# Patient Record
Sex: Female | Born: 1970 | Race: Black or African American | Hispanic: No | Marital: Single | Smoking: Former smoker
Health system: Southern US, Community
[De-identification: ages and names within clinical notes are randomized; demographics above are authoritative.]

## PROBLEM LIST (undated history)

## (undated) DIAGNOSIS — E559 Vitamin D deficiency, unspecified: Secondary | ICD-10-CM

## (undated) DIAGNOSIS — D250 Submucous leiomyoma of uterus: Secondary | ICD-10-CM

## (undated) DIAGNOSIS — E039 Hypothyroidism, unspecified: Secondary | ICD-10-CM

## (undated) DIAGNOSIS — D509 Iron deficiency anemia, unspecified: Secondary | ICD-10-CM

## (undated) DIAGNOSIS — M549 Dorsalgia, unspecified: Secondary | ICD-10-CM

## (undated) DIAGNOSIS — R030 Elevated blood-pressure reading, without diagnosis of hypertension: Secondary | ICD-10-CM

## (undated) DIAGNOSIS — F419 Anxiety disorder, unspecified: Secondary | ICD-10-CM

## (undated) DIAGNOSIS — Z9289 Personal history of other medical treatment: Secondary | ICD-10-CM

## (undated) DIAGNOSIS — E538 Deficiency of other specified B group vitamins: Secondary | ICD-10-CM

## (undated) DIAGNOSIS — M1990 Unspecified osteoarthritis, unspecified site: Secondary | ICD-10-CM

## (undated) DIAGNOSIS — D649 Anemia, unspecified: Secondary | ICD-10-CM

## (undated) HISTORY — DX: Vitamin D deficiency, unspecified: E55.9

## (undated) HISTORY — PX: WISDOM TOOTH EXTRACTION: SHX21

## (undated) HISTORY — DX: Iron deficiency anemia, unspecified: D50.9

## (undated) HISTORY — DX: Anemia, unspecified: D64.9

## (undated) HISTORY — DX: Deficiency of other specified B group vitamins: E53.8

## (undated) HISTORY — DX: Dorsalgia, unspecified: M54.9

## (undated) HISTORY — DX: Elevated blood-pressure reading, without diagnosis of hypertension: R03.0

## (undated) HISTORY — DX: Hypothyroidism, unspecified: E03.9

## (undated) HISTORY — DX: Unspecified osteoarthritis, unspecified site: M19.90

## (undated) HISTORY — PX: APPENDECTOMY: SHX54

## (undated) HISTORY — DX: Submucous leiomyoma of uterus: D25.0

---

## 1998-03-10 ENCOUNTER — Emergency Department (HOSPITAL_COMMUNITY): Admission: EM | Admit: 1998-03-10 | Discharge: 1998-03-10 | Payer: Self-pay | Admitting: Emergency Medicine

## 1999-05-28 ENCOUNTER — Emergency Department (HOSPITAL_COMMUNITY): Admission: EM | Admit: 1999-05-28 | Discharge: 1999-05-28 | Payer: Self-pay | Admitting: Emergency Medicine

## 1999-09-02 ENCOUNTER — Emergency Department (HOSPITAL_COMMUNITY): Admission: EM | Admit: 1999-09-02 | Discharge: 1999-09-02 | Payer: Self-pay | Admitting: Emergency Medicine

## 1999-09-02 ENCOUNTER — Encounter: Payer: Self-pay | Admitting: Emergency Medicine

## 1999-11-21 ENCOUNTER — Encounter (INDEPENDENT_AMBULATORY_CARE_PROVIDER_SITE_OTHER): Payer: Self-pay | Admitting: Specialist

## 1999-11-21 ENCOUNTER — Ambulatory Visit (HOSPITAL_COMMUNITY): Admission: AD | Admit: 1999-11-21 | Discharge: 1999-11-21 | Payer: Self-pay | Admitting: *Deleted

## 1999-12-14 ENCOUNTER — Other Ambulatory Visit: Admission: RE | Admit: 1999-12-14 | Discharge: 1999-12-14 | Payer: Self-pay | Admitting: *Deleted

## 2000-01-19 HISTORY — PX: TUBAL LIGATION: SHX77

## 2000-03-03 ENCOUNTER — Emergency Department (HOSPITAL_COMMUNITY): Admission: EM | Admit: 2000-03-03 | Discharge: 2000-03-03 | Payer: Self-pay

## 2000-04-18 ENCOUNTER — Other Ambulatory Visit: Admission: RE | Admit: 2000-04-18 | Discharge: 2000-04-18 | Payer: Self-pay | Admitting: Obstetrics and Gynecology

## 2000-05-24 ENCOUNTER — Inpatient Hospital Stay (HOSPITAL_COMMUNITY): Admission: AD | Admit: 2000-05-24 | Discharge: 2000-05-24 | Payer: Self-pay | Admitting: Obstetrics and Gynecology

## 2000-06-29 ENCOUNTER — Inpatient Hospital Stay (HOSPITAL_COMMUNITY): Admission: AD | Admit: 2000-06-29 | Discharge: 2000-06-29 | Payer: Self-pay | Admitting: Obstetrics and Gynecology

## 2000-07-18 ENCOUNTER — Ambulatory Visit (HOSPITAL_COMMUNITY): Admission: RE | Admit: 2000-07-18 | Discharge: 2000-07-18 | Payer: Self-pay | Admitting: Obstetrics and Gynecology

## 2000-07-18 ENCOUNTER — Encounter: Payer: Self-pay | Admitting: Obstetrics and Gynecology

## 2000-09-23 ENCOUNTER — Inpatient Hospital Stay (HOSPITAL_COMMUNITY): Admission: AD | Admit: 2000-09-23 | Discharge: 2000-09-23 | Payer: Self-pay | Admitting: Obstetrics and Gynecology

## 2000-11-16 ENCOUNTER — Inpatient Hospital Stay (HOSPITAL_COMMUNITY): Admission: AD | Admit: 2000-11-16 | Discharge: 2000-11-16 | Payer: Self-pay | Admitting: Obstetrics and Gynecology

## 2000-11-23 ENCOUNTER — Inpatient Hospital Stay (HOSPITAL_COMMUNITY): Admission: AD | Admit: 2000-11-23 | Discharge: 2000-11-26 | Payer: Self-pay | Admitting: Obstetrics and Gynecology

## 2000-11-23 ENCOUNTER — Encounter (INDEPENDENT_AMBULATORY_CARE_PROVIDER_SITE_OTHER): Payer: Self-pay | Admitting: Specialist

## 2000-11-28 ENCOUNTER — Inpatient Hospital Stay (HOSPITAL_COMMUNITY): Admission: AD | Admit: 2000-11-28 | Discharge: 2000-12-01 | Payer: Self-pay | Admitting: Obstetrics and Gynecology

## 2000-11-28 ENCOUNTER — Encounter: Payer: Self-pay | Admitting: Obstetrics and Gynecology

## 2000-12-01 ENCOUNTER — Encounter: Payer: Self-pay | Admitting: Obstetrics and Gynecology

## 2000-12-19 ENCOUNTER — Encounter: Payer: Self-pay | Admitting: Obstetrics and Gynecology

## 2000-12-19 ENCOUNTER — Ambulatory Visit (HOSPITAL_COMMUNITY): Admission: RE | Admit: 2000-12-19 | Discharge: 2000-12-19 | Payer: Self-pay | Admitting: Obstetrics and Gynecology

## 2001-02-21 ENCOUNTER — Encounter: Payer: Self-pay | Admitting: Obstetrics and Gynecology

## 2001-02-21 ENCOUNTER — Ambulatory Visit (HOSPITAL_COMMUNITY): Admission: RE | Admit: 2001-02-21 | Discharge: 2001-02-21 | Payer: Self-pay | Admitting: Obstetrics and Gynecology

## 2001-08-29 ENCOUNTER — Other Ambulatory Visit: Admission: RE | Admit: 2001-08-29 | Discharge: 2001-08-29 | Payer: Self-pay | Admitting: Obstetrics and Gynecology

## 2002-01-21 ENCOUNTER — Emergency Department (HOSPITAL_COMMUNITY): Admission: EM | Admit: 2002-01-21 | Discharge: 2002-01-21 | Payer: Self-pay | Admitting: Emergency Medicine

## 2002-01-21 ENCOUNTER — Encounter: Payer: Self-pay | Admitting: Emergency Medicine

## 2002-06-05 ENCOUNTER — Emergency Department (HOSPITAL_COMMUNITY): Admission: EM | Admit: 2002-06-05 | Discharge: 2002-06-05 | Payer: Self-pay | Admitting: Emergency Medicine

## 2002-09-05 ENCOUNTER — Other Ambulatory Visit: Admission: RE | Admit: 2002-09-05 | Discharge: 2002-09-05 | Payer: Self-pay | Admitting: Obstetrics and Gynecology

## 2002-11-25 ENCOUNTER — Emergency Department (HOSPITAL_COMMUNITY): Admission: EM | Admit: 2002-11-25 | Discharge: 2002-11-25 | Payer: Self-pay | Admitting: Emergency Medicine

## 2003-01-24 ENCOUNTER — Emergency Department (HOSPITAL_COMMUNITY): Admission: AD | Admit: 2003-01-24 | Discharge: 2003-01-24 | Payer: Self-pay | Admitting: Family Medicine

## 2003-10-23 ENCOUNTER — Emergency Department (HOSPITAL_COMMUNITY): Admission: EM | Admit: 2003-10-23 | Discharge: 2003-10-23 | Payer: Self-pay

## 2004-02-03 ENCOUNTER — Other Ambulatory Visit: Admission: RE | Admit: 2004-02-03 | Discharge: 2004-02-03 | Payer: Self-pay | Admitting: Obstetrics and Gynecology

## 2004-02-17 ENCOUNTER — Emergency Department (HOSPITAL_COMMUNITY): Admission: EM | Admit: 2004-02-17 | Discharge: 2004-02-17 | Payer: Self-pay | Admitting: Family Medicine

## 2005-03-01 ENCOUNTER — Emergency Department (HOSPITAL_COMMUNITY): Admission: EM | Admit: 2005-03-01 | Discharge: 2005-03-01 | Payer: Self-pay | Admitting: Family Medicine

## 2005-06-30 ENCOUNTER — Encounter: Payer: Self-pay | Admitting: Obstetrics and Gynecology

## 2005-06-30 ENCOUNTER — Ambulatory Visit: Payer: Self-pay | Admitting: Obstetrics and Gynecology

## 2006-09-03 ENCOUNTER — Emergency Department (HOSPITAL_COMMUNITY): Admission: EM | Admit: 2006-09-03 | Discharge: 2006-09-03 | Payer: Self-pay | Admitting: Family Medicine

## 2007-05-08 ENCOUNTER — Emergency Department (HOSPITAL_COMMUNITY): Admission: EM | Admit: 2007-05-08 | Discharge: 2007-05-08 | Payer: Self-pay | Admitting: Emergency Medicine

## 2007-09-13 ENCOUNTER — Emergency Department (HOSPITAL_COMMUNITY): Admission: EM | Admit: 2007-09-13 | Discharge: 2007-09-13 | Payer: Self-pay | Admitting: Emergency Medicine

## 2008-04-17 ENCOUNTER — Emergency Department (HOSPITAL_COMMUNITY): Admission: EM | Admit: 2008-04-17 | Discharge: 2008-04-17 | Payer: Self-pay | Admitting: Family Medicine

## 2008-04-18 ENCOUNTER — Emergency Department (HOSPITAL_COMMUNITY): Admission: EM | Admit: 2008-04-18 | Discharge: 2008-04-19 | Payer: Self-pay | Admitting: Emergency Medicine

## 2008-04-22 ENCOUNTER — Ambulatory Visit (HOSPITAL_COMMUNITY): Admission: AD | Admit: 2008-04-22 | Discharge: 2008-04-22 | Payer: Self-pay | Admitting: Otolaryngology

## 2009-01-27 ENCOUNTER — Emergency Department (HOSPITAL_COMMUNITY): Admission: EM | Admit: 2009-01-27 | Discharge: 2009-01-28 | Payer: Self-pay | Admitting: Emergency Medicine

## 2009-02-26 ENCOUNTER — Ambulatory Visit: Payer: Self-pay | Admitting: Obstetrics and Gynecology

## 2009-03-03 ENCOUNTER — Encounter: Admission: RE | Admit: 2009-03-03 | Discharge: 2009-03-03 | Payer: Self-pay | Admitting: Obstetrics & Gynecology

## 2009-10-09 ENCOUNTER — Emergency Department (HOSPITAL_COMMUNITY): Admission: EM | Admit: 2009-10-09 | Discharge: 2009-10-09 | Payer: Self-pay | Admitting: Emergency Medicine

## 2009-10-11 ENCOUNTER — Emergency Department (HOSPITAL_COMMUNITY): Admission: EM | Admit: 2009-10-11 | Discharge: 2009-10-11 | Payer: Self-pay | Admitting: Emergency Medicine

## 2009-11-20 ENCOUNTER — Emergency Department (HOSPITAL_COMMUNITY): Admission: EM | Admit: 2009-11-20 | Discharge: 2009-11-20 | Payer: Self-pay | Admitting: Family Medicine

## 2009-12-13 ENCOUNTER — Emergency Department (HOSPITAL_COMMUNITY): Admission: EM | Admit: 2009-12-13 | Discharge: 2009-12-13 | Payer: Self-pay | Admitting: Emergency Medicine

## 2009-12-25 ENCOUNTER — Inpatient Hospital Stay (HOSPITAL_COMMUNITY): Admission: AD | Admit: 2009-12-25 | Discharge: 2009-09-20 | Payer: Self-pay | Admitting: Obstetrics and Gynecology

## 2010-03-31 LAB — CULTURE, ROUTINE-ABSCESS

## 2010-04-02 LAB — CBC
Hemoglobin: 9.7 g/dL — ABNORMAL LOW (ref 12.0–15.0)
MCV: 73.2 fL — ABNORMAL LOW (ref 78.0–100.0)
Platelets: 216 10*3/uL (ref 150–400)
WBC: 5.2 10*3/uL (ref 4.0–10.5)

## 2010-04-02 LAB — URINALYSIS, ROUTINE W REFLEX MICROSCOPIC
Glucose, UA: NEGATIVE mg/dL
Hgb urine dipstick: NEGATIVE
Nitrite: NEGATIVE
Protein, ur: NEGATIVE mg/dL
Specific Gravity, Urine: >1.03 — ABNORMAL HIGH (ref 1.005–1.030)
pH: 6 (ref 5.0–8.0)

## 2010-04-02 LAB — CULTURE, ROUTINE-ABSCESS

## 2010-04-02 LAB — WET PREP, GENITAL: Yeast Wet Prep HPF POC: NONE SEEN

## 2010-04-02 LAB — URINE MICROSCOPIC-ADD ON

## 2010-04-02 LAB — GC/CHLAMYDIA PROBE AMP, GENITAL
Chlamydia, DNA Probe: NEGATIVE
GC Probe Amp, Genital: NEGATIVE

## 2010-04-05 LAB — CBC
Platelets: 239 10*3/uL (ref 150–400)
RBC: 4.72 MIL/uL (ref 3.87–5.11)
RDW: 13.3 % (ref 11.5–15.5)
WBC: 6.9 10*3/uL (ref 4.0–10.5)

## 2010-04-05 LAB — COMPREHENSIVE METABOLIC PANEL
Albumin: 3.7 g/dL (ref 3.5–5.2)
Alkaline Phosphatase: 90 U/L (ref 39–117)
CO2: 23 mEq/L (ref 19–32)
Calcium: 8.8 mg/dL (ref 8.4–10.5)
Creatinine, Ser: 0.5 mg/dL (ref 0.4–1.2)
GFR calc Af Amer: >60 mL/min (ref >60)
GFR calc non Af Amer: >60 mL/min (ref >60)
Potassium: 3.7 mEq/L (ref 3.5–5.1)
Sodium: 136 mEq/L (ref 135–145)

## 2010-04-05 LAB — URINALYSIS, ROUTINE W REFLEX MICROSCOPIC
Glucose, UA: NEGATIVE mg/dL
Protein, ur: NEGATIVE mg/dL
Urobilinogen, UA: 0.2 mg/dL (ref 0.0–1.0)

## 2010-04-05 LAB — PREGNANCY, URINE: Preg Test, Ur: NEGATIVE

## 2010-04-05 LAB — DIFFERENTIAL
Basophils Absolute: 0 10*3/uL (ref 0.0–0.1)
Basophils Relative: 0 % (ref 0–1)
Eosinophils Absolute: 0 10*3/uL (ref 0.0–0.7)
Lymphocytes Relative: 6 % — ABNORMAL LOW (ref 12–46)
Monocytes Relative: 5 % (ref 3–12)
Neutrophils Relative %: 89 % — ABNORMAL HIGH (ref 43–77)

## 2010-04-29 LAB — CBC
HCT: 38.8 % (ref 36.0–46.0)
Hemoglobin: 12.3 g/dL (ref 12.0–15.0)
MCHC: 34.7 g/dL (ref 30.0–36.0)
MCV: 76.6 fL — ABNORMAL LOW (ref 78.0–100.0)
Platelets: 234 10*3/uL (ref 150–400)
RDW: 12.6 % (ref 11.5–15.5)
RDW: 13.1 % (ref 11.5–15.5)
WBC: 14.6 10*3/uL — ABNORMAL HIGH (ref 4.0–10.5)

## 2010-04-29 LAB — POCT I-STAT, CHEM 8
Calcium, Ion: 1.1 mmol/L — ABNORMAL LOW (ref 1.12–1.32)
Glucose, Bld: 111 mg/dL — ABNORMAL HIGH (ref 70–99)
HCT: 40 % (ref 36.0–46.0)
Hemoglobin: 13.6 g/dL (ref 12.0–15.0)
Potassium: 3.1 mEq/L — ABNORMAL LOW (ref 3.5–5.1)
Sodium: 137 mEq/L (ref 135–145)

## 2010-04-29 LAB — DIFFERENTIAL
Basophils Relative: 0 % (ref 0–1)
Eosinophils Absolute: 0 10*3/uL (ref 0.0–0.7)
Lymphocytes Relative: 7 % — ABNORMAL LOW (ref 12–46)
Monocytes Relative: 17 % — ABNORMAL HIGH (ref 3–12)
Neutro Abs: 11.2 10*3/uL — ABNORMAL HIGH (ref 1.7–7.7)

## 2010-04-30 LAB — POCT RAPID STREP A (OFFICE): Streptococcus, Group A Screen (Direct): POSITIVE — AB

## 2010-06-02 NOTE — Op Note (Signed)
[name], [name]               ACCOUNT NO.:  [id number]   MEDICAL RECORD NO.:  [id number]          PATIENT TYPE:  AMB   LOCATION:  SDS                          FACILITY:  [facility]   PHYSICIAN:  Suzanna Obey, M.D.       DATE OF BIRTH:  1970/12/15   DATE OF PROCEDURE:  04/22/2008  DATE OF DISCHARGE:                               OPERATIVE REPORT   PREOPERATIVE DIAGNOSIS:  Right peritonsillar abscess.   POSTOPERATIVE DIAGNOSIS:  Right peritonsillar abscess.   SURGICAL PROCEDURE:  Incision and drainage of right peritonsillar  abscess.   ANESTHESIA:  General.   ESTIMATED BLOOD LOSS:  Approximately 10 mL.   INDICATIONS FOR PROCEDURE:  This is a 40 year old who has had about a  week history of sore throat, has been refractory to medical therapy.  She has been on Augmentin and still having significant issues with  discomfort.  She was attempted to have this drained in the office, but  her gag reflux was far too sensitive and had to be aborted and brought  to the operating room.  She was informed of the risks and benefits of  the procedure and options were discussed.  All questions were answered  and consent was obtained.   OPERATION:  The patient was taken to the operating room and placed in  supine position.  After general endotracheal tube anesthesia, was placed  in the Rose position, draped in the usual sterile manner.  A Crowe-Davis  mouth gag was inserted, retracted, and suspended from the Mayo stand.  The right tonsil had a fullness and was cut right at the upper portion  of the tonsil and the peritonsillar space with electrocautery and then  opened with a tonsillar hemostat.  A large amount of pus was expressed.  The wound was opened with the tonsillar hemostat and irrigated with  saline that was seemed to be completely decompressed.  The hypopharynx,  esophagus, and stomach were suctioned with an NG tube.  The Crowe-Davis  was released and the patient was awake and brought to  recovery room in  stable condition.  Counts were correct.           ______________________________  Suzanna Obey, M.D.     JB/MEDQ  D:  04/22/2008  T:  04/23/2008  Job:  [id number]

## 2010-06-05 NOTE — Group Therapy Note (Signed)
NAMEHAYLO, [name]               ACCOUNT NO.:  [id number]   MEDICAL RECORD NO.:  [id number]          PATIENT TYPE:  WOC   LOCATION:  [facility]                   FACILITY:  [facility]   PHYSICIAN:  Argentina Donovan, MD        DATE OF BIRTH:  05/13/1970   DATE OF SERVICE:                                    CLINIC NOTE   Patient is a 40 year old black female, gravida 3, para 2, 0-1-2, who has no  significant physical problems, is in good health, and is in purely for an  annual Pap smear.  All of her Pap smears in the past have been fine.   In her history, she has had appendicitis, two C-sections, and tubal  ligation.   PHYSICAL EXAMINATION:  ABDOMEN:  On examination, the abdomen is soft, flat,  and nontender.  No masses or organomegaly.  PELVIC:  External genitalia are normal.  BUS within normal limits.  The  vagina is clear and well rugated, pink.  The cervix is clean and  nulliparous.  The uterus is anterior and normal in size, shape, and  consistency.  Adnexa is normal.   A Pap smear was taken.   Patient has no family history of breast cancer.           ______________________________  Argentina Donovan, MD     PR/MEDQ  D:  06/30/2005  T:  06/30/2005  Job:  [id number]

## 2010-06-05 NOTE — Op Note (Signed)
[facility]  PatientNEIDA, ELLEGOOD Visit Number: [id number] MRN: [id number]          Service Type: OBS Location: 910A 9148 01 Attending Physician:  Leonard Schwartz Dictated by:   Janine Limbo, M.D. Proc. Date: 11/23/00 Admit Date:  11/23/2000                             Operative Report  PREOPERATIVE DIAGNOSES:       1. Term intrauterine pregnancy.                               2. Prior cesarean section.                               3. Desires repeat cesarean section.                               4. Desires permanent sterilization.  POSTOPERATIVE DIAGNOSES:      1. Term intrauterine pregnancy.                               2. Prior cesarean section.                               3. Desires repeat cesarean section.                               4. Desires permanent sterilization.                               5. Homero Fellers breech presentation.  PROCEDURE:                    Repeat low transverse cesarean section, bilateral tubal ligation.  SURGEON:                      Janine Limbo, M.D.  FIRST ASSISTANT:              Mack Guise, C.N.M.  ANESTHESIA:                   Spinal.  DISPOSITION:                  Ms. Cerino is a 40 year old female gravida 3, para 1-0-1-1 who presents at [redacted] weeks gestation [facility] December 08, 2000) for repeat cesarean section.  The patient understands the indications for her surgical procedure and she accepts the risks of, but not limited to, anesthetic complications, bleeding, infections, possible damage to the surrounding organs, and possible tubal failure (10-17 per 1000).  FINDINGS:                     A 7 pound 10 ounce female infant Ninfa Linden) was delivered from a complete breech presentation.  The Apgars were 7 at one minute and 9 at five minutes.  The uterus, fallopian tubes, and ovaries were normal for the gravid state.  PROCEDURE:  The patient was taken to the  operating room where a spinal anesthetic was given.  The patients abdomen and perineum were prepped with multiple layers of Betadine.  A Foley catheter was placed in the bladder.  The patient was sterilely draped.  A low transverse incision was made in the abdomen and carried sharply through the subcutaneous tissue, the fascia, and the anterior peritoneum.  An incision was made in the lower uterine segment and extended transversely.  The infant was delivered from a complete breech extraction without difficulty.  The mouth and nose were suctioned.  The cord was clamped and cut and the infant was handed to the awaiting pediatric team.  Routine cord blood studies were obtained.  The placenta was removed.  The uterine cavity was cleaned of amniotic fluid, clotted blood, and membranes.  The uterine incision was closed using a running locking suture of 2-0 Vicryl followed by figure-of-eight sutures of 2-0 Vicryl for hemostasis.  Hemostasis was adequate.  The pelvic cavity was vigorously irrigated.  The left fallopian tube was identified and followed to its fimbriated end.  A knuckle of tube was made on the left using a free tie and then a tied suture ligature of 0 plain catgut.  The knuckle of tube thus made was excised.  Hemostasis was adequate.  An identical procedure was carried out on the opposite side.  Again, hemostasis was adequate.  The anterior peritoneum and the abdominal musculature were then reapproximated in the midline using interrupted sutures of 2-0 Vicryl.  The fascia was closed using a running suture of 0 Vicryl followed by three interrupted sutures of 0 Vicryl.  The subcutaneous layer was irrigated.  Hemostasis was adequate.  The subcutaneous layer was closed using interrupted sutures of 0 Vicryl and 2-0 Vicryl.  The skin was reapproximated using skin staples.  Sponge, needle, and instrument counts were correct on two occasions.  The estimated blood loss for the procedure was  700 cc.  The patient tolerated her procedure well.  The patient was taken to the recovery room in stable condition.  The infant was taken to the full-term nursery in stable condition. Dictated by:   Janine Limbo, M.D. Attending Physician:  Leonard Schwartz DD:  11/23/00 TD:  11/24/00 Job: [phone] [id number]

## 2010-06-05 NOTE — Op Note (Signed)
[facility]  Patient:    [name], [name]                        MRN: [id number] Proc. Date: 11/21/99 Adm. Date:  [id number] Attending:  Pleas Koch                           Operative Report  PREOPERATIVE DIAGNOSES:       First trimester bleeding, missed abortion 6 weeks.  POSTOPERATIVE DIAGNOSES:      First trimester bleeding, missed abortion 6 weeks.  OPERATION PERFORMED:          Suction ______ and evacuation.  SURGEON:                      Georgina Peer, M.D.  ANESTHESIA:                   Monitored anesthesia care using 20 cc of 1% Xylocaine paracervical block.  ESTIMATED BLOOD LOSS:         Less than 25 cc.  COMPLICATIONS:                None.  FINDINGS:                     A 6 week anterior gravid uterus.  INDICATIONS:                  This is a 40 year old gravida 2, para 1 with a known plateauing hCGs, a uterus on ultrasound which had a 6 week sac with no pole.  Patient was scheduled for a D&E on November 5, however, began having bleeding and significant cramping on November 3 and presented to maternity admissions.  Beta hCG continued to plateau at 5300 and decision was made to proceed with D&E today.  DESCRIPTION OF PROCEDURE:     Patient was taken to the operating room.  She was given IV sedation, placed in the dorsal lithotomy position.  Prepped and draped in the normal sterile fashion.  Uterus was 6 weeks size, soft, and anterior but no adnexal masses.  Cervix was visualized with the speculum and injected with 20 cc of 1% Xylocaine plain in the paracervical fashion. Tenaculum was applied to the cervix and the cervix was progressively dilated to a 31 [place] with [employer] dilators.  A #10 curved suction curette was used to thoroughly evacuate the uterine cavity.  A small sharp curette confirmed that there was no residual tissue.  Bleeding was minimal.  Uterus was felt to normal size and well contracted.  Sponge, needle, and  instrument counts were correct at all times.  Patient was returned to the recovery area in stable condition. DD:  11/21/99 TD:  11/22/99 Job: [id number]

## 2010-06-05 NOTE — Discharge Summary (Signed)
[name]  PatientTRIVIA, [name] Visit Number: [id number] MRN: [id number]          Service Type: OBS Location: 910A 9148 01 Attending Physician:  Leonard Schwartz Dictated by:   Wynelle Bourgeois, CNM Admit Date:  11/23/2000 Discharge Date: 11/26/2000                             Discharge Summary  ADMISSION DIAGNOSES:          1. Term pregnancy.                               2. Previous cesarean section, desires repeat.                               3. Desires sterilization.                               4. Breech.  DISCHARGE DIAGNOSES:          1. Term pregnancy.                               2. Previous cesarean section, desires repeat.                               3. Desires sterilization.                               4. Breech.                               5. Status post repeat low transverse cesarean                                  section and bilateral tubal ligation for a                                  viable female infant named Ahmad,                                  7 pounds 10 ounces, Apgars 7 and 9.  HOSPITAL PROCEDURES:          1. Spinal anesthesia.                               2. Repeat low transverse cesarean section and                                  bilateral tubal ligation.  HOSPITAL COURSE:              The patient was admitted on November 23, 2000 for an elective repeat low transverse cesarean section and elective bilateral tubal ligation.  This procedure was performed  by Dr. Stefano Gaul and proceeded without complications with an EBL of 700 cc and infant was stable upon delivery and remained so throughout the hospital stay.  On postop day #1, the patient was doing well, ambulating without assistance, tolerating liquids, and passing flatus.  She was breast feeding well.  Vital signs were stable and she was afebrile.  Hemoglobin was 9.9 (previously 11.3).  Chest was clear.  Her heart rate was regular rate and  rhythm.  Breasts were soft and nontender. Abdomen was soft and appropriately tender.  Incision was clean, dry, and intact.  Extremities within normal limits and routine care was given.  Postop day #2, the patient remained stable and routine postop care continued.  There was a small amount of serosanguineous drainage from the left side of her incision but the incision was healing.  There was no evidence of induration or cellulitis noted.  Staples were intact.  On postop day #3, she was doing well. Her vital signs were stable.  Maximum temperature was 98.8.  Blood pressure 120/60.  Physical exam was within normal limits.  Her incision was clean and dry with no serosanguineous drainage noted and no erythema or induration of her incision was noted.  Lochia was small.  Extremities had trace to 1+ edema. She was breast feeding well and was deemed to have received the full benefit of her hospital stay and was discharged home.  DISCHARGE MEDICATIONS:        1. Motrin 600 mg p.o. q.6h. p.r.n.                               2. Tylox 1-2 p.o. q.3-4h. p.r.n.  DISCHARGE LABORATORY DATA:    White blood cell count 10.1, hemoglobin 9.9, hematocrit 27.4, platelets 175,000.  RPR nonreactive.  DISCHARGE INSTRUCTIONS:       Per [facility] handout.  DISCHARGE FOLLOWUP:           On Thursday or Friday of the coming week for staple removal and then at six weeks postpartum or p.r.n. as indicated. Dictated by:   Wynelle Bourgeois, CNM Attending Physician:  Leonard Schwartz DD:  11/26/00 TD:  11/28/00 Job: [id number]

## 2010-06-05 NOTE — Discharge Summary (Signed)
[facility]  PatientMARLIA, [name] Visit Number: [id number] MRN: [id number]          Service Type: MED Location: 9300 9325 [id number] Attending Physician:  Leonard Schwartz Dictated by:   Nigel Bridgeman, C.N.M. Admit Date:  11/28/2000 Discharge Date: 12/01/2000                             Discharge Summary  ADDENDUM  Prior to the patients discharge she had a re-evaluation of her temperature which was 100.5.  The decision was made to cancel her discharge and continue her admission for another 23 hours, and she will be re-evaluated at that time. Dictated by:   Nigel Bridgeman, C.N.M. Attending Physician:  Leonard Schwartz DD:  12/01/00 TD:  12/01/00 Job: [id number]
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
## 2010-06-05 NOTE — Discharge Summary (Signed)
[facility] of [facility]  PatientOMAYA, [name] Visit Number: [id number] MRN: [id number]          Service Type: MED Location: 9300 9325 01 Attending Physician:  Leonard Schwartz Dictated by:   Nigel Bridgeman, C.N.M. Admit Date:  11/28/2000 Disc. Date: 12/01/00                             Discharge Summary  ADMISSION DIAGNOSES: 1. Five days status post repeat low transverse cesarean section. 2. Fever of unknown origin. 3. Hematuria.  DISCHARGE DIAGNOSES: 1. Probable pneumonia. 2. Eight days postoperative cesarean section.  PROCEDURES:  Intravenous antibiotic therapy.  HOSPITAL COURSE:  [name] is a 40 year old, gravida 3, para 2-0-1-2, who was admitted on 11/28/00, at 5 days postoperative cesarean section with complaints of worsening shortness of breath, vomiting, dysuria, and chest pain.  Her pregnancy had been remarkable for repeat cesarean section on November 23, 2000, with tubal ligation, persistent nausea and vomiting with several MAU visits during the pregnancy, obesity.  On admission, CBC showed white blood cell count at 6.7, hemoglobin 9.6.  Urine showed 11 to 20 red blood cells, but no evidence of pyelonephritis.  Temperature on admission was 102.7, pulse 102, blood pressure was normal, respiratory rate 24, O2 saturation was 97% on room air.  There were decreased sounds in the lower lobes of her lungs.  Her incision was clean, dry, and intact.  She had mild to moderate left CVA tenderness.  She had greater than 80 ketones on a UA.  SGOT was 48, SGPT was 35.  The decision was made to admit her to third floor for 23 hour observation with orders for IV hydration and Unasyn 3 g IV.  Chest x-ray was also done which showed findings suggestive of mild interstitial edema. She was placed on Lasix and strict I&O with daily weights.  Over the next three days she lost approximately 10 pounds.  Her clinical status improved. Her shortness of breath  resolved.  She did have another spike of temperature early in the morning of hospital day #3.  Chest x-ray was repeated on 12/01/00, showing findings more suggestive of right lower lobe infiltrate with suspicion for pneumonia.  However, in light of her improving clinical status, her lack of fever for greater than 24 hours, and her weight loss, the decision was made to discharge her home.  This was in consult with Dr. Normand Sloop.  The decision was also made to remove her staples today.  She was given Rocephin 1 g IV, and placed on a Zithromax DosePak of 500 mg q.d. x 7 days.  She was then deemed to have received full benefit of her hospital stay and was discharged home.  DISCHARGE INSTRUCTIONS: 1. The patient is to increase her rest. 2. She is to push p.o. fluids. 3. She is to call for temperature greater than or equal to 100, shortness of    breath, or chest pain.  She is also asked to call for any heavy vaginal    bleeding or any kind of discharge or bleeding from her wound.  DISCHARGE MEDICATIONS:  Zithromax one tablet 500 mg p.o. q.d. x 7 days.  FOLLOWUP:  In one week on 12/08/00 at 11:15 a.m. with ______ followup. Dictated by:   Nigel Bridgeman, C.N.M. Attending Physician:  Leonard Schwartz DD:  12/01/00 TD:  12/01/00 Job: [id number]
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
## 2010-10-30 LAB — POCT RAPID STREP A: Streptococcus, Group A Screen (Direct): NEGATIVE

## 2011-02-19 DIAGNOSIS — Z9289 Personal history of other medical treatment: Secondary | ICD-10-CM

## 2011-02-19 HISTORY — DX: Personal history of other medical treatment: Z92.89

## 2011-02-21 ENCOUNTER — Emergency Department (HOSPITAL_COMMUNITY): Payer: Medicaid Other

## 2011-02-21 ENCOUNTER — Observation Stay (HOSPITAL_COMMUNITY): Payer: Medicaid Other

## 2011-02-21 ENCOUNTER — Other Ambulatory Visit: Payer: Self-pay

## 2011-02-21 ENCOUNTER — Encounter (HOSPITAL_COMMUNITY): Payer: Self-pay

## 2011-02-21 ENCOUNTER — Inpatient Hospital Stay (HOSPITAL_COMMUNITY)
Admission: EM | Admit: 2011-02-21 | Discharge: 2011-02-22 | DRG: 812 | Disposition: A | Discharge status: Home / Self Care | Payer: Medicaid Other | Attending: Internal Medicine | Admitting: Internal Medicine

## 2011-02-21 DIAGNOSIS — R0602 Shortness of breath: Secondary | ICD-10-CM

## 2011-02-21 DIAGNOSIS — R531 Weakness: Secondary | ICD-10-CM

## 2011-02-21 DIAGNOSIS — D509 Iron deficiency anemia, unspecified: Secondary | ICD-10-CM

## 2011-02-21 DIAGNOSIS — D62 Acute posthemorrhagic anemia: Principal | ICD-10-CM | POA: Diagnosis present

## 2011-02-21 DIAGNOSIS — D250 Submucous leiomyoma of uterus: Secondary | ICD-10-CM | POA: Diagnosis present

## 2011-02-21 DIAGNOSIS — N920 Excessive and frequent menstruation with regular cycle: Secondary | ICD-10-CM | POA: Diagnosis present

## 2011-02-21 LAB — URINALYSIS, ROUTINE W REFLEX MICROSCOPIC
Ketones, ur: NEGATIVE mg/dL
Leukocytes, UA: NEGATIVE
Nitrite: NEGATIVE
Specific Gravity, Urine: 1.028 (ref 1.005–1.030)
Urobilinogen, UA: 2 mg/dL — ABNORMAL HIGH (ref 0.0–1.0)
pH: 6.5 (ref 5.0–8.0)

## 2011-02-21 LAB — CBC
Hemoglobin: 6.1 g/dL — CL (ref 12.0–15.0)
RBC: 3.8 MIL/uL — ABNORMAL LOW (ref 3.87–5.11)

## 2011-02-21 LAB — DIFFERENTIAL
Basophils Relative: 2 % — ABNORMAL HIGH (ref 0–1)
Eosinophils Absolute: 0.2 10*3/uL (ref 0.0–0.7)
Eosinophils Relative: 4 % (ref 0–5)
Lymphocytes Relative: 43 % (ref 12–46)
Neutrophils Relative %: 39 % — ABNORMAL LOW (ref 43–77)

## 2011-02-21 LAB — GLUCOSE, CAPILLARY: Glucose-Capillary: 77 mg/dL (ref 70–99)

## 2011-02-21 LAB — BASIC METABOLIC PANEL
GFR calc Af Amer: >90 mL/min (ref >90)
GFR calc non Af Amer: >90 mL/min (ref >90)
Potassium: 3.6 mEq/L (ref 3.5–5.1)
Sodium: 139 mEq/L (ref 135–145)

## 2011-02-21 LAB — HEMOGLOBIN AND HEMATOCRIT, BLOOD
Hemoglobin: 6.1 g/dL — CL (ref 12.0–15.0)
Hemoglobin: 10 g/dL — ABNORMAL LOW (ref 12.0–15.0)

## 2011-02-21 LAB — URINE MICROSCOPIC-ADD ON

## 2011-02-21 MED ORDER — ZOLPIDEM TARTRATE 5 MG PO TABS: 5.0000 mg | ORAL_TABLET | Freq: Every evening | ORAL | Status: DC | PRN

## 2011-02-21 MED ORDER — ACETAMINOPHEN 325 MG PO TABS: 650.0000 mg | ORAL_TABLET | Freq: Four times a day (QID) | ORAL | Status: DC | PRN

## 2011-02-21 MED ORDER — HYDROMORPHONE HCL PF 1 MG/ML IJ SOLN: 0.5000 mg | INTRAMUSCULAR | Status: DC | PRN

## 2011-02-21 MED ORDER — FUROSEMIDE 10 MG/ML IJ SOLN
20.0000 mg | Freq: Once | INTRAMUSCULAR | Status: AC
  Administered 2011-02-21: 20 mg via INTRAVENOUS
  Filled 2011-02-21 (×2): qty 2

## 2011-02-21 MED ORDER — OXYCODONE HCL 5 MG PO TABS: 5.0000 mg | ORAL_TABLET | ORAL | Status: DC | PRN

## 2011-02-21 MED ORDER — ONDANSETRON HCL 4 MG/2ML IJ SOLN: 4.0000 mg | Freq: Four times a day (QID) | INTRAMUSCULAR | Status: DC | PRN

## 2011-02-21 MED ORDER — IOHEXOL 300 MG/ML  SOLN
100.0000 mL | Freq: Once | INTRAMUSCULAR | Status: AC | PRN
  Administered 2011-02-21: 100 mL via INTRAVENOUS

## 2011-02-21 MED ORDER — IBUPROFEN 400 MG PO TABS
400.0000 mg | ORAL_TABLET | Freq: Once | ORAL | Status: AC
  Administered 2011-02-21: 400 mg via ORAL
  Filled 2011-02-21: qty 1

## 2011-02-21 MED ORDER — SODIUM CHLORIDE 0.9 % IV SOLN: INTRAVENOUS | Status: AC

## 2011-02-21 MED ORDER — IBUPROFEN 400 MG PO TABS
400.0000 mg | ORAL_TABLET | Freq: Four times a day (QID) | ORAL | Status: DC | PRN
  Filled 2011-02-21: qty 1

## 2011-02-21 MED ORDER — ACETAMINOPHEN 650 MG RE SUPP: 650.0000 mg | Freq: Four times a day (QID) | RECTAL | Status: DC | PRN

## 2011-02-21 MED ORDER — PANTOPRAZOLE SODIUM 40 MG IV SOLR
40.0000 mg | Freq: Two times a day (BID) | INTRAVENOUS | Status: DC
  Administered 2011-02-21: 40 mg via INTRAVENOUS
  Filled 2011-02-21 (×2): qty 40

## 2011-02-21 MED ORDER — ONDANSETRON HCL 4 MG PO TABS: 4.0000 mg | ORAL_TABLET | Freq: Four times a day (QID) | ORAL | Status: DC | PRN

## 2011-02-21 MED ORDER — SODIUM CHLORIDE 0.9 % IV SOLN: INTRAVENOUS | Status: DC

## 2011-02-21 MED ORDER — FUROSEMIDE 10 MG/ML IJ SOLN
20.0000 mg | Freq: Once | INTRAMUSCULAR | Status: AC
  Administered 2011-02-21: 20 mg via INTRAVENOUS
  Filled 2011-02-21: qty 2

## 2011-02-21 MED ORDER — FERROUS GLUCONATE 324 (38 FE) MG PO TABS
324.0000 mg | ORAL_TABLET | Freq: Every day | ORAL | Status: DC
  Administered 2011-02-22: 324 mg via ORAL
  Filled 2011-02-21 (×2): qty 1

## 2011-02-21 MED ORDER — PANTOPRAZOLE SODIUM 40 MG PO TBEC
40.0000 mg | DELAYED_RELEASE_TABLET | Freq: Every day | ORAL | Status: DC
  Administered 2011-02-22: 40 mg via ORAL
  Filled 2011-02-21: qty 1

## 2011-02-21 MED ORDER — ALUM & MAG HYDROXIDE-SIMETH 200-200-20 MG/5ML PO SUSP
30.0000 mL | Freq: Four times a day (QID) | ORAL | Status: DC | PRN
  Filled 2011-02-21: qty 30

## 2011-02-21 MED ORDER — FUROSEMIDE 10 MG/ML IJ SOLN
20.0000 mg | Freq: Once | INTRAMUSCULAR | Status: DC
  Filled 2011-02-21: qty 2

## 2011-02-21 MED ORDER — MEDROXYPROGESTERONE ACETATE 10 MG PO TABS
10.0000 mg | ORAL_TABLET | Freq: Every day | ORAL | Status: DC
  Administered 2011-02-21 – 2011-02-22 (×2): 10 mg via ORAL
  Filled 2011-02-21 (×2): qty 1

## 2011-02-21 MED ORDER — SODIUM CHLORIDE 0.9 % IV SOLN
125.0000 mg | Freq: Once | INTRAVENOUS | Status: AC
  Administered 2011-02-21: 125 mg via INTRAVENOUS
  Filled 2011-02-21: qty 10

## 2011-02-21 NOTE — Progress Notes (Signed)
[name]  UUV:[id number]  DOB: 06/05/1970  DOA: 02/21/2011  PCP: No primary provider on file.  Subjective: Complaining of generalized weakness, menorrhagia  Objective: Weight change:   Intake/Output Summary (Last 24 hours) at 02/21/11 1411 Last data filed at 02/21/11 1400  Gross per 24 hour  Intake   1480 ml  Output    700 ml  Net    780 ml   Blood pressure 130/80, pulse 72, temperature 98.5 F (36.9 C), temperature source Oral, resp. rate 18, height 5\' 5"  (1.651 m), weight 97.6 kg (215 lb 2.7 oz), last menstrual period 02/21/2011, SpO2 100.00%.  Physical Exam: General: Alert and awake, oriented x3, not in any acute distress. HEENT: anicteric sclera, pupils reactive to light and accommodation, EOMI CVS: S1-S2 clear, no murmur rubs or gallops Chest: clear to auscultation bilaterally, no wheezing, rales or rhonchi Abdomen: soft nontender, nondistended, normal bowel sounds, no organomegaly Extremities: no cyanosis, clubbing or edema noted bilaterally Neuro: Cranial nerves II-XII intact, no focal neurological deficits  Lab Results: Basic Metabolic Panel:  Lab 02/21/11 4742  NA 139  K 3.6  CL 107  CO2 21  GLUCOSE 95  BUN 17  CREATININE 0.49*  CALCIUM 8.9  MG --  PHOS --   CBC:  Lab 02/21/11 0536 02/21/11 0230  WBC -- 4.2  NEUTROABS -- 1.6*  HGB 6.1* 6.1*  HCT 21.2* 21.9*  MCV -- 57.6*  PLT -- 183  CBG:  Lab 02/21/11 1152 02/21/11 0749  GLUCAP 77 89     Micro Results: No results found for this or any previous visit (from the past 240 hour(s)).  Studies/Results: Dg Chest 2 View  02/21/2011  *RADIOLOGY REPORT*  Clinical Data: Chest pain, shortness of breath  CHEST - 2 VIEW  Comparison: 10/23/2003  Findings: Lungs are clear. No pleural effusion or pneumothorax. The cardiomediastinal contours are within normal limits. The visualized bones and soft tissues are without significant appreciable abnormality.  IMPRESSION: No acute cardiopulmonary process.   Original Report Authenticated By: Waneta Martins, M.D.   Ct Angio Chest W/cm &/or Wo Cm  02/21/2011  *RADIOLOGY REPORT*  Clinical Data: Weakness, fatigue  CT ANGIOGRAPHY CHEST  Technique:  Multidetector CT imaging of the chest using the standard protocol during bolus administration of intravenous contrast. Multiplanar reconstructed images including MIPs were obtained and reviewed to evaluate the vascular anatomy.  Contrast: OMNIPAQUE IOHEXOL 300 MG/ML IV SOLN  Comparison: 02/21/2011 radiograph  Findings: No pulmonary arterial branch filling defect.  Normal caliber aorta.  Normal heart size.  No pleural or pericardial effusion.  No intrathoracic lymphadenopathy.  Tiny amount of anterior mediastinal soft tissue presumably represents residual thymic tissue.  Limited images through the upper abdomen demonstrate no acute abnormality.  Lungs are clear.  Central airways are patent.  No pneumothorax.  No acute osseous abnormality.  IMPRESSION: No pulmonary embolism or acute intrathoracic process identified.  Original Report Authenticated By: Waneta Martins, M.D.    Medications: Scheduled Meds:   . ferric gluconate (FERRLECIT/NULECIT) IV  125 mg Intravenous Once  . furosemide  20 mg Intravenous Once  . furosemide  20 mg Intravenous Once  . ibuprofen  400 mg Oral Once  . medroxyPROGESTERone  10 mg Oral Daily  . pantoprazole (PROTONIX) IV  40 mg Intravenous Q12H  . DISCONTD: furosemide  20 mg Intravenous Once   Continuous Infusions:   . sodium chloride       Assessment/Plan: Active Problems: Acute on chronic anemia: Likely secondary to menorrhagia  from her uterine fibroids(known history) - Patient receiving blood transfusion, total 3, will place on IV ferric gluconate x1, then by mouth iron - Obtain pelvic and transvaginal ultrasound, urine pregnancy test negative - Discussed with Dr. Su Hilt (OB/GYN) on-call, recommended to start Provera (medroxyprogesterone) 10 mg daily - Patient  will followup with Dr. Stefano Gaul outpatient in office after discharge  Abdominal cramps: Likely secondary to menorrhagia - Continue when necessary Motrin, Protonix  DVT Prophylaxis: SCDs  Code Status: FC  Disposition: Hopefully DC home in a.m.   LOS: 0 days   Toby Ayad M.D. [facility] 02/21/2011, 2:11 PM

## 2011-02-21 NOTE — ED Notes (Signed)
PT c/o SOB x 2 weeks, and intermittent CP x 3 weeks. Pt states that she also feels palpitations

## 2011-02-21 NOTE — H&P (Addendum)
DATE OF ADMISSION:  02/21/2011  PCP:  None GYN:  Dr. Kirkland Hun     Chief Complaint: Weakness   HPI: [name] is an 41 y.o. female who presents with complaints of SOB, and intermittent chest pain, and worsening fatique and weakness over the past 2 weeks.  She denies having nausea, vomiting, or diarrhea. She also denies having any fevers or chills.    In the ED she was found to have a Hemoglobin level of 6.1.  She reports having menorrhagia X 10 years since her tubal ligation.  She reports and  her menses is regular and lasts 3 days and the second day is the heaviest.  She does report having uterine fibroids and states she was told by her GYN Dr Stefano Gaul on her evaluation  that they were small.  She denies having any hematemesis, hematochezia, or melena passage.  Transfusions were ordered.    History reviewed. No pertinent past medical history.  Past Surgical History  Procedure Date  . Cesarean section   . Appendectomy     Medications:  HOME MEDS: Prior to Admission medications   Medication Sig Start Date End Date Taking? Authorizing Provider  ibuprofen (ADVIL,MOTRIN) 200 MG tablet Take 400 mg by mouth once. For pain   Yes Historical Provider, MD    Allergies:  No Known Allergies  Social History:   reports that she quit smoking about 4 weeks ago. She does not have any smokeless tobacco history on file. She reports that she drinks alcohol. She reports that she does not use illicit drugs.  Family History: No family history on file.  Review of Systems:  She reports having fatigue, generalized weakness, SOB, Chest Pain, and dyspnea on exertion, and nausea, and menorrhagia.   The patient denies anorexia, fever, weight loss, vision loss, decreased hearing, hoarseness,  syncope, peripheral edema, balance deficits, hemoptysis, abdominal pain, melena, hematochezia, severe indigestion/heartburn, hematuria, incontinence, genital sores, suspicious skin lesions, transient  blindness, difficulty walking, depression, unusual weight change, enlarged lymph nodes, angioedema, and breast masses.   Physical Exam:  GEN: Pleasant Obese 41 year old [place] American female examined  and in no acute distress; cooperative with exam Filed Vitals:   02/21/11 0151 02/21/11 0353  BP: 123/75 110/55  Pulse: 76 74  Temp: 98.2 F (36.8 C) 98.3 F (36.8 C)  TempSrc: Oral Oral  Resp: 20 16  SpO2: 100% 100%   Blood pressure 110/55, pulse 74, temperature 98.3 F (36.8 C), temperature source Oral, resp. rate 16, last menstrual period 02/21/2011, SpO2 100.00%. PSYCH: She is alert and oriented x4; does not appear anxious does not appear depressed; affect is normal HEENT: Normocephalic and Atraumatic, Mucous membranes pink; PERRLA; EOM intact; Fundi:  Benign;  No scleral icterus, Nares: Patent, Oropharynx: Clear, Fair Dentition, Neck:  FROM, no cervical lymphadenopathy nor thyromegaly or carotid bruit; no JVD; Breasts:: Not examined CHEST WALL: No tenderness CHEST: Normal respiration, clear to auscultation bilaterally HEART: Regular rate and rhythm; no murmurs rubs or gallops BACK: No kyphosis or scoliosis; no CVA tenderness ABDOMEN: Positive Bowel Sounds, Obese, soft non-tender; no masses, no organomegaly, no pannus; no intertriginous candida. Rectal Exam: Not done EXTREMITIES: No cyanosis, clubbing or edema; no ulcerations. Genitalia: not examined PULSES: 2+ and symmetric SKIN: Normal hydration no rash or ulceration CNS: Cranial nerves 2-12 grossly intact no focal neurologic deficit   Labs & Imaging Results for orders placed during the hospital encounter of 02/21/11 (from the past 48 hour(s))  BASIC METABOLIC PANEL  Status: Abnormal   Collection Time   02/21/11  2:30 AM      Component Value Range Comment   Sodium 139  135 - 145 (mEq/L)    Potassium 3.6  3.5 - 5.1 (mEq/L)    Chloride 107  96 - 112 (mEq/L)    CO2 21  19 - 32 (mEq/L)    Glucose, Bld 95  70 - 99 (mg/dL)      BUN 17  6 - 23 (mg/dL)    Creatinine, Ser 1.61 (*) 0.50 - 1.10 (mg/dL)    Calcium 8.9  8.4 - 10.5 (mg/dL)    GFR calc non Af Amer >90  >90 (mL/min)    GFR calc Af Amer >90  >90 (mL/min)   CBC     Status: Abnormal   Collection Time   02/21/11  2:30 AM      Component Value Range Comment   WBC 4.2  4.0 - 10.5 (K/uL)    RBC 3.80 (*) 3.87 - 5.11 (MIL/uL)    Hemoglobin 6.1 (*) 12.0 - 15.0 (g/dL)    HCT 09.6 (*) 04.5 - 46.0 (%)    MCV 57.6 (*) 78.0 - 100.0 (fL)    MCH 16.1 (*) 26.0 - 34.0 (pg)    MCHC 27.9 (*) 30.0 - 36.0 (g/dL)    RDW 40.9 (*) 81.1 - 15.5 (%)    Platelets 183  150 - 400 (K/uL) PLATELET COUNT CONFIRMED BY SMEAR  DIFFERENTIAL     Status: Abnormal   Collection Time   02/21/11  2:30 AM      Component Value Range Comment   Neutrophils Relative 39 (*) 43 - 77 (%)    Lymphocytes Relative 43  12 - 46 (%)    Monocytes Relative 12  3 - 12 (%)    Eosinophils Relative 4  0 - 5 (%)    Basophils Relative 2 (*) 0 - 1 (%)    Neutro Abs 1.6 (*) 1.7 - 7.7 (K/uL)    Lymphs Abs 1.8  0.7 - 4.0 (K/uL)    Monocytes Absolute 0.5  0.1 - 1.0 (K/uL)    Eosinophils Absolute 0.2  0.0 - 0.7 (K/uL)    Basophils Absolute 0.1  0.0 - 0.1 (K/uL)    RBC Morphology ELLIPTOCYTES      Smear Review LARGE PLATELETS PRESENT     URINALYSIS, ROUTINE W REFLEX MICROSCOPIC     Status: Abnormal   Collection Time   02/21/11  2:30 AM      Component Value Range Comment   Color, Urine YELLOW  YELLOW     APPearance CLEAR  CLEAR     Specific Gravity, Urine 1.028  1.005 - 1.030     pH 6.5  5.0 - 8.0     Glucose, UA NEGATIVE  NEGATIVE (mg/dL)    Hgb urine dipstick TRACE (*) NEGATIVE     Bilirubin Urine NEGATIVE  NEGATIVE     Ketones, ur NEGATIVE  NEGATIVE (mg/dL)    Protein, ur NEGATIVE  NEGATIVE (mg/dL)    Urobilinogen, UA 2.0 (*) 0.0 - 1.0 (mg/dL)    Nitrite NEGATIVE  NEGATIVE     Leukocytes, UA NEGATIVE  NEGATIVE    PREGNANCY, URINE     Status: Normal   Collection Time   02/21/11  2:30 AM      Component Value  Range Comment   Preg Test, Ur NEGATIVE  NEGATIVE    D-DIMER, QUANTITATIVE     Status: Abnormal   Collection Time  02/21/11  2:30 AM      Component Value Range Comment   D-Dimer, Quant 0.70 (*) 0.00 - 0.48 (ug/mL-FEU)   URINE MICROSCOPIC-ADD ON     Status: Normal   Collection Time   02/21/11  2:30 AM      Component Value Range Comment   Squamous Epithelial / LPF RARE  RARE     WBC, UA 0-2  <3 (WBC/hpf)    RBC / HPF 3-6  <3 (RBC/hpf)    Bacteria, UA RARE  RARE     Urine-Other MUCOUS PRESENT      Dg Chest 2 View  02/21/2011  *RADIOLOGY REPORT*  Clinical Data: Chest pain, shortness of breath  CHEST - 2 VIEW  Comparison: 10/23/2003  Findings: Lungs are clear. No pleural effusion or pneumothorax. The cardiomediastinal contours are within normal limits. The visualized bones and soft tissues are without significant appreciable abnormality.  IMPRESSION: No acute cardiopulmonary process.  Original Report Authenticated By: Waneta Martins, M.D.   Ct Angio Chest W/cm &/or Wo Cm  02/21/2011  *RADIOLOGY REPORT*  Clinical Data: Weakness, fatigue  CT ANGIOGRAPHY CHEST  Technique:  Multidetector CT imaging of the chest using the standard protocol during bolus administration of intravenous contrast. Multiplanar reconstructed images including MIPs were obtained and reviewed to evaluate the vascular anatomy.  Contrast: OMNIPAQUE IOHEXOL 300 MG/ML IV SOLN  Comparison: 02/21/2011 radiograph  Findings: No pulmonary arterial branch filling defect.  Normal caliber aorta.  Normal heart size.  No pleural or pericardial effusion.  No intrathoracic lymphadenopathy.  Tiny amount of anterior mediastinal soft tissue presumably represents residual thymic tissue.  Limited images through the upper abdomen demonstrate no acute abnormality.  Lungs are clear.  Central airways are patent.  No pneumothorax.  No acute osseous abnormality.  IMPRESSION: No pulmonary embolism or acute intrathoracic process identified.  Original Report  Authenticated By: Waneta Martins, M.D.      Assessment:  1.  Symptomatic Anemia 2.  SOB 3.  Generalized Weakness 4.  Menorrhagia 5.  Uterine Fibroids    Plan:      Transfuse aTotal of 3 units Heme Test Stools H/H checks q 8 hrs X 48 hours IV Protonix q 12 hours IVFs GYN Consult and possible GI Consult may be needed SCDs for DVT prophylaxis Other plans as per orders.    CODE STATUS:      FULL CODE       Addilynne Olheiser C 02/21/2011, 5:39 AM   `

## 2011-02-21 NOTE — ED Provider Notes (Signed)
History     CSN: [id number]  Arrival date & time 02/21/11  0142   First MD Initiated Contact with Patient 02/21/11 0147      Chief Complaint  Patient presents with  . Shortness of Breath    with exertion. Pt also c/o CP    (Consider location/radiation/quality/duration/timing/severity/associated sxs/prior treatment) HPI [name] is a 41 y.o. female presents with c/o intermittent chest pain, and shortness of breath for one week leading to desire to be assessed in the ED. The sx(s) have been present for several weeks. Additional concerns are insomnia and nausea. Causative factors are not known. Palliative factors are that he has been tried. The distress associated is mild. The disorder has been present for 1 week. The symptoms have not prevented her from working. She denies weakness, dizziness, headache, or trouble walking.    History reviewed. No pertinent past medical history.  Past Surgical History  Procedure Date  . Cesarean section   . Appendectomy   . Tubal ligation     No family history on file.  History  Substance Use Topics  . Smoking status: Former Smoker    Quit date: 01/21/2011  . Smokeless tobacco: Not on file  . Alcohol Use: No    OB History    Grav Para Term Preterm Abortions TAB SAB Ect Mult Living                  Review of Systems  All other systems reviewed and are negative.    Allergies  Review of patient's allergies indicates no known allergies.  Home Medications  No current outpatient prescriptions on file.  BP 127/79  Pulse 77  Temp(Src) 97.8 F (36.6 C) (Oral)  Resp 20  Ht 5\' 5"  (1.651 m)  Wt 215 lb 2.7 oz (97.6 kg)  BMI 35.81 kg/m2  SpO2 100%  LMP 02/21/2011  Physical Exam  Nursing note and vitals reviewed. Constitutional: She is oriented to person, place, and time. She appears well-developed and well-nourished.  HENT:  Head: Normocephalic and atraumatic.  Eyes: Conjunctivae and EOM are normal. Pupils are equal,  round, and reactive to light.  Neck: Normal range of motion and phonation normal. Neck supple.  Cardiovascular: Normal rate, regular rhythm and intact distal pulses.   Pulmonary/Chest: Effort normal and breath sounds normal. She exhibits no tenderness.  Abdominal: Soft. She exhibits no distension. There is no tenderness. There is no guarding.  Musculoskeletal: Normal range of motion.  Neurological: She is alert and oriented to person, place, and time. She has normal strength. She exhibits normal muscle tone.  Skin: Skin is warm and dry.  Psychiatric: Her behavior is normal. Judgment and thought content normal.       She is anxious    ED Course  Procedures (including critical care time)   Date: 02/21/2011  Rate: 79  Rhythm: normal sinus rhythm  QRS Axis: normal  Intervals: normal  ST/T Wave abnormalities: normal  Conduction Disutrbances:none  Narrative Interpretation:   Old EKG Reviewed: unchanged  6:21 PM Reevaluation with update and discussion. After initial assessment and treatment, an updated evaluation reveals the patient remains comfortable. Repeat vital signs, and oxygen saturation are normal. Patient denies known blood loss. She states that she typically has one day of menstrual bleeding every month. She has not been vomiting blood or passing blood in the stool. [name]   Labs Reviewed  BASIC METABOLIC PANEL - Abnormal; Notable for the following:    Creatinine, Ser 0.49 (*)  All other components within normal limits  CBC - Abnormal; Notable for the following:    RBC 3.80 (*)    Hemoglobin 6.1 (*)    HCT 21.9 (*)    MCV 57.6 (*)    MCH 16.1 (*)    MCHC 27.9 (*)    RDW 18.7 (*)    All other components within normal limits  DIFFERENTIAL - Abnormal; Notable for the following:    Neutrophils Relative 39 (*)    Basophils Relative 2 (*)    Neutro Abs 1.6 (*)    All other components within normal limits  URINALYSIS, ROUTINE W REFLEX MICROSCOPIC - Abnormal;  Notable for the following:    Hgb urine dipstick TRACE (*)    Urobilinogen, UA 2.0 (*)    All other components within normal limits  D-DIMER, QUANTITATIVE - Abnormal; Notable for the following:    D-Dimer, Quant 0.70 (*)    All other components within normal limits  HEMOGLOBIN AND HEMATOCRIT, BLOOD - Abnormal; Notable for the following:    Hemoglobin 6.1 (*) CRITICAL VALUE NOTED.  VALUE IS CONSISTENT WITH PREVIOUSLY REPORTED AND CALLED VALUE.   HCT 21.2 (*)    All other components within normal limits  PREGNANCY, URINE  URINE MICROSCOPIC-ADD ON  PREPARE RBC (CROSSMATCH)  TYPE AND SCREEN  ABO/RH  GLUCOSE, CAPILLARY  GLUCOSE, CAPILLARY  GLUCOSE, CAPILLARY  HEMOGLOBIN AND HEMATOCRIT, BLOOD  HEMOGLOBIN AND HEMATOCRIT, BLOOD  BASIC METABOLIC PANEL  CBC   Dg Chest 2 View  02/21/2011  *RADIOLOGY REPORT*  Clinical Data: Chest pain, shortness of breath  CHEST - 2 VIEW  Comparison: 10/23/2003  Findings: Lungs are clear. No pleural effusion or pneumothorax. The cardiomediastinal contours are within normal limits. The visualized bones and soft tissues are without significant appreciable abnormality.  IMPRESSION: No acute cardiopulmonary process.  Original Report Authenticated By: Waneta Martins, M.D.   Ct Angio Chest W/cm &/or Wo Cm  02/21/2011  *RADIOLOGY REPORT*  Clinical Data: Weakness, fatigue  CT ANGIOGRAPHY CHEST  Technique:  Multidetector CT imaging of the chest using the standard protocol during bolus administration of intravenous contrast. Multiplanar reconstructed images including MIPs were obtained and reviewed to evaluate the vascular anatomy.  Contrast: OMNIPAQUE IOHEXOL 300 MG/ML IV SOLN  Comparison: 02/21/2011 radiograph  Findings: No pulmonary arterial branch filling defect.  Normal caliber aorta.  Normal heart size.  No pleural or pericardial effusion.  No intrathoracic lymphadenopathy.  Tiny amount of anterior mediastinal soft tissue presumably represents residual thymic  tissue.  Limited images through the upper abdomen demonstrate no acute abnormality.  Lungs are clear.  Central airways are patent.  No pneumothorax.  No acute osseous abnormality.  IMPRESSION: No pulmonary embolism or acute intrathoracic process identified.  Original Report Authenticated By: Waneta Martins, M.D.   US Transvaginal Non-ob  02/21/2011  *RADIOLOGY REPORT*  Clinical Data: Menorrhagia.  Fibroids.  Severe anemia.  TRANSABDOMINAL AND TRANSVAGINAL ULTRASOUND OF PELVIS  Technique:  Both transabdominal and transvaginal ultrasound examinations of the pelvis were performed.  Transabdominal technique was performed for global imaging of the pelvis including uterus, ovaries, adnexal regions, and pelvic cul-de-sac.  It was necessary to proceed with endovaginal exam following the transabdominal exam to visualize the small fibroids, endometrial stripe, and ovaries.  Comparison:  09/20/2009  Findings: Uterus:  8.1 x 5.2 x 6.0 cm.  Several small fibroids are again seen within the uterine corpus and fundus, which range in size from 1.5 cm to 1.8 cm in maximum diameter.  The  1.8 cm fibroid in the posterior corpus submucosal location, displacing the endometrial cavity anteriorly.  These fibroids show no significant change in size compared with prior exam.  Endometrium: Double layer thickness measures 6 mm transvaginally. Displaced anteriorly by posterior submucosal fibroid described above.  Right ovary: Normal appearance/no adnexal mass  Left ovary: Normal appearance/no adnexal mass  Other Findings:  No free fluid  IMPRESSION:  1.  Stable small uterine fibroids.  Largest 1.8 cm fibroid is submucosal in location. Endometrial thickness measures 6 mm. 2.  Normal ovaries.  No evidence of adnexal mass  Original Report Authenticated By: Danae Orleans, M.D.   US Pelvis Complete  02/21/2011  *RADIOLOGY REPORT*  Clinical Data: Menorrhagia.  Fibroids.  Severe anemia.  TRANSABDOMINAL AND TRANSVAGINAL ULTRASOUND OF PELVIS   Technique:  Both transabdominal and transvaginal ultrasound examinations of the pelvis were performed.  Transabdominal technique was performed for global imaging of the pelvis including uterus, ovaries, adnexal regions, and pelvic cul-de-sac.  It was necessary to proceed with endovaginal exam following the transabdominal exam to visualize the small fibroids, endometrial stripe, and ovaries.  Comparison:  09/20/2009  Findings: Uterus:  8.1 x 5.2 x 6.0 cm.  Several small fibroids are again seen within the uterine corpus and fundus, which range in size from 1.5 cm to 1.8 cm in maximum diameter.  The 1.8 cm fibroid in the posterior corpus submucosal location, displacing the endometrial cavity anteriorly.  These fibroids show no significant change in size compared with prior exam.  Endometrium: Double layer thickness measures 6 mm transvaginally. Displaced anteriorly by posterior submucosal fibroid described above.  Right ovary: Normal appearance/no adnexal mass  Left ovary: Normal appearance/no adnexal mass  Other Findings:  No free fluid  IMPRESSION:  1.  Stable small uterine fibroids.  Largest 1.8 cm fibroid is submucosal in location. Endometrial thickness measures 6 mm. 2.  Normal ovaries.  No evidence of adnexal mass  Original Report Authenticated By: Danae Orleans, M.D.     1. Iron deficiency anemia   2. Weakness   3. Shortness of breath       MDM  Weakness and fatigue with shortness of breath. No apparent asthma, PE, metabolic instability. She has significant anemia with a very low MCV, indicating chronic blood loss. Patient has no known source of blood loss. She'll need to be admitted for transfusion and further evaluation.        Flint Melter, MD 02/21/11 [phone]

## 2011-02-21 NOTE — ED Notes (Signed)
Admitting MD Lovell Sheehan told pt has bed upstairs but no orders. MD states that she wants to see pt in ED

## 2011-02-21 NOTE — ED Notes (Signed)
Pt states that she has been having generalized weakness and fatigue for the past couple of days. Pt denies cough or fever or chills. Pt states that her SOB gets worse when she walks, pt denies increased pain when she takes a deep breath. Pt states that she has been nauseated but she is also about to start her period so she does not know if maybe her period is causing her to feel weak.

## 2011-02-22 LAB — TYPE AND SCREEN
Antibody Screen: NEGATIVE
Unit division: 0

## 2011-02-22 LAB — CBC
HCT: 30.5 % — ABNORMAL LOW (ref 36.0–46.0)
Hemoglobin: 9.6 g/dL — ABNORMAL LOW (ref 12.0–15.0)
MCV: 64.5 fL — ABNORMAL LOW (ref 78.0–100.0)
Platelets: 168 10*3/uL (ref 150–400)
RBC: 4.73 MIL/uL (ref 3.87–5.11)
WBC: 6 10*3/uL (ref 4.0–10.5)

## 2011-02-22 LAB — BASIC METABOLIC PANEL
CO2: 26 mEq/L (ref 19–32)
Chloride: 106 mEq/L (ref 96–112)
Potassium: 3.6 mEq/L (ref 3.5–5.1)
Sodium: 141 mEq/L (ref 135–145)

## 2011-02-22 LAB — GLUCOSE, CAPILLARY

## 2011-02-22 MED ORDER — MEDROXYPROGESTERONE ACETATE 10 MG PO TABS: 10.0000 mg | ORAL_TABLET | Freq: Every day | ORAL | Status: DC

## 2011-02-22 MED ORDER — FERROUS GLUCONATE 324 (38 FE) MG PO TABS: 324.0000 mg | ORAL_TABLET | Freq: Every day | ORAL | Status: DC

## 2011-02-22 NOTE — Discharge Summary (Signed)
Physician Discharge Summary  Patient ID: [name] MRN: [id number] DOB/AGE: 06/15/1970 41 y.o.  Admit date: 02/21/2011 Discharge date: 02/22/2011  Primary Care Physician:  No primary provider on file.  Discharge Diagnoses:   Acute blood loss anemia secondary to menorrhagia Uterine fibroids Dyspnea and atypical chest pain likely due to acute blood loss anemia: resolved  Consults: Dr. Su Hilt (OB/GYN) on phone  Discharge Medications: Medication List  As of 02/22/2011  4:58 PM   STOP taking these medications         ibuprofen 200 MG tablet         TAKE these medications         ferrous gluconate 324 MG tablet   Commonly known as: FERGON   Take 1 tablet (324 mg total) by mouth daily with breakfast.      medroxyPROGESTERone 10 MG tablet   Commonly known as: PROVERA   Take 1 tablet (10 mg total) by mouth daily.             Brief H and P: For complete details please refer to admission H and P, but in brief patient is a 41 year old female who presented with shortness of breath and intermittent chest pain, worsening fatigue and weakness over the past 2 weeks prior to admission. She denied having nausea vomiting or diarrhea, any fevers or chills. In the emergency room patient was found to have a hemoglobin level of 6.1. She reported having menorrhagia for 10 years and since her tubal ligation. She also reported having uterine fibroids. She denied any hematemesis, hematochezia or melena.  Hospital Course:   1) symptomatic acute blood loss anemia: Patient was admitted to the telemetry monitored floor, she was typed and screened and received 3 units of packed RBC transfusion. Patient was also given one unit of IV iron. Hemoglobin was 6.1 at the time of admission and improved to 9.6 at the time of discharge. Pelvic and transvaginal ultrasound was obtained that showed stable small uterine fibroids with the largest 1.8 cm fibroid is submucosal, endometrial thickness 6mm. Patient was also  started on medroxyprogesterone after phone consultation with Dr. Su Hilt (OB/GYN). The menorrhagia had somewhat improved at the time of discharge and patient will followup with Dr. Stefano Gaul outpatient within next 2 weeks. She was also given a prescription for iron supplementation. 2. atypical chest pain and dyspnea: Likely secondary to acute blood loss anemia, resolved, CT angiogram of the chest obtained showed no pulmonary embolism or acute intrathoracic process.  Day of Discharge BP 119/65  Pulse 62  Temp(Src) 98.4 F (36.9 C) (Oral)  Resp 20  Ht 5\' 5"  (1.651 m)  Wt 97.8 kg (215 lb 9.8 oz)  BMI 35.88 kg/m2  SpO2 100%  LMP 02/21/2011  Physical Exam: General: Alert and awake oriented x3 not in any acute distress. HEENT: anicteric sclera, pupils reactive to light and accommodation CVS: S1-S2 clear no murmur rubs or gallops Chest: clear to auscultation bilaterally, no wheezing rales or rhonchi Abdomen: soft nontender, nondistended, normal bowel sounds, no organomegaly Extremities: no cyanosis, clubbing or edema noted bilaterally Neuro: Cranial nerves II-XII intact, no focal neurological deficits   The results of significant diagnostics from this hospitalization (including imaging, microbiology, ancillary and laboratory) are listed below for reference.    LAB RESULTS: Basic Metabolic Panel:  Lab 02/22/11 4098 02/21/11 0230  NA 141 139  K 3.6 3.6  CL 106 107  CO2 26 21  GLUCOSE 90 95  BUN 14 17  CREATININE 0.55 0.49*  CALCIUM  9.3 8.9  MG -- --  PHOS -- --     Lab 02/22/11 0720 02/21/11 2207 02/21/11 0230  WBC 6.0 -- 4.2  NEUTROABS -- -- 1.6*  HGB 9.6* 10.0* --  HCT 30.5* 31.4* --  MCV 64.5* -- --  PLT 168 -- 183     Lab 02/22/11 0746 02/21/11 2129  GLUCAP 90 88    Significant Diagnostic Studies:  Dg Chest 2 View  02/21/2011  *RADIOLOGY REPORT*  Clinical Data: Chest pain, shortness of breath  CHEST - 2 VIEW  Comparison: 10/23/2003  Findings: Lungs are clear. No  pleural effusion or pneumothorax. The cardiomediastinal contours are within normal limits. The visualized bones and soft tissues are without significant appreciable abnormality.  IMPRESSION: No acute cardiopulmonary process.  Original Report Authenticated By: Waneta Martins, M.D.   Ct Angio Chest W/cm &/or Wo Cm  02/21/2011  *RADIOLOGY REPORT*  Clinical Data: Weakness, fatigue  CT ANGIOGRAPHY CHEST  Technique:  Multidetector CT imaging of the chest using the standard protocol during bolus administration of intravenous contrast. Multiplanar reconstructed images including MIPs were obtained and reviewed to evaluate the vascular anatomy.  Contrast: OMNIPAQUE IOHEXOL 300 MG/ML IV SOLN  Comparison: 02/21/2011 radiograph  Findings: No pulmonary arterial branch filling defect.  Normal caliber aorta.  Normal heart size.  No pleural or pericardial effusion.  No intrathoracic lymphadenopathy.  Tiny amount of anterior mediastinal soft tissue presumably represents residual thymic tissue.  Limited images through the upper abdomen demonstrate no acute abnormality.  Lungs are clear.  Central airways are patent.  No pneumothorax.  No acute osseous abnormality.  IMPRESSION: No pulmonary embolism or acute intrathoracic process identified.  Original Report Authenticated By: Waneta Martins, M.D.   US Transvaginal Non-ob  02/21/2011  *RADIOLOGY REPORT*  Clinical Data: Menorrhagia.  Fibroids.  Severe anemia.  TRANSABDOMINAL AND TRANSVAGINAL ULTRASOUND OF PELVIS  Technique:  Both transabdominal and transvaginal ultrasound examinations of the pelvis were performed.  Transabdominal technique was performed for global imaging of the pelvis including uterus, ovaries, adnexal regions, and pelvic cul-de-sac.  It was necessary to proceed with endovaginal exam following the transabdominal exam to visualize the small fibroids, endometrial stripe, and ovaries.  Comparison:  09/20/2009  Findings: Uterus:  8.1 x 5.2 x 6.0 cm.   Several small fibroids are again seen within the uterine corpus and fundus, which range in size from 1.5 cm to 1.8 cm in maximum diameter.  The 1.8 cm fibroid in the posterior corpus submucosal location, displacing the endometrial cavity anteriorly.  These fibroids show no significant change in size compared with prior exam.  Endometrium: Double layer thickness measures 6 mm transvaginally. Displaced anteriorly by posterior submucosal fibroid described above.  Right ovary: Normal appearance/no adnexal mass  Left ovary: Normal appearance/no adnexal mass  Other Findings:  No free fluid  IMPRESSION:  1.  Stable small uterine fibroids.  Largest 1.8 cm fibroid is submucosal in location. Endometrial thickness measures 6 mm. 2.  Normal ovaries.  No evidence of adnexal mass  Original Report Authenticated By: Danae Orleans, M.D.   US Pelvis Complete  02/21/2011  *RADIOLOGY REPORT*  Clinical Data: Menorrhagia.  Fibroids.  Severe anemia.  TRANSABDOMINAL AND TRANSVAGINAL ULTRASOUND OF PELVIS  Technique:  Both transabdominal and transvaginal ultrasound examinations of the pelvis were performed.  Transabdominal technique was performed for global imaging of the pelvis including uterus, ovaries, adnexal regions, and pelvic cul-de-sac.  It was necessary to proceed with endovaginal exam following the transabdominal exam to visualize  the small fibroids, endometrial stripe, and ovaries.  Comparison:  09/20/2009  Findings: Uterus:  8.1 x 5.2 x 6.0 cm.  Several small fibroids are again seen within the uterine corpus and fundus, which range in size from 1.5 cm to 1.8 cm in maximum diameter.  The 1.8 cm fibroid in the posterior corpus submucosal location, displacing the endometrial cavity anteriorly.  These fibroids show no significant change in size compared with prior exam.  Endometrium: Double layer thickness measures 6 mm transvaginally. Displaced anteriorly by posterior submucosal fibroid described above.  Right ovary: Normal  appearance/no adnexal mass  Left ovary: Normal appearance/no adnexal mass  Other Findings:  No free fluid  IMPRESSION:  1.  Stable small uterine fibroids.  Largest 1.8 cm fibroid is submucosal in location. Endometrial thickness measures 6 mm. 2.  Normal ovaries.  No evidence of adnexal mass  Original Report Authenticated By: Danae Orleans, M.D.     Disposition and Follow-up: Discharge Orders    Future Orders Please Complete By Expires   Diet general      Increase activity slowly          DISPOSITION: Home DIET: Regular  ACTIVITY: As tolerated  DISCHARGE FOLLOW-UP Follow-up Information    Follow up with Georganna Skeans, MD on 04/23/2011. (at 10:45 AM)    Contact information:   [address] [place] [address] [phone]       Follow up with Janine Limbo, MD. Schedule an appointment as soon as possible for a visit in 2 weeks.   Contact information:   [address] [place] [address] [phone]          Time spent on Discharge: 45 minutes  Signed:  RAI,RIPUDEEP M.D. [facility] 02/22/2011, 4:58 PM

## 2011-02-22 NOTE — Progress Notes (Signed)
PT HAS [facility] APPT FOR ELIG ON FEB 25TH AT 1145 AND AN APPT WITH DR Andrey Campanile ON April 5TH AT 1045.  INFORMATION WAS COVERED ON ELIG REQUIREMENTS.  PT INQUIRED ABOUT SCRIPTS AS ATTENDING STRESSED THAT ONE WAS OVER THE COUNTER AND THE OTHER WAS PRESCRIBED BY HER OBGYN.  PT WILL BE DC'D TO HOME WITH SELF CARE ON TODAY Willa Rough 02/22/2011 [phone] OR [phone]

## 2011-02-22 NOTE — Progress Notes (Signed)
Utilization review complete 

## 2011-07-14 ENCOUNTER — Encounter: Payer: Self-pay | Admitting: Obstetrics & Gynecology

## 2011-07-14 ENCOUNTER — Other Ambulatory Visit (HOSPITAL_COMMUNITY)
Admission: RE | Admit: 2011-07-14 | Discharge: 2011-07-14 | Disposition: A | Discharge status: Home / Self Care | Payer: Medicaid Other | Source: Ambulatory Visit | Attending: Obstetrics & Gynecology | Admitting: Obstetrics & Gynecology

## 2011-07-14 ENCOUNTER — Ambulatory Visit (INDEPENDENT_AMBULATORY_CARE_PROVIDER_SITE_OTHER): Payer: Medicaid Other | Admitting: Obstetrics & Gynecology

## 2011-07-14 VITALS — BP 132/85 | HR 80 | Temp 98.2°F | Ht 66.0 in | Wt 225.0 lb

## 2011-07-14 DIAGNOSIS — Z01419 Encounter for gynecological examination (general) (routine) without abnormal findings: Secondary | ICD-10-CM

## 2011-07-14 DIAGNOSIS — N938 Other specified abnormal uterine and vaginal bleeding: Secondary | ICD-10-CM

## 2011-07-14 DIAGNOSIS — Z1159 Encounter for screening for other viral diseases: Secondary | ICD-10-CM | POA: Insufficient documentation

## 2011-07-14 DIAGNOSIS — N949 Unspecified condition associated with female genital organs and menstrual cycle: Secondary | ICD-10-CM

## 2011-07-14 DIAGNOSIS — Z113 Encounter for screening for infections with a predominantly sexual mode of transmission: Secondary | ICD-10-CM | POA: Insufficient documentation

## 2011-07-14 DIAGNOSIS — Z0000 Encounter for general adult medical examination without abnormal findings: Secondary | ICD-10-CM

## 2011-07-14 LAB — CBC
Hemoglobin: 11.2 g/dL — ABNORMAL LOW (ref 12.0–15.0)
MCH: 23.5 pg — ABNORMAL LOW (ref 26.0–34.0)
MCHC: 33 g/dL (ref 30.0–36.0)
MCV: 71.2 fL — ABNORMAL LOW (ref 78.0–100.0)

## 2011-07-14 LAB — TSH: TSH: 2.115 u[IU]/mL (ref 0.350–4.500)

## 2011-07-14 MED ORDER — MISOPROSTOL 200 MCG PO TABS: ORAL_TABLET | ORAL | Status: DC

## 2011-07-14 NOTE — Progress Notes (Signed)
Subjective:    [name] is a 41 y.o. female who presents for an annual exam. Her complaint is that of menorrhagia and a 6 month h/o pelvic pain that starts 1 week prior to period and is worse during her 3 day period. She took daily provera for 3 months and her periods lengthened. No provera since 5/13. The patient is sexually active. GYN screening history: last pap: was normal. The patient wears seatbelts: no. The patient participates in regular exercise: not asked. Has the patient ever been transfused or tattooed?: yes. (transfused 3 unit 2/13 to get her Hbg from 6.1 to 10).The patient reports that there is not domestic violence in her life.   Menstrual History: OB History    Grav Para Term Preterm Abortions TAB SAB Ect Mult Living                  Menarche age: 19 Patient's last menstrual period was 06/16/2011.    The following portions of the patient's history were reviewed and updated as appropriate: allergies, current medications, past family history, past medical history, past social history, past surgical history and problem list.  Review of Systems A comprehensive review of systems was negative. She is a hair stylist.   Objective:    BP 132/85  Pulse 80  Temp 98.2 F (36.8 C) (Oral)  Ht 5\' 6"  (1.676 m)  Wt 225 lb (102.059 kg)  BMI 36.32 kg/m2  LMP 06/16/2011  General Appearance:    Alert, cooperative, no distress, appears stated age  Head:    Normocephalic, without obvious abnormality, atraumatic  Eyes:    PERRL, conjunctiva/corneas clear, EOM's intact, fundi    benign, both eyes  Ears:    Normal TM's and external ear canals, both ears  Nose:   Nares normal, septum midline, mucosa normal, no drainage    or sinus tenderness  Throat:   Lips, mucosa, and tongue normal; teeth and gums normal  Neck:   Supple, symmetrical, trachea midline, no adenopathy;    thyroid:  no enlargement/tenderness/nodules; no carotid   bruit or JVD  Back:     Symmetric, no curvature, ROM  normal, no CVA tenderness  Lungs:     Clear to auscultation bilaterally, respirations unlabored  Chest Wall:    No tenderness or deformity   Heart:    Regular rate and rhythm, S1 and S2 normal, no murmur, rub   or gallop  Breast Exam:    No tenderness, masses, or nipple abnormality  Abdomen:     Soft, non-tender, bowel sounds active all four quadrants,    no masses, no organomegaly, obese, scars from C/Ss  Genitalia:    Normal female without lesion, discharge or tenderness, no lesions, nulliparous cervix, 6 week size mobile, NT uterus, non-enlarged adnexa     Extremities:   Extremities normal, atraumatic, no cyanosis or edema  Pulses:   2+ and symmetric all extremities  Skin:   Skin color, texture, turgor normal, no rashes or lesions  Lymph nodes:   Cervical, supraclavicular, and axillary nodes normal  Neurologic:   CNII-XII intact, normal strength, sensation and reflexes    throughout  .    Assessment:    Healthy female exam.    Plan:     pap smear today RTC for EMBX Check CBC and TSH.

## 2011-07-26 ENCOUNTER — Telehealth: Payer: Self-pay | Admitting: *Deleted

## 2011-07-26 DIAGNOSIS — N882 Stricture and stenosis of cervix uteri: Secondary | ICD-10-CM

## 2011-07-26 MED ORDER — MISOPROSTOL 200 MCG PO TABS: ORAL_TABLET | ORAL | Status: DC

## 2011-07-26 NOTE — Telephone Encounter (Signed)
Patient states that she was supposed to get a prescription to take the night before her endometrial biopsy but didn't get it. She uses the [facility] on [address].

## 2011-07-26 NOTE — Telephone Encounter (Signed)
Called [facility] and notified prescription for cytotec was sent to her pharmacy on file- [facility] on 6/26. She states she called them and they didn't have it- informed her order was sent on 6/26 so by now it was probably reshelved.  Patient requests prescription be sent to [facility] because it should be less expensive. Informed her  I would. She plans to get prescription tomorrow and hold until she takes in before her biopsy in August. Called [facility] and canceled prescription and new prescription sent to [facility] per her request at [facility]

## 2011-07-28 ENCOUNTER — Telehealth: Payer: Self-pay | Admitting: *Deleted

## 2011-07-28 DIAGNOSIS — A599 Trichomoniasis, unspecified: Secondary | ICD-10-CM

## 2011-07-28 MED ORDER — METRONIDAZOLE 500 MG PO TABS: 500.0000 mg | ORAL_TABLET | Freq: Three times a day (TID) | ORAL | Status: AC

## 2011-07-28 NOTE — Telephone Encounter (Signed)
Patients pap +trich, patient informed, she will pick up her medication at the pharmacy.

## 2011-07-28 NOTE — Telephone Encounter (Signed)
Message copied by Mannie Stabile on Wed Jul 28, 2011  8:34 AM ------      Message from: Nicholaus Bloom C      Created: Wed Jul 21, 2011  1:28 PM       Please tell her she needs to take flagyl 500 mg TID for a week (please prescribe it) and her partner (s) need to be [facility]. We will need to check her in a month for test of cure (TOC).

## 2011-08-02 ENCOUNTER — Telehealth: Payer: Self-pay

## 2011-08-02 MED ORDER — FLUCONAZOLE 150 MG PO TABS: 150.0000 mg | ORAL_TABLET | Freq: Once | ORAL | Status: AC

## 2011-08-02 NOTE — Telephone Encounter (Signed)
Pt called and stated that she received call from the clinics could you please call me back. Called pt and pt informed me that someone from a # [phone] called.  I informed pt that the call doesn't seem to have come [place] and that looking @ her chart it looks like we had contacted her.  Pt stated ok and wanted to know if she could have something for yeast infection prescribed since every time she takes antibiotics she gets one.  I informed pt that I would prescribe her one time dose of diflucan per standing order.  Pt stated "thank you" and had no further questions.

## 2011-08-23 ENCOUNTER — Other Ambulatory Visit (HOSPITAL_COMMUNITY)
Admission: RE | Admit: 2011-08-23 | Discharge: 2011-08-23 | Disposition: A | Discharge status: Home / Self Care | Payer: Medicaid Other | Source: Ambulatory Visit | Attending: Obstetrics & Gynecology | Admitting: Obstetrics & Gynecology

## 2011-08-23 ENCOUNTER — Ambulatory Visit (INDEPENDENT_AMBULATORY_CARE_PROVIDER_SITE_OTHER): Payer: Medicaid Other | Admitting: Obstetrics & Gynecology

## 2011-08-23 ENCOUNTER — Encounter: Payer: Self-pay | Admitting: Obstetrics & Gynecology

## 2011-08-23 VITALS — BP 115/78 | HR 68 | Temp 98.0°F | Ht 66.0 in | Wt 224.7 lb

## 2011-08-23 DIAGNOSIS — N920 Excessive and frequent menstruation with regular cycle: Secondary | ICD-10-CM | POA: Insufficient documentation

## 2011-08-23 NOTE — Progress Notes (Signed)
  Subjective:    Patient ID: [name], female    DOB: 10/16/1970, 41 y.o.   MRN: [id number]  HPI  41 yo lady with menorrhagia and a small submucosal fibroid seen on u/s. She is here today for a EMBX. She tells me that her last 2 periods were actually much lighter. She took cytotec last night. Review of Systems     Objective:   Physical Exam UPT negative, consent signed, and time out done. Cervix prepped with betadine and grasped with single tooth tenaculum. Uterus sounded to 9 cm.  Pipelle used for 3 passes with a moderate amount of tissue obtained. She tolerated the procedure well.       Assessment & Plan:  Menorrhagia, mild anemia, submucosal fibroid. I will await the embx results. At this time she declines any treatment. RTC 1 year/prn sooner.

## 2011-08-23 NOTE — Addendum Note (Signed)
Addended by: Kathee Delton on: 08/23/2011 02:24 PM   Modules accepted: Orders

## 2012-02-16 ENCOUNTER — Encounter: Payer: Self-pay | Admitting: Obstetrics & Gynecology

## 2012-02-16 ENCOUNTER — Ambulatory Visit (INDEPENDENT_AMBULATORY_CARE_PROVIDER_SITE_OTHER): Payer: Medicaid Other | Admitting: Obstetrics & Gynecology

## 2012-02-16 VITALS — BP 111/69 | HR 71 | Temp 98.1°F | Resp 20 | Ht 66.0 in | Wt 227.0 lb

## 2012-02-16 DIAGNOSIS — R102 Pelvic and perineal pain: Secondary | ICD-10-CM

## 2012-02-16 DIAGNOSIS — D649 Anemia, unspecified: Secondary | ICD-10-CM

## 2012-02-16 DIAGNOSIS — N920 Excessive and frequent menstruation with regular cycle: Secondary | ICD-10-CM

## 2012-02-16 DIAGNOSIS — N949 Unspecified condition associated with female genital organs and menstrual cycle: Secondary | ICD-10-CM

## 2012-02-16 NOTE — Patient Instructions (Signed)
Endometrial Ablation Endometrial ablation removes the lining of the uterus (endometrium). It is usually a same day, outpatient treatment. Ablation helps avoid major surgery (such as a hysterectomy). A hysterectomy is removal of the cervix and uterus. Endometrial ablation has less risk and complications, has a shorter recovery period and is less expensive. After endometrial ablation, most women will have little or no menstrual bleeding. You may not keep your fertility. Pregnancy is no longer likely after this procedure but if you are pre-menopausal, you still need to use a reliable method of birth control following the procedure because pregnancy can occur. REASONS TO HAVE THE PROCEDURE MAY INCLUDE:  Heavy periods.  Bleeding that is causing anemia.  Anovulatory bleeding, very irregular, bleeding.  Bleeding submucous fibroids (on the lining inside the uterus) if they are smaller than 3 centimeters. REASONS NOT TO HAVE THE PROCEDURE MAY INCLUDE:  You wish to have more children.  You have a pre-cancerous or cancerous problem. The cause of any abnormal bleeding must be diagnosed before having the procedure.  You have pain coming from the uterus.  You have a submucus fibroid larger than 3 centimeters.  You recently had a baby.  You recently had an infection in the uterus.  You have a severe retro-flexed, tipped uterus and cannot insert the instrument to do the ablation.  You had a Cesarean section or deep major surgery on the uterus.  The inner cavity of the uterus is too large for the endometrial ablation instrument. RISKS AND COMPLICATIONS   Perforation of the uterus.  Bleeding.  Infection of the uterus, bladder or vagina.  Injury to surrounding organs.  Cutting the cervix.  An air bubble to the lung (air embolus).  Pregnancy following the procedure.  Failure of the procedure to help the problem requiring hysterectomy.  Decreased ability to diagnose cancer in the lining of  the uterus. BEFORE THE PROCEDURE  The lining of the uterus must be tested to make sure there is no pre-cancerous or cancer cells present.  Medications may be given to make the lining of the uterus thinner.  Ultrasound may be used to evaluate the size and look for abnormalities of the uterus.  Future pregnancy is not desired. PROCEDURE  There are different ways to destroy the lining of the uterus.   Resectoscope - radio frequency-alternating electric current is the most common one used.  Cryotherapy - freezing the lining of the uterus.  Heated Free Liquid - heated salt (saline) solution inserted into the uterus.  Microwave - uses high energy microwaves in the uterus.  Thermal Balloon - a catheter with a balloon tip is inserted into the uterus and filled with heated fluid. Your caregiver will talk with you about the method used in this clinic. They will also instruct you on the pros and cons of the procedure. Endometrial ablation is performed along with a procedure called operative hysteroscopy. A narrow viewing tube is inserted through the birth canal (vagina) and through the cervix into the uterus. A tiny camera attached to the viewing tube (hysteroscope) allows the uterine cavity to be shown on a TV monitor during surgery. Your uterus is filled with a harmless liquid to make the procedure easier. The lining of the uterus is then removed. The lining can also be removed with a resectoscope which allows your surgeon to cut away the lining of the uterus under direct vision. Usually, you will be able to go home within an hour after the procedure. HOME CARE INSTRUCTIONS   Do   not drive for 24 hours.  No tampons, douching or intercourse for 2 weeks or until your caregiver approves.  Rest at home for 24 to 48 hours. You may then resume normal activities unless told differently by your caregiver.  Take your temperature two times a day for 4 days, and record it.  Take any medications your  caregiver has ordered, as directed.  Use some form of contraception if you are pre-menopausal and do not want to get pregnant. Bleeding after the procedure is normal. It varies from light spotting and mildly watery to bloody discharge for 4 to 6 weeks. You may also have mild cramping. Only take over-the-counter or prescription medicines for pain, discomfort, or fever as directed by your caregiver. Do not use aspirin, as this may aggravate bleeding. Frequent urination during the first 24 hours is normal. You will not know how effective your surgery is until at least 3 months after the surgery. SEEK IMMEDIATE MEDICAL CARE IF:   Bleeding is heavier than a normal menstrual cycle.  An oral temperature above 102 F (38.9 C) develops.  You have increasing cramps or pains not relieved with medication or develop belly (abdominal) pain which does not seem to be related to the same area of earlier cramping and pain.  You are light headed, weak or have fainting episodes.  You develop pain in the shoulder strap areas.  You have chest or leg pain.  You have abnormal vaginal discharge.  You have painful urination. Document Released: 11/14/2003 Document Revised: 03/29/2011 Document Reviewed: 02/11/2007 [facility] Patient Information 2013 [place], [place]. Diagnostic Laparoscopy Laparoscopy is a surgical procedure. It is used to diagnose and treat diseases inside the belly(abdomen). It is usually a brief, common, and relatively simple procedure. The laparoscopeis a thin, lighted, pencil-sized instrument. It is like a telescope. It is inserted into your abdomen through a small cut (incision). Your caregiver can look at the organs inside your body through this instrument. He or she can see if there is anything abnormal. Laparoscopy can be done either in a hospital or outpatient clinic. You may be given a mild sedative to help you relax before the procedure. Once in the operating room, you will be given a drug to  make you sleep (general anesthesia). Laparoscopy usually lasts less than 1 hour. After the procedure, you will be monitored in a recovery area until you are stable and doing well. Once you are home, it will take 2 to 3 days to fully recover. RISKS AND COMPLICATIONS  Laparoscopy has relatively few risks. Your caregiver will discuss the risks with you before the procedure. Some problems that can occur include:  Infection.  Bleeding.  Damage to other organs.  Anesthetic side effects. PROCEDURE Once you receive anesthesia, your surgeon inflates the abdomen with a harmless gas (carbon dioxide). This makes the organs easier to see. The laparoscope is inserted into the abdomen through a small incision. This allows your surgeon to see into the abdomen. Other small instruments are also inserted into the abdomen through other small openings. Many surgeons attach a video camera to the laparoscope to enlarge the view. During a diagnostic laparoscopy, the surgeon may be looking for inflammation, infection, or cancer. Your surgeon may take tissue samples(biopsies). The samples are sent to a specialist in looking at cells and tissue samples (pathologist). The pathologist examines them under a microscope. Biopsies can help to diagnose or confirm a disease. AFTER THE PROCEDURE   The gas is released from inside the abdomen.  The  incisions are closed with stitches (sutures). Because these incisions are small (usually less than 1/2 inch), there is usually minimal discomfort after the procedure. There may be some mild discomfort in the throat. This is from the tube placed in the throat while you were sleeping. You may have some mild abdominal discomfort. There may also be discomfort from the instrument placement incisions in the abdomen.  The recovery time is shortened as long as there are no complications.  You will rest in a recovery room until stable and doing well. As long as there are no complications, you  may be allowed to go home. FINDING OUT THE RESULTS OF YOUR TEST Not all test results are available during your visit. If your test results are not back during the visit, make an appointment with your caregiver to find out the results. Do not assume everything is normal if you have not heard from your caregiver or the medical facility. It is important for you to follow up on all of your test results. HOME CARE INSTRUCTIONS   Take all medicines as directed.  Only take over-the-counter or prescription medicines for pain, discomfort, or fever as directed by your caregiver.  Resume daily activities as directed.  Showers are preferred over baths.  You may resume sexual activities in 1 week or as directed.  Do not drive while taking narcotics. SEEK MEDICAL CARE IF:   There is increasing abdominal pain.  There is new pain in the shoulders (shoulder strap areas).  You feel lightheaded or faint.  You have the chills.  You or your child has an oral temperature above 102 F (38.9 C).  There is pus-like (purulent) drainage from any of the wounds.  You are unable to pass gas or have a bowel movement.  You feel sick to your stomach (nauseous) or throw up (vomit). MAKE SURE YOU:   Understand these instructions.  Will watch your condition.  Will get help right away if you are not doing well or get worse. Document Released: 04/12/2000 Document Revised: 03/29/2011 Document Reviewed: 01/04/2007 [facility] Patient Information 2013 [place], [place].

## 2012-02-16 NOTE — Progress Notes (Signed)
  Subjective:    Patient ID: [name], female    DOB: [birth year], 42 y.o.   MRN: [id number]  HPI  42 yo lady with several small fibroids ( 1.8 cm), with one having a submucosal component. She complains of pelvic pain, more left than right. It is worse with her periods which come q 21 days, lasting 3-5 days.  Review of Systems Pap smear UTD, mammogram due at 42 She is not in a relationship at this time and has been abstinent for about 4 months, but had occasional pain with sex prior. She is a hair stylist, self employeed    Objective:   Physical Exam  Somewhat tender uterus, mobile, ULN size, Normal adnexa       Assessment & Plan:  Menorrhagia- with anemia- I have offered Mirena versus endometrial ablation CPP- I have offered diagnostic laparoscopy She would to have these scheduled.

## 2012-03-20 ENCOUNTER — Encounter (HOSPITAL_COMMUNITY): Payer: Self-pay | Admitting: Pharmacist

## 2012-03-22 ENCOUNTER — Ambulatory Visit (INDEPENDENT_AMBULATORY_CARE_PROVIDER_SITE_OTHER): Payer: Medicaid Other | Admitting: Obstetrics & Gynecology

## 2012-03-22 ENCOUNTER — Encounter: Payer: Self-pay | Admitting: Obstetrics & Gynecology

## 2012-03-22 VITALS — BP 118/74 | HR 77 | Temp 97.0°F | Ht 66.5 in | Wt 231.0 lb

## 2012-03-22 DIAGNOSIS — R102 Pelvic and perineal pain: Secondary | ICD-10-CM

## 2012-03-22 DIAGNOSIS — N920 Excessive and frequent menstruation with regular cycle: Secondary | ICD-10-CM

## 2012-03-22 NOTE — Progress Notes (Signed)
  Subjective:    Patient ID: [name], female    DOB: 29-Sep-1970, 42 y.o.   MRN: [id number]  HPI  [name] has chronic pelvic pain and menorrhagia. She is here for a pre op visit. She is scheduled to have a diagnostic laparoscopy and Novasure endometrial ablation next week. All questions were answered. I quoted her a 90% satisfaction rate with the ablation, 40% amenorrhea rate.  Review of Systems     Objective:   Physical Exam        Assessment & Plan:  Chronic pelvic pain, menorrhagia- for diagnostic laparoscopy and Novasure ablation.

## 2012-03-27 ENCOUNTER — Inpatient Hospital Stay (HOSPITAL_COMMUNITY): Admission: RE | Admit: 2012-03-27 | Payer: Medicaid Other | Source: Ambulatory Visit

## 2012-03-28 ENCOUNTER — Encounter (HOSPITAL_COMMUNITY): Payer: Self-pay

## 2012-03-28 ENCOUNTER — Encounter (HOSPITAL_COMMUNITY)
Admission: RE | Admit: 2012-03-28 | Discharge: 2012-03-28 | Disposition: A | Discharge status: Home / Self Care | Payer: Medicaid Other | Source: Ambulatory Visit | Attending: Obstetrics & Gynecology | Admitting: Obstetrics & Gynecology

## 2012-03-28 LAB — CBC
HCT: 33.5 % — ABNORMAL LOW (ref 36.0–46.0)
Hemoglobin: 10.8 g/dL — ABNORMAL LOW (ref 12.0–15.0)
MCV: 74.6 fL — ABNORMAL LOW (ref 78.0–100.0)
RBC: 4.49 MIL/uL (ref 3.87–5.11)
RDW: 14.6 % (ref 11.5–15.5)
WBC: 6.8 10*3/uL (ref 4.0–10.5)

## 2012-03-28 NOTE — Patient Instructions (Signed)
Your procedure is scheduled on:03/31/12  Enter through the Main Entrance at :1pm Pick up desk phone and dial [phone] and inform us of your arrival.  Please call [phone] if you have any problems the morning of surgery.  Remember: Do not eat after midnight:Thursday Clear liquids ok until 1030 am Friday  Take these meds the morning of surgery with a sip of water:none  DO NOT wear jewelry, eye make-up, lipstick,body lotion, or dark fingernail polish. Do not shave for 48 hours prior to surgery.  If you are to be admitted after surgery, leave suitcase in car until your room has been assigned. Patients discharged on the day of surgery will not be allowed to drive home.

## 2012-03-31 ENCOUNTER — Encounter (HOSPITAL_COMMUNITY): Payer: Self-pay | Admitting: Anesthesiology

## 2012-03-31 ENCOUNTER — Ambulatory Visit (HOSPITAL_COMMUNITY)
Admission: RE | Admit: 2012-03-31 | Discharge: 2012-03-31 | Disposition: A | Discharge status: Home / Self Care | Payer: Medicaid Other | Source: Ambulatory Visit | Attending: Obstetrics & Gynecology | Admitting: Obstetrics & Gynecology

## 2012-03-31 ENCOUNTER — Ambulatory Visit (HOSPITAL_COMMUNITY): Payer: Medicaid Other

## 2012-03-31 ENCOUNTER — Ambulatory Visit: Admit: 2012-03-31 | Payer: Self-pay | Admitting: Obstetrics & Gynecology

## 2012-03-31 ENCOUNTER — Encounter (HOSPITAL_COMMUNITY): Payer: Self-pay

## 2012-03-31 ENCOUNTER — Encounter (HOSPITAL_COMMUNITY)
Admission: RE | Disposition: A | Discharge status: Home / Self Care | Payer: Self-pay | Source: Ambulatory Visit | Attending: Obstetrics & Gynecology

## 2012-03-31 DIAGNOSIS — N949 Unspecified condition associated with female genital organs and menstrual cycle: Secondary | ICD-10-CM | POA: Insufficient documentation

## 2012-03-31 DIAGNOSIS — D649 Anemia, unspecified: Secondary | ICD-10-CM | POA: Insufficient documentation

## 2012-03-31 DIAGNOSIS — Z01818 Encounter for other preprocedural examination: Secondary | ICD-10-CM | POA: Insufficient documentation

## 2012-03-31 DIAGNOSIS — N920 Excessive and frequent menstruation with regular cycle: Secondary | ICD-10-CM | POA: Insufficient documentation

## 2012-03-31 DIAGNOSIS — Z01812 Encounter for preprocedural laboratory examination: Secondary | ICD-10-CM | POA: Insufficient documentation

## 2012-03-31 HISTORY — PX: DILATION AND CURETTAGE OF UTERUS: SHX78

## 2012-03-31 HISTORY — PX: LAPAROSCOPIC LYSIS OF ADHESIONS: SHX5905

## 2012-03-31 HISTORY — PX: NOVASURE ABLATION: SHX5394

## 2012-03-31 HISTORY — PX: LAPAROSCOPY: SHX197

## 2012-03-31 SURGERY — NOVASURE ABLATION
Anesthesia: General | Site: Vagina | Wound class: Clean Contaminated

## 2012-03-31 SURGERY — NOVASURE ABLATION
Anesthesia: Choice | Site: Vagina

## 2012-03-31 MED ORDER — FENTANYL CITRATE 0.05 MG/ML IJ SOLN
INTRAMUSCULAR | Status: DC | PRN
  Administered 2012-03-31: 100 ug via INTRAVENOUS
  Administered 2012-03-31 (×2): 50 ug via INTRAVENOUS
  Administered 2012-03-31: 100 ug via INTRAVENOUS

## 2012-03-31 MED ORDER — BUPIVACAINE HCL (PF) 0.5 % IJ SOLN
INTRAMUSCULAR | Status: DC | PRN
  Administered 2012-03-31: 13 mL

## 2012-03-31 MED ORDER — FENTANYL CITRATE 0.05 MG/ML IJ SOLN
INTRAMUSCULAR | Status: AC
  Filled 2012-03-31: qty 4

## 2012-03-31 MED ORDER — DEXAMETHASONE SODIUM PHOSPHATE 4 MG/ML IJ SOLN
INTRAMUSCULAR | Status: DC | PRN
  Administered 2012-03-31: 10 mg via INTRAVENOUS

## 2012-03-31 MED ORDER — FENTANYL CITRATE 0.05 MG/ML IJ SOLN
INTRAMUSCULAR | Status: AC
  Administered 2012-03-31: 50 ug via INTRAVENOUS
  Filled 2012-03-31: qty 2

## 2012-03-31 MED ORDER — MIDAZOLAM HCL 5 MG/5ML IJ SOLN
INTRAMUSCULAR | Status: DC | PRN
  Administered 2012-03-31: 2 mg via INTRAVENOUS

## 2012-03-31 MED ORDER — KETOROLAC TROMETHAMINE 30 MG/ML IJ SOLN
INTRAMUSCULAR | Status: AC
  Filled 2012-03-31: qty 1

## 2012-03-31 MED ORDER — FENTANYL CITRATE 0.05 MG/ML IJ SOLN
INTRAMUSCULAR | Status: AC
  Filled 2012-03-31: qty 2

## 2012-03-31 MED ORDER — LIDOCAINE HCL (CARDIAC) 20 MG/ML IV SOLN
INTRAVENOUS | Status: DC | PRN
  Administered 2012-03-31: 60 mg via INTRAVENOUS

## 2012-03-31 MED ORDER — MIDAZOLAM HCL 2 MG/2ML IJ SOLN
INTRAMUSCULAR | Status: AC
  Filled 2012-03-31: qty 2

## 2012-03-31 MED ORDER — LACTATED RINGERS IV SOLN
INTRAVENOUS | Status: DC | PRN
  Administered 2012-03-31 (×3): via INTRAVENOUS

## 2012-03-31 MED ORDER — BUPIVACAINE HCL (PF) 0.5 % IJ SOLN
INTRAMUSCULAR | Status: AC
  Filled 2012-03-31: qty 30

## 2012-03-31 MED ORDER — PROPOFOL 10 MG/ML IV EMUL
INTRAVENOUS | Status: AC
  Filled 2012-03-31: qty 20

## 2012-03-31 MED ORDER — ONDANSETRON HCL 4 MG/2ML IJ SOLN
INTRAMUSCULAR | Status: AC
  Filled 2012-03-31: qty 2

## 2012-03-31 MED ORDER — DEXAMETHASONE SODIUM PHOSPHATE 10 MG/ML IJ SOLN
INTRAMUSCULAR | Status: AC
  Filled 2012-03-31: qty 1

## 2012-03-31 MED ORDER — ROCURONIUM BROMIDE 100 MG/10ML IV SOLN
INTRAVENOUS | Status: DC | PRN
  Administered 2012-03-31: 10 mg via INTRAVENOUS
  Administered 2012-03-31: 35 mg via INTRAVENOUS
  Administered 2012-03-31: 5 mg via INTRAVENOUS

## 2012-03-31 MED ORDER — HYDROCODONE-ACETAMINOPHEN 5-325 MG PO TABS: 1.0000 | ORAL_TABLET | Freq: Once | ORAL | Status: DC

## 2012-03-31 MED ORDER — PROPOFOL 10 MG/ML IV EMUL
INTRAVENOUS | Status: DC | PRN
  Administered 2012-03-31: 200 mg via INTRAVENOUS

## 2012-03-31 MED ORDER — KETOROLAC TROMETHAMINE 30 MG/ML IJ SOLN
INTRAMUSCULAR | Status: DC | PRN
  Administered 2012-03-31: 30 mg via INTRAVENOUS

## 2012-03-31 MED ORDER — ONDANSETRON HCL 4 MG/2ML IJ SOLN
INTRAMUSCULAR | Status: DC | PRN
  Administered 2012-03-31: 4 mg via INTRAVENOUS

## 2012-03-31 MED ORDER — GLYCOPYRROLATE 0.2 MG/ML IJ SOLN
INTRAMUSCULAR | Status: DC | PRN
  Administered 2012-03-31: .6 mg via INTRAVENOUS

## 2012-03-31 MED ORDER — CEFAZOLIN SODIUM-DEXTROSE 2-3 GM-% IV SOLR
2.0000 g | Freq: Once | INTRAVENOUS | Status: AC
  Administered 2012-03-31: 2 g via INTRAVENOUS

## 2012-03-31 MED ORDER — HYDROCODONE-ACETAMINOPHEN 5-500 MG PO TABS: 1.0000 | ORAL_TABLET | Freq: Four times a day (QID) | ORAL | Status: DC | PRN

## 2012-03-31 MED ORDER — FENTANYL CITRATE 0.05 MG/ML IJ SOLN
25.0000 ug | INTRAMUSCULAR | Status: DC | PRN
  Administered 2012-03-31: 25 ug via INTRAVENOUS

## 2012-03-31 MED ORDER — NEOSTIGMINE METHYLSULFATE 1 MG/ML IJ SOLN
INTRAMUSCULAR | Status: DC | PRN
  Administered 2012-03-31: 3 mg via INTRAVENOUS

## 2012-03-31 MED ORDER — ACETAMINOPHEN 500 MG PO TABS
1000.0000 mg | ORAL_TABLET | Freq: Four times a day (QID) | ORAL | Status: DC | PRN
  Administered 2012-03-31: 1000 mg via ORAL

## 2012-03-31 MED ORDER — LIDOCAINE HCL (CARDIAC) 20 MG/ML IV SOLN
INTRAVENOUS | Status: AC
  Filled 2012-03-31: qty 5

## 2012-03-31 MED ORDER — ACETAMINOPHEN 500 MG PO TABS
ORAL_TABLET | ORAL | Status: AC
  Filled 2012-03-31: qty 2

## 2012-03-31 MED ORDER — HYDROCODONE-ACETAMINOPHEN 5-325 MG PO TABS
ORAL_TABLET | ORAL | Status: AC
  Filled 2012-03-31: qty 1

## 2012-03-31 MED ORDER — IBUPROFEN 800 MG PO TABS: 800.0000 mg | ORAL_TABLET | Freq: Three times a day (TID) | ORAL | Status: DC | PRN

## 2012-03-31 MED ORDER — NEOSTIGMINE METHYLSULFATE 1 MG/ML IJ SOLN
INTRAMUSCULAR | Status: AC
  Filled 2012-03-31: qty 1

## 2012-03-31 MED ORDER — GLYCOPYRROLATE 0.2 MG/ML IJ SOLN
INTRAMUSCULAR | Status: AC
  Filled 2012-03-31: qty 3

## 2012-03-31 SURGICAL SUPPLY — 38 items
ABLATOR ENDOMETRIAL BIPOLAR (ABLATOR) ×5 IMPLANT
APPLICATOR COTTON TIP 6IN STRL (MISCELLANEOUS) ×7 IMPLANT
BAG SPEC RTRVL LRG 6X4 10 (ENDOMECHANICALS)
CABLE HIGH FREQUENCY MONO STRZ (ELECTRODE) ×1 IMPLANT
CATH ROBINSON RED A/P 16FR (CATHETERS) ×5 IMPLANT
CHLORAPREP W/TINT 26ML (MISCELLANEOUS) ×4 IMPLANT
CLOTH BEACON ORANGE TIMEOUT ST (SAFETY) ×5 IMPLANT
ELECT REM PT RETURN 9FT ADLT (ELECTROSURGICAL) ×5
ELECTRODE REM PT RTRN 9FT ADLT (ELECTROSURGICAL) IMPLANT
FILTER SMOKE EVAC LAPAROSHD (FILTER) ×1 IMPLANT
FORCEPS CUTTING 33CM 5MM (CUTTING FORCEPS) IMPLANT
FORCEPS CUTTING 45CM 5MM (CUTTING FORCEPS) IMPLANT
GLOVE BIO SURGEON STRL SZ 6.5 (GLOVE) ×7 IMPLANT
GOWN PREVENTION PLUS LG XLONG (DISPOSABLE) ×11 IMPLANT
NDL SAFETY ECLIPSE 18X1.5 (NEEDLE) ×4 IMPLANT
NDL SPNL 22GX3.5 QUINCKE BK (NEEDLE) ×4 IMPLANT
NEEDLE HYPO 18GX1.5 SHARP (NEEDLE) ×5
NEEDLE INSUFFLATION 120MM (ENDOMECHANICALS) ×4 IMPLANT
NEEDLE SPNL 22GX3.5 QUINCKE BK (NEEDLE) ×5 IMPLANT
NS IRRIG 1000ML POUR BTL (IV SOLUTION) ×4 IMPLANT
PACK LAPAROSCOPY BASIN (CUSTOM PROCEDURE TRAY) ×5 IMPLANT
PACK VAGINAL MINOR WOMEN LF (CUSTOM PROCEDURE TRAY) ×4 IMPLANT
POUCH SPECIMEN RETRIEVAL 10MM (ENDOMECHANICALS) IMPLANT
PROTECTOR NERVE ULNAR (MISCELLANEOUS) ×6 IMPLANT
SCALPEL HARMONIC ACE (MISCELLANEOUS) IMPLANT
SCISSORS LAP 5X35 DISP (ENDOMECHANICALS) ×1 IMPLANT
SET IRRIG TUBING LAPAROSCOPIC (IRRIGATION / IRRIGATOR) IMPLANT
STRIP CLOSURE SKIN 1/2X4 (GAUZE/BANDAGES/DRESSINGS) ×4 IMPLANT
SUT VICRYL 0 ENDOLOOP (SUTURE) IMPLANT
SUT VICRYL 0 UR6 27IN ABS (SUTURE) ×5 IMPLANT
SUT VICRYL 4-0 PS2 18IN ABS (SUTURE) ×10 IMPLANT
SYR CONTROL 10ML LL (SYRINGE) ×5 IMPLANT
TOWEL OR 17X24 6PK STRL BLUE (TOWEL DISPOSABLE) ×10 IMPLANT
TROCAR OPTI TIP 5M 100M (ENDOMECHANICALS) ×5 IMPLANT
TROCAR XCEL DIL TIP R 11M (ENDOMECHANICALS) ×5 IMPLANT
TROCAR XCEL NON-BLD 5MMX100MML (ENDOMECHANICALS) ×1 IMPLANT
WARMER LAPAROSCOPE (MISCELLANEOUS) ×5 IMPLANT
WATER STERILE IRR 1000ML POUR (IV SOLUTION) ×4 IMPLANT

## 2012-03-31 NOTE — Anesthesia Preprocedure Evaluation (Signed)
Anesthesia Evaluation  Patient identified by MRN, date of birth, ID band Patient awake    Reviewed: Allergy & Precautions, H&P , Patient's Chart, lab work & pertinent test results, reviewed documented beta blocker date and time   Airway Mallampati: II TM Distance: >3 FB Neck ROM: full    Dental no notable dental hx.    Pulmonary  breath sounds clear to auscultation  Pulmonary exam normal       Cardiovascular Rhythm:regular Rate:Normal     Neuro/Psych    GI/Hepatic   Endo/Other    Renal/GU      Musculoskeletal   Abdominal   Peds  Hematology   Anesthesia Other Findings   Reproductive/Obstetrics                           Anesthesia Physical Anesthesia Plan  ASA: III  Anesthesia Plan: General   Post-op Pain Management:    Induction: Intravenous  Airway Management Planned: Oral ETT  Additional Equipment:   Intra-op Plan:   Post-operative Plan:   Informed Consent: I have reviewed the patients History and Physical, chart, labs and discussed the procedure including the risks, benefits and alternatives for the proposed anesthesia with the patient or authorized representative who has indicated his/her understanding and acceptance.   Dental Advisory Given and Dental advisory given  Plan Discussed with: CRNA and Surgeon  Anesthesia Plan Comments: (  Discussed  general anesthesia, including possible nausea, instrumentation of airway, sore throat,pulmonary aspiration, etc. I asked if the were any outstanding questions, or  concerns before we proceeded. )        Anesthesia Quick Evaluation

## 2012-03-31 NOTE — H&P (Signed)
[name] is an 42 y.o.s AA P2 (19 and 11)    Her complaint is that of menorrhagia and a 6 month h/o pelvic pain that starts 1 week prior to period and is worse during her 3 day period. She took daily provera for 3 months and her periods lengthened. No provera since 5/13. The patient is sexually active. GYN screening history: last pap: was normal. The patient wears seatbelts: no. The patient participates in regular exercise: not asked. Has the patient ever been transfused or tattooed?: yes. (transfused 3 unit 2/13 to get her Hbg from 6.1 to 10).The patient reports that there is not domestic violence in her life.    Pertinent Gynecological History: Menses: flow is excessive with use of 6 overnight  pads or tampons on heaviest days Bleeding: menorrhagia Contraception: none BTL DES exposure: denies Blood transfusions: transfused 3 units in 2013. Sexually transmitted diseases: no past history Previous GYN Procedures: DNC after a miscarriage Last mammogram: normal Date: 2012 Last pap: normal Date: 2013 OB History: G3P2A1   Menstrual History: Menarche age: 34 No LMP recorded.    Past Medical History  Diagnosis Date  . Fibroids, submucosal   . Anemia     Past Surgical History  Procedure Laterality Date  . Appendectomy    . Cesarean section  1994, 2002  . Tubal ligation  2002  . Wisdom tooth extraction      Family History  Problem Relation Age of Onset  . Fibroids Sister     Social History:  reports that she quit smoking about 14 months ago. She does not have any smokeless tobacco history on file. She reports that  drinks alcohol. She reports that she does not use illicit drugs.  Allergies: No Known Allergies  Prescriptions prior to admission  Medication Sig Dispense Refill  . ferrous gluconate (FERGON) 324 MG tablet Take 324 mg by mouth daily with breakfast.        Review of Systems  Musculoskeletal: Negative for falls.    Blood pressure 124/65, pulse 57,  temperature 98.4 F (36.9 C), resp. rate 18, SpO2 100.00%. Physical Exam  Results for orders placed during the hospital encounter of 03/31/12 (from the past 24 hour(s))  PREGNANCY, URINE     Status: None   Collection Time    03/31/12  1:06 PM      Result Value Range   Preg Test, Ur NEGATIVE  NEGATIVE    No results found.  Assessment/Plan: CPP- I will do a diagnostic laparoscopy to try to find a cause of her pelvic pain. Menorrhagia- I will do a Novasure endometrial ablation. I have quoted her a 90% satisfaction rate and a 40% amenorrhea rate.  She understands the risks of surgery, including, but not to infection, bleeding, DVTs, damage to bowel, bladder, ureters. She wishes to proceed.     DOVE,MYRA C. 03/31/2012, 1:50 PM

## 2012-03-31 NOTE — Transfer of Care (Signed)
Immediate Anesthesia Transfer of Care Note  Patient: [name]  Procedure(s) Performed: Procedure(s): NOVASURE ABLATION (N/A) LAPAROSCOPY OPERATIVE (N/A) LAPAROSCOPIC LYSIS OF ADHESIONS (N/A) DILATATION AND CURETTAGE (N/A)  Patient Location: PACU  Anesthesia Type:General  Level of Consciousness: awake, alert , oriented and patient cooperative  Airway & Oxygen Therapy: Patient Spontanous Breathing and Patient connected to nasal cannula oxygen  Post-op Assessment: Report given to PACU RN and Post -op Vital signs reviewed and stable  Post vital signs: Reviewed and stable  Complications: No apparent anesthesia complications

## 2012-03-31 NOTE — Op Note (Signed)
03/31/2012  3:06 PM  PATIENT:  [name]  42 y.o. female  PRE-OPERATIVE DIAGNOSIS:  cpt# [phone] -chronic pelvic pain, menorrhagia, anemia  POST-OPERATIVE DIAGNOSIS:  Same + adhesions  PROCEDURE:  Procedure(s): NOVASURE ABLATION (N/A) LAPAROSCOPY OPERATIVE (N/A) LAPAROSCOPIC LYSIS OF ADHESIONS (N/A) DILATION AND CURETTAGE  SURGEON:  Surgeon(s) and Role:    * Allie Bossier, MD - Primary  PHYSICIAN ASSISTANT:   ASSISTANTS: none   ANESTHESIA:   none  EBL:  Total I/O In: -  Out: 50 [Urine:50]  BLOOD ADMINISTERED:none  DRAINS: none   LOCAL MEDICATIONS USED:  MARCAINE     SPECIMEN:  Source of Specimen:  uterine curettings  DISPOSITION OF SPECIMEN:  PATHOLOGY  COUNTS:  YES  TOURNIQUET:  * No tourniquets in log *  DICTATION: .Dragon Dictation  PLAN OF CARE: Discharge to home after PACU  PATIENT DISPOSITION:  PACU - hemodynamically stable.       The risks, benefits, and alternatives of surgery were explained, understood, and accepted. I quoted her a 90% satisfaction rate for the NovaSure endometrial ablation. All questions were answered. She was taken to the operating room and placed in the dorsal lithotomy position. General anesthesia was applied without complication. Her vagina and abdomen prepped and draped in the usual sterile fashion. Her bladder was emptied with a Robinson catheter for approximately 75 mL. A bimanual exam revealed a normal size and shaped mobile uterus which is anteverted. Her adnexa were non-enlarged. A  speculum was placed  and the anterior lip of her cervix was grasped with a single-tooth tenaculum. A paracervical block was performed using 10 mL of 0.5% Marcaine. Her uterus sounded to 9 cm. Her cervical length measured 4.5 cm. This gives her uterine cavity length of 4.5 cm. The cervix was gently dilated with Hegar dilators to accommodate the NovaSure device. Sharp curettage was done in all quadrants and the fundus of the uterus. The arms of the  device was deployed and the uterine cavity width measured 3.8 cm. The device passed its test. An it ran for 90 seconds. I removed the tenaculum and no bleeding was noted. The NovaSure device was removed and no bleeding was noted from the endocervix. She was extubated and taken to the recovery room in stable condition. She tolerated the procedure well. I then changed my gloves and turned my attention to the abdomen. I injected about 10 cc of 0.5% marcaine into the umbilicus. I made a vertical incision here and placed a Veress needle. Low flow CO2 was used to insuflate the abdomen to about 3 liters. Her abdominal pressure was always less than 15.  I placed a 10 mm trocar. Laparoscopy confirmed correct placement. There were dense omental adhesions from the right of the umbilicus up to the liver. The pelvis showed some dense and some filmy adhesions as well as a left fundal fibroid. I placed a 5 mm port in the left lower quadrant under direct laparoscopic visualization. She was placed in the Trendelenburg position. I used electrocautery and scissors to carefully take down all of the adhesions attached to the uterus. Once it was mobile, I was able to inspect the cul de sac. There was no evidence of endometriosis. Both ovaries appeared normal. I could not adequately visualize the omental adhesions to safely take them down; therefore, I left them intact. I removed the 5 mm port and hemostasis was noted. I allowed the CO2 to escape from the abdomen. I then removed the port and closed the fascia with  a 0 vicryl figure of eight suture. No defects were palpable. I closed both skin incisions with 4-0 vicryl. She was extubated and tolerated the procedure well.

## 2012-04-02 NOTE — Anesthesia Postprocedure Evaluation (Signed)
  Anesthesia Post-op Note  Patient: [name]  Procedure(s) Performed: Procedure(s): NOVASURE ABLATION (N/A) LAPAROSCOPY OPERATIVE (N/A) LAPAROSCOPIC LYSIS OF ADHESIONS (N/A) DILATATION AND CURETTAGE (N/A)  Patient Location: PACU  Anesthesia Type:General  Level of Consciousness: awake, alert  and oriented  Airway and Oxygen Therapy: Patient Spontanous Breathing  Post-op Pain: mild  Post-op Assessment: Post-op Vital signs reviewed, Patient's Cardiovascular Status Stable, Respiratory Function Stable, Patent Airway, No signs of Nausea or vomiting and Pain level controlled  Post-op Vital Signs: Reviewed and stable  Complications: No apparent anesthesia complications

## 2012-04-03 ENCOUNTER — Encounter (HOSPITAL_COMMUNITY): Payer: Self-pay | Admitting: Obstetrics & Gynecology

## 2012-04-05 ENCOUNTER — Telehealth: Payer: Self-pay | Admitting: *Deleted

## 2012-04-05 NOTE — Telephone Encounter (Signed)
Pt called and stated that she had surgery last week and has a question.

## 2012-04-06 NOTE — Telephone Encounter (Signed)
Called patient and told her I received her message. Patient stated she had an ablation last week and it was initially spotting but now its like a period and she wants to know if this is normal or not. Told patient that can be normal following the procedure since her body is "clearing everything out" and that it will take 3 months or so before she can get an idea of what her periods will be like or if she has any at all. Patient verbalized understanding and had no further questions

## 2012-04-20 ENCOUNTER — Telehealth: Payer: Self-pay | Admitting: *Deleted

## 2012-04-20 NOTE — Telephone Encounter (Signed)
[name] called and left a message stating she had surgery about 2.5 weeks  Ago and is lightly spotting which she read is normal on discharge directions, but states she was feeling ok, but then about Monday started to be sore again- around navel and there is a sore in there.  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 We discussed she is sore in navel and abdomen- that maybe she is doing a little too much and to go back to lifting no more than 5 lbs for now. Also since navel area is more sore now than it was and has a sore that it  should be evaluated- she denies redness or drainage , but states is more tender now and a little swollen. Gave her an appointment for tomorrow- patient states not sure if she can come in until she calls tomorrow to her work- but will call and change appt if needed to Monday or come to [facility] over the weekend if soreness worsens or she sees redness, or drainage

## 2012-04-21 ENCOUNTER — Ambulatory Visit (INDEPENDENT_AMBULATORY_CARE_PROVIDER_SITE_OTHER): Payer: Medicaid Other | Admitting: Obstetrics & Gynecology

## 2012-04-21 ENCOUNTER — Encounter: Payer: Self-pay | Admitting: Obstetrics & Gynecology

## 2012-04-21 VITALS — BP 113/74 | HR 61 | Temp 98.9°F | Ht 66.0 in | Wt 229.7 lb

## 2012-04-21 DIAGNOSIS — Z09 Encounter for follow-up examination after completed treatment for conditions other than malignant neoplasm: Secondary | ICD-10-CM

## 2012-04-21 MED ORDER — SULFAMETHOXAZOLE-TRIMETHOPRIM 800-160 MG PO TABS: 1.0000 | ORAL_TABLET | Freq: Two times a day (BID) | ORAL | Status: DC

## 2012-04-21 MED ORDER — OXYCODONE-ACETAMINOPHEN 5-325 MG PO TABS: 1.0000 | ORAL_TABLET | ORAL | Status: DC | PRN

## 2012-04-21 NOTE — Progress Notes (Signed)
GYNECOLOGY CLINIC PROGRESS NOTE  History:  42 y.o. [id number] here today for umbilical incision tenderness s/p laparoscopic LOA on 03/31/12.  Reported that her incision was fine after surgery, but over the last few days has been exquisitely tender.  She is concerned about an infection. No concerns about the other incisions. Denies any fevers or other systemic symptoms.  The following portions of the patient's history were reviewed and updated as appropriate: allergies, current medications, past family history, past medical history, past social history, past surgical history and problem list.  Review of Systems:  As mentioned in HPI  Objective:  Physical Exam BP 113/74  Pulse 61  Temp(Src) 98.9 F (37.2 C) (Oral)  Ht 5\' 6"  (1.676 m)  Wt 229 lb 11.2 oz (104.191 kg)  BMI 37.09 kg/m2  LMP 03/12/2012 Gen: NAD Abd: Soft, nondistended.  Umbilical incision is very tender to touch, blanching erythema noted around incision. No fluctuance, induration or drainage.  Other incisions have healed well. Pelvic: Deferred   Assessment & Plan:  Incisional cellulitis.  Bactrim DS prescribed, also encouraged to take NSAIDs as needed. She was advised to return for her postoperative exam with Dr. Marice Potter or earlier if needed.

## 2012-04-21 NOTE — Patient Instructions (Signed)
Return to clinic for any scheduled appointments or for any gynecologic concerns as needed.   

## 2012-04-22 ENCOUNTER — Other Ambulatory Visit: Payer: Self-pay | Admitting: Advanced Practice Midwife

## 2012-04-22 ENCOUNTER — Emergency Department (HOSPITAL_COMMUNITY)
Admission: EM | Admit: 2012-04-22 | Discharge: 2012-04-22 | Disposition: A | Discharge status: Home / Self Care | Payer: Medicaid Other | Attending: Emergency Medicine | Admitting: Emergency Medicine

## 2012-04-22 ENCOUNTER — Telehealth: Payer: Self-pay | Admitting: Advanced Practice Midwife

## 2012-04-22 ENCOUNTER — Encounter (HOSPITAL_COMMUNITY): Payer: Self-pay | Admitting: Emergency Medicine

## 2012-04-22 DIAGNOSIS — T7840XA Allergy, unspecified, initial encounter: Secondary | ICD-10-CM

## 2012-04-22 DIAGNOSIS — D649 Anemia, unspecified: Secondary | ICD-10-CM | POA: Insufficient documentation

## 2012-04-22 DIAGNOSIS — L509 Urticaria, unspecified: Secondary | ICD-10-CM | POA: Insufficient documentation

## 2012-04-22 DIAGNOSIS — Z87891 Personal history of nicotine dependence: Secondary | ICD-10-CM | POA: Insufficient documentation

## 2012-04-22 DIAGNOSIS — Z8742 Personal history of other diseases of the female genital tract: Secondary | ICD-10-CM | POA: Insufficient documentation

## 2012-04-22 DIAGNOSIS — Z9889 Other specified postprocedural states: Secondary | ICD-10-CM | POA: Insufficient documentation

## 2012-04-22 DIAGNOSIS — R6889 Other general symptoms and signs: Secondary | ICD-10-CM | POA: Insufficient documentation

## 2012-04-22 DIAGNOSIS — T368X5A Adverse effect of other systemic antibiotics, initial encounter: Secondary | ICD-10-CM | POA: Insufficient documentation

## 2012-04-22 MED ORDER — SODIUM CHLORIDE 0.9 % IV BOLUS (SEPSIS)
1000.0000 mL | Freq: Once | INTRAVENOUS | Status: AC
  Administered 2012-04-22: 1000 mL via INTRAVENOUS

## 2012-04-22 MED ORDER — CEPHALEXIN 500 MG PO CAPS: 500.0000 mg | ORAL_CAPSULE | Freq: Four times a day (QID) | ORAL | Status: DC

## 2012-04-22 MED ORDER — FAMOTIDINE 20 MG PO TABS: 20.0000 mg | ORAL_TABLET | Freq: Two times a day (BID) | ORAL | Status: DC

## 2012-04-22 MED ORDER — DIPHENHYDRAMINE HCL 50 MG/ML IJ SOLN
25.0000 mg | Freq: Once | INTRAMUSCULAR | Status: AC
  Administered 2012-04-22: 25 mg via INTRAVENOUS
  Filled 2012-04-22: qty 1

## 2012-04-22 MED ORDER — DEXAMETHASONE SODIUM PHOSPHATE 10 MG/ML IJ SOLN
10.0000 mg | Freq: Once | INTRAMUSCULAR | Status: AC
  Administered 2012-04-22: 10 mg via INTRAVENOUS
  Filled 2012-04-22: qty 1

## 2012-04-22 MED ORDER — FAMOTIDINE IN NACL 20-0.9 MG/50ML-% IV SOLN
20.0000 mg | Freq: Once | INTRAVENOUS | Status: AC
  Administered 2012-04-22: 20 mg via INTRAVENOUS
  Filled 2012-04-22: qty 50

## 2012-04-22 MED ORDER — DIPHENHYDRAMINE HCL 25 MG PO TABS: 50.0000 mg | ORAL_TABLET | ORAL | Status: DC | PRN

## 2012-04-22 NOTE — ED Provider Notes (Signed)
History     CSN: [id number]  Arrival date & time 04/22/12  0117   None     Chief Complaint  Patient presents with  . Allergic Reaction    (Consider location/radiation/quality/duration/timing/severity/associated sxs/prior treatment) HPI  [name] is a 42 y.o. female complaining of pruritus and hives with a "itchy throat.". Patient took first dose of Bactrim for surgical site infection at 10:37 PM. Patient states she noticed the pruritus and hives at approximately 12:01 AM. Patient took 2 Benadryl with mild improvement in symptoms. Patient is taking the Bactrim for a presumed surgical site infection in the umbilical region status post laparoscopic lysis of adhesions. Patient denies shortness of breath, tongue swelling, lip swelling, dyspepsia or abdominal pain. Patient states the hives have now resolved after taking Benadryl.  Past Medical History  Diagnosis Date  . Fibroids, submucosal   . Anemia     Past Surgical History  Procedure Laterality Date  . Appendectomy    . Cesarean section  1994, 2002  . Tubal ligation  2002  . Wisdom tooth extraction    . Novasure ablation N/A 03/31/2012    Procedure: NOVASURE ABLATION;  Surgeon: Allie Bossier, MD;  Location: [facility];  Service: Gynecology;  Laterality: N/A;  . Laparoscopy N/A 03/31/2012    Procedure: LAPAROSCOPY OPERATIVE;  Surgeon: Allie Bossier, MD;  Location: [facility];  Service: Gynecology;  Laterality: N/A;  . Laparoscopic lysis of adhesions N/A 03/31/2012    Procedure: LAPAROSCOPIC LYSIS OF ADHESIONS;  Surgeon: Allie Bossier, MD;  Location: [facility];  Service: Gynecology;  Laterality: N/A;  . Dilation and curettage of uterus N/A 03/31/2012    Procedure: DILATATION AND CURETTAGE;  Surgeon: Allie Bossier, MD;  Location: [facility];  Service: Gynecology;  Laterality: N/A;    Family History  Problem Relation Age of Onset  . Fibroids Sister     History  Substance Use Topics  . Smoking status: Former Smoker    Quit date: 01/21/2011  .  Smokeless tobacco: Not on file  . Alcohol Use: Yes     Comment: social occasions    OB History   Grav Para Term Preterm Abortions TAB SAB Ect Mult Living   3 2 2  1  1   2       Review of Systems  Constitutional: Negative for fever.  Respiratory: Negative for shortness of breath.   Cardiovascular: Negative for chest pain.  Gastrointestinal: Negative for nausea, vomiting, abdominal pain and diarrhea.  Allergic/Immunologic:       Pruritus and hives  All other systems reviewed and are negative.    Allergies  Bactrim  Home Medications   Current Outpatient Rx  Name  Route  Sig  Dispense  Refill  . diphenhydrAMINE (BENADRYL) 25 MG tablet   Oral   Take 50 mg by mouth once as needed (for allergic reaction).         . ferrous gluconate (FERGON) 324 MG tablet   Oral   Take 324 mg by mouth daily with breakfast.         . HYDROcodone-acetaminophen (VICODIN) 5-500 MG per tablet   Oral   Take 1-2 tablets by mouth every 6 (six) hours as needed for pain.   30 tablet   0   . ibuprofen (ADVIL,MOTRIN) 800 MG tablet   Oral   Take 800 mg by mouth every 8 (eight) hours as needed for pain.         Marland Kitchen sulfamethoxazole-trimethoprim (BACTRIM DS,SEPTRA  DS) 800-160 MG per tablet   Oral   Take 1 tablet by mouth 2 (two) times daily.   20 tablet   0   . oxyCODONE-acetaminophen (PERCOCET/ROXICET) 5-325 MG per tablet   Oral   Take 1 tablet by mouth every 4 (four) hours as needed for pain.   20 tablet   0     BP 167/67  Pulse 89  Temp(Src) 98.1 F (36.7 C) (Oral)  Resp 24  Wt 228 lb (103.42 kg)  BMI 36.82 kg/m2  SpO2 100%  Physical Exam  Nursing note and vitals reviewed. Constitutional: She is oriented to person, place, and time. She appears well-developed and well-nourished. No distress.  HENT:  Head: Normocephalic and atraumatic.  Right Ear: External ear normal.  Mouth/Throat: Oropharynx is clear and moist.  Posterior pharynx is clearly visible, uvula nonedematous, no  angioedema, no tenderness to palpation under,.  Eyes: Conjunctivae and EOM are normal. Pupils are equal, round, and reactive to light.  Neck: Normal range of motion.  Cardiovascular: Normal rate.   Pulmonary/Chest: Effort normal and breath sounds normal. No stridor. No respiratory distress. She has no wheezes. She has no rales. She exhibits no tenderness.  Or tachypnea, no stridor, patient is declining comfortably, speaking in full sentences. No wheezing appreciated.  Abdominal: Soft. Bowel sounds are normal. She exhibits no distension and no mass. There is no tenderness. There is no rebound and no guarding.  Musculoskeletal: Normal range of motion.  Neurological: She is alert and oriented to person, place, and time.  Skin:  Hives, mild erythematous excoriated areas to bilateral arms and torso.  Small vertical well-healing surgical incision to below the umbilicus, there is no erythema or induration however it is quite tender to palpation.  Psychiatric: She has a normal mood and affect.    ED Course  Procedures (including critical care time)  Labs Reviewed - No data to display No results found.  Medications  dexamethasone (DECADRON) injection 10 mg (10 mg Intravenous Given 04/22/12 0342)  diphenhydrAMINE (BENADRYL) injection 25 mg (25 mg Intravenous Given 04/22/12 0342)  famotidine (PEPCID) IVPB 20 mg (0 mg Intravenous Stopped 04/22/12 0440)  sodium chloride 0.9 % bolus 1,000 mL (0 mLs Intravenous Stopped 04/22/12 0440)    1. Allergic reaction caused by a drug       MDM    [name] is a 42 y.o. female with allergic reaction to Bactrim, patient has not any anaphylaxis criteria and epinephrine is not indicated at this time. She will receive IV fluids, Benadryl, Pepcid and Solu-Medrol.  Patient was observed with significant improvement in her symptoms and no development of multisystem involvement or shortness of breath.  I will switch her antibiotic from Bactrim to Keflex and  advised her to inform all of her providers that she has a sulfa allergy.   Filed Vitals:   04/22/12 0119 04/22/12 0448  BP: 167/67 138/86  Pulse: 89 55  Temp: 98.1 F (36.7 C) 98.7 F (37.1 C)  TempSrc: Oral Oral  Resp: 24 20  Weight: 228 lb (103.42 kg)   SpO2: 100% 100%     Pt verbalized understanding and agrees with care plan. Outpatient follow-up and return precautions given.    Discharge Medication List as of 04/22/2012  4:17 AM    START taking these medications   Details  cephALEXin (KEFLEX) 500 MG capsule Take 1 capsule (500 mg total) by mouth 4 (four) times daily., Starting 04/22/2012, Until Discontinued, Print    !! diphenhydrAMINE (BENADRYL)  25 MG tablet Take 2 tablets (50 mg total) by mouth every 4 (four) hours as needed for itching., Starting 04/22/2012, Until Discontinued, Print    famotidine (PEPCID) 20 MG tablet Take 1 tablet (20 mg total) by mouth 2 (two) times daily., Starting 04/22/2012, Until Discontinued, Print     !! - Potential duplicate medications found. Please discuss with provider.            Wynetta Emery, PA-C 04/24/12 [phone]

## 2012-04-22 NOTE — Telephone Encounter (Signed)
See telephone call note. 

## 2012-04-22 NOTE — ED Notes (Addendum)
Pt took bactrim for infx of incision. Pt took at 2237. Pt woke up extremely itchy at 0000. Pt took 2 benadryl. Pt has hives all over body. Pt states her throat is itchy and feels like her skin is getting tight

## 2012-04-22 NOTE — ED Notes (Signed)
Pt states she took a dose of Bactrim about 2230 and fell asleep. Woke up at midnight with severe itching and welts all over her body. States she is itching severely including in her throat and around her eyes. Pt's O2 WNL. Took 2 Benadryl before coming to ED.

## 2012-04-24 ENCOUNTER — Telehealth: Payer: Self-pay | Admitting: *Deleted

## 2012-04-24 NOTE — Telephone Encounter (Signed)
[name] called and left a message on voice mail stating she was seen last Friday and got an antibiotic for an infection at surgical site- states the antibiotic gave her an allergic reaction and she went to the ER Saturday and they gave her some new medicines including an antibiotic and states she has gone back to being sore. States she has to work and one of the medicines states can cause dizziness and don't drive. States she needs to know if can drive/ or work and if so needs  A note if she can't work.  Called [facility] and she states the medicine that says can cause dizziness is the famotidine. States the percocet and vicodin make her sleepy. States not taking benadryl much as her allergic reaction itching is getting better.  Informed her I would need to talk to a doctor and call her back.  Disussed with Dr. Burnice LoganKatrinka Blazing and she reviewed patient's chart. Called patient back - and instructed her she can work, she can take famotidine-that it helps with allergic reaction.and is otc and many people take it and work.  Advised her to not take percocet or vicodin within 3 hours of going to work , and then resume when she returns. Can take ibuprofen at work. Offered patient an appointment to see Dr. Marice Potter this afternoon if she feels she can't work because fo the soreness. She declinied appointment for today.  Instructed patient to call back Wednesday to get appt. If she is not beginning to fell better because they she will have been on keflex since Sunday.  Patient voices understanding. Advised patient to not work /drive if she feels she cannot because of dizziness, sleepiness, or pain.

## 2012-04-25 NOTE — ED Provider Notes (Signed)
Medical screening examination/treatment/procedure(s) were performed by non-physician practitioner and as supervising physician I was immediately available for consultation/collaboration.  Demonie Kassa, MD 04/25/12 0617 

## 2012-05-15 ENCOUNTER — Ambulatory Visit (INDEPENDENT_AMBULATORY_CARE_PROVIDER_SITE_OTHER): Payer: Medicaid Other | Admitting: Obstetrics & Gynecology

## 2012-05-15 ENCOUNTER — Encounter: Payer: Self-pay | Admitting: Obstetrics & Gynecology

## 2012-05-15 VITALS — BP 133/83 | HR 88 | Temp 98.2°F | Ht 66.0 in | Wt 228.3 lb

## 2012-05-15 DIAGNOSIS — Z09 Encounter for follow-up examination after completed treatment for conditions other than malignant neoplasm: Secondary | ICD-10-CM

## 2012-05-15 NOTE — Progress Notes (Signed)
  Subjective:    Patient ID: [name], female    DOB: [birth year], 42 y.o.   MRN: [id number]  HPI 42 yo lady now 40 weeks po s/p l/s, d&c,  and Novasure ablation. She had about a week of post op bleeding. She had a 2 day period recently. She has not had sex since surgery. She has returned to work.   Review of Systems     Objective:   Physical Exam Well-healed incision Benign incision       Assessment & Plan:   Post op- stable RTC 1 year/prn sooner

## 2012-09-19 ENCOUNTER — Encounter (HOSPITAL_COMMUNITY): Payer: Self-pay | Admitting: Emergency Medicine

## 2012-09-19 ENCOUNTER — Emergency Department (HOSPITAL_COMMUNITY)
Admission: EM | Admit: 2012-09-19 | Discharge: 2012-09-19 | Disposition: A | Discharge status: Home / Self Care | Payer: Medicaid Other | Source: Home / Self Care | Attending: Family Medicine | Admitting: Family Medicine

## 2012-09-19 DIAGNOSIS — J029 Acute pharyngitis, unspecified: Secondary | ICD-10-CM

## 2012-09-19 MED ORDER — ONDANSETRON 4 MG PO TBDP
ORAL_TABLET | ORAL | Status: AC
  Filled 2012-09-19: qty 2

## 2012-09-19 MED ORDER — ONDANSETRON 4 MG PO TBDP
8.0000 mg | ORAL_TABLET | Freq: Once | ORAL | Status: AC
  Administered 2012-09-19: 8 mg via ORAL

## 2012-09-19 MED ORDER — CEFDINIR 300 MG PO CAPS: 300.0000 mg | ORAL_CAPSULE | Freq: Two times a day (BID) | ORAL | Status: DC

## 2012-09-19 NOTE — ED Notes (Signed)
Provided with phone. 

## 2012-09-19 NOTE — ED Provider Notes (Addendum)
CSN: [id number]     Arrival date & time 09/19/12  1516 History   First MD Initiated Contact with Patient 09/19/12 1623     Chief Complaint  Patient presents with  . Sore Throat  . Otalgia   (Consider location/radiation/quality/duration/timing/severity/associated sxs/prior Treatment) Patient is a 42 y.o. female presenting with pharyngitis and ear pain. The history is provided by the patient.  Sore Throat This is a new problem. The current episode started more than 2 days ago. The problem has been gradually worsening. Pertinent negatives include no chest pain and no abdominal pain. Associated symptoms comments: Right earache, no fever.. The symptoms are aggravated by swallowing.  Otalgia Associated symptoms: congestion and sore throat   Associated symptoms: no abdominal pain, no cough, no fever and no rash     Past Medical History  Diagnosis Date  . Fibroids, submucosal   . Anemia    Past Surgical History  Procedure Laterality Date  . Appendectomy    . Cesarean section  1994, 2002  . Tubal ligation  2002  . Wisdom tooth extraction    . Novasure ablation N/A 03/31/2012    Procedure: NOVASURE ABLATION;  Surgeon: Allie Bossier, MD;  Location: [facility];  Service: Gynecology;  Laterality: N/A;  . Laparoscopy N/A 03/31/2012    Procedure: LAPAROSCOPY OPERATIVE;  Surgeon: Allie Bossier, MD;  Location: [facility];  Service: Gynecology;  Laterality: N/A;  . Laparoscopic lysis of adhesions N/A 03/31/2012    Procedure: LAPAROSCOPIC LYSIS OF ADHESIONS;  Surgeon: Allie Bossier, MD;  Location: [facility];  Service: Gynecology;  Laterality: N/A;  . Dilation and curettage of uterus N/A 03/31/2012    Procedure: DILATATION AND CURETTAGE;  Surgeon: Allie Bossier, MD;  Location: [facility];  Service: Gynecology;  Laterality: N/A;   Family History  Problem Relation Age of Onset  . Fibroids Sister    History  Substance Use Topics  . Smoking status: Former Smoker    Quit date: 01/21/2011  . Smokeless tobacco: Not on file   . Alcohol Use: Yes     Comment: social occasions   OB History   Grav Para Term Preterm Abortions TAB SAB Ect Mult Living   3 2 2  1  1   2      Review of Systems  Constitutional: Negative.  Negative for fever.  HENT: Positive for ear pain, congestion and sore throat. Negative for trouble swallowing.   Respiratory: Negative for cough.   Cardiovascular: Negative for chest pain.  Gastrointestinal: Negative for abdominal pain.  Skin: Negative for rash.    Allergies  Bactrim  Home Medications   Current Outpatient Rx  Name  Route  Sig  Dispense  Refill  . cefdinir (OMNICEF) 300 MG capsule   Oral   Take 1 capsule (300 mg total) by mouth 2 (two) times daily.   20 capsule   0   . diphenhydrAMINE (BENADRYL) 25 MG tablet   Oral   Take 50 mg by mouth once as needed (for allergic reaction).         . diphenhydrAMINE (BENADRYL) 25 MG tablet   Oral   Take 2 tablets (50 mg total) by mouth every 4 (four) hours as needed for itching.   20 tablet   0   . famotidine (PEPCID) 20 MG tablet   Oral   Take 1 tablet (20 mg total) by mouth 2 (two) times daily.   30 tablet   0   . ferrous gluconate (FERGON) 324  MG tablet   Oral   Take 324 mg by mouth daily with breakfast.         . HYDROcodone-acetaminophen (VICODIN) 5-500 MG per tablet   Oral   Take 1-2 tablets by mouth every 6 (six) hours as needed for pain.   30 tablet   0   . ibuprofen (ADVIL,MOTRIN) 800 MG tablet   Oral   Take 800 mg by mouth every 8 (eight) hours as needed for pain.         Marland Kitchen oxyCODONE-acetaminophen (PERCOCET/ROXICET) 5-325 MG per tablet   Oral   Take 1 tablet by mouth every 4 (four) hours as needed for pain.   20 tablet   0   . sulfamethoxazole-trimethoprim (BACTRIM DS,SEPTRA DS) 800-160 MG per tablet   Oral   Take 1 tablet by mouth 2 (two) times daily.   20 tablet   0    BP 147/84  Pulse 88  Temp(Src) 98.5 F (36.9 C) (Oral)  Resp 18  SpO2 100% Physical Exam  Nursing note and vitals  reviewed. Constitutional: She is oriented to person, place, and time. She appears well-developed and well-nourished.  HENT:  Right Ear: Tympanic membrane and external ear normal. Tympanic membrane is not erythematous, not retracted and not bulging.  Left Ear: Tympanic membrane and external ear normal. Tympanic membrane is not erythematous, not retracted and not bulging.  Mouth/Throat: Oropharyngeal exudate present.  Neck: Normal range of motion. Neck supple.  Lymphadenopathy:    She has cervical adenopathy.  Neurological: She is alert and oriented to person, place, and time.  Skin: Skin is warm and dry. No rash noted.    ED Course  Procedures (including critical care time) Labs Review Labs Reviewed  POCT RAPID STREP A ([facility] ONLY)   Imaging Review No results found.  MDM   1. Pharyngitis, acute       Linna Hoff, MD 09/19/12 1636  Linna Hoff, MD 09/19/12 [phone]

## 2012-09-19 NOTE — ED Notes (Signed)
C/o right right ear ache.  Onset last Thursday of symptoms.  Currently starting to have nausea.

## 2012-09-20 NOTE — ED Notes (Signed)
Called, c/o Rx too expensive, asking for something less expensive. Spoke w Dr Artis Flock, who authorized amoxicillin 500 mg PO TID , #30 . Called in to [facility] [address] at pt request

## 2012-09-20 NOTE — ED Notes (Signed)
Chart review.

## 2012-09-21 LAB — CULTURE, GROUP A STREP

## 2012-12-07 ENCOUNTER — Emergency Department (INDEPENDENT_AMBULATORY_CARE_PROVIDER_SITE_OTHER)
Admission: EM | Admit: 2012-12-07 | Discharge: 2012-12-07 | Disposition: A | Discharge status: Home / Self Care | Payer: Medicaid Other | Source: Home / Self Care

## 2012-12-07 ENCOUNTER — Encounter (HOSPITAL_COMMUNITY): Payer: Self-pay | Admitting: Emergency Medicine

## 2012-12-07 DIAGNOSIS — J020 Streptococcal pharyngitis: Secondary | ICD-10-CM

## 2012-12-07 LAB — POCT RAPID STREP A: Streptococcus, Group A Screen (Direct): NEGATIVE

## 2012-12-07 MED ORDER — AMOXICILLIN 500 MG PO CAPS: 500.0000 mg | ORAL_CAPSULE | Freq: Three times a day (TID) | ORAL | Status: DC

## 2012-12-07 NOTE — ED Provider Notes (Signed)
CSN: [id number]     Arrival date & time 12/07/12  1513 History   None    Chief Complaint  Patient presents with  . Sore Throat   (Consider location/radiation/quality/duration/timing/severity/associated sxs/prior Treatment) Patient is a 42 y.o. female presenting with pharyngitis. The history is provided by the patient.  Sore Throat This is a new problem. The current episode started more than 1 week ago. The problem has been gradually worsening. The symptoms are aggravated by swallowing.    Past Medical History  Diagnosis Date  . Fibroids, submucosal   . Anemia    Past Surgical History  Procedure Laterality Date  . Appendectomy    . Cesarean section  1994, 2002  . Tubal ligation  2002  . Wisdom tooth extraction    . Novasure ablation N/A 03/31/2012    Procedure: NOVASURE ABLATION;  Surgeon: Allie Bossier, MD;  Location: [facility];  Service: Gynecology;  Laterality: N/A;  . Laparoscopy N/A 03/31/2012    Procedure: LAPAROSCOPY OPERATIVE;  Surgeon: Allie Bossier, MD;  Location: [facility];  Service: Gynecology;  Laterality: N/A;  . Laparoscopic lysis of adhesions N/A 03/31/2012    Procedure: LAPAROSCOPIC LYSIS OF ADHESIONS;  Surgeon: Allie Bossier, MD;  Location: [facility];  Service: Gynecology;  Laterality: N/A;  . Dilation and curettage of uterus N/A 03/31/2012    Procedure: DILATATION AND CURETTAGE;  Surgeon: Allie Bossier, MD;  Location: [facility];  Service: Gynecology;  Laterality: N/A;   Family History  Problem Relation Age of Onset  . Fibroids Sister    History  Substance Use Topics  . Smoking status: Former Smoker    Quit date: 01/21/2011  . Smokeless tobacco: Not on file  . Alcohol Use: Yes     Comment: social occasions   OB History   Grav Para Term Preterm Abortions TAB SAB Ect Mult Living   3 2 2  1  1   2      Review of Systems  Constitutional: Negative.   HENT: Positive for sore throat. Negative for rhinorrhea.   Respiratory: Negative.   Cardiovascular: Negative.     Allergies   Bactrim  Home Medications   Current Outpatient Rx  Name  Route  Sig  Dispense  Refill  . diphenhydrAMINE (BENADRYL) 25 MG tablet   Oral   Take 2 tablets (50 mg total) by mouth every 4 (four) hours as needed for itching.   20 tablet   0   . ferrous gluconate (FERGON) 324 MG tablet   Oral   Take 324 mg by mouth daily with breakfast.         . ketorolac (TORADOL) 10 MG tablet   Oral   Take 1 tablet (10 mg total) by mouth every 6 (six) hours as needed.   20 tablet   0    BP 127/75  Pulse 59  Temp(Src) 98.3 F (36.8 C) (Oral)  Resp 20  SpO2 100% Physical Exam  Nursing note and vitals reviewed. Constitutional: She is oriented to person, place, and time. She appears well-developed and well-nourished. No distress.  HENT:  Right Ear: External ear normal.  Left Ear: External ear normal.  Mouth/Throat: Oropharyngeal exudate present.  Eyes: Conjunctivae are normal. Pupils are equal, round, and reactive to light.  Neck: Normal range of motion. Neck supple.  Pulmonary/Chest: Breath sounds normal.  Lymphadenopathy:    She has cervical adenopathy.  Neurological: She is oriented to person, place, and time.  Skin: Skin is warm and dry.  ED Course  Procedures (including critical care time) Labs Review Labs Reviewed  CULTURE, GROUP A STREP  POCT RAPID STREP A ([facility] ONLY)   Imaging Review No results found.  EKG Interpretation    Date/Time:    Ventricular Rate:    PR Interval:    QRS Duration:   QT Interval:    QTC Calculation:   R Axis:     Text Interpretation:              MDM  Strep neg but daughter with strep 2wk ago.    Linna Hoff, MD 12/24/12 [phone]

## 2012-12-07 NOTE — ED Notes (Signed)
Pt c/o sore throat onset 1 week Sxs also include: right ear pain, HA, f/n, hurts to swallow Denies: v/d Alert w/no signs of acute distress.

## 2012-12-09 LAB — CULTURE, GROUP A STREP

## 2012-12-21 ENCOUNTER — Encounter (HOSPITAL_COMMUNITY): Payer: Self-pay | Admitting: *Deleted

## 2012-12-21 ENCOUNTER — Inpatient Hospital Stay (HOSPITAL_COMMUNITY)
Admission: AD | Admit: 2012-12-21 | Discharge: 2012-12-21 | Disposition: A | Discharge status: Home / Self Care | Payer: Medicaid Other | Source: Ambulatory Visit | Attending: Obstetrics and Gynecology | Admitting: Obstetrics and Gynecology

## 2012-12-21 ENCOUNTER — Inpatient Hospital Stay (HOSPITAL_COMMUNITY): Payer: Medicaid Other

## 2012-12-21 DIAGNOSIS — N882 Stricture and stenosis of cervix uteri: Secondary | ICD-10-CM | POA: Insufficient documentation

## 2012-12-21 DIAGNOSIS — N9489 Other specified conditions associated with female genital organs and menstrual cycle: Secondary | ICD-10-CM

## 2012-12-21 DIAGNOSIS — R1032 Left lower quadrant pain: Secondary | ICD-10-CM | POA: Insufficient documentation

## 2012-12-21 DIAGNOSIS — N938 Other specified abnormal uterine and vaginal bleeding: Secondary | ICD-10-CM | POA: Insufficient documentation

## 2012-12-21 DIAGNOSIS — N949 Unspecified condition associated with female genital organs and menstrual cycle: Secondary | ICD-10-CM

## 2012-12-21 LAB — CBC
HCT: 33.8 % — ABNORMAL LOW (ref 36.0–46.0)
Hemoglobin: 11.7 g/dL — ABNORMAL LOW (ref 12.0–15.0)
MCH: 26.7 pg (ref 26.0–34.0)
MCHC: 34.6 g/dL (ref 30.0–36.0)
RBC: 4.38 MIL/uL (ref 3.87–5.11)

## 2012-12-21 LAB — URINALYSIS, ROUTINE W REFLEX MICROSCOPIC
Bilirubin Urine: NEGATIVE
Glucose, UA: NEGATIVE mg/dL
Ketones, ur: NEGATIVE mg/dL
Specific Gravity, Urine: 1.025 (ref 1.005–1.030)
pH: 6 (ref 5.0–8.0)

## 2012-12-21 LAB — URINE MICROSCOPIC-ADD ON

## 2012-12-21 MED ORDER — OXYCODONE-ACETAMINOPHEN 5-325 MG PO TABS
2.0000 | ORAL_TABLET | Freq: Once | ORAL | Status: AC
  Administered 2012-12-21: 2 via ORAL
  Filled 2012-12-21 (×2): qty 2

## 2012-12-21 MED ORDER — LORAZEPAM 1 MG PO TABS
1.0000 mg | ORAL_TABLET | Freq: Once | ORAL | Status: AC | PRN
  Administered 2012-12-21: 1 mg via ORAL
  Filled 2012-12-21: qty 1

## 2012-12-21 MED ORDER — KETOROLAC TROMETHAMINE 10 MG PO TABS: 10.0000 mg | ORAL_TABLET | Freq: Four times a day (QID) | ORAL | Status: DC | PRN

## 2012-12-21 MED ORDER — KETOROLAC TROMETHAMINE 60 MG/2ML IM SOLN
60.0000 mg | Freq: Once | INTRAMUSCULAR | Status: AC | PRN
  Administered 2012-12-21: 60 mg via INTRAMUSCULAR
  Filled 2012-12-21: qty 2

## 2012-12-21 NOTE — MAU Provider Note (Signed)
Chief Complaint: Abdominal Pain  First Provider Initiated Contact with Patient 12/21/12 0219     SUBJECTIVE HPI: [name] is a 42 y.o. [id number] female who presents with progressively worsening left lower quadrant pain x3 days. No history of similar pain. Concerned that something is wrong with her ovary. Pain had been adequately controlled with ibuprofen until tonight. Last dose of 400 mg at midnight. Unable to sleep. Pain 9/9 a pain scale at worst. 7/10 now. Slightly better with knees bent.  Menstrual period Started the same day as the pain. Has had light, 3-day periods since endometrial ablation in March of this year. No new sex partners. Denies fever, chills, vaginal discharge, urinary complaints, GI complaints. Declines GC/CT.   Past Medical History  Diagnosis Date  . Fibroids, submucosal   . Anemia    OB History  Gravida Para Term Preterm AB SAB TAB Ectopic Multiple Living  3 2 2  1 1    2     # Outcome Date GA Lbr Len/2nd Weight Sex Delivery Anes PTL Lv  3 TRM           2 TRM           1 SAB              Past Surgical History  Procedure Laterality Date  . Appendectomy    . Cesarean section  1994, 2002  . Tubal ligation  2002  . Wisdom tooth extraction    . Novasure ablation N/A 03/31/2012    Procedure: NOVASURE ABLATION;  Surgeon: Allie Bossier, MD;  Location: [facility];  Service: Gynecology;  Laterality: N/A;  . Laparoscopy N/A 03/31/2012    Procedure: LAPAROSCOPY OPERATIVE;  Surgeon: Allie Bossier, MD;  Location: [facility];  Service: Gynecology;  Laterality: N/A;  . Laparoscopic lysis of adhesions N/A 03/31/2012    Procedure: LAPAROSCOPIC LYSIS OF ADHESIONS;  Surgeon: Allie Bossier, MD;  Location: [facility];  Service: Gynecology;  Laterality: N/A;  . Dilation and curettage of uterus N/A 03/31/2012    Procedure: DILATATION AND CURETTAGE;  Surgeon: Allie Bossier, MD;  Location: [facility];  Service: Gynecology;  Laterality: N/A;   History   Social History  . Marital Status: Single     Spouse Name: N/A    Number of Children: N/A  . Years of Education: N/A   Occupational History  . Not on file.   Social History Main Topics  . Smoking status: Former Smoker    Quit date: 01/21/2011  . Smokeless tobacco: Not on file  . Alcohol Use: Yes     Comment: social occasions  . Drug Use: No  . Sexual Activity: Not Currently    Birth Control/ Protection: Condom, Surgical   Other Topics Concern  . Not on file   Social History Narrative  . No narrative on file   No current facility-administered medications on file prior to encounter.   Current Outpatient Prescriptions on File Prior to Encounter  Medication Sig Dispense Refill  . ferrous gluconate (FERGON) 324 MG tablet Take 324 mg by mouth daily with breakfast.      . diphenhydrAMINE (BENADRYL) 25 MG tablet Take 2 tablets (50 mg total) by mouth every 4 (four) hours as needed for itching.  20 tablet  0   Allergies  Allergen Reactions  . Bactrim [Sulfamethoxazole-Tmp Ds] Itching    ROS: Pertinent items in HPI  OBJECTIVE Blood pressure 126/62, pulse 55, temperature 97.9 F (36.6 C), temperature source Oral, resp.  rate 18. GENERAL: Well-developed, well-nourished female in moderate distress.  HEENT: Normocephalic HEART: normal rate RESP: normal effort ABDOMEN: Soft, mild low abdominal tenderness. Positive bowel sounds x4. EXTREMITIES: Nontender, no edema NEURO: Alert and oriented SPECULUM EXAM: NEFG, physiologic discharge, no blood noted, cervix clean, tightly closed os.  BIMANUAL: cervix closed; uterus slightly enlarged, NT, no adnexal tenderness or masses. Mild CMT.   LAB RESULTS Results for orders placed during the hospital encounter of 12/21/12 (from the past 24 hour(s))  URINALYSIS, ROUTINE W REFLEX MICROSCOPIC     Status: Abnormal   Collection Time    12/21/12  1:00 AM      Result Value Range   Color, Urine YELLOW  YELLOW   APPearance CLEAR  CLEAR   Specific Gravity, Urine 1.025  1.005 - 1.030   pH 6.0   5.0 - 8.0   Glucose, UA NEGATIVE  NEGATIVE mg/dL   Hgb urine dipstick TRACE (*) NEGATIVE   Bilirubin Urine NEGATIVE  NEGATIVE   Ketones, ur NEGATIVE  NEGATIVE mg/dL   Protein, ur NEGATIVE  NEGATIVE mg/dL   Urobilinogen, UA 0.2  0.0 - 1.0 mg/dL   Nitrite NEGATIVE  NEGATIVE   Leukocytes, UA NEGATIVE  NEGATIVE  URINE MICROSCOPIC-ADD ON     Status: None   Collection Time    12/21/12  1:00 AM      Result Value Range   Squamous Epithelial / LPF RARE  RARE   WBC, UA 0-2  <3 WBC/hpf   RBC / HPF 0-2  <3 RBC/hpf   Bacteria, UA RARE  RARE  CBC     Status: Abnormal   Collection Time    12/21/12  2:50 AM      Result Value Range   WBC 6.0  4.0 - 10.5 K/uL   RBC 4.38  3.87 - 5.11 MIL/uL   Hemoglobin 11.7 (*) 12.0 - 15.0 g/dL   HCT 21.3 (*) 08.6 - 57.8 %   MCV 77.2 (*) 78.0 - 100.0 fL   MCH 26.7  26.0 - 34.0 pg   MCHC 34.6  30.0 - 36.0 g/dL   RDW 46.9  62.9 - 52.8 %   Platelets 223  150 - 400 K/uL    IMAGING US Transvaginal Non-ob  12/21/2012   CLINICAL DATA:  Left lower quadrant pain, status post endometrial ablation in March of 2014.  EXAM: TRANSABDOMINAL AND TRANSVAGINAL ULTRASOUND OF PELVIS  TECHNIQUE: Both transabdominal and transvaginal ultrasound examinations of the pelvis were performed. Transabdominal technique was performed for global imaging of the pelvis including uterus, ovaries, adnexal regions, and pelvic cul-de-sac. It was necessary to proceed with endovaginal exam following the transabdominal exam to visualize the endometrium and adnexal.  COMPARISON:  02/21/2011  FINDINGS: Uterus  Measurements: 9.4 x 5.4 x 6.1 cm. No fibroids or other mass visualized.  Endometrium  Markedly distended with endoluminal fluid and debris,, with a complex fluid pocket measuring up to 3.6 x 3.3 x 3.5 cm. Associated 1.6 cm echogenic nonvascular focus may reflect mineralization or retracted blood clot.  Right ovary  Measurements: 3.7 x 1.9 x 2.0 cm. Normal appearance/no adnexal mass.  Left ovary   Measurements: 2.1 x 0.8 x 1.3 cm. Normal appearance/no adnexal mass.  Other findings  No free fluid.  IMPRESSION: Complex fluid/blood and debris distending the endometrial cavity.   Electronically Signed   By: Jearld Lesch M.D.   On: 12/21/2012 03:23   US Pelvis Complete  12/21/2012   CLINICAL DATA:  Left lower quadrant pain, status  post endometrial ablation in March of 2014.  EXAM: TRANSABDOMINAL AND TRANSVAGINAL ULTRASOUND OF PELVIS  TECHNIQUE: Both transabdominal and transvaginal ultrasound examinations of the pelvis were performed. Transabdominal technique was performed for global imaging of the pelvis including uterus, ovaries, adnexal regions, and pelvic cul-de-sac. It was necessary to proceed with endovaginal exam following the transabdominal exam to visualize the endometrium and adnexal.  COMPARISON:  02/21/2011  FINDINGS: Uterus  Measurements: 9.4 x 5.4 x 6.1 cm. No fibroids or other mass visualized.  Endometrium  Markedly distended with endoluminal fluid and debris,, with a complex fluid pocket measuring up to 3.6 x 3.3 x 3.5 cm. Associated 1.6 cm echogenic nonvascular focus may reflect mineralization or retracted blood clot.  Right ovary  Measurements: 3.7 x 1.9 x 2.0 cm. Normal appearance/no adnexal mass.  Left ovary  Measurements: 2.1 x 0.8 x 1.3 cm. Normal appearance/no adnexal mass.  Other findings  No free fluid.  IMPRESSION: Complex fluid/blood and debris distending the endometrial cavity.   Electronically Signed   By: Jearld Lesch M.D.   On: 12/21/2012 03:23    MAU COURSE Percocet given. Pain 5/5 on pain scale after work. Repeat ultrasound findings with patient and Dr. Emelda Fear. Offered patient expected management versus attempting to use cervical dilator to release the retained menstrual blood. Patient does not think she can tolerate expected management. Will further medicate patient with Toradol and Ativan and attempt to open the cervix with a dilator.   PROCEDURE R/B/I of  cervical dilation reviewed including relief of pain, release of retained menstrual blood, accidental uterine perforation, pain during procedure and failure or procedure to relief pain. Pt gave verbal and written consent. Time out performed.   Pt placed in lithotomy position. Sterile speculum placed. Cervix cleaned x 2 w/ betadine. ~7 mm graduated cervical dilator passed through cervical os to 6 cm until pt experienced moderate cramping. Small, steady stream of dark red blood w/ small black clots passed through cervix. ~4 more passes made w/ good until scant return observed. Pt tolerated well. Reported significant improvement in pain after procedure.   ASSESSMENT 1. Retained menstruation   2. Stenosis of cervix     PLAN Discharge home in stable condition. F.U [place] in 1 month--prior to F/U appt.      Follow-up Information   Follow up with [facility] In 1 month. (or sooner as needed if symptoms worsen)    Specialty:  Obstetrics and Gynecology   Contact information:   [address] [place] [address] [phone]      Follow up with [facility] ADMISS[facility]. (As needed if symptoms worsen)    Contact information:   [id number] [place] [address] [phone]       Medication List    STOP taking these medications       amoxicillin 500 MG capsule  Commonly known as:  AMOXIL     cefdinir 300 MG capsule  Commonly known as:  OMNICEF     famotidine 20 MG tablet  Commonly known as:  PEPCID     HYDROcodone-acetaminophen 5-500 MG per tablet  Commonly known as:  VICODIN     ibuprofen 800 MG tablet  Commonly known as:  ADVIL,MOTRIN     oxyCODONE-acetaminophen 5-325 MG per tablet  Commonly known as:  PERCOCET/ROXICET     sulfamethoxazole-trimethoprim 800-160 MG per tablet  Commonly known as:  BACTRIM DS,SEPTRA DS      TAKE these medications  diphenhydrAMINE 25 MG tablet  Commonly known as:   BENADRYL  Take 2 tablets (50 mg total) by mouth every 4 (four) hours as needed for itching.     ferrous gluconate 324 MG tablet  Commonly known as:  FERGON  Take 324 mg by mouth daily with breakfast.     ketorolac 10 MG tablet  Commonly known as:  TORADOL  Take 1 tablet (10 mg total) by mouth every 6 (six) hours as needed.         [place], CNM 12/21/2012  7:57 AM

## 2012-12-21 NOTE — MAU Provider Note (Signed)
Attestation of Attending Supervision of Advanced Practitioner: Evaluation and management procedures were performed by the PA/NP/CNM/OB Fellow under my supervision/collaboration. Chart reviewed and agree with management and plan.  [name] 12/21/2012 11:19 AM

## 2012-12-21 NOTE — MAU Note (Signed)
Pt states she has been having pain on her left side. Period came on Sunday and pain has been getting worse every since. Pt states she has been taking Ibuprofen.last taken at 0005 400mg 

## 2012-12-21 NOTE — Progress Notes (Signed)
Pt pain is coming in waves. Pt states pain gets worse intermittently

## 2013-01-09 ENCOUNTER — Emergency Department (HOSPITAL_COMMUNITY)
Admission: EM | Admit: 2013-01-09 | Discharge: 2013-01-10 | Disposition: A | Discharge status: Home / Self Care | Payer: Medicaid Other | Attending: Emergency Medicine | Admitting: Emergency Medicine

## 2013-01-09 ENCOUNTER — Emergency Department (HOSPITAL_COMMUNITY): Payer: Medicaid Other

## 2013-01-09 ENCOUNTER — Encounter: Payer: Self-pay | Admitting: *Deleted

## 2013-01-09 DIAGNOSIS — N920 Excessive and frequent menstruation with regular cycle: Secondary | ICD-10-CM | POA: Insufficient documentation

## 2013-01-09 DIAGNOSIS — R1310 Dysphagia, unspecified: Secondary | ICD-10-CM | POA: Insufficient documentation

## 2013-01-09 DIAGNOSIS — Z8742 Personal history of other diseases of the female genital tract: Secondary | ICD-10-CM | POA: Insufficient documentation

## 2013-01-09 DIAGNOSIS — R599 Enlarged lymph nodes, unspecified: Secondary | ICD-10-CM | POA: Insufficient documentation

## 2013-01-09 DIAGNOSIS — N857 Hematometra: Secondary | ICD-10-CM | POA: Insufficient documentation

## 2013-01-09 DIAGNOSIS — Z87891 Personal history of nicotine dependence: Secondary | ICD-10-CM | POA: Insufficient documentation

## 2013-01-09 DIAGNOSIS — R102 Pelvic and perineal pain: Secondary | ICD-10-CM

## 2013-01-09 DIAGNOSIS — G8929 Other chronic pain: Secondary | ICD-10-CM | POA: Insufficient documentation

## 2013-01-09 DIAGNOSIS — D649 Anemia, unspecified: Secondary | ICD-10-CM | POA: Insufficient documentation

## 2013-01-09 DIAGNOSIS — R6889 Other general symptoms and signs: Secondary | ICD-10-CM | POA: Insufficient documentation

## 2013-01-09 DIAGNOSIS — R0989 Other specified symptoms and signs involving the circulatory and respiratory systems: Secondary | ICD-10-CM

## 2013-01-09 DIAGNOSIS — Z79899 Other long term (current) drug therapy: Secondary | ICD-10-CM | POA: Insufficient documentation

## 2013-01-09 DIAGNOSIS — H9209 Otalgia, unspecified ear: Secondary | ICD-10-CM | POA: Insufficient documentation

## 2013-01-09 LAB — POCT I-STAT, CHEM 8
BUN: 12 mg/dL (ref 6–23)
Chloride: 104 mEq/L (ref 96–112)
HCT: 35 % — ABNORMAL LOW (ref 36.0–46.0)
Potassium: 3.5 mEq/L (ref 3.5–5.1)

## 2013-01-09 MED ORDER — MORPHINE SULFATE 4 MG/ML IJ SOLN
4.0000 mg | Freq: Once | INTRAMUSCULAR | Status: AC
  Administered 2013-01-09: 4 mg via INTRAVENOUS
  Filled 2013-01-09: qty 1

## 2013-01-09 MED ORDER — IOHEXOL 300 MG/ML  SOLN
100.0000 mL | Freq: Once | INTRAMUSCULAR | Status: AC | PRN
  Administered 2013-01-09: 100 mL via INTRAVENOUS

## 2013-01-09 MED ORDER — ONDANSETRON HCL 4 MG/2ML IJ SOLN
4.0000 mg | Freq: Once | INTRAMUSCULAR | Status: AC
  Administered 2013-01-09: 4 mg via INTRAVENOUS
  Filled 2013-01-09: qty 2

## 2013-01-09 MED ORDER — SODIUM CHLORIDE 0.9 % IV BOLUS (SEPSIS)
1000.0000 mL | Freq: Once | INTRAVENOUS | Status: AC
  Administered 2013-01-09: 1000 mL via INTRAVENOUS

## 2013-01-09 NOTE — ED Provider Notes (Signed)
CSN: [id number]     Arrival date & time 01/09/13  2048 History  This chart was scribed for Kimberly Favor, NP, working with [name], MD by Blanchard Kelch, ED Scribe. This patient was seen in room WTR6/WTR6 and the patient's care was started at 10:23 PM.     Chief Complaint  Patient presents with  . Sore Throat    Patient is a 42 y.o. female presenting with pharyngitis. The history is provided by the patient. No language interpreter was used.  Sore Throat This is a recurrent problem. The current episode started more than 1 week ago. The problem occurs constantly. The problem has not changed since onset.The symptoms are aggravated by swallowing.    HPI Comments: [name] is a 42 y.o. female who presents to the Emergency Department complaining of an intermittent right sided sore throat that began three months ago. The pain is worsened by swallowing. She is also complaining of left sided ear pain. She has been to Urgent Care twice and has had two failed courses of antibiotics prescribed by providers there. She denies that any blood work was done.   Past Medical History  Diagnosis Date  . Fibroids, submucosal   . Anemia    Past Surgical History  Procedure Laterality Date  . Appendectomy    . Cesarean section  1994, 2002  . Tubal ligation  2002  . Wisdom tooth extraction    . Novasure ablation N/A 03/31/2012    Procedure: NOVASURE ABLATION;  Surgeon: Allie Bossier, MD;  Location: [facility];  Service: Gynecology;  Laterality: N/A;  . Laparoscopy N/A 03/31/2012    Procedure: LAPAROSCOPY OPERATIVE;  Surgeon: Allie Bossier, MD;  Location: [facility];  Service: Gynecology;  Laterality: N/A;  . Laparoscopic lysis of adhesions N/A 03/31/2012    Procedure: LAPAROSCOPIC LYSIS OF ADHESIONS;  Surgeon: Allie Bossier, MD;  Location: [facility];  Service: Gynecology;  Laterality: N/A;  . Dilation and curettage of uterus N/A 03/31/2012    Procedure: DILATATION AND CURETTAGE;  Surgeon: Allie Bossier, MD;   Location: [facility];  Service: Gynecology;  Laterality: N/A;   Family History  Problem Relation Age of Onset  . Fibroids Sister    History  Substance Use Topics  . Smoking status: Former Smoker    Quit date: 01/21/2011  . Smokeless tobacco: Not on file  . Alcohol Use: Yes     Comment: social occasions   OB History   Grav Para Term Preterm Abortions TAB SAB Ect Mult Living   3 2 2  1  1   2      Review of Systems  Constitutional: Negative for fever.  HENT: Positive for ear pain, sore throat and trouble swallowing.   All other systems reviewed and are negative.    Allergies  Bactrim  Home Medications   Current Outpatient Rx  Name  Route  Sig  Dispense  Refill  . ferrous gluconate (FERGON) 324 MG tablet   Oral   Take 324 mg by mouth daily with breakfast.         . ibuprofen (ADVIL,MOTRIN) 200 MG tablet   Oral   Take 800 mg by mouth every 6 (six) hours as needed (pain).         Marland Kitchen HYDROcodone-acetaminophen (NORCO/VICODIN) 5-325 MG per tablet   Oral   Take 1 tablet by mouth every 6 (six) hours as needed.   10 tablet   0    Triage Vitals: BP 124/84  Pulse 73  Temp(Src) 98.9 F (37.2 C) (Oral)  Resp 18  SpO2 100%  Physical Exam  Nursing note and vitals reviewed. HENT:  No mastoid tenderness. Enlarged tonsil on right with ulcer crater.  Neck:  Thyroid feels equal.   Lymphadenopathy:  Adenopathy left preauricular all to anterior cervical chain. Does not go into clavicular.    ED Course  Procedures (including critical care time)  DIAGNOSTIC STUDIES: Oxygen Saturation is 100% on room air, normal by my interpretation.    COORDINATION OF CARE: 10:25 PM - Patient verbalizes understanding and agrees with treatment plan.    Labs Review Labs Reviewed  CBC WITH DIFFERENTIAL - Abnormal; Notable for the following:    Hemoglobin 11.9 (*)    HCT 34.5 (*)    All other components within normal limits  POCT I-STAT, CHEM 8 - Abnormal; Notable for the following:     Hemoglobin 11.9 (*)    HCT 35.0 (*)    All other components within normal limits   Imaging Review Ct Soft Tissue Neck W Contrast  01/09/2013   CLINICAL DATA:  Persistent right-sided facial and neck pain. Sore throat and swelling.  EXAM: CT NECK WITH CONTRAST  TECHNIQUE: Multidetector CT imaging of the neck was performed using the standard protocol following the bolus administration of intravenous contrast.  CONTRAST:  OMNIPAQUE IOHEXOL 300 MG/ML  SOLN  COMPARISON:  CTA of the chest performed 02/21/2011  FINDINGS: The nasopharynx, oropharynx and hypopharynx are grossly unremarkable in appearance. The palatine tonsils appear relatively symmetric. There is no evidence of abscess. A few tiny tonsilloliths are noted within the left palatine tonsil. The adenoids are grossly unremarkable in appearance. The valleculae and piriform sinuses are within normal limits, slightly less distended on the left. The proximal trachea is unremarkable in appearance. Prevertebral soft tissues are normal in thickness.  There is no evidence of vascular compromise. The parapharyngeal fat planes are preserved. The parotid and submandibular glands are unremarkable in appearance. No significant cervical lymphadenopathy is seen. The thyroid gland is unremarkable in appearance. The visualized superior mediastinum is within normal limits. The visualized lung apices are clear. No abnormal focal contrast enhancement is seen.  The visualized portions of the brain are unremarkable in appearance. The visualized orbits are within normal limits. The visualized paranasal sinuses and mastoid air cells are well aerated. No acute osseous abnormalities are seen. A tiny degenerative osseous fragment is noted at the tip of the dens.  IMPRESSION: 1. No acute abnormality seen within the neck. 2. Few tiny tonsilloliths incidentally noted within the left palatine tonsil.   Electronically Signed   By: Roanna Raider M.D.   On: 01/09/2013 23:19     EKG Interpretation   None       MDM   1. Globus sensation     A long discussion with the patient about her CT scan results and her lab work.  I will refer her to ENT for follow up.  I will give her a short course of hydrocodone for discomfort.  I reassured the patient.  That does not seem to be any pathology.  That is causing her sensation of a foreign body in her throat  I personally performed the services described in this documentation, which was scribed in my presence. The recorded information has been reviewed and is accurate.     Arman Filter, NP 01/10/13 [phone]

## 2013-01-09 NOTE — ED Notes (Signed)
Pt c/o sore throat pain for 3 months. Pt has been to UC several times for same. Has been given abx. Pt states she still feels like there is infection in her throat. Pt also reports R ear pain. Pt with no acute distress.

## 2013-01-10 LAB — CBC WITH DIFFERENTIAL/PLATELET
Basophils Absolute: 0 10*3/uL (ref 0.0–0.1)
Basophils Relative: 1 % (ref 0–1)
Eosinophils Relative: 3 % (ref 0–5)
Hemoglobin: 11.9 g/dL — ABNORMAL LOW (ref 12.0–15.0)
Lymphocytes Relative: 39 % (ref 12–46)
MCH: 27 pg (ref 26.0–34.0)
MCHC: 34.5 g/dL (ref 30.0–36.0)
MCV: 78.4 fL (ref 78.0–100.0)
Monocytes Relative: 12 % (ref 3–12)
Neutro Abs: 3 10*3/uL (ref 1.7–7.7)
Neutrophils Relative %: 46 % (ref 43–77)
RBC: 4.4 MIL/uL (ref 3.87–5.11)
RDW: 13.1 % (ref 11.5–15.5)
WBC: 6.5 10*3/uL (ref 4.0–10.5)

## 2013-01-10 MED ORDER — HYDROCODONE-ACETAMINOPHEN 5-325 MG PO TABS: 1.0000 | ORAL_TABLET | Freq: Four times a day (QID) | ORAL | Status: DC | PRN

## 2013-01-12 NOTE — ED Provider Notes (Signed)
Medical screening examination/treatment/procedure(s) were performed by non-physician practitioner and as supervising physician I was immediately available for consultation/collaboration.  EKG Interpretation   None         Enid Skeens, MD 01/12/13 1148

## 2013-01-16 ENCOUNTER — Ambulatory Visit (HOSPITAL_COMMUNITY)
Admission: RE | Admit: 2013-01-16 | Discharge: 2013-01-16 | Disposition: A | Discharge status: Home / Self Care | Payer: Medicaid Other | Source: Ambulatory Visit | Attending: Advanced Practice Midwife | Admitting: Advanced Practice Midwife

## 2013-01-16 DIAGNOSIS — N949 Unspecified condition associated with female genital organs and menstrual cycle: Secondary | ICD-10-CM | POA: Insufficient documentation

## 2013-01-16 DIAGNOSIS — N9489 Other specified conditions associated with female genital organs and menstrual cycle: Secondary | ICD-10-CM

## 2013-01-16 DIAGNOSIS — N938 Other specified abnormal uterine and vaginal bleeding: Secondary | ICD-10-CM | POA: Insufficient documentation

## 2013-01-16 DIAGNOSIS — N882 Stricture and stenosis of cervix uteri: Secondary | ICD-10-CM | POA: Insufficient documentation

## 2013-01-16 DIAGNOSIS — N857 Hematometra: Secondary | ICD-10-CM | POA: Insufficient documentation

## 2013-02-12 ENCOUNTER — Ambulatory Visit (INDEPENDENT_AMBULATORY_CARE_PROVIDER_SITE_OTHER): Payer: Medicaid Other | Admitting: Family Medicine

## 2013-02-12 ENCOUNTER — Other Ambulatory Visit (HOSPITAL_COMMUNITY)
Admission: RE | Admit: 2013-02-12 | Discharge: 2013-02-12 | Disposition: A | Discharge status: Home / Self Care | Payer: Medicaid Other | Source: Ambulatory Visit | Attending: Family Medicine | Admitting: Family Medicine

## 2013-02-12 ENCOUNTER — Encounter: Payer: Self-pay | Admitting: Family Medicine

## 2013-02-12 VITALS — BP 128/87 | HR 74 | Temp 97.6°F | Ht 65.0 in | Wt 240.5 lb

## 2013-02-12 DIAGNOSIS — N857 Hematometra: Secondary | ICD-10-CM

## 2013-02-12 DIAGNOSIS — N920 Excessive and frequent menstruation with regular cycle: Secondary | ICD-10-CM | POA: Insufficient documentation

## 2013-02-12 DIAGNOSIS — G8929 Other chronic pain: Secondary | ICD-10-CM

## 2013-02-12 DIAGNOSIS — R102 Pelvic and perineal pain: Secondary | ICD-10-CM

## 2013-02-12 DIAGNOSIS — N949 Unspecified condition associated with female genital organs and menstrual cycle: Secondary | ICD-10-CM

## 2013-02-12 LAB — POCT PREGNANCY, URINE: Preg Test, Ur: NEGATIVE

## 2013-02-12 NOTE — Progress Notes (Signed)
Patient ID: [name], female   DOB: 12-26-1970, 43 y.o.   MRN: [id number] GYNECOLOGY OFFICE NOTE  Chief Complaint F/u of hematometra and mass found on [place].   Primary Care Physician: No PCP Per Patient  HPI:  [name] is a [id number] [id number] who presents today for f/u after hematometra following endometrial ablation.   - had continued dysfunctional bleeding - endometrial biopsy on 3/14 was normal - had endometrial ablation and laparascopic lysis of adhesions 3/14 for menorrhagia and chronic pelvic pain.  - did well initially post procedure - then started bleeding heavily again - had cramping pain and went to the MAU 12/14.  Found on [place] to have retained menstrual flow.  - had a dilation in the MAU - at that time found to have a 22mm vascular mass on [place] - told to f/u today for all of above - since that time has continued to bleed. Soaking through 6 or more pads in a day on her period - bleeding for 2-3 days, then off from 2-3 days and then back on again - having back and lower pelvic pain also  No fevers, chills, nausea, vomiting, diarrhea, constipation, chest pain, SOB,    PMHx:  Past Medical History  Diagnosis Date  . Fibroids, submucosal   . Anemia    OB History   Grav Para Term Preterm Abortions TAB SAB Ect Mult Living   3 2 2  1  1   2    2  previous c/s   Past Surgical History  Procedure Laterality Date  . Appendectomy    . Cesarean section  1994, 2002  . Tubal ligation  2002  . Wisdom tooth extraction    . Novasure ablation N/A 03/31/2012    Procedure: NOVASURE ABLATION;  Surgeon: Emily Filbert, MD;  Location: [facility];  Service: Gynecology;  Laterality: N/A;  . Laparoscopy N/A 03/31/2012    Procedure: LAPAROSCOPY OPERATIVE;  Surgeon: Emily Filbert, MD;  Location: [facility];  Service: Gynecology;  Laterality: N/A;  . Laparoscopic lysis of adhesions N/A 03/31/2012    Procedure: LAPAROSCOPIC LYSIS OF ADHESIONS;  Surgeon: Emily Filbert, MD;  Location: [facility];  Service:  Gynecology;  Laterality: N/A;  . Dilation and curettage of uterus N/A 03/31/2012    Procedure: DILATATION AND CURETTAGE;  Surgeon: Emily Filbert, MD;  Location: [facility];  Service: Gynecology;  Laterality: N/A;    FAMHx:  Family History  Problem Relation Age of Onset  . Fibroids Sister     SOCHx:   reports that she quit smoking about 2 years ago. She does not have any smokeless tobacco history on file. She reports that she drinks alcohol. She reports that she does not use illicit drugs.  ALLERGIES:  Allergies  Allergen Reactions  . Bactrim [Sulfamethoxazole-Tmp Ds] Itching    ROS: Pertinent ROS as seen in HPI. Otherwise negative.   HOME MEDS: Current Outpatient Prescriptions  Medication Sig Dispense Refill  . ferrous gluconate (FERGON) 324 MG tablet Take 324 mg by mouth daily with breakfast.       No current facility-administered medications for this visit.    LABS/IMAGING: No results found for this or any previous visit (from the past 48 hour(s)). No results found.  VITALS: BP 128/87  Pulse 74  Temp(Src) 97.6 F (36.4 C) (Oral)  Ht 5\' 5"  (1.651 m)  Wt 109.09 kg (240 lb 8 oz)  BMI 40.02 kg/m2  LMP 02/05/2013  EXAM: GENERAL: Well-developed, well-nourished female in  no acute distress.  HEENT: Normocephalic, atraumatic. Sclerae anicteric.  NECK: Supple.   LUNGS: Clear to auscultation bilaterally.  HEART: Regular rate and rhythm. ABDOMEN: Soft, nontender, nondistended. No organomegaly. PELVIC: Normal external female genitalia. Vagina is pink and rugated.  Normal discharge. Normal cervix contour. Uterus is normal in size. No adnexal mass or tenderness.  EXTREMITIES: No cyanosis, clubbing, or edema, 2+ distal pulses.     ASSESSMENT: Menorrhagia  Hematometra  Chronic pelvic pain in female  PLAN:  - discussed with pt the concern for a 76mm mass being either 1) malignancy 2) polyp 3) some sort of neovascular scar tissue.  - recommend endometrial biopsy today -  procedure done as below - sounded today to 5cm. At time of surgery, was sounded to 9cm and at time of MAU visit was sounded to 6cm. Concerning for possible obstruction possibly due to mass  - discussed with Dr. Ihor Dow - pt a candidate for robotic hysterectomy - will plan to schedule pre-op visit with her in the future and schedule surgery - f/u as scheduled.   ENDOMETRIAL BIOPSY     The indications for endometrial biopsy were reviewed.   Risks of the biopsy including cramping, bleeding, infection, uterine perforation, inadequate specimen and need for additional procedures  were discussed. The patient states she understands and agrees to undergo procedure today. Consent was signed. Time out was performed. Urine HCG was negative. A sterile speculum was placed in the patient's vagina and the cervix was prepped with Betadine. A single-toothed tenaculum was placed on the anterior lip of the cervix to stabilize it. The 3 mm pipelle was introduced into the endometrial cavity without difficulty to a depth of 5 cm, and a moderate amount of tissue was obtained and sent to pathology. The instruments were removed from the patient's vagina. Minimal bleeding from the cervix was noted. The patient tolerated the procedure well. Routine post-procedure instructions were given to the patient. The patient will be called with the results and given recommendations for further management.   Andras Grunewald, Eugenia Pancoast, MD

## 2013-02-15 ENCOUNTER — Encounter: Payer: Self-pay | Admitting: *Deleted

## 2013-02-15 ENCOUNTER — Telehealth: Payer: Self-pay

## 2013-02-15 NOTE — Telephone Encounter (Signed)
Message copied by Michel Harrow on Thu Feb 15, 2013  1:47 PM ------      Message from: Kassie Mends      Created: Thu Feb 15, 2013 12:44 PM       Hi ladies,             Can you please let the pt know that her biopsy looked good but as we suspected, it did not get cells from the top of the uterus?  No change in the current plan for a hysterectomy. That is still the right next step for her.               Thanks,        Keli ------

## 2013-02-15 NOTE — Telephone Encounter (Signed)
Called pt and informed pt that her biopsy looked good but as the provider had suspected, it did not get cells from the top of the uterus.  So there is  no change in the current plan for a hysterectomy. That the provider still feels that it is the right next step for her.  I advised pt that the front office will call her with an appt for a surgical consult for a hysterectomy.  Pt stated understanding with no further questions.

## 2013-03-05 ENCOUNTER — Encounter: Payer: Self-pay | Admitting: Obstetrics & Gynecology

## 2013-03-05 ENCOUNTER — Ambulatory Visit (INDEPENDENT_AMBULATORY_CARE_PROVIDER_SITE_OTHER): Payer: Medicaid Other | Admitting: Obstetrics & Gynecology

## 2013-03-05 VITALS — BP 131/86 | HR 62 | Temp 99.0°F | Ht 65.0 in | Wt 240.2 lb

## 2013-03-05 DIAGNOSIS — N949 Unspecified condition associated with female genital organs and menstrual cycle: Secondary | ICD-10-CM

## 2013-03-05 DIAGNOSIS — N8000 Endometriosis of the uterus, unspecified: Secondary | ICD-10-CM

## 2013-03-05 DIAGNOSIS — G8929 Other chronic pain: Secondary | ICD-10-CM

## 2013-03-05 DIAGNOSIS — R102 Pelvic and perineal pain: Principal | ICD-10-CM

## 2013-03-05 NOTE — Progress Notes (Signed)
Subjective:     Patient ID: [name], female   DOB: 1970/11/07, 43 y.o.   MRN: [id number]  HPI [id number] s/p c-section x2. Pt c/o severe pain since her endometrial ablation with episodes of hematometrea.  Pt reports that each month the pain is becoming more sever.  The bleeding is 'ok 3-5 days with heavy bleeding on the 2nd day'.  Pt has started having pain outside of her menses and her menses are unpredictable in timing.   Past Medical History  Diagnosis Date  . Fibroids, submucosal   . Anemia    Past Surgical History  Procedure Laterality Date  . Appendectomy    . Cesarean section  1994, 2002  . Tubal ligation  2002  . Wisdom tooth extraction    . Novasure ablation N/A 03/31/2012    Procedure: NOVASURE ABLATION;  Surgeon: Emily Filbert, MD;  Location: [facility];  Service: Gynecology;  Laterality: N/A;  . Laparoscopy N/A 03/31/2012    Procedure: LAPAROSCOPY OPERATIVE;  Surgeon: Emily Filbert, MD;  Location: [facility];  Service: Gynecology;  Laterality: N/A;  . Laparoscopic lysis of adhesions N/A 03/31/2012    Procedure: LAPAROSCOPIC LYSIS OF ADHESIONS;  Surgeon: Emily Filbert, MD;  Location: [facility];  Service: Gynecology;  Laterality: N/A;  . Dilation and curettage of uterus N/A 03/31/2012    Procedure: DILATATION AND CURETTAGE;  Surgeon: Emily Filbert, MD;  Location: [facility];  Service: Gynecology;  Laterality: N/A;   Current Outpatient Prescriptions on File Prior to Visit  Medication Sig Dispense Refill  . ferrous gluconate (FERGON) 324 MG tablet Take 324 mg by mouth daily with breakfast.       No current facility-administered medications on file prior to visit.   Allergies  Allergen Reactions  . Bactrim [Sulfamethoxazole-Tmp Ds] Itching   Family History  Problem Relation Age of Onset  . Fibroids Sister    History   Social History  . Marital Status: Single    Spouse Name: N/A    Number of Children: N/A  . Years of Education: N/A   Occupational History  . Not on file.   Social  History Main Topics  . Smoking status: Former Smoker    Quit date: 01/21/2011  . Smokeless tobacco: Not on file  . Alcohol Use: Yes     Comment: social occasions  . Drug Use: No  . Sexual Activity: Not Currently    Birth Control/ Protection: Condom, Surgical   Other Topics Concern  . Not on file   Social History Narrative  . No narrative on file        Review of Systems     Objective:   Physical Exam BP 131/86  Pulse 62  Temp(Src) 99 F (37.2 C) (Oral)  Ht 5\' 5"  (1.651 m)  Wt 240 lb 3.2 oz (108.954 kg)  BMI 39.97 kg/m2  LMP 02/05/2013 Pt in NAD Abd: well healed transverse incision; NT, ND GYN; deferred.  Done prev during visit with Dr. Olevia Bowens   Result: uterus 8-10 weeks sized; tender; mobile Adnexa: no masses  07/14/2011 PAP Adequacy Reason Satisfactory for evaluation, endocervical/transformation zone component PRESENT. Diagnosis NEGATIVE FOR INTRAEPITHELIAL LESIONS OR MALIGNANCY.  CBC    Component Value Date/Time   WBC 6.5 01/09/2013 2326   RBC 4.40 01/09/2013 2326   HGB 11.9* 01/09/2013 2333   HCT 35.0* 01/09/2013 2333   PLT 242 01/09/2013 2326   MCV 78.4 01/09/2013 2326   MCH 27.0 01/09/2013 2326   MCHC 34.5  01/09/2013 2326   RDW 13.1 01/09/2013 2326   LYMPHSABS 2.6 01/09/2013 2326   MONOABS 0.8 01/09/2013 2326   EOSABS 0.2 01/09/2013 2326   BASOSABS 0.0 01/09/2013 2326        01/06/2013 EXAM:  TRANSVAGINAL ULTRASOUND OF PELVIS  TECHNIQUE:  Transvaginal ultrasound examination of the pelvis was performed  including evaluation of the uterus, ovaries, adnexal regions, and  pelvic cul-de-sac.  COMPARISON: Pelvic sonogram 12/21/2012  FINDINGS:  Uterus  Measurements: Uterus measures 7 x 5 x 5 cm. There has been  significant interval decompression of blood products and complex  debris on the endometrial canal. There is still retained fluid in  the fundic canal. There is also a focal nodular lesion arising from  the posterior uterus, measuring  approximately 12 cm in diameter,  with color Doppler flow centrally. No is significant heterogeneity  or acoustic shadowing typical of submucosal fibroid. Other than the  focal abnormality, the endometrium is thin. Incidental nabothian  cyst.  Right ovary  Measurements: 2.9 x 1.6 x 2.2 cm. Normal appearance/no adnexal mass.  Left ovary  Measurements: 2.1 x 1.3 x 1.4 cm. Normal appearance/no adnexal mass.  Other findings: No free fluid  IMPRESSION:  1. 12 mm vascularized mass in the posterior uterine cavity which  could be polyp or neoplastic tissue.  2. Decreased volume of blood and debris from the endometrial canal,  now with small volume retained fluid in the fundic canal.  Assessment:     Pelvic pain- suspect andemyosis after endometrial ablation. Pt desires definitive treatment with hyst     Plan:     Patient desires surgical management with robotic assisted hysterectomy with bilateral salpingectomy.  The risks of surgery were discussed in detail with the patient including but not limited to: bleeding which may require transfusion or reoperation; infection which may require prolonged hospitalization or re-hospitalization and antibiotic therapy; injury to bowel, bladder, ureters and major vessels or other surrounding organs; need for additional procedures including laparotomy; thromboembolic phenomenon, incisional problems and other postoperative or anesthesia complications.  Patient was told that the likelihood that her condition and symptoms will be treated effectively with this surgical management was very high; the postoperative expectations were also discussed in detail. The patient also understands the alternative treatment options which were discussed in full. All questions were answered.  She already has a surgical date. She was told she may be called for a preoperative appointment about a week prior to surgery and will be given further preoperative instructions at that visit.  Printed patient education handouts about the procedure were given to the patient to review at home.

## 2013-03-05 NOTE — Patient Instructions (Signed)
Total Laparoscopic Hysterectomy A total laparoscopic hysterectomy is a minimally invasive surgery to remove your uterus and cervix. This surgery is performed by making several small cuts (incisions) in your abdomen. It can also be done with a thin, lighted tube (laparoscope) inserted into two small incisions in your lower abdomen. Your fallopian tubes and ovaries can be removed (bilateral salpingo-oophorectomy) during this surgery as well.Benefits of minimally invasive surgery include:  Less pain.  Less risk of blood loss.  Less risk of infection.  Quicker return to normal activities. LET [facility] CARE PROVIDER KNOW ABOUT:  Any allergies you have.  All medicines you are taking, including vitamins, herbs, eye drops, creams, and over-the-counter medicines.  Previous problems you or members of your family have had with the use of anesthetics.  Any blood disorders you have.  Previous surgeries you have had.  Medical conditions you have. RISKS AND COMPLICATIONS  Generally, this is a safe procedure. However, as with any procedure, complications can occur. Possible complications include:  Bleeding.  Blood clots in the legs or lung.  Infection.  Injury to surrounding organs.  Problems with anesthesia.  Early menopause symptoms (hot flashes, night sweats, insomnia).  Risk of conversion to an open abdominal incision. BEFORE THE PROCEDURE  Ask your health care provider about changing or stopping your regular medicines.  Do not take aspirin or blood thinners (anticoagulants) for 1 week before the surgery or as told by your health care provider.  Do not eat or drink anything for 8 hours before the surgery or as told by your health care provider.  Quit smoking if you smoke.  Arrange for a ride home after surgery and for someone to help you at home during recovery. PROCEDURE   You will be given antibiotic medicine.  An IV tube will be placed in your arm. You will be given  medicine to make you sleep (general anesthetic).  A gas (carbon dioxide) will be used to inflate your abdomen. This will allow your surgeon to look inside your abdomen, perform your surgery, and treat any other problems found if necessary.  Three or four small incisions (often less than 1/2 inch) will be made in your abdomen. One of these incisions will be made in the area of your belly button (navel). The laparoscope will be inserted into the incision. Your surgeon will look through the laparoscope while doing your procedure.  Other surgical instruments will be inserted through the other incisions.  Your uterus may be removed through your vagina or cut into small pieces and removed through the small incisions.  Your incisions will be closed. AFTER THE PROCEDURE  The gas will be released from inside your abdomen.  You will be taken to the recovery area where a nurse will watch and check your progress. Once you are awake, stable, and taking fluids well, without other problems, you will return to your room or be allowed to go home.  There is usually minimal discomfort following the surgery because the incisions are so small.  You will be given pain medicine while you are in the hospital and for when you go home. Document Released: 11/01/2006 Document Revised: 09/06/2012 Document Reviewed: 07/25/2012 [facility] Patient Information 2014 [place], [place].

## 2013-03-07 ENCOUNTER — Encounter: Payer: Medicaid Other | Admitting: Obstetrics & Gynecology

## 2013-04-06 NOTE — Patient Instructions (Addendum)
   Your procedure is scheduled on:  Monday, Mar 30  Enter through the [employer] [facility] at: 6 AM Pick up the phone at the desk and dial [phone] and inform us of your arrival.  Please call this number if you have any problems the morning of surgery: [phone]  Remember: Do not eat or drink after midnight:  Sunday Take these medicines the morning of surgery with a SIP OF WATER:  None  Do not wear jewelry, make-up, or FINGER nail polish No metal in your hair or on your body. Do not wear lotions, powders, perfumes.  You may wear deodorant.  Do not bring valuables to the hospital. Contacts, dentures or bridgework may not be worn into surgery.  Leave suitcase in the car. After Surgery it may be brought to your room. For patients being admitted to the hospital, checkout time is 11:00am the day of discharge.      Patients discharged on the day of surgery will not be allowed to drive home.    Home with daughter [place] cell [phone].

## 2013-04-09 ENCOUNTER — Encounter (HOSPITAL_COMMUNITY): Payer: Self-pay

## 2013-04-09 ENCOUNTER — Encounter (HOSPITAL_COMMUNITY)
Admission: RE | Admit: 2013-04-09 | Discharge: 2013-04-09 | Disposition: A | Discharge status: Home / Self Care | Payer: Medicaid Other | Source: Ambulatory Visit | Attending: Obstetrics & Gynecology | Admitting: Obstetrics & Gynecology

## 2013-04-09 DIAGNOSIS — Z01812 Encounter for preprocedural laboratory examination: Secondary | ICD-10-CM | POA: Insufficient documentation

## 2013-04-09 HISTORY — DX: Personal history of other medical treatment: Z92.89

## 2013-04-09 LAB — CBC
HEMATOCRIT: 37.2 % (ref 36.0–46.0)
Hemoglobin: 12.8 g/dL (ref 12.0–15.0)
MCH: 27.3 pg (ref 26.0–34.0)
MCHC: 34.4 g/dL (ref 30.0–36.0)
MCV: 79.3 fL (ref 78.0–100.0)
Platelets: 271 10*3/uL (ref 150–400)
RBC: 4.69 MIL/uL (ref 3.87–5.11)
RDW: 13 % (ref 11.5–15.5)
WBC: 5.5 10*3/uL (ref 4.0–10.5)

## 2013-04-09 NOTE — Pre-Procedure Instructions (Signed)
SDS BB History Log give to lab for history of blood transfusion at [facility] 02/2011.

## 2013-04-11 ENCOUNTER — Encounter (HOSPITAL_COMMUNITY): Payer: Self-pay | Admitting: Pharmacist

## 2013-04-15 MED ORDER — DEXTROSE 5 % IV SOLN
2.0000 g | INTRAVENOUS | Status: AC
  Administered 2013-04-16: 2 g via INTRAVENOUS
  Filled 2013-04-15: qty 2

## 2013-04-15 MED ORDER — DEXTROSE 5 % IV SOLN
2.0000 g | INTRAVENOUS | Status: DC
  Filled 2013-04-15: qty 2

## 2013-04-16 ENCOUNTER — Ambulatory Visit (HOSPITAL_COMMUNITY): Payer: Medicaid Other | Admitting: Anesthesiology

## 2013-04-16 ENCOUNTER — Encounter (HOSPITAL_COMMUNITY): Payer: Self-pay | Admitting: *Deleted

## 2013-04-16 ENCOUNTER — Encounter (HOSPITAL_COMMUNITY): Payer: Medicaid Other | Admitting: Anesthesiology

## 2013-04-16 ENCOUNTER — Encounter (HOSPITAL_COMMUNITY)
Admission: RE | Disposition: A | Discharge status: Home / Self Care | Payer: Self-pay | Source: Ambulatory Visit | Attending: Obstetrics & Gynecology

## 2013-04-16 ENCOUNTER — Ambulatory Visit (HOSPITAL_COMMUNITY)
Admission: RE | Admit: 2013-04-16 | Discharge: 2013-04-16 | Disposition: A | Discharge status: Home / Self Care | Payer: Medicaid Other | Source: Ambulatory Visit | Attending: Obstetrics & Gynecology | Admitting: Obstetrics & Gynecology

## 2013-04-16 DIAGNOSIS — N838 Other noninflammatory disorders of ovary, fallopian tube and broad ligament: Secondary | ICD-10-CM | POA: Insufficient documentation

## 2013-04-16 DIAGNOSIS — N8000 Endometriosis of the uterus, unspecified: Secondary | ICD-10-CM | POA: Insufficient documentation

## 2013-04-16 DIAGNOSIS — Z87891 Personal history of nicotine dependence: Secondary | ICD-10-CM | POA: Insufficient documentation

## 2013-04-16 DIAGNOSIS — D252 Subserosal leiomyoma of uterus: Secondary | ICD-10-CM | POA: Insufficient documentation

## 2013-04-16 DIAGNOSIS — N938 Other specified abnormal uterine and vaginal bleeding: Secondary | ICD-10-CM | POA: Insufficient documentation

## 2013-04-16 DIAGNOSIS — N840 Polyp of corpus uteri: Secondary | ICD-10-CM | POA: Insufficient documentation

## 2013-04-16 DIAGNOSIS — N857 Hematometra: Secondary | ICD-10-CM | POA: Insufficient documentation

## 2013-04-16 DIAGNOSIS — D250 Submucous leiomyoma of uterus: Secondary | ICD-10-CM | POA: Insufficient documentation

## 2013-04-16 DIAGNOSIS — Z9889 Other specified postprocedural states: Secondary | ICD-10-CM

## 2013-04-16 DIAGNOSIS — D649 Anemia, unspecified: Secondary | ICD-10-CM | POA: Insufficient documentation

## 2013-04-16 DIAGNOSIS — N920 Excessive and frequent menstruation with regular cycle: Secondary | ICD-10-CM | POA: Insufficient documentation

## 2013-04-16 DIAGNOSIS — R102 Pelvic and perineal pain: Secondary | ICD-10-CM

## 2013-04-16 DIAGNOSIS — G8929 Other chronic pain: Secondary | ICD-10-CM

## 2013-04-16 DIAGNOSIS — N949 Unspecified condition associated with female genital organs and menstrual cycle: Secondary | ICD-10-CM | POA: Insufficient documentation

## 2013-04-16 HISTORY — PX: ROBOTIC ASSISTED TOTAL HYSTERECTOMY: SHX6085

## 2013-04-16 HISTORY — PX: BILATERAL SALPINGECTOMY: SHX5743

## 2013-04-16 LAB — ABO/RH: ABO/RH(D): A POS

## 2013-04-16 LAB — PREGNANCY, URINE: Preg Test, Ur: NEGATIVE

## 2013-04-16 LAB — TYPE AND SCREEN
ABO/RH(D): A POS
ANTIBODY SCREEN: NEGATIVE

## 2013-04-16 SURGERY — ROBOTIC ASSISTED TOTAL HYSTERECTOMY
Anesthesia: General | Site: Abdomen

## 2013-04-16 MED ORDER — MIDAZOLAM HCL 5 MG/5ML IJ SOLN
INTRAMUSCULAR | Status: DC | PRN
  Administered 2013-04-16: 2 mg via INTRAVENOUS

## 2013-04-16 MED ORDER — MIDAZOLAM HCL 2 MG/2ML IJ SOLN
INTRAMUSCULAR | Status: AC
  Filled 2013-04-16: qty 2

## 2013-04-16 MED ORDER — KETOROLAC TROMETHAMINE 30 MG/ML IJ SOLN: 30.0000 mg | Freq: Four times a day (QID) | INTRAMUSCULAR | Status: DC

## 2013-04-16 MED ORDER — ONDANSETRON HCL 4 MG/2ML IJ SOLN: 4.0000 mg | Freq: Four times a day (QID) | INTRAMUSCULAR | Status: DC | PRN

## 2013-04-16 MED ORDER — GLYCOPYRROLATE 0.2 MG/ML IJ SOLN
INTRAMUSCULAR | Status: DC | PRN
  Administered 2013-04-16: 0.6 mg via INTRAVENOUS

## 2013-04-16 MED ORDER — PROPOFOL 10 MG/ML IV BOLUS
INTRAVENOUS | Status: DC | PRN
  Administered 2013-04-16: 150 mg via INTRAVENOUS

## 2013-04-16 MED ORDER — KETOROLAC TROMETHAMINE 30 MG/ML IJ SOLN
INTRAMUSCULAR | Status: DC | PRN
Start: 2013-04-16 — End: 2013-04-16
  Administered 2013-04-16: 30 mg via INTRAVENOUS

## 2013-04-16 MED ORDER — MORPHINE SULFATE 4 MG/ML IJ SOLN: 1.0000 mg | INTRAMUSCULAR | Status: DC | PRN

## 2013-04-16 MED ORDER — KETOROLAC TROMETHAMINE 30 MG/ML IJ SOLN: 15.0000 mg | Freq: Once | INTRAMUSCULAR | Status: DC | PRN

## 2013-04-16 MED ORDER — BUPIVACAINE HCL (PF) 0.5 % IJ SOLN
INTRAMUSCULAR | Status: AC
  Filled 2013-04-16: qty 30

## 2013-04-16 MED ORDER — ONDANSETRON HCL 4 MG/2ML IJ SOLN
INTRAMUSCULAR | Status: DC | PRN
  Administered 2013-04-16: 4 mg via INTRAVENOUS

## 2013-04-16 MED ORDER — LACTATED RINGERS IV SOLN: Freq: Once | INTRAVENOUS | Status: DC

## 2013-04-16 MED ORDER — LIDOCAINE HCL (CARDIAC) 20 MG/ML IV SOLN
INTRAVENOUS | Status: DC | PRN
  Administered 2013-04-16: 60 mg via INTRAVENOUS

## 2013-04-16 MED ORDER — PANTOPRAZOLE SODIUM 40 MG PO TBEC
40.0000 mg | DELAYED_RELEASE_TABLET | Freq: Every day | ORAL | Status: DC
  Filled 2013-04-16 (×2): qty 1

## 2013-04-16 MED ORDER — FENTANYL CITRATE 0.05 MG/ML IJ SOLN
INTRAMUSCULAR | Status: AC
  Administered 2013-04-16: 50 ug via INTRAVENOUS
  Filled 2013-04-16: qty 2

## 2013-04-16 MED ORDER — BUPIVACAINE HCL (PF) 0.5 % IJ SOLN
INTRAMUSCULAR | Status: DC | PRN
  Administered 2013-04-16: 30 mL

## 2013-04-16 MED ORDER — SUFENTANIL CITRATE 50 MCG/ML IV SOLN
INTRAVENOUS | Status: DC | PRN
  Administered 2013-04-16: 20 ug via INTRAVENOUS
  Administered 2013-04-16 (×3): 10 ug via INTRAVENOUS

## 2013-04-16 MED ORDER — PROMETHAZINE HCL 25 MG/ML IJ SOLN: 6.2500 mg | INTRAMUSCULAR | Status: DC | PRN

## 2013-04-16 MED ORDER — ROCURONIUM BROMIDE 100 MG/10ML IV SOLN
INTRAVENOUS | Status: DC | PRN
  Administered 2013-04-16: 50 mg via INTRAVENOUS
  Administered 2013-04-16: 10 mg via INTRAVENOUS

## 2013-04-16 MED ORDER — SUFENTANIL CITRATE 50 MCG/ML IV SOLN
INTRAVENOUS | Status: AC
  Filled 2013-04-16: qty 1

## 2013-04-16 MED ORDER — METHYLENE BLUE 1 % INJ SOLN
INTRAMUSCULAR | Status: DC | PRN
  Administered 2013-04-16: 5 mL via INTRAVENOUS

## 2013-04-16 MED ORDER — PROPOFOL 10 MG/ML IV EMUL
INTRAVENOUS | Status: AC
  Filled 2013-04-16: qty 20

## 2013-04-16 MED ORDER — IBUPROFEN 800 MG PO TABS: 800.0000 mg | ORAL_TABLET | Freq: Four times a day (QID) | ORAL | Status: DC

## 2013-04-16 MED ORDER — LACTATED RINGERS IV SOLN: INTRAVENOUS | Status: DC

## 2013-04-16 MED ORDER — NEOSTIGMINE METHYLSULFATE 1 MG/ML IJ SOLN
INTRAMUSCULAR | Status: AC
  Filled 2013-04-16: qty 1

## 2013-04-16 MED ORDER — IBUPROFEN 800 MG PO TABS
800.0000 mg | ORAL_TABLET | Freq: Four times a day (QID) | ORAL | Status: DC
  Administered 2013-04-16: 800 mg via ORAL
  Filled 2013-04-16: qty 1

## 2013-04-16 MED ORDER — MENTHOL 3 MG MT LOZG: 1.0000 | LOZENGE | OROMUCOSAL | Status: DC | PRN

## 2013-04-16 MED ORDER — IBUPROFEN 600 MG PO TABS: 600.0000 mg | ORAL_TABLET | Freq: Four times a day (QID) | ORAL | Status: DC | PRN

## 2013-04-16 MED ORDER — GLYCOPYRROLATE 0.2 MG/ML IJ SOLN
INTRAMUSCULAR | Status: AC
  Filled 2013-04-16: qty 4

## 2013-04-16 MED ORDER — OXYCODONE-ACETAMINOPHEN 5-325 MG PO TABS
1.0000 | ORAL_TABLET | ORAL | Status: DC | PRN
Start: 2013-04-16 — End: 2013-04-16
  Administered 2013-04-16: 2 via ORAL
  Filled 2013-04-16: qty 2

## 2013-04-16 MED ORDER — DOCUSATE SODIUM 100 MG PO CAPS: 100.0000 mg | ORAL_CAPSULE | Freq: Two times a day (BID) | ORAL | Status: DC

## 2013-04-16 MED ORDER — OXYCODONE-ACETAMINOPHEN 5-325 MG PO TABS: 1.0000 | ORAL_TABLET | Freq: Four times a day (QID) | ORAL | Status: DC | PRN

## 2013-04-16 MED ORDER — LACTATED RINGERS IR SOLN
Status: DC | PRN
  Administered 2013-04-16: 3000 mL

## 2013-04-16 MED ORDER — KETOROLAC TROMETHAMINE 30 MG/ML IJ SOLN
30.0000 mg | Freq: Four times a day (QID) | INTRAMUSCULAR | Status: DC
  Administered 2013-04-16: 30 mg via INTRAVENOUS
  Filled 2013-04-16: qty 1

## 2013-04-16 MED ORDER — DEXTROSE-NACL 5-0.45 % IV SOLN
INTRAVENOUS | Status: DC
  Administered 2013-04-16: 12:00:00 via INTRAVENOUS

## 2013-04-16 MED ORDER — MIDAZOLAM HCL 2 MG/2ML IJ SOLN: 0.5000 mg | Freq: Once | INTRAMUSCULAR | Status: DC | PRN

## 2013-04-16 MED ORDER — DEXAMETHASONE SODIUM PHOSPHATE 10 MG/ML IJ SOLN
INTRAMUSCULAR | Status: AC
  Filled 2013-04-16: qty 1

## 2013-04-16 MED ORDER — NEOSTIGMINE METHYLSULFATE 1 MG/ML IJ SOLN
INTRAMUSCULAR | Status: DC | PRN
  Administered 2013-04-16: 3 mg via INTRAVENOUS

## 2013-04-16 MED ORDER — ARTIFICIAL TEARS OP OINT
TOPICAL_OINTMENT | OPHTHALMIC | Status: DC | PRN
  Administered 2013-04-16: 1 via OPHTHALMIC

## 2013-04-16 MED ORDER — ONDANSETRON HCL 4 MG PO TABS: 4.0000 mg | ORAL_TABLET | Freq: Four times a day (QID) | ORAL | Status: DC | PRN

## 2013-04-16 MED ORDER — FENTANYL CITRATE 0.05 MG/ML IJ SOLN
25.0000 ug | INTRAMUSCULAR | Status: DC | PRN
  Administered 2013-04-16 (×2): 50 ug via INTRAVENOUS

## 2013-04-16 MED ORDER — KETOROLAC TROMETHAMINE 30 MG/ML IJ SOLN
INTRAMUSCULAR | Status: AC
  Filled 2013-04-16: qty 1

## 2013-04-16 MED ORDER — METHYLENE BLUE 1 % INJ SOLN
INTRAMUSCULAR | Status: AC
  Filled 2013-04-16: qty 10

## 2013-04-16 MED ORDER — LACTATED RINGERS IV SOLN
INTRAVENOUS | Status: DC
  Administered 2013-04-16 (×2): via INTRAVENOUS

## 2013-04-16 MED ORDER — STERILE WATER FOR IRRIGATION IR SOLN
Status: DC | PRN
  Administered 2013-04-16: 1000 mL

## 2013-04-16 MED ORDER — ONDANSETRON HCL 4 MG/2ML IJ SOLN
INTRAMUSCULAR | Status: AC
  Filled 2013-04-16: qty 2

## 2013-04-16 MED ORDER — MEPERIDINE HCL 25 MG/ML IJ SOLN: 6.2500 mg | INTRAMUSCULAR | Status: DC | PRN

## 2013-04-16 MED ORDER — SIMETHICONE 80 MG PO CHEW: 80.0000 mg | CHEWABLE_TABLET | Freq: Four times a day (QID) | ORAL | Status: DC | PRN

## 2013-04-16 MED ORDER — DEXAMETHASONE SODIUM PHOSPHATE 10 MG/ML IJ SOLN
INTRAMUSCULAR | Status: DC | PRN
  Administered 2013-04-16: 10 mg via INTRAVENOUS

## 2013-04-16 MED ORDER — DSS 100 MG PO CAPS: 100.0000 mg | ORAL_CAPSULE | Freq: Two times a day (BID) | ORAL | Status: DC

## 2013-04-16 MED ORDER — LIDOCAINE HCL (CARDIAC) 20 MG/ML IV SOLN
INTRAVENOUS | Status: AC
  Filled 2013-04-16: qty 5

## 2013-04-16 SURGICAL SUPPLY — 70 items
ADH SKN CLS APL DERMABOND .7 (GAUZE/BANDAGES/DRESSINGS) ×2
APL SKNCLS STERI-STRIP NONHPOA (GAUZE/BANDAGES/DRESSINGS) ×2
APPLIER CLIP 5 13 M/L LIGAMAX5 (MISCELLANEOUS)
APR CLP MED LRG 5 ANG JAW (MISCELLANEOUS)
BAG URINE DRAINAGE (UROLOGICAL SUPPLIES) ×4 IMPLANT
BARRIER ADHS 3X4 INTERCEED (GAUZE/BANDAGES/DRESSINGS) IMPLANT
BENZOIN TINCTURE PRP APPL 2/3 (GAUZE/BANDAGES/DRESSINGS) ×4 IMPLANT
BRR ADH 4X3 ABS CNTRL BYND (GAUZE/BANDAGES/DRESSINGS)
CATH FOLEY 3WAY  5CC 16FR (CATHETERS) ×2
CATH FOLEY 3WAY 5CC 16FR (CATHETERS) ×2 IMPLANT
CLIP APPLIE 5 13 M/L LIGAMAX5 (MISCELLANEOUS) IMPLANT
CLOSURE WOUND 1/2 X4 (GAUZE/BANDAGES/DRESSINGS) ×1
CLOTH BEACON ORANGE TIMEOUT ST (SAFETY) ×4 IMPLANT
CONT PATH 16OZ SNAP LID 3702 (MISCELLANEOUS) ×4 IMPLANT
COVER MAYO STAND STRL (DRAPES) ×4 IMPLANT
COVER TABLE BACK 60X90 (DRAPES) ×8 IMPLANT
COVER TIP SHEARS 8 DVNC (MISCELLANEOUS) ×2 IMPLANT
COVER TIP SHEARS 8MM DA VINCI (MISCELLANEOUS) ×2
DECANTER SPIKE VIAL GLASS SM (MISCELLANEOUS) ×4 IMPLANT
DERMABOND ADVANCED (GAUZE/BANDAGES/DRESSINGS) ×2
DERMABOND ADVANCED .7 DNX12 (GAUZE/BANDAGES/DRESSINGS) ×2 IMPLANT
DEVICE TROCAR PUNCTURE CLOSURE (ENDOMECHANICALS) IMPLANT
DRAPE HUG U DISPOSABLE (DRAPE) ×4 IMPLANT
DRAPE LG THREE QUARTER DISP (DRAPES) ×8 IMPLANT
DRAPE WARM FLUID 44X44 (DRAPE) ×4 IMPLANT
ELECT REM PT RETURN 9FT ADLT (ELECTROSURGICAL) ×4
ELECTRODE REM PT RTRN 9FT ADLT (ELECTROSURGICAL) ×2 IMPLANT
EVACUATOR SMOKE 8.L (FILTER) ×4 IMPLANT
GAUZE VASELINE 3X9 (GAUZE/BANDAGES/DRESSINGS) IMPLANT
GLOVE BIO SURGEON STRL SZ7 (GLOVE) ×8 IMPLANT
GLOVE BIOGEL PI IND STRL 7.0 (GLOVE) ×4 IMPLANT
GLOVE BIOGEL PI INDICATOR 7.0 (GLOVE) ×4
GOWN STRL REUS W/TWL LRG LVL3 (GOWN DISPOSABLE) ×8 IMPLANT
KIT ACCESSORY DA VINCI DISP (KITS) ×2
KIT ACCESSORY DVNC DISP (KITS) ×2 IMPLANT
LEGGING LITHOTOMY PAIR STRL (DRAPES) ×4 IMPLANT
NEEDLE INSUFFLATION 120MM (ENDOMECHANICALS) ×4 IMPLANT
OCCLUDER COLPOPNEUMO (BALLOONS) ×4 IMPLANT
PACK LAVH (CUSTOM PROCEDURE TRAY) ×4 IMPLANT
PAD PREP 24X48 CUFFED NSTRL (MISCELLANEOUS) ×8 IMPLANT
PLUG CATH AND CAP STER (CATHETERS) ×4 IMPLANT
PROTECTOR NERVE ULNAR (MISCELLANEOUS) ×8 IMPLANT
SET CYSTO W/LG BORE CLAMP LF (SET/KITS/TRAYS/PACK) IMPLANT
SET IRRIG TUBING LAPAROSCOPIC (IRRIGATION / IRRIGATOR) ×4 IMPLANT
SHEARS HARMONIC ACE PLUS 36CM (ENDOMECHANICALS) ×2 IMPLANT
SOLUTION ELECTROLUBE (MISCELLANEOUS) ×4 IMPLANT
STRIP CLOSURE SKIN 1/2X4 (GAUZE/BANDAGES/DRESSINGS) ×3 IMPLANT
SURGIFLO W/THROMBIN 8M KIT (HEMOSTASIS) IMPLANT
SUT VIC AB 0 CT1 27 (SUTURE) ×8
SUT VIC AB 0 CT1 27XBRD ANBCTR (SUTURE) ×4 IMPLANT
SUT VIC AB 0 CT1 27XBRD ANTBC (SUTURE) IMPLANT
SUT VICRYL 0 UR6 27IN ABS (SUTURE) ×8 IMPLANT
SUT VICRYL 4-0 PS2 18IN ABS (SUTURE) ×8 IMPLANT
SUT VLOC 180 0 9IN  GS21 (SUTURE) ×2
SUT VLOC 180 0 9IN GS21 (SUTURE) IMPLANT
SYR 50ML LL SCALE MARK (SYRINGE) ×4 IMPLANT
SYSTEM CONVERTIBLE TROCAR (TROCAR) ×2 IMPLANT
TIP RUMI ORANGE 6.7MMX12CM (TIP) IMPLANT
TIP UTERINE 5.1X6CM LAV DISP (MISCELLANEOUS) IMPLANT
TIP UTERINE 6.7X10CM GRN DISP (MISCELLANEOUS) IMPLANT
TIP UTERINE 6.7X6CM WHT DISP (MISCELLANEOUS) IMPLANT
TIP UTERINE 6.7X8CM BLUE DISP (MISCELLANEOUS) ×2 IMPLANT
TOWEL OR 17X24 6PK STRL BLUE (TOWEL DISPOSABLE) ×8 IMPLANT
TROCAR DILATING TIP 12MM 150MM (ENDOMECHANICALS) ×4 IMPLANT
TROCAR DISP BLADELESS 8 DVNC (TROCAR) ×4 IMPLANT
TROCAR DISP BLADELESS 8MM (TROCAR) ×4
TROCAR XCEL 12X100 BLDLESS (ENDOMECHANICALS) IMPLANT
TROCAR XCEL NON-BLD 5MMX100MML (ENDOMECHANICALS) ×4 IMPLANT
TUBING FILTER THERMOFLATOR (ELECTROSURGICAL) ×4 IMPLANT
WATER STERILE IRR 1000ML POUR (IV SOLUTION) ×12 IMPLANT

## 2013-04-16 NOTE — H&P (Signed)
HPI:  [name] is a 4RK [id number] who presents today for f/u after hematometra following endometrial ablation.  - had continued dysfunctional bleeding  - endometrial biopsy on 3/14 was normal  - had endometrial ablation and laparascopic lysis of adhesions 3/14 for menorrhagia and chronic pelvic pain.  - did well initially post procedure  - then started bleeding heavily again  - had cramping pain and went to the MAU 12/14. Found on [place] to have retained menstrual flow.  - had a dilation in the MAU  - at that time found to have a 74mm vascular mass on [place]  - told to f/u today for all of above  - since that time has continued to bleed. Soaking through 6 or more pads in a day on her period  - bleeding for 2-3 days, then off from 2-3 days and then back on again  - having back and lower pelvic pain also  No fevers, chills, nausea, vomiting, diarrhea, constipation, chest pain, SOB,  PMHx:  Past Medical History   Diagnosis  Date   .  Fibroids, submucosal    .  Anemia     OB History    Grav  Para  Term  Preterm  Abortions  TAB  SAB  Ect  Mult  Living    3  2  2   1   1    2     2  previous c/s  Past Surgical History   Procedure  Laterality  Date   .  Appendectomy     .  Cesarean section   1994, 2002   .  Tubal ligation   2002   .  Wisdom tooth extraction     .  Novasure ablation  N/A  03/31/2012     Procedure: NOVASURE ABLATION; Surgeon: Emily Filbert, MD; Location: [facility]; Service: Gynecology; Laterality: N/A;   .  Laparoscopy  N/A  03/31/2012     Procedure: LAPAROSCOPY OPERATIVE; Surgeon: Emily Filbert, MD; Location: [facility]; Service: Gynecology; Laterality: N/A;   .  Laparoscopic lysis of adhesions  N/A  03/31/2012     Procedure: LAPAROSCOPIC LYSIS OF ADHESIONS; Surgeon: Emily Filbert, MD; Location: [facility]; Service: Gynecology; Laterality: N/A;   .  Dilation and curettage of uterus  N/A  03/31/2012     Procedure: DILATATION AND CURETTAGE; Surgeon: Emily Filbert, MD; Location: [facility]; Service:  Gynecology; Laterality: N/A;   FAMHx:  Family History   Problem  Relation  Age of Onset   .  Fibroids  Sister    SOCHx:  reports that she quit smoking about 2 years ago. She does not have any smokeless tobacco history on file. She reports that she drinks alcohol. She reports that she does not use illicit drugs.  ALLERGIES:  Allergies   Allergen  Reactions   .  Bactrim [Sulfamethoxazole-Tmp Ds]  Itching   ROS:  Pertinent ROS as seen in HPI. Otherwise negative.  HOME MEDS:  Current Outpatient Prescriptions   Medication  Sig  Dispense  Refill   .  ferrous gluconate (FERGON) 324 MG tablet  Take 324 mg by mouth daily with breakfast.      No current facility-administered medications for this visit.   LABS/IMAGING:  No results found for this or any previous visit (from the past 48 hour(s)).  No results found.  VITALS:  BP 138/77  Pulse 61  Temp(Src) 98.2 F (36.8 C) (Oral)  Resp 20  SpO2 100%  EXAM:  GENERAL: Well-developed, well-nourished female in no acute distress.  HEENT: Normocephalic, atraumatic. Sclerae anicteric.  NECK: Supple.  LUNGS: Clear to auscultation bilaterally.  HEART: Regular rate and rhythm.  ABDOMEN: Soft, nontender, nondistended. No organomegaly.  PELVIC: Normal external female genitalia. Vagina is pink and rugated. Normal discharge. Normal cervix contour. Uterus is normal in size. No adnexal mass or tenderness.  EXTREMITIES: No cyanosis, clubbing, or edema, 2+ distal pulses.   CBC    Component Value Date/Time   WBC 5.5 04/09/2013 1320   RBC 4.69 04/09/2013 1320   HGB 12.8 04/09/2013 1320   HCT 37.2 04/09/2013 1320   PLT 271 04/09/2013 1320   MCV 79.3 04/09/2013 1320   MCH 27.3 04/09/2013 1320   MCHC 34.4 04/09/2013 1320   RDW 13.0 04/09/2013 1320   LYMPHSABS 2.6 01/09/2013 2326   MONOABS 0.8 01/09/2013 2326   EOSABS 0.2 01/09/2013 2326   BASOSABS 0.0 01/09/2013 2326     ASSESSMENT:  Menorrhagia  Hematometra  Chronic pelvic pain in female  PLAN:   Patient desires surgical management with robotic toal laparoscopic hysterectomy with bilateral salpingectomy.  The risks of surgery were discussed in detail with the patient including but not limited to: bleeding which may require transfusion or reoperation; infection which may require prolonged hospitalization or re-hospitalization and antibiotic therapy; injury to bowel, bladder, ureters and major vessels or other surrounding organs; need for additional procedures including laparotomy; thromboembolic phenomenon, incisional problems and other postoperative or anesthesia complications.  Patient was told that the likelihood that her condition and symptoms will be treated effectively with this surgical management was very high; the postoperative expectations were also discussed in detail. The patient also understands the alternative treatment options which were discussed in full. All questions were answered.

## 2013-04-16 NOTE — Brief Op Note (Signed)
04/16/2013  10:00 AM  PATIENT:  [name]  43 y.o. female  PRE-OPERATIVE DIAGNOSIS:  cpt 58150 and 58700 - Menorrhagia, hematometra and chronic pelvic pain in female  POST-OPERATIVE DIAGNOSIS:  menorrhagia,hematometria,pelvic pain  PROCEDURE:  Procedure(s): ROBOTIC ASSISTED TOTAL HYSTERECTOMY (N/A) BILATERAL SALPINGECTOMY (Bilateral) Cystoscopy- diagnostic  SURGEON:  Surgeon(s) and Role:    * Lavonia Drafts, MD - Primary    * Donnamae Jude, MD - Assisting   ANESTHESIA:   general  EBL:  Total I/O In: 1000 [I.V.:1000] Out: 825 [Urine:750; Blood:75]  BLOOD ADMINISTERED:none  DRAINS: none   LOCAL MEDICATIONS USED:  MARCAINE     SPECIMEN:  Source of Specimen:  uterus, cervix, bilateral fallopian tubes   DISPOSITION OF SPECIMEN:  PATHOLOGY  COUNTS:  YES  TOURNIQUET:  * No tourniquets in log *  DICTATION: .Note written in EPIC  PLAN OF CARE: Prolonged observation and discharge later today  PATIENT DISPOSITION:  PACU - hemodynamically stable.   Delay start of Pharmacological VTE agent (>24hrs) due to surgical blood loss or risk of bleeding: not applicable

## 2013-04-16 NOTE — Anesthesia Preprocedure Evaluation (Addendum)
Anesthesia Evaluation  Patient identified by MRN, date of birth, ID band Patient awake    Reviewed: Allergy & Precautions, H&P , Patient's Chart, lab work & pertinent test results  Airway Mallampati: II TM Distance: >3 FB Neck ROM: full    Dental   Pulmonary former smoker,  breath sounds clear to auscultation        Cardiovascular Rhythm:regular Rate:Normal     Neuro/Psych    GI/Hepatic   Endo/Other  Morbid obesity  Renal/GU      Musculoskeletal   Abdominal   Peds  Hematology  (+) anemia ,   Anesthesia Other Findings   Reproductive/Obstetrics (+) Pregnancy                          Anesthesia Physical Anesthesia Plan  ASA: III  Anesthesia Plan: General ETT   Post-op Pain Management:    Induction:   Airway Management Planned:   Additional Equipment:   Intra-op Plan:   Post-operative Plan:   Informed Consent: I have reviewed the patients History and Physical, chart, labs and discussed the procedure including the risks, benefits and alternatives for the proposed anesthesia with the patient or authorized representative who has indicated his/her understanding and acceptance.     Plan Discussed with:   Anesthesia Plan Comments:       2 piv.  A-line set up may or may not use. Anesthesia Quick Evaluation

## 2013-04-16 NOTE — Discharge Instructions (Signed)
Total Laparoscopic Hysterectomy, Care After  Refer to this sheet in the next few weeks. These instructions provide you with information on caring for yourself after your procedure. Your health care provider may also give you more specific instructions. Your treatment has been planned according to current medical practices, but problems sometimes occur. Call your health care provider if you have any problems or questions after your procedure.  WHAT TO EXPECT AFTER THE PROCEDURE  · Pain and bruising at the incision sites. You will be given pain medicine to control it.  · Menopausal symptoms such as hot flashes, night sweats, and insomnia if your ovaries were removed.  · Sore throat from the breathing tube that was inserted during surgery.  HOME CARE INSTRUCTIONS  · Only take over-the-counter or prescription medicines for pain, discomfort, or fever as directed by your health care provider.    · Do not take aspirin. It can cause bleeding.    · Do not drive when taking pain medicine.    · Follow your health care provider's advice regarding diet, exercise, lifting, driving, and general activities.    · Resume your usual diet as directed and allowed.    · Get plenty of rest and sleep.    · Do not douche, use tampons, or have sexual intercourse for at least 6 weeks, or until your health care provider gives you permission.    · Change your bandages (dressings) as directed by your health care provider.    · Monitor your temperature and notify your health care provider of a fever.    · Take showers instead of baths for 2 3 weeks.    · Do not drink alcohol until your health care provider gives you permission.    · If you develop constipation, you may take a mild laxative with your health care provider's permission. Bran foods may help with constipation problems. Drinking enough fluids to keep your urine clear or pale yellow may help as well.    · Try to have someone home with you for 1 2 weeks to help around the house.     · Keep all of your follow-up appointments as directed by your health care provider.    SEEK MEDICAL CARE IF:  · You have swelling, redness, or increasing pain around your incision sites.    · You have pus coming from your incision.    · You notice a bad smell coming from your incision.    · Your incision breaks open.    · You feel dizzy or lightheaded.    · You have pain or bleeding when you urinate.    · You have persistent diarrhea.    · You have persistent nausea and vomiting.    · You have abnormal vaginal discharge.    · You have a rash.    · You have any type of abnormal reaction or develop an allergy to your medicine.    · You have poor pain control with your prescribed medicine.    SEEK IMMEDIATE MEDICAL CARE IF:  · You have chest pain or shortness of breath.  · You have severe abdominal pain that is not relieved with pain medicine.  · You have pain or swelling in your legs.  Document Released: 10/25/2012 Document Reviewed: 07/25/2012  [employer]® Patient Information ©2014 [employer].

## 2013-04-16 NOTE — Transfer of Care (Signed)
Immediate Anesthesia Transfer of Care Note  Patient: [name]  Procedure(s) Performed: Procedure(s): ROBOTIC ASSISTED TOTAL HYSTERECTOMY (N/A) BILATERAL SALPINGECTOMY (Bilateral)  Patient Location: PACU  Anesthesia Type:General  Level of Consciousness: sedated  Airway & Oxygen Therapy: Patient Spontanous Breathing and Patient connected to nasal cannula oxygen  Post-op Assessment: Report given to PACU RN and Post -op Vital signs reviewed and stable  Post vital signs: stable  Complications: No apparent anesthesia complications

## 2013-04-16 NOTE — Anesthesia Postprocedure Evaluation (Signed)
  Anesthesia Post-op Note  Patient: [name]  Procedure(s) Performed: Procedure(s): ROBOTIC ASSISTED TOTAL HYSTERECTOMY (N/A) BILATERAL SALPINGECTOMY (Bilateral) Patient is awake and responsive. Pain and nausea are reasonably well controlled. Vital signs are stable and clinically acceptable. Oxygen saturation is clinically acceptable. There are no apparent anesthetic complications at this time. Patient is ready for discharge.

## 2013-04-16 NOTE — Anesthesia Postprocedure Evaluation (Signed)
  Anesthesia Post-op Note  Patient: [name]  Procedure(s) Performed: Procedure(s): ROBOTIC ASSISTED TOTAL HYSTERECTOMY (N/A) BILATERAL SALPINGECTOMY (Bilateral)  Patient Location: [facility]  Anesthesia Type:General  Level of Consciousness: awake  Airway and Oxygen Therapy: Patient Spontanous Breathing  Post-op Pain: mild  Post-op Assessment: Patient's Cardiovascular Status Stable and Respiratory Function Stable  Post-op Vital Signs: stable  Complications: No apparent anesthesia complications

## 2013-04-16 NOTE — Addendum Note (Signed)
Addendum created 04/16/13 1519 by Ignacia Bayley, CRNA   Modules edited: Notes Section   Notes Section:  File: [id number]

## 2013-04-16 NOTE — Op Note (Signed)
04/16/2013  10:00 AM  PATIENT:  [name]  43 y.o. female  PRE-OPERATIVE DIAGNOSIS:  cpt 58150 and 58700 - Menorrhagia, hematometra and chronic pelvic pain in female  POST-OPERATIVE DIAGNOSIS:  menorrhagia,hematometria,pelvic pain  PROCEDURE:  Procedure(s): ROBOTIC ASSISTED TOTAL HYSTERECTOMY (N/A) BILATERAL SALPINGECTOMY (Bilateral) Cystoscopy- diagnostic  SURGEON:  Surgeon(s) and Role:    * Lavonia Drafts, MD - Primary    * Donnamae Jude, MD - Assisting   ANESTHESIA:   general  EBL:  Total I/O In: 1000 [I.V.:1000] Out: 825 [Urine:750; Blood:75]  BLOOD ADMINISTERED:none  DRAINS: none   LOCAL MEDICATIONS USED:  MARCAINE     SPECIMEN:  Source of Specimen:  uterus, cervix, bilateral fallopian tubes   DISPOSITION OF SPECIMEN:  PATHOLOGY  COUNTS:  YES  TOURNIQUET:  * No tourniquets in log *  DICTATION: .Note written in EPIC  PLAN OF CARE: Prolonged observation and discharge later today  PATIENT DISPOSITION:  PACU - hemodynamically stable.   Delay start of Pharmacological VTE agent (>24hrs) due to surgical blood loss or risk of bleeding: not applicable Findings: Uterus with fibroids; normal fallopian tubes.  Bladder scared to lower uterine segment and bowel adhesion.   Pt is a 43yo [id number] with h/o pelvic pain due to a hematocolpos. The risks, benefits, and alternatives of surgery were explained, understood, and accepted. Consents were signed. All questions were answered. She was taken to the operating room and general anesthesia was applied without complication. She was placed in the dorsal lithotomy position and her abdomen and vagina were prepped and draped after she had been carefully positioned on the table. A bimanual exam revealed a 12-14 week size uterus that was mobile. Her adnexa were not enlarged. The cervix was measured and the uterus was sounded to 10 cm. A Rumi uterine manipulator was placed without difficulty. A Foley catheter was placed and it  drained clear throughout the case. Gloves were changed and attention was turned to the abdomen. A 34mm incision was made 6 cm above the umbilicus and a Veress needle was placed intraperitoneally. CO2 was used to insufflate the abdomen to approximately 4 L. After good pneumoperitoneum was established, a 12 mm trocar was placed in the umbilicus.  Laparoscopy confirmed correct placement. She was placed in Trendelenburg position and ports were placed in appropriate positions on her abdomen to allow maximum exposure during the robotic case. Specifically there was an 68mm assistant port placed in the left upper quadrant under direct laproscopic visualization. Two 8 mm ports were placed 8cm lateral to the midline port.  These were all placed under direct laparoscopic visualization. Omentum was adherent to the anterior abdominal wall which was taken down with the Harmonic scalpel. Excellent hemostasis was noted. The robot was docked and I proceeded with a robotic portion of the case.  The pelvis was inspected and the uterus was found to have fibroids and be enlarged.  The fallopian tubes and ovaries were found to be normal. The remainder of her pelvis appeared normal with the exception of adhesions of bowel to the adnexa and sidewall on the right  side.  These were released sharply. The ureters and the infundibulopelvic ligaments were identified. I excised the fallopian tubes bilaterally. The round ligament on each side was cauterized and cut. The PK/gyrus instrument was used for this portion. The round ligaments were identified, cauterized and ligated.The bladder was densely adherent to the lower uterine segment and was filled with 300cc of NS to identify the borders.  A bladder flap  was then created anteriorly.The bladder was pushed out of the operative site and an anterior colpotomy was made.  The uterine vessels were identified and cauterized and then cut.The colpotomy incision was extended circumferentially, following  the blue outline of the Rumi manipulator. All pedicles were hemostatic.  The uterus was removed from the vagina with the fallopian tube segments. The vaginal cuff was closed with v-lock suture.  Excellent hemostasis was noted throughout. The pelvis was irrigated. Methylene blue was given by anesthesia.  The intraabdominal pressure was lowered assess hemostasis. After determining excellent hemostasis, the robot was undocked. At this point I performed cystoscopy. The cystoscopy revealed blue ejection from both ureters.  The midline fascial incision was closed with 0 vicryl.  The skin from all of the other ports was closed with 3-0 vicryl. 30cc of 0.5% Marcaine was injected into the port sites.  The patient was then extubated and taken to recovery in stable condition.  There were no immediate complications.   Sponge, lap and needle counts were correct x 2.

## 2013-04-17 ENCOUNTER — Telehealth: Payer: Self-pay | Admitting: Obstetrics & Gynecology

## 2013-04-17 ENCOUNTER — Encounter (HOSPITAL_COMMUNITY): Payer: Self-pay | Admitting: Obstetrics & Gynecology

## 2013-04-17 NOTE — Discharge Summary (Signed)
Physician Discharge Summary  Patient ID: [name] MRN: [id number] DOB/AGE: 09-01-1970 43 y.o.  Admit date: 04/16/2013 Discharge date: 04/17/2013  Admission Diagnoses: adenomyosis; female- pelvic pain Discharge Diagnoses: adenomyosis; female- pelvic pain Active Problems:   Postoperative state   Discharged Condition: good  Hospital Course: Pt was admitted for surgery.  She was sent to the [facility] unit for prolonged observation.  She had adequate pain control managed by po pain meds.  She was voiding without difficulty post and tolerating po.  She expressed that she was ready for discharge.  She had no complaints   Consults: None  Significant Diagnostic Studies: labs: CBC  Treatments: IV hydration and surgery: Robot Assisted Total Laparoscopic Hysterectomy with bilateral salpingectomy and Lysis of adhesion with cystoscopy   Discharge Exam: Blood pressure 124/64, pulse 64, temperature 98.7 F (37.1 C), temperature source Oral, resp. rate 18, height 5\' 5"  (1.651 m), weight 240 lb (108.863 kg), SpO2 100.00%. General appearance: alert and no distress  Disposition: 01-Home or Self Care  Discharge Orders   Future Appointments Provider Department Dept Phone   05/02/2013 2:30 PM Lavonia Drafts, MD [facility] [phone]   Future Orders Complete By Expires   Call MD for:  difficulty breathing, headache or visual disturbances  As directed    Call MD for:  extreme fatigue  As directed    Call MD for:  hives  As directed    Call MD for:  persistant dizziness or light-headedness  As directed    Call MD for:  persistant nausea and vomiting  As directed    Call MD for:  redness, tenderness, or signs of infection (pain, swelling, redness, odor or green/yellow discharge around incision site)  As directed    Call MD for:  severe uncontrolled pain  As directed    Call MD for:  temperature >100.4  As directed    Diet - low sodium heart healthy  As directed    Discharge  wound care:  As directed    Comments:     Keep port sites clean and dry   Driving Restrictions  As directed    Comments:     No driving while on pain medication or for 2 weeks post op   Increase activity slowly  As directed    Lifting restrictions  As directed    Comments:     No heavy lifting   Sexual Activity Restrictions  As directed    Comments:     No sexual activity for 8 weeks.  NO exceptions. NOTHING in vagina for 8 weeks!       Medication List         DSS 100 MG Caps  Take 100 mg by mouth 2 (two) times daily.     ferrous gluconate 324 MG tablet  Commonly known as:  FERGON  Take 324 mg by mouth daily with breakfast.     ibuprofen 600 MG tablet  Commonly known as:  ADVIL,MOTRIN  Take 1 tablet (600 mg total) by mouth every 6 (six) hours as needed.     ibuprofen 800 MG tablet  Commonly known as:  ADVIL,MOTRIN  Take 1 tablet (800 mg total) by mouth 4 (four) times daily.     oxyCODONE-acetaminophen 5-325 MG per tablet  Commonly known as:  PERCOCET/ROXICET  Take 1-2 tablets by mouth every 6 (six) hours as needed.           Follow-up Information   Follow up with Lavonia Drafts, MD In  2 weeks.   Specialty:  Obstetrics and Gynecology   Contact information:   [place] [id number] [phone]       Signed: Lavonia Drafts 04/17/2013, 12:37 PM

## 2013-04-17 NOTE — Telephone Encounter (Signed)
T.C. Pt to check on her post op.  LMTCB.  Daimian Sudberry L. Harraway-Smith, M.D., Cherlynn June

## 2013-04-23 ENCOUNTER — Telehealth: Payer: Self-pay | Admitting: *Deleted

## 2013-04-23 NOTE — Telephone Encounter (Signed)
Patient called nurse line concerning headache, nausea, back pain.   Spoke with patient about nausea for the past 2 days, educated patient that is may be a stomach virus.  Back pain may still be from position during surgery.  Patient verbalizes understanding.

## 2013-04-27 ENCOUNTER — Ambulatory Visit (INDEPENDENT_AMBULATORY_CARE_PROVIDER_SITE_OTHER): Payer: Medicaid Other | Admitting: General Practice

## 2013-04-27 DIAGNOSIS — R350 Frequency of micturition: Secondary | ICD-10-CM

## 2013-04-27 LAB — POCT URINALYSIS DIP (DEVICE)
BILIRUBIN URINE: NEGATIVE
Glucose, UA: NEGATIVE mg/dL
Ketones, ur: NEGATIVE mg/dL
NITRITE: NEGATIVE
PH: 7 (ref 5.0–8.0)
PROTEIN: 30 mg/dL — AB
Specific Gravity, Urine: 1.02 (ref 1.005–1.030)
UROBILINOGEN UA: 1 mg/dL (ref 0.0–1.0)

## 2013-04-27 NOTE — Progress Notes (Signed)
Patient came in to drop off urine sample and left. Called patient and she states she was told by a nurse Tuesday or Wednesday that Dr Ihor Dow wanted her to come by and drop off a urine sample to make sure her previous UTI had cleared. Told patient her urine showed a little something so we would send it off for culture and if something comes back on it we will call and let her know. Patient verbalized understanding and had no further questions

## 2013-04-28 LAB — URINE CULTURE

## 2013-05-02 ENCOUNTER — Encounter: Payer: Self-pay | Admitting: Obstetrics & Gynecology

## 2013-05-02 ENCOUNTER — Ambulatory Visit (INDEPENDENT_AMBULATORY_CARE_PROVIDER_SITE_OTHER): Payer: Medicaid Other | Admitting: Obstetrics & Gynecology

## 2013-05-02 VITALS — BP 131/83 | HR 88 | Temp 98.4°F | Wt 237.3 lb

## 2013-05-02 DIAGNOSIS — Z9889 Other specified postprocedural states: Secondary | ICD-10-CM

## 2013-05-02 DIAGNOSIS — Z09 Encounter for follow-up examination after completed treatment for conditions other than malignant neoplasm: Secondary | ICD-10-CM

## 2013-05-02 NOTE — Progress Notes (Signed)
Subjective:     Patient ID: [name], female   DOB: 1970/11/15, 43 y.o.   MRN: [id number]  HPI Pt presents for her 2 weeks post op check.  She reports that she feels very good.  She is only seldom taking pain meds.        Review of Systems     Objective:   Physical Exam BP 131/83  Pulse 88  Temp(Src) 98.4 F (36.9 C) (Oral)  Wt 237 lb 4.8 oz (107.639 kg)  LMP 03/31/2013  Pt in NAD Abd: soft, NT; ND.  Ports site clean and dry and healing well.   04/16/2013 Diagnosis Uterus and bilateral fallopian tubes, with cervix - UTERUS: -ENDOMETRIUM: ENDOMETRIAL TYPE POLYP. SECRETORY PHASE ENDOMETRIUM. NO HYPERPLASIA OR MALIGNANCY. -MYOMETRIUM: ADENOMYOSIS. 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 -SEROSA: FIBROUS ADHESIONS. - CERVIX: BENIGN ENDOCERVICAL AND SQUAMOUS MUCOSA. NO DYSPLASIA OR MALIGNANCY. - BILATERAL FALLOPIAN TUBES: BENIGN FALLOPIAN TUBES. PARATUBAL CYSTS. PRIOR TUBAL LIGATION. NO MALIGNANCY.     Assessment:     2 weeks post op check     Plan:     Gradual increase of activities NOTHING in vagina for 8 FULL weeks F/u in 4 weeks for internal exam

## 2013-05-02 NOTE — Patient Instructions (Signed)
Total Laparoscopic Hysterectomy, Care After  Refer to this sheet in the next few weeks. These instructions provide you with information on caring for yourself after your procedure. Your health care provider may also give you more specific instructions. Your treatment has been planned according to current medical practices, but problems sometimes occur. Call your health care provider if you have any problems or questions after your procedure.  WHAT TO EXPECT AFTER THE PROCEDURE  · Pain and bruising at the incision sites. You will be given pain medicine to control it.  · Menopausal symptoms such as hot flashes, night sweats, and insomnia if your ovaries were removed.  · Sore throat from the breathing tube that was inserted during surgery.  HOME CARE INSTRUCTIONS  · Only take over-the-counter or prescription medicines for pain, discomfort, or fever as directed by your health care provider.    · Do not take aspirin. It can cause bleeding.    · Do not drive when taking pain medicine.    · Follow your health care provider's advice regarding diet, exercise, lifting, driving, and general activities.    · Resume your usual diet as directed and allowed.    · Get plenty of rest and sleep.    · Do not douche, use tampons, or have sexual intercourse for at least 6 weeks, or until your health care provider gives you permission.    · Change your bandages (dressings) as directed by your health care provider.    · Monitor your temperature and notify your health care provider of a fever.    · Take showers instead of baths for 2 3 weeks.    · Do not drink alcohol until your health care provider gives you permission.    · If you develop constipation, you may take a mild laxative with your health care provider's permission. Bran foods may help with constipation problems. Drinking enough fluids to keep your urine clear or pale yellow may help as well.    · Try to have someone home with you for 1 2 weeks to help around the house.     · Keep all of your follow-up appointments as directed by your health care provider.    SEEK MEDICAL CARE IF:  · You have swelling, redness, or increasing pain around your incision sites.    · You have pus coming from your incision.    · You notice a bad smell coming from your incision.    · Your incision breaks open.    · You feel dizzy or lightheaded.    · You have pain or bleeding when you urinate.    · You have persistent diarrhea.    · You have persistent nausea and vomiting.    · You have abnormal vaginal discharge.    · You have a rash.    · You have any type of abnormal reaction or develop an allergy to your medicine.    · You have poor pain control with your prescribed medicine.    SEEK IMMEDIATE MEDICAL CARE IF:  · You have chest pain or shortness of breath.  · You have severe abdominal pain that is not relieved with pain medicine.  · You have pain or swelling in your legs.  Document Released: 10/25/2012 Document Reviewed: 07/25/2012  ExitCare® Patient Information ©2014 [employer].

## 2013-05-30 ENCOUNTER — Ambulatory Visit (INDEPENDENT_AMBULATORY_CARE_PROVIDER_SITE_OTHER): Payer: Medicaid Other | Admitting: Obstetrics & Gynecology

## 2013-05-30 ENCOUNTER — Encounter: Payer: Self-pay | Admitting: Obstetrics & Gynecology

## 2013-05-30 VITALS — BP 131/81 | HR 72 | Temp 98.2°F | Resp 20 | Ht 65.0 in | Wt 240.0 lb

## 2013-05-30 DIAGNOSIS — Z9889 Other specified postprocedural states: Secondary | ICD-10-CM

## 2013-05-30 DIAGNOSIS — Z09 Encounter for follow-up examination after completed treatment for conditions other than malignant neoplasm: Secondary | ICD-10-CM

## 2013-05-30 NOTE — Patient Instructions (Signed)
Total Laparoscopic Hysterectomy, Care After  Refer to this sheet in the next few weeks. These instructions provide you with information on caring for yourself after your procedure. Your health care provider may also give you more specific instructions. Your treatment has been planned according to current medical practices, but problems sometimes occur. Call your health care provider if you have any problems or questions after your procedure.  WHAT TO EXPECT AFTER THE PROCEDURE  · Pain and bruising at the incision sites. You will be given pain medicine to control it.  · Menopausal symptoms such as hot flashes, night sweats, and insomnia if your ovaries were removed.  · Sore throat from the breathing tube that was inserted during surgery.  HOME CARE INSTRUCTIONS  · Only take over-the-counter or prescription medicines for pain, discomfort, or fever as directed by your health care provider.    · Do not take aspirin. It can cause bleeding.    · Do not drive when taking pain medicine.    · Follow your health care provider's advice regarding diet, exercise, lifting, driving, and general activities.    · Resume your usual diet as directed and allowed.    · Get plenty of rest and sleep.    · Do not douche, use tampons, or have sexual intercourse for at least 6 weeks, or until your health care provider gives you permission.    · Change your bandages (dressings) as directed by your health care provider.    · Monitor your temperature and notify your health care provider of a fever.    · Take showers instead of baths for 2 3 weeks.    · Do not drink alcohol until your health care provider gives you permission.    · If you develop constipation, you may take a mild laxative with your health care provider's permission. Bran foods may help with constipation problems. Drinking enough fluids to keep your urine clear or pale yellow may help as well.    · Try to have someone home with you for 1 2 weeks to help around the house.     · Keep all of your follow-up appointments as directed by your health care provider.    SEEK MEDICAL CARE IF:  · You have swelling, redness, or increasing pain around your incision sites.    · You have pus coming from your incision.    · You notice a bad smell coming from your incision.    · Your incision breaks open.    · You feel dizzy or lightheaded.    · You have pain or bleeding when you urinate.    · You have persistent diarrhea.    · You have persistent nausea and vomiting.    · You have abnormal vaginal discharge.    · You have a rash.    · You have any type of abnormal reaction or develop an allergy to your medicine.    · You have poor pain control with your prescribed medicine.    SEEK IMMEDIATE MEDICAL CARE IF:  · You have chest pain or shortness of breath.  · You have severe abdominal pain that is not relieved with pain medicine.  · You have pain or swelling in your legs.  Document Released: 10/25/2012 Document Reviewed: 07/25/2012  ExitCare® Patient Information ©2014 [employer].

## 2013-05-30 NOTE — Progress Notes (Signed)
Subjective:     Patient ID: [name], female   DOB: 05/01/1970, 43 y.o.   MRN: [id number]  HPI  Pt is s/p ROBOTIC ASSISTED TOTAL HYSTERECTOMY (N/A)  BILATERAL SALPINGECTOMY (Bilateral) Cystoscopy- diagnostic 04/16/2013.  She is here for her 6 weeks check.  She denies problems currently.  She had some spotting last week which was very scant and light and has resolved.  She has not been sexually active.  She is voiding and passing stools without difficulty.       Review of Systems     Objective:   Physical Exam BP 131/81  Pulse 72  Temp(Src) 98.2 F (36.8 C) (Oral)  Resp 20  Ht 5\' 5"  (1.651 m)  Wt 240 lb (108.863 kg)  BMI 39.94 kg/m2  LMP 03/31/2013 Pt in NAD Abd: obese; port sites well healed GU: EGBUS: no lesions Vagina: no blood in vault; cuff well healed Adnexa: no masses; non tender      04/16/2013 Diagnosis Uterus and bilateral fallopian tubes, with cervix - UTERUS: -ENDOMETRIUM: ENDOMETRIAL TYPE POLYP. SECRETORY PHASE ENDOMETRIUM. NO HYPERPLASIA OR MALIGNANCY. -MYOMETRIUM: ADENOMYOSIS. 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 -SEROSA: FIBROUS ADHESIONS. - CERVIX: BENIGN ENDOCERVICAL AND SQUAMOUS MUCOSA. NO DYSPLASIA OR MALIGNANCY. - BILATERAL FALLOPIAN TUBES: BENIGN FALLOPIAN TUBES. PARATUBAL CYSTS. PRIOR TUBAL LIGATION. NO MALIGNANCY     Assessment:     6 weeks post op check doing well     Plan:     F/u in 3 months if needed May begin sexual activity in 2 weeks Gradual return to full activity

## 2013-06-02 ENCOUNTER — Encounter: Payer: Self-pay | Admitting: *Deleted

## 2013-11-19 ENCOUNTER — Encounter: Payer: Self-pay | Admitting: Obstetrics & Gynecology

## 2014-02-15 ENCOUNTER — Ambulatory Visit: Payer: Medicaid Other | Admitting: Obstetrics & Gynecology

## 2014-03-13 ENCOUNTER — Encounter: Payer: Self-pay | Admitting: *Deleted

## 2014-04-15 ENCOUNTER — Encounter: Payer: Self-pay | Admitting: Obstetrics & Gynecology

## 2014-04-15 ENCOUNTER — Ambulatory Visit (INDEPENDENT_AMBULATORY_CARE_PROVIDER_SITE_OTHER): Payer: Medicaid Other | Admitting: Obstetrics & Gynecology

## 2014-04-15 VITALS — BP 127/63 | HR 69 | Ht 66.0 in | Wt 233.1 lb

## 2014-04-15 DIAGNOSIS — Z01419 Encounter for gynecological examination (general) (routine) without abnormal findings: Secondary | ICD-10-CM

## 2014-04-15 NOTE — Progress Notes (Signed)
Patient ID: TAETUM FLEWELLEN, female   DOB: Feb 04, 1970, 44 y.o.   MRN: [id number] Subjective:     AIREANA RYLAND is a 44 y.o. female here for a routine exam.  Current complaints: pain in right side under arm.  Pt is aware that there are movements that she does at work that could cause this. She reports that the pain is improved with NSAIDS.  Her last mammogram was 2011 but, she was not 23yr.  She denies problems after surgery but, thinks that she may have missed a f/u appt.  She denies problems with intercourse or abd/pelvic pain.    Gynecologic History Patient's last menstrual period was 03/31/2013. Contraception: status post hysterectomy 03/2014 Last Pap: 06/2011. Results were: normal Last mammogram: 2011. Results were: normal  Obstetric History OB History  Gravida Para Term Preterm AB SAB TAB Ectopic Multiple Living  3 2 2  1 1    2     # Outcome Date GA Lbr Len/2nd Weight Sex Delivery Anes PTL Lv  3 Term           2 Term           1 SAB                The following portions of the patient's history were reviewed and updated as appropriate: allergies, current medications, past family history, past medical history, past social history, past surgical history and problem list.  Review of Systems A comprehensive review of systems was negative.    Objective:    BP 127/63 mmHg  Pulse 69  Ht 5\' 6"  (1.676 m)  Wt 233 lb 1.6 oz (105.733 kg)  BMI 37.64 kg/m2  LMP 03/31/2013  General Appearance:    Alert, cooperative, no distress, appears stated age                 Neck:   Supple, symmetrical, trachea midline, no adenopathy;    thyroid:  no enlargement/tenderness/nodules; no carotid   bruit or JVD  Back:     Symmetric, no curvature, ROM normal, no CVA tenderness  Lungs:     Clear to auscultation bilaterally, respirations unlabored  Chest Wall:    No tenderness or deformity   Heart:    Regular rate and rhythm, S1 and S2 normal, no murmur, rub   or gallop  Breast Exam:    No  tenderness, masses, or nipple abnormality.  Tenderness under axilla not reproduced.    Abdomen:     Soft, non-tender, bowel sounds active all four quadrants,    no masses, no organomegaly  Genitalia:    Normal female without lesion, discharge or tenderness. Vaginal cuff well healed.  Uterus and cervix surgically absent      Extremities:   Extremities normal, atraumatic, no cyanosis or edema  Pulses:   2+ and symmetric all extremities  Skin:   Skin color, texture, turgor normal, no rashes or lesions  Lymph nodes:   Cervical, supraclavicular, and axillary nodes normal         Assessment:    Healthy female exam.      Plan:    Mammogram ordered.    F/u 1 year or sooner prn

## 2014-04-15 NOTE — Patient Instructions (Signed)
Mammography Mammography is an X-ray of the breasts to look for changes that are not normal. The X-ray image is called a mammogram. This procedure can screen for breast cancer, can detect cancer early, and can diagnose cancer.  LET YOUR CAREGIVER KNOW ABOUT:  Breast implants.  Previous breast disease, biopsy, or surgery.  If you are breastfeeding.  Medicines taken, including vitamins, herbs, eyedrops, over-the-counter medicines, and creams.  Use of steroids (by mouth or creams).  Possibility of pregnancy, if this applies. RISKS AND COMPLICATIONS  Exposure to radiation, but at very low levels.  The results may be misinterpreted.  The results may not be accurate.  Mammography may lead to further tests.  Mammography may not catch certain cancers. BEFORE THE PROCEDURE  Schedule your test about 7 days after your menstrual period. This is when your breasts are the least tender and have signs of hormone changes.  If you have had a mammography done at a different facility in the past, get the mammogram X-rays or have them sent to your current exam facility in order to compare them.  Wash your breasts and under your arms the day of the test.  Do not wear deodorants, perfumes, or powders anywhere on your body.  Wear clothes that you can change in and out of easily. PROCEDURE Relax as much as possible during the test. Any discomfort during the test will be very brief. The test should take less than 30 minutes. The following will happen:  You will undress from the waist up and put on a gown.  You will stand in front of the X-ray machine.  Each breast will be placed between 2 plastic or glass plates. The plates will compress your breast for a few seconds.  X-rays will be taken from different angles of the breast. AFTER THE PROCEDURE  The mammogram will be examined.  Depending on the quality of the images, you may need to repeat certain parts of the test.  Ask when your test  results will be ready. Make sure you get your test results.  You may resume normal activities. Document Released: 01/02/2000 Document Revised: 03/29/2011 Document Reviewed: 10/25/2010 ExitCare Patient Information 2015 [employer]. This information is not intended to replace advice given to you by your health care provider. Make sure you discuss any questions you have with your health care provider.  

## 2014-04-15 NOTE — Progress Notes (Signed)
Mammogram scholarship filled out and faxed to radiology. Patient informed she will be contacted by radiology with mammogram appointment.

## 2014-04-15 NOTE — Progress Notes (Signed)
Patient wants a yearly visit, also has some discomfort at her keloid. Pt still having some PMS symptoms. Reports pain and tenderness in her right breast.

## 2014-04-18 ENCOUNTER — Other Ambulatory Visit: Payer: Self-pay | Admitting: Obstetrics & Gynecology

## 2014-04-18 DIAGNOSIS — Z1231 Encounter for screening mammogram for malignant neoplasm of breast: Secondary | ICD-10-CM

## 2014-04-24 ENCOUNTER — Ambulatory Visit (HOSPITAL_COMMUNITY)
Admission: RE | Admit: 2014-04-24 | Discharge: 2014-04-24 | Disposition: A | Discharge status: Home / Self Care | Payer: Medicaid Other | Source: Ambulatory Visit | Attending: Obstetrics & Gynecology | Admitting: Obstetrics & Gynecology

## 2014-04-24 DIAGNOSIS — Z1231 Encounter for screening mammogram for malignant neoplasm of breast: Secondary | ICD-10-CM

## 2014-04-29 ENCOUNTER — Telehealth: Payer: Self-pay | Admitting: Obstetrics and Gynecology

## 2014-04-29 ENCOUNTER — Other Ambulatory Visit: Payer: Medicaid Other

## 2014-04-29 NOTE — Telephone Encounter (Signed)
Left message for patient to call clinics. Patient missed her lab appointment today.

## 2014-07-05 ENCOUNTER — Encounter (HOSPITAL_COMMUNITY): Payer: Self-pay | Admitting: Emergency Medicine

## 2014-07-05 ENCOUNTER — Emergency Department (INDEPENDENT_AMBULATORY_CARE_PROVIDER_SITE_OTHER)
Admission: EM | Admit: 2014-07-05 | Discharge: 2014-07-05 | Disposition: A | Discharge status: Home / Self Care | Payer: Self-pay | Source: Home / Self Care | Attending: Family Medicine | Admitting: Family Medicine

## 2014-07-05 DIAGNOSIS — B9789 Other viral agents as the cause of diseases classified elsewhere: Principal | ICD-10-CM

## 2014-07-05 DIAGNOSIS — J069 Acute upper respiratory infection, unspecified: Secondary | ICD-10-CM

## 2014-07-05 MED ORDER — HYDROCODONE-HOMATROPINE 5-1.5 MG/5ML PO SYRP: 5.0000 mL | ORAL_SOLUTION | Freq: Four times a day (QID) | ORAL | Status: DC | PRN

## 2014-07-05 MED ORDER — GUAIFENESIN ER 1200 MG PO TB12: 1.0000 | ORAL_TABLET | Freq: Two times a day (BID) | ORAL | Status: AC

## 2014-07-05 MED ORDER — IPRATROPIUM BROMIDE 0.06 % NA SOLN: 2.0000 | Freq: Three times a day (TID) | NASAL | Status: DC

## 2014-07-05 NOTE — ED Provider Notes (Signed)
CSN: [id number]     Arrival date & time 07/05/14  1454 History   First MD Initiated Contact with Patient 07/05/14 1723     Chief Complaint  Patient presents with  . URI   (Consider location/radiation/quality/duration/timing/severity/associated sxs/prior Treatment) HPI   Pt presents to clinic with 4 day h/o cold symptoms that have been getting worse since the onset.  She feels terrible.  She started with a sore throat that has gotten slightly better. She has a lot of sinus congestion with mostly clear rhinorrhea just yesterday she started getting some light yellow in her rhinorrhea.  Her cough is mostly dry and when it is productive it is the same color from her PND.  She is not sleeping well due to her congestion.  She has some mild body aches.  Did not get flu vaccine this year. Home remedies - mucinex, cold preps, vicks - intermittently  Sick contacts at work.  Past Medical History  Diagnosis Date  . Fibroids, submucosal   . Anemia   . History of blood transfusion 02/2011    [facility] - ? 2-3 units transfused   Past Surgical History  Procedure Laterality Date  . Appendectomy    . Cesarean section  1994, 2002  . Tubal ligation  2002  . Wisdom tooth extraction    . Novasure ablation N/A 03/31/2012    Procedure: NOVASURE ABLATION;  Surgeon: Emily Filbert, MD;  Location: [facility];  Service: Gynecology;  Laterality: N/A;  . Laparoscopy N/A 03/31/2012    Procedure: LAPAROSCOPY OPERATIVE;  Surgeon: Emily Filbert, MD;  Location: [facility];  Service: Gynecology;  Laterality: N/A;  . Laparoscopic lysis of adhesions N/A 03/31/2012    Procedure: LAPAROSCOPIC LYSIS OF ADHESIONS;  Surgeon: Emily Filbert, MD;  Location: [facility];  Service: Gynecology;  Laterality: N/A;  . Dilation and curettage of uterus N/A 03/31/2012    Procedure: DILATATION AND CURETTAGE;  Surgeon: Emily Filbert, MD;  Location: [facility];  Service: Gynecology;  Laterality: N/A;  . Robotic assisted total hysterectomy N/A 04/16/2013    Procedure:  ROBOTIC ASSISTED TOTAL HYSTERECTOMY;  Surgeon: Lavonia Drafts, MD;  Location: [facility];  Service: Gynecology;  Laterality: N/A;  . Bilateral salpingectomy Bilateral 04/16/2013    Procedure: BILATERAL SALPINGECTOMY;  Surgeon: Lavonia Drafts, MD;  Location: [facility];  Service: Gynecology;  Laterality: Bilateral;   Family History  Problem Relation Age of Onset  . Fibroids Sister    History  Substance Use Topics  . Smoking status: Former Smoker -- 0.15 packs/day    Types: Cigarettes    Quit date: 01/21/2011  . Smokeless tobacco: Never Used  . Alcohol Use: Yes     Comment: social occasions   OB History    Gravida Para Term Preterm AB TAB SAB Ectopic Multiple Living   3 2 2  1  1   2      Review of Systems  Constitutional: Positive for fever (last night 62F), chills, appetite change (not hungry) and fatigue.  HENT: Positive for congestion, postnasal drip, rhinorrhea (clear and now yellow) and voice change (hoarse). Negative for sore throat.   Respiratory: Positive for cough (mostly dry) and wheezing. Negative for shortness of breath.        No h/o asthma, nonsmoker  Musculoskeletal: Positive for myalgias.  Neurological: Positive for light-headedness (mild).  Psychiatric/Behavioral: Positive for sleep disturbance (2nd to cough).    Allergies  Bee venom and Bactrim  Home Medications   Prior to Admission medications  Medication Sig Start Date End Date Taking? Authorizing Provider  docusate sodium 100 MG CAPS Take 100 mg by mouth 2 (two) times daily. Patient not taking: Reported on 04/15/2014 04/16/13   Lavonia Drafts, MD  ferrous gluconate (FERGON) 324 MG tablet Take 324 mg by mouth daily with breakfast.    Historical Provider, MD  ibuprofen (ADVIL,MOTRIN) 600 MG tablet Take 1 tablet (600 mg total) by mouth every 6 (six) hours as needed. Patient not taking: Reported on 04/15/2014 04/16/13   Lavonia Drafts, MD  ibuprofen (ADVIL,MOTRIN) 800 MG tablet Take 1  tablet (800 mg total) by mouth 4 (four) times daily. Patient not taking: Reported on 04/15/2014 04/16/13   Lavonia Drafts, MD  oxyCODONE-acetaminophen (PERCOCET/ROXICET) 5-325 MG per tablet Take 1-2 tablets by mouth every 6 (six) hours as needed. Patient not taking: Reported on 04/15/2014 04/16/13   Lavonia Drafts, MD   LMP 03/31/2013 Physical Exam  Constitutional: She is oriented to person, place, and time. She appears well-developed and well-nourished.  Appears to not feel well   HENT:  Head: Normocephalic and atraumatic.  Right Ear: Hearing, tympanic membrane, external ear and ear canal normal.  Left Ear: Hearing, tympanic membrane, external ear and ear canal normal.  Nose: Mucosal edema (red and very swollen) present.  Mouth/Throat: Uvula is midline, oropharynx is clear and moist and mucous membranes are normal.  Eyes: Conjunctivae are normal.  Neck: Normal range of motion.  Cardiovascular: Normal rate, regular rhythm, normal heart sounds and intact distal pulses.   No murmur heard. Pulmonary/Chest: Effort normal and breath sounds normal. She has no wheezes.  Neurological: She is alert and oriented to person, place, and time.  Skin: Skin is warm and dry.  Psychiatric: She has a normal mood and affect. Her behavior is normal. Judgment and thought content normal.    ED Course  Procedures (including critical care time) Labs Review Labs Reviewed - No data to display  Imaging Review No results found.   MDM  Viral URI with cough - Plan: Guaifenesin (MUCINEX MAXIMUM STRENGTH) 1200 MG TB12, ipratropium (ATROVENT) 0.06 % nasal spray, HYDROcodone-homatropine (HYCODAN) 5-1.5 MG/5ML syrup   Symptomatic treatment at this time.  Windell Hummingbird PA-C   07/05/2014 6:20 PM     Mancel Bale, PA-C 07/05/14 [phone]

## 2014-07-05 NOTE — Discharge Instructions (Signed)
Please push fluids.  Tylenol and Motrin for fever and body aches.    It is ok to Motrin 800mg  (that is 4 200mg  OTC motrin) 3x/day for the body aches.

## 2014-07-05 NOTE — ED Notes (Signed)
C/o cold sx States she has a headache, ears painful, sore throat, cough which causing chest pain and weak feeling mucinex and sinus med used as tx

## 2015-02-18 ENCOUNTER — Encounter (HOSPITAL_COMMUNITY): Payer: Self-pay | Admitting: Emergency Medicine

## 2015-02-18 ENCOUNTER — Emergency Department (HOSPITAL_COMMUNITY)
Admission: EM | Admit: 2015-02-18 | Discharge: 2015-02-18 | Disposition: A | Discharge status: Home / Self Care | Payer: Self-pay | Attending: Emergency Medicine | Admitting: Emergency Medicine

## 2015-02-18 DIAGNOSIS — D649 Anemia, unspecified: Secondary | ICD-10-CM | POA: Insufficient documentation

## 2015-02-18 DIAGNOSIS — Z79899 Other long term (current) drug therapy: Secondary | ICD-10-CM | POA: Insufficient documentation

## 2015-02-18 DIAGNOSIS — M545 Low back pain, unspecified: Secondary | ICD-10-CM

## 2015-02-18 DIAGNOSIS — Z87891 Personal history of nicotine dependence: Secondary | ICD-10-CM | POA: Insufficient documentation

## 2015-02-18 DIAGNOSIS — Z86018 Personal history of other benign neoplasm: Secondary | ICD-10-CM | POA: Insufficient documentation

## 2015-02-18 MED ORDER — CYCLOBENZAPRINE HCL 10 MG PO TABS
10.0000 mg | ORAL_TABLET | Freq: Once | ORAL | Status: AC
  Administered 2015-02-18: 10 mg via ORAL
  Filled 2015-02-18: qty 1

## 2015-02-18 MED ORDER — CYCLOBENZAPRINE HCL 10 MG PO TABS: 10.0000 mg | ORAL_TABLET | Freq: Two times a day (BID) | ORAL | Status: DC | PRN

## 2015-02-18 MED ORDER — HYDROCODONE-ACETAMINOPHEN 5-325 MG PO TABS: 1.0000 | ORAL_TABLET | ORAL | Status: DC | PRN

## 2015-02-18 MED ORDER — KETOROLAC TROMETHAMINE 60 MG/2ML IM SOLN
60.0000 mg | Freq: Once | INTRAMUSCULAR | Status: AC
  Administered 2015-02-18: 60 mg via INTRAMUSCULAR
  Filled 2015-02-18: qty 2

## 2015-02-18 NOTE — ED Provider Notes (Signed)
CSN: [id number]     Arrival date & time 02/18/15  0807 History   First MD Initiated Contact with Patient 02/18/15 [phone]     Chief Complaint  Patient presents with  . Back Pain     (Consider location/radiation/quality/duration/timing/severity/associated sxs/prior Treatment) HPI   Patient is a 45 year old female who presents to the ED with complaint of lower back pain, onset yesterday around 8:30 AM. Patient reports having waxing and waning "aching/pressure" pain to her lower back and upper buttocks. She notes the pain is worse with movement or ambulating and reports getting intermittent muscle spasms in her lower back bilaterally. Patient denies any recent fall, trauma, injury, or heavy lifting. 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 Patient reports she has been taking ibuprofen at home with mild relief. Pt denies fever, abdominal pain, N/V/D, numbness, tingling, saddle anesthesia, loss of bowel or bladder or retention, weakness, IVDU, hx of cancer or recent spinal manipulation. Patient notes she has had similar pain in the past but reports the last time she had lower back muscle spasms was over 10 years ago.  Past Medical History  Diagnosis Date  . Fibroids, submucosal   . Anemia   . History of blood transfusion 02/2011    [facility] - ? 2-3 units transfused   Past Surgical History  Procedure Laterality Date  . Appendectomy    . Cesarean section  1994, 2002  . Tubal ligation  2002  . Wisdom tooth extraction    . Novasure ablation N/A 03/31/2012    Procedure: NOVASURE ABLATION;  Surgeon: Emily Filbert, MD;  Location: [facility];  Service: Gynecology;  Laterality: N/A;  . Laparoscopy N/A 03/31/2012    Procedure: LAPAROSCOPY OPERATIVE;  Surgeon: Emily Filbert, MD;  Location: [facility];  Service: Gynecology;  Laterality: N/A;  . Laparoscopic lysis of adhesions N/A 03/31/2012    Procedure: LAPAROSCOPIC LYSIS OF ADHESIONS;  Surgeon: Emily Filbert, MD;  Location: [facility];  Service: Gynecology;  Laterality: N/A;  . Dilation and curettage of uterus N/A 03/31/2012    Procedure: DILATATION AND CURETTAGE;  Surgeon: Emily Filbert, MD;  Location: [facility];  Service: Gynecology;  Laterality: N/A;  . Robotic assisted total hysterectomy N/A 04/16/2013    Procedure: ROBOTIC ASSISTED TOTAL HYSTERECTOMY;  Surgeon: Lavonia Drafts, MD;  Location: [facility];  Service: Gynecology;  Laterality: N/A;  . Bilateral salpingectomy Bilateral 04/16/2013    Procedure: BILATERAL SALPINGECTOMY;  Surgeon: Lavonia Drafts, MD;  Location: [facility];  Service: Gynecology;  Laterality: Bilateral;   Family History  Problem Relation Age of Onset  . Fibroids Sister    Social History  Substance Use Topics  . Smoking status: Former Smoker -- 0.15 packs/day    Types: Cigarettes    Quit date: 01/21/2011  . Smokeless tobacco: Never Used  . Alcohol Use: Yes     Comment: social occasions   OB History    Gravida Para Term Preterm AB TAB SAB Ectopic Multiple Living   3 2 2  1  1   2      Review of Systems  Constitutional: Negative for fever.  Gastrointestinal: Negative for nausea, vomiting and abdominal pain.  Genitourinary: Negative for difficulty urinating.  Musculoskeletal: Positive for back pain.  Neurological: Negative for weakness and numbness.      Allergies  Bee venom and Bactrim  Home Medications   Prior to Admission medications   Medication Sig Start Date  End Date Taking? Authorizing Provider  cyclobenzaprine (FLEXERIL) 10 MG tablet Take 1 tablet (10 mg total) by mouth 2 (two) times daily as needed for muscle spasms. 02/18/15   Nona Dell, PA-C  ferrous gluconate (FERGON) 324 MG tablet Take 324 mg by mouth daily with breakfast.    Historical Provider, MD  HYDROcodone-acetaminophen (NORCO/VICODIN) 5-325 MG tablet Take 1-2 tablets by mouth every 4 (four) hours as needed. 02/18/15   Nona Dell, PA-C  HYDROcodone-homatropine [facility]) 5-1.5 MG/5ML  syrup Take 5 mLs by mouth every 6 (six) hours as needed for cough. 07/05/14   Mancel Bale, PA-C  ipratropium (ATROVENT) 0.06 % nasal spray Place 2 sprays into both nostrils 3 (three) times daily. 07/05/14   Mancel Bale, PA-C   BP 131/86 mmHg  Pulse 68  Temp(Src) 98.2 F (36.8 C) (Oral)  Resp 18  SpO2 100%  LMP 03/31/2013 Physical Exam  Constitutional: She is oriented to person, place, and time. She appears well-developed and well-nourished.  HENT:  Head: Normocephalic and atraumatic.  Mouth/Throat: Oropharynx is clear and moist. No oropharyngeal exudate.  Eyes: Conjunctivae and EOM are normal. Right eye exhibits no discharge. Left eye exhibits no discharge. No scleral icterus.  Neck: Normal range of motion. Neck supple.  Cardiovascular: Normal rate, regular rhythm, normal heart sounds and intact distal pulses.   Pulmonary/Chest: Effort normal and breath sounds normal. No respiratory distress. She has no wheezes. She has no rales. She exhibits no tenderness.  Abdominal: Soft. Bowel sounds are normal. She exhibits no distension. There is no tenderness.  Musculoskeletal: Normal range of motion. She exhibits no edema.  No midline C/T/L tenderness. Mild tenderness to bilateral lumbar paraspinal muscles, no spasm palpated. Decreased range of motion of back due to reported pain. Full range of motion and 5/5 strength of bilateral lower extremities. Sensation intact. 2+ PT pulses. Patient able to stand and ambulate without assistance.  Neurological: She is alert and oriented to person, place, and time. She has normal strength and normal reflexes. No sensory deficit. Gait normal.  Skin: Skin is warm and dry.  Nursing note and vitals reviewed.   ED Course  Procedures (including critical care time) Labs Review Labs Reviewed - No data to display  Imaging Review No results found. I have personally reviewed and evaluated these images and lab results as part of my medical  decision-making.  Filed Vitals:   02/18/15 0815  BP: 131/86  Pulse: 68  Temp: 98.2 F (36.8 C)  Resp: 18     MDM   Final diagnoses:  Bilateral low back pain without sciatica    Patient presents with bilateral lower back pain with intermittent muscle spasm. Denies any recent fall, trauma, injury but notes she stands on her feet for up to 12 hours during her shift at work. Reports history of similar pain in the past. No back pain red flags. VSS. Exam revealed mild tenderness to bilateral lumbar paraspinal muscles, mild decreased range of motion of back due to reported pain, bilateral lower extremities neurovascularly intact, patient able to stand and ambulate without assistance. I suspect patient's symptoms are likely due to to muscle strain/spasm. I do not suspect epidural abscess, spinal cord compression/cauda equina syndrome at this time and do not feel any further workup or imaging is warranted. Plan to discharge patient home with pain meds, muscle relaxant and discussed symptomatic treatment. Patient given resource guide to follow up with PCP.  Evaluation does not show pathology requring ongoing emergent intervention or  admission. Pt is hemodynamically stable and mentating appropriately. Discussed findings/results and plan with patient/guardian, who agrees with plan. All questions answered. Return precautions discussed and outpatient follow up given.      Chesley Noon [place], [place] 02/18/15 [id number]  Quintella Reichert, MD 02/19/15 [phone]

## 2015-02-18 NOTE — ED Notes (Signed)
Patient states she is having lower back pain.  Denies injury, but states she has had back problems off and on in the past.

## 2015-02-18 NOTE — Discharge Instructions (Signed)
Take your medications as prescribed. I also recommend continuing to take 800 mg of ibuprofen 3 times daily for pain relief. I recommend continuing to apply heat to affected region for 15-20 minutes 3-4 times daily. Please follow up with a primary care provider from the Resource Guide provided below in 4-5 days. Please return to the Emergency Department if symptoms worsen or new onset of fever, numbness, tingling, groin anesthesia, loss of bowel or bladder or retention, weakness.      Emergency Department Resource Guide 1) Find a Doctor and Pay Out of Pocket Although you won't have to find out who is covered by your insurance plan, it is a good idea to ask around and get recommendations. You will then need to call the office and see if the doctor you have chosen will accept you as a new patient and what types of options they offer for patients who are self-pay. Some doctors offer discounts or will set up payment plans for their patients who do not have insurance, but you will need to ask so you aren't surprised when you get to your appointment.  2) Contact Your Local Health Department Not all health departments have doctors that can see patients for sick visits, but many do, so it is worth a call to see if yours does. If you don't know where your local health department is, you can check in your phone book. The CDC also has a tool to help you locate your state's health department, and many state websites also have listings of all of their local health departments.  3) Find a [facility] If your illness is not likely to be very severe or complicated, you may want to try a walk in clinic. These are popping up all over the country in pharmacies, drugstores, and shopping centers. They're usually staffed by nurse practitioners or physician assistants that have been trained to treat common illnesses and complaints. They're usually fairly quick and inexpensive. However, if you have serious medical issues or  chronic medical problems, these are probably not your best option.  No Primary Care Doctor: - Call Health Connect at  [phone] - they can help you locate a primary care doctor that  accepts your insurance, provides certain services, etc. - Physician Referral Service- [phone]  Chronic Pain Problems: Organization         Address  Phone   Notes  [facility]  [phone] Patients need to be referred by their primary care doctor.   Medication Assistance: Organization         Address  Phone   Notes  [facility] [address] [place], [place] [address] [phone] --Must be a resident of [facility] -- Must have NO insurance coverage whatsoever (no Medicaid/ Medicare, etc.) -- The pt. MUST have a primary care doctor that directs their care regularly and follows them in the community   Med[facility]ist  [phone]   [employer]  [phone]    Agencies that provide inexpensive medical care: Organization         Address  Phone   Notes  [place]  [phone]   [facility]    [phone]   [facility] [place], [place] [address] [phone]   [place] [address] [place] [phone]   Planned Parenthood    [phone]   [facility]    [phone]   [address], [place] Phone:  [phone], Fax:  [phone] Hours of Operation:  9 am - 6 pm, M-F.  Also accepts Medicaid/Medicare and self-pay.  [facility] for [address], [address], [place] Phone: [phone], Fax: [phone]. Hours of Operation:  8:30 am - 5:30 pm, M-F.  Also accepts Medicaid and self-pay.  [facility] [place] [address], [place] Phone: [phone]   [place], [place], [place]  [phone], Ext. [phone] Mondays & Thursdays: 7-9 AM.  First 15 patients are seen on a first come, first serve basis.    [place] Providers:  Organization         Address  Phone   Notes  [facility] [address], [place] [phone] Also accepts self-pay patients.  [facility] [id number] [place], [place]  [phone]   [place], [address], [place] [phone]   [facility] [address], [place] [phone]   Lucianne Lei [address], [place]   [phone] Only accepts [place] Access [place] patients after they have their name applied to their card.   Self-Pay (no insurance) in [facility]:  Organization         Address  Phone   Notes  Sickle Cell Patients, [facility] [place] [phone]   [facility] [place] [phone]   [facility] [place]  [place], [place], [place] [phone]   [facility]/Dr. Osei-Bonsu  [address], [place] or [address], [place] [phone] Phone number for both [place] and [place] locations is the same.  [facility] [address], [place] [phone]   [facility] [address] [place] or [address] [phone]   [facility] [address], [place] [phone], phone; [phone], fax Sees patients 1st and 3rd Saturday of every month.  Must not qualify for public or private insurance (i.e. Medicaid, Medicare, [place] Health Choice, Veterans' Benefits)  Household income should be no more than 200% of the poverty level The clinic cannot treat you if you are pregnant or think you are pregnant  Sexually transmitted  diseases are not treated at the clinic.    Dental Care: Organization         Address  Phone  Notes  [facility] Department of [facility] [place] [phone] Accepts children up to age 49 who are enrolled in [place] or [place]; pregnant women with a Medicaid card; and children who have applied for Medicaid or [place] Health Choice, but were declined, whose parents can pay a reduced fee at time of service.  [facility] Department of [facility]  [address], [place] [phone] Accepts children up to age 34 who are enrolled in [place] or [place]; pregnant women with a Medicaid card; and children who have applied for Medicaid or [place] Health Choice, but were declined, whose parents can pay a reduced fee at time of service.  [facility]  [place] [phone] Patients are seen by appointment only. Walk-ins are not accepted. Dent will see patients 80 years of age and older. Monday - Tuesday (8am-5pm) Most Wednesdays (8:30-5pm) $30 per visit, cash only  [facility] Adult [facility] Access PROGRAM  [address], [facility] [phone] Patients are seen by appointment only. Walk-ins are not accepted. [place] will see patients 50 years of age and older. One Wednesday Evening (Monthly: Volunteer Based).  $30 per visit, cash only  [place]  [phone] for adults; Children under age 34, call Graduate Pediatric Dentistry at [phone]. Children aged 71-14, please call [phone] to request a pediatric application.  Dental services are provided in all areas of dental care including fillings, crowns and bridges, complete and partial dentures, implants, gum treatment, root canals, and extractions. Preventive care is also provided. Treatment is provided to both adults and children. Patients are selected via a  lottery and there is often a waiting list.   [facility] [address] [place]  [phone] [email or web address]   [facility] [address] [place], [place] [phone], Ext. [phone] Second and Fourth Thursday of each month, opens at 6:30 AM; Clinic ends at 9 AM.  Patients are seen on a first-come first-served basis, and a limited number are seen during each clinic.   [facility]  [address] [place]ard Danker [place], [place] [phone]   Eligibility Requirements You must have lived in [place], [place], or [place] counties for at least the last three months.   You cannot be eligible for state or federal sponsored [employer], including [employer], [place], or [employer].   You generally cannot be eligible for healthcare insurance through your employer.    How to apply: Eligibility screenings are held every Tuesday and Wednesday afternoon from 1:00 pm until 4:00 pm. You do not need an appointment for the interview!  [facility] [place] [address] [place], [place]   [place]  [place] Department  [place]  [phone]    Behavioral Health Resources in the Community: Intensive Outpatient Programs Organization         Address  Phone  Notes  [place]. [address] [place], [place] [phone]   [facility] Outpatient [address] [place], [place]   ADS: [facility] [address] [place], [place]   [place] [address]  [place], [place] or [phone]   Substance Abuse Resources Organization         Address  Phone  Notes  Alcohol and Drug Services  [phone]   [place]  [phone]   [facility] [place]   Chinita [place]  [phone]   Residential &  Outpatient Substance Abuse Program  [phone]   Psychological Services Organization         Address  Phone  Notes  [facility] [address] [place]  [place]  [phone]   [place] [address], [place] [phone] or [phone]    Mobile Crisis Teams Organization         Address  Phone  Notes  [facility], Mobile Crisis Care Unit  [phone]   Assertive Psychotherapeutic Services  [address] [place], [place]   [facility] [address] [place] [phone]    Self-Help/Support Groups Organization         Address  Phone             Notes  [place]. of [place] - variety of support groups  [place] Call for more information  Narcotics Anonymous (NA), [facility] [address], [employer] [place]  2 meetings at this location   Special educational needs teacher         Address  Phone  Notes  [facility] Residential Treatment [place],    [place]  [phone]   [facility]  [address] [place] [id number], [place], [place]   [place], [place] [phone] Admissions: 8am-3pm M-F  Incentives Substance [place] [address]    [place], [place] [phone]   [facility] [address] [place], [place], [place]   [facility] [address]  [place], [place]   [facility] Programs - Intensive Outpatient [address] Kristeen [place] [address], [place], [place]   [facility] [place].,  [place], [place] [phone] or [phone]   [facility] (RTS) [address], [place], [place] Accepts Medicaid  [facility] [place] [address]  [place] [phone] Substance Abuse/Addiction Treatment   [facility] Organization          Address  Phone  Notes  [facility]  [phone]   Domenic Schwab, PhD [address] [place], [place]   [phone] or [phone]   [place], [place] [phone]   [facility] [address] [place], [place] [phone] Insurance/Medicaid/sponsorship through [facility] and Families [address], [address] [place]                                    [place], [place] [phone] [place] [address], [place] [phone]    Dr. Adele Schilder  [phone]   [facility] Dept. 1) [address] [place] 2) [place] 3)  [place] [address], [place] [phone]  [phone]   [place] [phone] or [phone] (After Hours)

## 2015-08-12 ENCOUNTER — Ambulatory Visit (HOSPITAL_COMMUNITY)
Admission: EM | Admit: 2015-08-12 | Discharge: 2015-08-12 | Disposition: A | Discharge status: Home / Self Care | Payer: Self-pay | Attending: Physician Assistant | Admitting: Physician Assistant

## 2015-08-12 ENCOUNTER — Encounter (HOSPITAL_COMMUNITY): Payer: Self-pay | Admitting: Emergency Medicine

## 2015-08-12 DIAGNOSIS — B9789 Other viral agents as the cause of diseases classified elsewhere: Secondary | ICD-10-CM

## 2015-08-12 DIAGNOSIS — J069 Acute upper respiratory infection, unspecified: Secondary | ICD-10-CM

## 2015-08-12 MED ORDER — ALBUTEROL SULFATE (2.5 MG/3ML) 0.083% IN NEBU
INHALATION_SOLUTION | RESPIRATORY_TRACT | Status: AC
  Filled 2015-08-12: qty 3

## 2015-08-12 MED ORDER — HYDROCODONE-ACETAMINOPHEN 7.5-325 MG/15ML PO SOLN: 10.0000 mL | Freq: Four times a day (QID) | ORAL | 0 refills | Status: DC | PRN

## 2015-08-12 MED ORDER — ALBUTEROL SULFATE (2.5 MG/3ML) 0.083% IN NEBU
2.5000 mg | INHALATION_SOLUTION | Freq: Once | RESPIRATORY_TRACT | Status: AC
  Administered 2015-08-12: 2.5 mg via RESPIRATORY_TRACT

## 2015-08-12 MED ORDER — AZITHROMYCIN 250 MG PO TABS: 250.0000 mg | ORAL_TABLET | Freq: Every day | ORAL | 0 refills | Status: DC

## 2015-08-12 NOTE — ED Triage Notes (Signed)
The patient presented to the [facility] with a complaint of a productive cough x 2 weeks. The patient reported it to be worse at night.

## 2015-09-01 ENCOUNTER — Emergency Department (HOSPITAL_COMMUNITY): Payer: Medicaid Other

## 2015-09-01 ENCOUNTER — Encounter (HOSPITAL_COMMUNITY): Payer: Self-pay | Admitting: *Deleted

## 2015-09-01 DIAGNOSIS — R1011 Right upper quadrant pain: Secondary | ICD-10-CM | POA: Insufficient documentation

## 2015-09-01 DIAGNOSIS — Z87891 Personal history of nicotine dependence: Secondary | ICD-10-CM | POA: Insufficient documentation

## 2015-09-01 DIAGNOSIS — D649 Anemia, unspecified: Secondary | ICD-10-CM | POA: Insufficient documentation

## 2015-09-01 DIAGNOSIS — R072 Precordial pain: Secondary | ICD-10-CM | POA: Diagnosis present

## 2015-09-01 LAB — CBC
HCT: 36.2 % (ref 36.0–46.0)
Hemoglobin: 11.7 g/dL — ABNORMAL LOW (ref 12.0–15.0)
MCH: 25.4 pg — ABNORMAL LOW (ref 26.0–34.0)
MCHC: 32.3 g/dL (ref 30.0–36.0)
MCV: 78.7 fL (ref 78.0–100.0)
PLATELETS: 272 10*3/uL (ref 150–400)
RBC: 4.6 MIL/uL (ref 3.87–5.11)
RDW: 13.5 % (ref 11.5–15.5)
WBC: 6.2 10*3/uL (ref 4.0–10.5)

## 2015-09-01 LAB — I-STAT TROPONIN, ED: TROPONIN I, POC: 0 ng/mL (ref 0.00–0.08)

## 2015-09-01 NOTE — ED Triage Notes (Signed)
Pt c/o intermittent left sided chest pain since 2200 tonight. Pt starts she had a "rush of something" come over her during the onset of pain. Pt denies any other symptoms associated with the CP.

## 2015-09-02 ENCOUNTER — Emergency Department (HOSPITAL_COMMUNITY)
Admission: EM | Admit: 2015-09-02 | Discharge: 2015-09-02 | Disposition: A | Discharge status: Home / Self Care | Payer: Medicaid Other | Attending: Emergency Medicine | Admitting: Emergency Medicine

## 2015-09-02 ENCOUNTER — Emergency Department (HOSPITAL_COMMUNITY): Payer: Medicaid Other

## 2015-09-02 DIAGNOSIS — R1010 Upper abdominal pain, unspecified: Secondary | ICD-10-CM

## 2015-09-02 DIAGNOSIS — D649 Anemia, unspecified: Secondary | ICD-10-CM

## 2015-09-02 LAB — HEPATIC FUNCTION PANEL
ALK PHOS: 63 U/L (ref 38–126)
ALT: 16 U/L (ref 14–54)
AST: 22 U/L (ref 15–41)
Albumin: 3.5 g/dL (ref 3.5–5.0)
BILIRUBIN DIRECT: 0.2 mg/dL (ref 0.1–0.5)
BILIRUBIN INDIRECT: 1 mg/dL — AB (ref 0.3–0.9)
BILIRUBIN TOTAL: 1.2 mg/dL (ref 0.3–1.2)
Total Protein: 6.6 g/dL (ref 6.5–8.1)

## 2015-09-02 LAB — BASIC METABOLIC PANEL
Anion gap: 5 (ref 5–15)
BUN: 12 mg/dL (ref 6–20)
CALCIUM: 8.9 mg/dL (ref 8.9–10.3)
CO2: 25 mmol/L (ref 22–32)
CREATININE: 0.65 mg/dL (ref 0.44–1.00)
Chloride: 108 mmol/L (ref 101–111)
GFR calc Af Amer: >60 mL/min (ref >60)
GFR calc non Af Amer: >60 mL/min (ref >60)
Glucose, Bld: 92 mg/dL (ref 65–99)
Potassium: 4.2 mmol/L (ref 3.5–5.1)
Sodium: 138 mmol/L (ref 135–145)

## 2015-09-02 LAB — LIPASE, BLOOD: Lipase: 23 U/L (ref 11–51)

## 2015-09-02 MED ORDER — PANTOPRAZOLE SODIUM 40 MG PO TBEC
40.0000 mg | DELAYED_RELEASE_TABLET | Freq: Once | ORAL | Status: AC
  Administered 2015-09-02: 40 mg via ORAL
  Filled 2015-09-02: qty 1

## 2015-09-02 MED ORDER — GI COCKTAIL ~~LOC~~
30.0000 mL | Freq: Once | ORAL | Status: AC
  Administered 2015-09-02: 30 mL via ORAL
  Filled 2015-09-02: qty 30

## 2015-09-02 MED ORDER — MORPHINE SULFATE (PF) 4 MG/ML IV SOLN
4.0000 mg | Freq: Once | INTRAVENOUS | Status: AC
  Administered 2015-09-02: 4 mg via INTRAVENOUS
  Filled 2015-09-02: qty 1

## 2015-09-02 MED ORDER — ONDANSETRON HCL 4 MG/2ML IJ SOLN
4.0000 mg | Freq: Once | INTRAMUSCULAR | Status: AC
  Administered 2015-09-02: 4 mg via INTRAVENOUS
  Filled 2015-09-02: qty 2

## 2015-09-02 NOTE — ED Provider Notes (Signed)
[facility] DEPT Provider Note   CSN: [id number] Arrival date & time: 09/01/15  2315  By signing my name below, I, [name], attest that this documentation has been prepared under the direction and in the presence of Kimberly Fuel, MD. Electronically Signed: Judithann [name], ED Scribe. 09/02/15. 4:06 AM.    History   Chief Complaint Chief Complaint  Patient presents with  . Chest Pain    HPI Comments: [name] is a 45 y.o. female who presents to the Emergency Department complaining of intermittent non-radiating 8/10 sharp pain to her left lower sternal area onset several hours ago. She reports associated mild nausea. She notes that the pain is better with sitting and it is gradually moving to her right side. She explains that she had a hamburger 2 hours prior to onset of these symptoms. She reports that she has been having persistent non-productive cough for approx 3 weeks. No alleviating factors noted. Pt has not tried any medications PTA. She has a hx of appendectomy and C-section. She denies a hx of any issues with her gallbladder. She denies any similar symptoms in the past. She also denies any fever, diaphoresis, shortness of breath, or vomiting.   No PCP.   The history is provided by the patient. No language interpreter was used.    Past Medical History:  Diagnosis Date  . Anemia   . Fibroids, submucosal   . History of blood transfusion 02/2011   [facility] - ? 2-3 units transfused    Patient Active Problem List   Diagnosis Date Noted  . Postoperative state 04/16/2013  . Hematometra 01/09/2013  . Menorrhagia 01/09/2013  . Chronic pelvic pain in female 01/09/2013    Past Surgical History:  Procedure Laterality Date  . APPENDECTOMY    . BILATERAL SALPINGECTOMY Bilateral 04/16/2013   Procedure: BILATERAL SALPINGECTOMY;  Surgeon: Lavonia Drafts, MD;  Location: [facility];  Service: Gynecology;  Laterality: Bilateral;  . [place], 2002  .  DILATION AND CURETTAGE OF UTERUS N/A 03/31/2012   Procedure: DILATATION AND CURETTAGE;  Surgeon: Emily Filbert, MD;  Location: [facility];  Service: Gynecology;  Laterality: N/A;  . LAPAROSCOPIC LYSIS OF ADHESIONS N/A 03/31/2012   Procedure: LAPAROSCOPIC LYSIS OF ADHESIONS;  Surgeon: Emily Filbert, MD;  Location: [facility];  Service: Gynecology;  Laterality: N/A;  . LAPAROSCOPY N/A 03/31/2012   Procedure: LAPAROSCOPY OPERATIVE;  Surgeon: Emily Filbert, MD;  Location: [facility];  Service: Gynecology;  Laterality: N/A;  . NOVASURE ABLATION N/A 03/31/2012   Procedure: NOVASURE ABLATION;  Surgeon: Emily Filbert, MD;  Location: [facility];  Service: Gynecology;  Laterality: N/A;  . ROBOTIC ASSISTED TOTAL HYSTERECTOMY N/A 04/16/2013   Procedure: ROBOTIC ASSISTED TOTAL HYSTERECTOMY;  Surgeon: Lavonia Drafts, MD;  Location: [facility];  Service: Gynecology;  Laterality: N/A;  . TUBAL LIGATION  2002  . WISDOM TOOTH EXTRACTION      OB History    Gravida Para Term Preterm AB Living   3 2 2   1 2    SAB TAB Ectopic Multiple Live Births   1               Home Medications    Prior to Admission medications   Medication Sig Start Date End Date Taking? Authorizing Provider  azithromycin (ZITHROMAX) 250 MG tablet Take 1 tablet (250 mg total) by mouth daily. Take first 2 tablets together, then 1 every day until finished. 08/12/15   Konrad Felix, PA  cyclobenzaprine (FLEXERIL)  10 MG tablet Take 1 tablet (10 mg total) by mouth 2 (two) times daily as needed for muscle spasms. 02/18/15   Nona Dell, PA-C  ferrous gluconate (FERGON) 324 MG tablet Take 324 mg by mouth daily with breakfast.    Historical Provider, MD  HYDROcodone-acetaminophen (HYCET) 7.5-325 mg/15 ml solution Take 10 mLs by mouth 4 (four) times daily as needed for moderate pain. 08/12/15   Konrad Felix, PA  HYDROcodone-acetaminophen (NORCO/VICODIN) 5-325 MG tablet Take 1-2 tablets by mouth every 4 (four) hours as needed. 02/18/15   Nona Dell, PA-C  HYDROcodone-homatropine [facility]) 5-1.5 MG/5ML syrup Take 5 mLs by mouth every 6 (six) hours as needed for cough. 07/05/14   Mancel Bale, PA-C  ipratropium (ATROVENT) 0.06 % nasal spray Place 2 sprays into both nostrils 3 (three) times daily. 07/05/14   Mancel Bale, PA-C    Family History Family History  Problem Relation Age of Onset  . Fibroids Sister     Social History Social History  Substance Use Topics  . Smoking status: Former Smoker    Packs/day: 0.15    Types: Cigarettes    Quit date: 01/21/2011  . Smokeless tobacco: Never Used  . Alcohol use Yes     Comment: social occasions     Allergies   Bee venom and Bactrim [sulfamethoxazole-trimethoprim]   Review of Systems Review of Systems  Constitutional: Negative for diaphoresis and fever.  Respiratory: Positive for cough. Negative for shortness of breath.   Gastrointestinal: Positive for abdominal pain and nausea. Negative for vomiting.  All other systems reviewed and are negative.    Physical Exam Updated Vital Signs BP 126/76 (BP Location: Right Arm)   Pulse (!) 54   Temp 97.9 F (36.6 C) (Oral)   Resp 13   Ht 5\' 5"  (1.651 m)   Wt 220 lb (99.8 kg)   LMP 03/31/2013   SpO2 100%   BMI 36.61 kg/m   Physical Exam  Constitutional: She is oriented to person, place, and time. She appears well-developed and well-nourished.  HENT:  Head: Normocephalic and atraumatic.  Eyes: EOM are normal. Pupils are equal, round, and reactive to light.  Neck: Normal range of motion. Neck supple. No JVD present.  Cardiovascular: Normal rate, regular rhythm and normal heart sounds.   No murmur heard. Pulmonary/Chest: Effort normal and breath sounds normal. She has no wheezes. She has no rales. She exhibits no tenderness.  Abdominal: Soft. She exhibits no distension and no mass. Bowel sounds are decreased. There is tenderness. There is positive Murphy's sign. There is no rebound and no guarding.  Tender across the  costal margin Positive Murphy's sign Bowel sounds decreased.   Musculoskeletal: Normal range of motion. She exhibits no edema.  Lymphadenopathy:    She has no cervical adenopathy.  Neurological: She is alert and oriented to person, place, and time. No cranial nerve deficit. She exhibits normal muscle tone. Coordination normal.  Skin: Skin is warm and dry. No rash noted.  Psychiatric: She has a normal mood and affect. Her behavior is normal. Judgment and thought content normal.  Nursing note and vitals reviewed.    ED Treatments / Results  DIAGNOSTIC STUDIES: Oxygen Saturation is 100% on RA, normal by my interpretation.    COORDINATION OF CARE: 4:04 AM- Pt advised of plan for treatment and pt agrees. Pt informed of her lab and EKG results. She will receive additional lab work for further evaluation.    Labs (all labs ordered  are listed, but only abnormal results are displayed) Labs Reviewed  CBC - Abnormal; Notable for the following:       Result Value   Hemoglobin 11.7 (*)    MCH 25.4 (*)    All other components within normal limits  HEPATIC FUNCTION PANEL - Abnormal; Notable for the following:    Indirect Bilirubin 1.0 (*)    All other components within normal limits  BASIC METABOLIC PANEL  LIPASE, BLOOD  I-STAT TROPOININ, ED    EKG  EKG Interpretation  Date/Time:  Monday September 01 2015 23:16:42 EDT Ventricular Rate:  59 PR Interval:  144 QRS Duration: 80 QT Interval:  412 QTC Calculation: 407 R Axis:   59 Text Interpretation:  Sinus bradycardia Otherwise normal ECG When compared with ECG of 02/21/2011, No significant change was found Confirmed by [facility]  MD, Matia Zelada ([id number]) on 09/01/2015 11:30:44 PM       Radiology Dg Chest 2 View  Result Date: 09/01/2015 CLINICAL DATA:  Mid chest pain EXAM: CHEST  2 VIEW COMPARISON:  None. FINDINGS: The heart size and mediastinal contours are within normal limits. Both lungs are clear. The visualized skeletal structures are  unremarkable. IMPRESSION: Negative two view chest x-ray Electronically Signed   By: San Morelle M.D.   On: 09/01/2015 23:56   US Abdomen Limited  Result Date: 09/02/2015 CLINICAL DATA:  Abdominal pain in the left lower sternal area. Onset with eating. EXAM: US ABDOMEN LIMITED - RIGHT UPPER QUADRANT COMPARISON:  None. FINDINGS: Gallbladder: No gallstones or wall thickening visualized. No sonographic Murphy sign noted by sonographer. Common bile duct: Diameter: 3.8 mm, normal Liver: Focal homogeneous hyperechoic lesions centrally in the liver measuring about 1 cm diameter. This likely represents a small hemangioma. No other focal liver lesions identified. IMPRESSION: No evidence of cholelithiasis or cholecystitis. No bile duct dilatation. 1 cm hyperechoic lesion in the liver probably represents a hemangioma. Electronically Signed   By: Lucienne Capers M.D.   On: 09/02/2015 05:07    Procedures Procedures (including critical care time)  Medications Ordered in ED Medications - No data to display   Initial Impression / Assessment and Plan / ED Course  Kimberly Fuel, MD has reviewed the triage vital signs and the nursing notes.  Pertinent labs & imaging results that were available during my care of the patient were reviewed by me and considered in my medical decision making (see chart for details).  Clinical Course    Number of abdominal pain of uncertain cause. She has had multiple abdominal surgeries, but has not had cholecystectomy. She does have tenderness in the right upper quadrant pain did come on after eating hamburger. She is sent for gallbladder ultrasound which shows no evidence of cholelithiasis. She was given a dose of morphine and complete relief of pain. At this point, the pain is felt to be due to GERD, possible peptic ulcer disease. She'll be treated with rotund pump inhibitor. She is advised to buy omeprazole to take once a day. Given Scientist, product/process development.  Final  Clinical Impressions(s) / ED Diagnoses   Final diagnoses:  Upper abdominal pain  Normocytic anemia   I personally performed the services described in this documentation, which was scribed in my presence. The recorded information has been reviewed and is accurate.      New Prescriptions New Prescriptions   No medications on file     Kimberly Fuel, MD [id number]

## 2015-09-02 NOTE — ED Notes (Signed)
Patient transported to Ultrasound 

## 2015-09-02 NOTE — Discharge Instructions (Signed)
Take omeprazole (Prilosec OTC) once a day.

## 2016-02-06 ENCOUNTER — Ambulatory Visit (HOSPITAL_COMMUNITY)
Admission: EM | Admit: 2016-02-06 | Discharge: 2016-02-06 | Disposition: A | Discharge status: Home / Self Care | Payer: Medicaid Other | Attending: Family Medicine | Admitting: Family Medicine

## 2016-02-06 ENCOUNTER — Encounter (HOSPITAL_COMMUNITY): Payer: Self-pay | Admitting: Emergency Medicine

## 2016-02-06 DIAGNOSIS — M940 Chondrocostal junction syndrome [Tietze]: Secondary | ICD-10-CM

## 2016-02-06 MED ORDER — MELOXICAM 15 MG PO TABS: 15.0000 mg | ORAL_TABLET | Freq: Every day | ORAL | 0 refills | Status: DC

## 2016-02-06 MED ORDER — PREDNISONE 10 MG (21) PO TBPK: 20.0000 mg | ORAL_TABLET | Freq: Two times a day (BID) | ORAL | 0 refills | Status: DC

## 2016-02-06 MED ORDER — BENZONATATE 100 MG PO CAPS: 100.0000 mg | ORAL_CAPSULE | Freq: Three times a day (TID) | ORAL | 0 refills | Status: DC

## 2016-02-06 NOTE — Discharge Instructions (Signed)
You most likely have Costochondritis. I have sent a prescription to your pharmacy for Mobic, take 1 tablet daily, and for a steroid called prednisone, take 2 tablets twice a day for 6 days. I have also sent in a prescription for cough medicine called Tessalon, take 1 tablet 3 times daily as needed. Should your symptoms fail to improve or worsen follow up with your primary care provider or return to clinic. If your chest pain worsens, if you become sweaty, if the pain becomes a pressure, if you feel your heart racing, call 911 or go to the emergency room.

## 2016-02-06 NOTE — ED Triage Notes (Addendum)
Started Wednesday, sharp pain that comes and goes. No fever slight chills numbness on the right side. Taking Ibuprofen OTC this morning.... Herold Harms, Student CMA

## 2016-02-06 NOTE — ED Provider Notes (Signed)
CSN: [id number]     Arrival date & time 02/06/16  1804 History   None    Chief Complaint  Patient presents with  . Chest Pain   (Consider location/radiation/quality/duration/timing/severity/associated sxs/prior Treatment) 46 year old female presents to clinic with chief complaint of right sided chest pain, onset for three days, worse with movement and deep inspiration, denies any palpations, denies any swelling in hands feet or ankles, denies any shortness of breath or exertional dyspnea. She recently had URI symptoms and has had a cough for several weeks. She has no nausea, vomiting, or diarrhea, no fever, no other symptoms.   The history is provided by the patient.    Past Medical History:  Diagnosis Date  . Anemia   . Fibroids, submucosal   . History of blood transfusion 02/2011   [facility] - ? 2-3 units transfused   Past Surgical History:  Procedure Laterality Date  . APPENDECTOMY    . BILATERAL SALPINGECTOMY Bilateral 04/16/2013   Procedure: BILATERAL SALPINGECTOMY;  Surgeon: Lavonia Drafts, MD;  Location: [facility];  Service: Gynecology;  Laterality: Bilateral;  . [place], 2002  . DILATION AND CURETTAGE OF UTERUS N/A 03/31/2012   Procedure: DILATATION AND CURETTAGE;  Surgeon: Emily Filbert, MD;  Location: [facility];  Service: Gynecology;  Laterality: N/A;  . LAPAROSCOPIC LYSIS OF ADHESIONS N/A 03/31/2012   Procedure: LAPAROSCOPIC LYSIS OF ADHESIONS;  Surgeon: Emily Filbert, MD;  Location: [facility];  Service: Gynecology;  Laterality: N/A;  . LAPAROSCOPY N/A 03/31/2012   Procedure: LAPAROSCOPY OPERATIVE;  Surgeon: Emily Filbert, MD;  Location: [facility];  Service: Gynecology;  Laterality: N/A;  . NOVASURE ABLATION N/A 03/31/2012   Procedure: NOVASURE ABLATION;  Surgeon: Emily Filbert, MD;  Location: [facility];  Service: Gynecology;  Laterality: N/A;  . ROBOTIC ASSISTED TOTAL HYSTERECTOMY N/A 04/16/2013   Procedure: ROBOTIC ASSISTED TOTAL HYSTERECTOMY;  Surgeon: Lavonia Drafts,  MD;  Location: [facility];  Service: Gynecology;  Laterality: N/A;  . TUBAL LIGATION  2002  . WISDOM TOOTH EXTRACTION     Family History  Problem Relation Age of Onset  . Fibroids Sister    Social History  Substance Use Topics  . Smoking status: Never Smoker  . Smokeless tobacco: Never Used  . Alcohol use No     Comment: social occasions   OB History    Gravida Para Term Preterm AB Living   3 2 2   1 2    SAB TAB Ectopic Multiple Live Births   1             Review of Systems  Reason unable to perform ROS: as covered in HPI.  All other systems reviewed and are negative.   Allergies  Bee venom and Bactrim [sulfamethoxazole-trimethoprim]  Home Medications   Prior to Admission medications   Medication Sig Start Date End Date Taking? Authorizing Provider  benzonatate (TESSALON) 100 MG capsule Take 1 capsule (100 mg total) by mouth every 8 (eight) hours. 02/06/16   Barnet Glasgow, NP  meloxicam (MOBIC) 15 MG tablet Take 1 tablet (15 mg total) by mouth daily. 02/06/16   Barnet Glasgow, NP  predniSONE (STERAPRED UNI-PAK 21 TAB) 10 MG (21) TBPK tablet Take 2 tablets (20 mg total) by mouth 2 (two) times daily. 02/06/16   Barnet Glasgow, NP   Meds Ordered and Administered this Visit  Medications - No data to display  BP 128/80 (BP Location: Right Arm)   Pulse 67   Temp 97.8 F (  36.6 C) (Oral)   Resp 19   LMP 03/31/2013   SpO2 100%  No data found.   Physical Exam  Constitutional: She is oriented to person, place, and time. She appears well-developed and well-nourished. No distress.  HENT:  Head: Normocephalic.  Right Ear: External ear normal.  Left Ear: External ear normal.  Nose: Nose normal.  Mouth/Throat: Oropharynx is clear and moist.  Neck: Normal range of motion. Neck supple. No JVD present.  Cardiovascular: Normal rate, regular rhythm, normal heart sounds and intact distal pulses.   Pulmonary/Chest: Effort normal and breath sounds normal. She exhibits tenderness.     Abdominal: Soft. Bowel sounds are normal.  Lymphadenopathy:    She has no cervical adenopathy.  Neurological: She is alert and oriented to person, place, and time.  Skin: Skin is warm and dry. Capillary refill takes less than 2 seconds. She is not diaphoretic.  Nursing note and vitals reviewed.   Urgent Care Course     Procedures (including critical care time)  Labs Review Labs Reviewed - No data to display  Imaging Review No results found.   Visual Acuity Review  Right Eye Distance:   Left Eye Distance:   Bilateral Distance:    Right Eye Near:   Left Eye Near:    Bilateral Near:         MDM   1. Costochondritis, acute    You most likely have Costochondritis. I have sent a prescription to your pharmacy for Mobic, take 1 tablet daily, and for a steroid called prednisone, take 2 tablets twice a day for 6 days. I have also sent in a prescription for cough medicine called Tessalon, take 1 tablet 3 times daily as needed. Should your symptoms fail to improve or worsen follow up with your primary care provider or return to clinic. If your chest pain worsens, if you become sweaty, if the pain becomes a pressure, if you feel your heart racing, call 911 or go to the emergency room.     Barnet Glasgow, NP 02/06/16 2115

## 2016-08-25 ENCOUNTER — Other Ambulatory Visit: Payer: Self-pay | Admitting: Obstetrics and Gynecology

## 2016-08-25 DIAGNOSIS — N644 Mastodynia: Secondary | ICD-10-CM

## 2016-09-07 ENCOUNTER — Other Ambulatory Visit: Payer: Medicaid Other

## 2016-09-07 ENCOUNTER — Encounter (HOSPITAL_COMMUNITY): Payer: Self-pay

## 2016-09-07 ENCOUNTER — Ambulatory Visit: Admission: RE | Admit: 2016-09-07 | Payer: Medicaid Other | Source: Ambulatory Visit

## 2016-09-07 ENCOUNTER — Ambulatory Visit (HOSPITAL_COMMUNITY)
Admission: RE | Admit: 2016-09-07 | Discharge: 2016-09-07 | Disposition: A | Discharge status: Home / Self Care | Payer: Self-pay | Source: Ambulatory Visit | Attending: Obstetrics and Gynecology | Admitting: Obstetrics and Gynecology

## 2016-09-07 ENCOUNTER — Ambulatory Visit (HOSPITAL_COMMUNITY): Payer: Medicaid Other

## 2016-09-07 ENCOUNTER — Ambulatory Visit
Admission: RE | Admit: 2016-09-07 | Discharge: 2016-09-07 | Disposition: A | Discharge status: Home / Self Care | Payer: No Typology Code available for payment source | Source: Ambulatory Visit | Attending: Obstetrics and Gynecology | Admitting: Obstetrics and Gynecology

## 2016-09-07 VITALS — BP 142/82 | HR 70 | Temp 98.5°F | Ht 65.0 in | Wt 240.2 lb

## 2016-09-07 DIAGNOSIS — Z1239 Encounter for other screening for malignant neoplasm of breast: Secondary | ICD-10-CM

## 2016-09-07 DIAGNOSIS — N644 Mastodynia: Secondary | ICD-10-CM

## 2016-09-07 NOTE — Progress Notes (Signed)
Complaints of right upper breast pain since March 2018. Patient states the pain comes and goes. Patient rates the pain at a 4 out of 10.  Pap Smear: Pap smear not completed today. Last Pap smear was 07/14/2011 and normal with negative HPV. Per patient has a history of an abnormal Pap smear when she was in her early 21's that a repeat Pap smear was completed for follow up that was normal. Per patient she has had at least three normal Pap smears since abnormal. Patient has a history of a hysterectomy 04/16/2013 due to AUB. Patient no longer needs Pap smears due to her history of a hysterectomy for benign reasons per BCCCP and ACOG guidelines. Last Pap smear and hysterectomy reports are in EPIC.  Physical exam: Breasts Breasts symmetrical. No skin abnormalities bilateral breasts. No nipple retraction bilateral breasts. No nipple discharge bilateral breasts. No lymphadenopathy. No lumps palpated bilateral breasts. Complaints of right upper breast tenderness on exam. Referred patient to the [facility] for diagnostic mammogram and possible left breast ultrasound. Appointment scheduled for Tuesday, September 07, 2016 at 1120.        Pelvic/Bimanual No Pap smear completed today since patient has a history of a hysterectomy for benign reasons. Pap smear not indicated per BCCCP guidelines.   Smoking History: Patient is a former smoker that quit in 2013.  Patient Navigation: Patient education provided. Access to services provided for patient through [facility] program.

## 2016-09-07 NOTE — Patient Instructions (Signed)
Explained breast self awareness with [name]. Patient did not need a Pap smear today due to her history of a hysterectomy for benign reasons. Let her know she doesn't need any further Pap smears due to her history of a hysterectomy for benign reasons. Referred patient to the [facility] for diagnostic mammogram and possible left breast ultrasound. Appointment scheduled for Tuesday, September 07, 2016 at 1120. Enslee R Snedeker verbalized understanding.  Kenry Daubert, Arvil Chaco, RN 9:45 AM

## 2017-10-10 ENCOUNTER — Emergency Department (HOSPITAL_COMMUNITY)
Admission: EM | Admit: 2017-10-10 | Discharge: 2017-10-11 | Disposition: A | Discharge status: Home / Self Care | Payer: BLUE CROSS/BLUE SHIELD | Attending: Emergency Medicine | Admitting: Emergency Medicine

## 2017-10-10 ENCOUNTER — Ambulatory Visit (HOSPITAL_COMMUNITY)
Admission: EM | Admit: 2017-10-10 | Discharge: 2017-10-10 | Disposition: A | Discharge status: Home / Self Care | Payer: MEDICAID

## 2017-10-10 ENCOUNTER — Emergency Department (HOSPITAL_COMMUNITY): Payer: BLUE CROSS/BLUE SHIELD

## 2017-10-10 ENCOUNTER — Encounter (HOSPITAL_COMMUNITY): Payer: Self-pay | Admitting: Emergency Medicine

## 2017-10-10 DIAGNOSIS — R079 Chest pain, unspecified: Secondary | ICD-10-CM | POA: Insufficient documentation

## 2017-10-10 DIAGNOSIS — Z5321 Procedure and treatment not carried out due to patient leaving prior to being seen by health care provider: Secondary | ICD-10-CM | POA: Diagnosis not present

## 2017-10-10 DIAGNOSIS — R0789 Other chest pain: Secondary | ICD-10-CM | POA: Diagnosis not present

## 2017-10-10 LAB — BASIC METABOLIC PANEL
ANION GAP: 8 (ref 5–15)
BUN: 15 mg/dL (ref 6–20)
CHLORIDE: 107 mmol/L (ref 98–111)
CO2: 23 mmol/L (ref 22–32)
Calcium: 9.1 mg/dL (ref 8.9–10.3)
Creatinine, Ser: 0.66 mg/dL (ref 0.44–1.00)
GFR calc Af Amer: >60 mL/min (ref >60)
GLUCOSE: 97 mg/dL (ref 70–99)
POTASSIUM: 3.8 mmol/L (ref 3.5–5.1)
Sodium: 138 mmol/L (ref 135–145)

## 2017-10-10 LAB — CBC
HEMATOCRIT: 37.4 % (ref 36.0–46.0)
HEMOGLOBIN: 11.8 g/dL — AB (ref 12.0–15.0)
MCH: 26.2 pg (ref 26.0–34.0)
MCHC: 31.6 g/dL (ref 30.0–36.0)
MCV: 83.1 fL (ref 78.0–100.0)
Platelets: 276 10*3/uL (ref 150–400)
RBC: 4.5 MIL/uL (ref 3.87–5.11)
RDW: 13.7 % (ref 11.5–15.5)
WBC: 7.1 10*3/uL (ref 4.0–10.5)

## 2017-10-10 LAB — I-STAT TROPONIN, ED: Troponin i, poc: 0 ng/mL (ref 0.00–0.08)

## 2017-10-10 NOTE — ED Triage Notes (Signed)
Pt arrives from urgent care for c.o. Left sided chest pain, that is intermittent.

## 2017-10-10 NOTE — ED Triage Notes (Signed)
Pt c/o off and on L sided chest pain, states its happened 5 times today since noon, denies pain at this time. EKG obtained, given to Dr. Joseph Art, per MD pt needs eval in ER. Pt agreeable to plan. Left for ER with daughter.

## 2017-10-10 NOTE — ED Notes (Signed)
Pt asked when she would be seen and politely I informed her that there was approximately 7 in front of her and her response "Are you serious, this is ridiculous, I been here 5 hours and you're telling me there's 7 more in front of me"

## 2017-10-11 ENCOUNTER — Encounter (HOSPITAL_COMMUNITY): Payer: Self-pay | Admitting: Emergency Medicine

## 2017-10-11 ENCOUNTER — Emergency Department (HOSPITAL_COMMUNITY)
Admission: EM | Admit: 2017-10-11 | Discharge: 2017-10-11 | Disposition: A | Discharge status: Home / Self Care | Payer: BLUE CROSS/BLUE SHIELD | Attending: Emergency Medicine | Admitting: Emergency Medicine

## 2017-10-11 DIAGNOSIS — R079 Chest pain, unspecified: Secondary | ICD-10-CM | POA: Insufficient documentation

## 2017-10-11 DIAGNOSIS — Z87891 Personal history of nicotine dependence: Secondary | ICD-10-CM | POA: Insufficient documentation

## 2017-10-11 LAB — I-STAT TROPONIN, ED: Troponin i, poc: 0 ng/mL (ref 0.00–0.08)

## 2017-10-11 MED ORDER — IBUPROFEN 800 MG PO TABS: 800.0000 mg | ORAL_TABLET | Freq: Three times a day (TID) | ORAL | 0 refills | Status: DC

## 2017-10-11 NOTE — ED Notes (Signed)
Called several times for Pt. No answer

## 2017-10-11 NOTE — ED Triage Notes (Signed)
Patient reports L sided chest pain for the past week that is intermittent. States pain comes when she "gets excited" states she was here yesterday afternoon but had to leave due to the wait time-was never seen by a provider. Pt a/ox4, resp e/u, nad.

## 2017-10-11 NOTE — ED Provider Notes (Signed)
Emergency Department Provider Note   I have reviewed the triage vital signs and the nursing notes.   HISTORY  Chief Complaint Chest Pain   HPI [name] is a 47 y.o. female who presents the emergency department with lower right-sided chest pain for the last few days.  Patient states is intermittent.  Seems to happen with certain movements and with palpation.  She thinks it started after lifting her 40 pound niece on Friday.  He also got worse on Sunday while cheering at a football game.  She has no shortness of breath, nausea, vomiting, lightheadedness.  She has no significant past medical history.  No trauma otherwise.  No fevers, cough.  States she went to urgent care who sent her here yesterday for evaluation but she had to go to work so she had to leave.  At that time she took some ibuprofen and said that she slept better than she has at all since this all started. No other associated or modifying symptoms.    Past Medical History:  Diagnosis Date  . Anemia   . Fibroids, submucosal   . History of blood transfusion 02/2011   [facility] - ? 2-3 units transfused    Patient Active Problem List   Diagnosis Date Noted  . Postoperative state 04/16/2013  . Hematometra 01/09/2013  . Menorrhagia 01/09/2013  . Chronic pelvic pain in female 01/09/2013    Past Surgical History:  Procedure Laterality Date  . APPENDECTOMY    . BILATERAL SALPINGECTOMY Bilateral 04/16/2013   Procedure: BILATERAL SALPINGECTOMY;  Surgeon: Lavonia Drafts, MD;  Location: [facility];  Service: Gynecology;  Laterality: Bilateral;  . [place], 2002  . DILATION AND CURETTAGE OF UTERUS N/A 03/31/2012   Procedure: DILATATION AND CURETTAGE;  Surgeon: Emily Filbert, MD;  Location: [facility];  Service: Gynecology;  Laterality: N/A;  . LAPAROSCOPIC LYSIS OF ADHESIONS N/A 03/31/2012   Procedure: LAPAROSCOPIC LYSIS OF ADHESIONS;  Surgeon: Emily Filbert, MD;  Location: [facility];  Service: Gynecology;   Laterality: N/A;  . LAPAROSCOPY N/A 03/31/2012   Procedure: LAPAROSCOPY OPERATIVE;  Surgeon: Emily Filbert, MD;  Location: [facility];  Service: Gynecology;  Laterality: N/A;  . NOVASURE ABLATION N/A 03/31/2012   Procedure: NOVASURE ABLATION;  Surgeon: Emily Filbert, MD;  Location: [facility];  Service: Gynecology;  Laterality: N/A;  . ROBOTIC ASSISTED TOTAL HYSTERECTOMY N/A 04/16/2013   Procedure: ROBOTIC ASSISTED TOTAL HYSTERECTOMY;  Surgeon: Lavonia Drafts, MD;  Location: [facility];  Service: Gynecology;  Laterality: N/A;  . TUBAL LIGATION  2002  . WISDOM TOOTH EXTRACTION      Current Outpatient Rx  . Order #: [id number] Class: Normal  . Order #: [id number] Class: Normal  . Order #: [id number] Class: Normal  . Order #: [id number] Class: Normal    Allergies Bee venom and Bactrim [sulfamethoxazole-trimethoprim]  Family History  Problem Relation Age of Onset  . Fibroids Sister     Social History Social History   Tobacco Use  . Smoking status: Former Smoker    Packs/day: 0.00    Types: Cigarettes    Last attempt to quit: 01/21/2011    Years since quitting: 6.7  . Smokeless tobacco: Never Used  Substance Use Topics  . Alcohol use: No    Comment: social occasions  . Drug use: No    Review of Systems  All other systems negative except as documented in the HPI. All pertinent positives and negatives as reviewed in the HPI. ____________________________________________   PHYSICAL EXAM:  VITAL SIGNS: ED Triage Vitals  Enc Vitals Group     BP 10/11/17 0706 132/79     Pulse Rate 10/11/17 0706 (!) 55     Resp 10/11/17 0706 15     Temp 10/11/17 0706 98.8 F (37.1 C)     Temp Source 10/11/17 0706 Oral     SpO2 10/11/17 0706 99 %    Constitutional: Alert and oriented. Well appearing and in no acute distress. Eyes: Conjunctivae are normal. PERRL. EOMI. Head: Atraumatic. Nose: No congestion/rhinnorhea. Mouth/Throat: Mucous membranes are moist.  Oropharynx non-erythematous. Neck: No  stridor.  No meningeal signs.   Cardiovascular: Normal rate, regular rhythm. Good peripheral circulation. Grossly normal heart sounds.   Respiratory: Normal respiratory effort.  No retractions. Lungs CTAB. Gastrointestinal: Soft and nontender. No distention.  Musculoskeletal: No lower extremity tenderness nor edema. No gross deformities of extremities. ttp over right lower costochondral angle. No rash. Neurologic:  Normal speech and language. No gross focal neurologic deficits are appreciated.  Skin:  Skin is warm, dry and intact. No rash noted.   ____________________________________________   LABS (all labs ordered are listed, but only abnormal results are displayed)  Labs Reviewed  I-STAT TROPONIN, ED   ____________________________________________  EKG   EKG Interpretation  Date/Time:  Tuesday October 11 2017 07:09:32 EDT Ventricular Rate:  70 PR Interval:  140 QRS Duration: 82 QT Interval:  426 QTC Calculation: 460 R Axis:   71 Text Interpretation:  Normal sinus rhythm Normal ECG When compared with ECG of 10/10/2017, No significant change was found Confirmed by Delora Fuel ([id number]) on 10/11/2017 7:09:49 AM Also confirmed by Delora Fuel ([id number]), editor Philomena Doheny [phone])  on 10/11/2017 7:32:14 AM       ____________________________________________  RADIOLOGY  Dg Chest 2 View  Result Date: 10/10/2017 CLINICAL DATA:  Intermittent LEFT-sided chest pain since Friday. EXAM: CHEST - 2 VIEW COMPARISON:  Chest x-ray dated 09/01/2015. FINDINGS: Heart size and mediastinal contours are within normal limits. Lungs are clear. No pleural effusion or pneumothorax seen. No acute or suspicious osseous finding. Mild degenerative change within the thoracic spine. IMPRESSION: No acute findings.  No evidence of pneumonia or pulmonary edema. Electronically Signed   By: Franki Cabot M.D.   On: 10/10/2017 19:26    ____________________________________________   PROCEDURES  Procedure(s)  performed:   Procedures   ____________________________________________   INITIAL IMPRESSION / ASSESSMENT AND PLAN / ED COURSE  ECG and labs reassurring.  Suspect Costochondritis versus muscle sprain or other musculoskeletal cause.  Low suspicion for ACS, PE, pneumothorax or pneumonia at this time.  Stable for discharge with anti-inflammatories and PCP follow-up.   Pertinent labs & imaging results that were available during my care of the patient were reviewed by me and considered in my medical decision making (see chart for details).  ____________________________________________  FINAL CLINICAL IMPRESSION(S) / ED DIAGNOSES  Final diagnoses:  Nonspecific chest pain     MEDICATIONS GIVEN DURING THIS VISIT:  Medications - No data to display   NEW OUTPATIENT MEDICATIONS STARTED DURING THIS VISIT:  New Prescriptions   IBUPROFEN (ADVIL,MOTRIN) 800 MG TABLET    Take 1 tablet (800 mg total) by mouth 3 (three) times daily.    Note:  This note was prepared with assistance of Dragon voice recognition software. Occasional wrong-word or sound-a-like substitutions may have occurred due to the inherent limitations of voice recognition software.   Merrily Pew, MD 10/11/17 1053

## 2017-10-13 ENCOUNTER — Ambulatory Visit (HOSPITAL_COMMUNITY)
Admission: EM | Admit: 2017-10-13 | Discharge: 2017-10-13 | Disposition: A | Discharge status: Home / Self Care | Payer: Self-pay | Attending: Family Medicine | Admitting: Family Medicine

## 2017-10-13 ENCOUNTER — Encounter (HOSPITAL_COMMUNITY): Payer: Self-pay | Admitting: Emergency Medicine

## 2017-10-13 DIAGNOSIS — M779 Enthesopathy, unspecified: Secondary | ICD-10-CM

## 2017-10-13 MED ORDER — PREDNISONE 10 MG (21) PO TBPK: ORAL_TABLET | ORAL | 0 refills | Status: DC

## 2017-10-13 NOTE — ED Provider Notes (Signed)
[facility]    CSN: [id number] Arrival date & time: 10/13/17  1000     History   Chief Complaint Chief Complaint  Patient presents with  . Wrist Pain    HPI [name] is a 47 y.o. female.   Patient is a 47 year old female presents with 2 days of right forearm/wrist pain.  Symptoms have been constant and worse with certain movements of the wrist and arm.  She describes it as a cool/burning sensation and numb and tingling going at times.  No weakness to the arm or hand.  She denies any injury to the wrist or hand.  She is concerned of blood clot because she had blood drawn in the right AC a few days ago.  She denies any history of blood clots.  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  She has not taken anything for her symptoms.  ROS per HPI      Past Medical History:  Diagnosis Date  . Anemia   . Fibroids, submucosal   . History of blood transfusion 02/2011   [facility] - ? 2-3 units transfused    Patient Active Problem List   Diagnosis Date Noted  . Postoperative state 04/16/2013  . Hematometra 01/09/2013  . Menorrhagia 01/09/2013  . Chronic pelvic pain in female 01/09/2013    Past Surgical History:  Procedure Laterality Date  . APPENDECTOMY    . BILATERAL SALPINGECTOMY Bilateral 04/16/2013   Procedure: BILATERAL SALPINGECTOMY;  Surgeon: Lavonia Drafts, MD;  Location: [facility];  Service: Gynecology;  Laterality: Bilateral;  . [place], 2002  . DILATION AND CURETTAGE OF UTERUS N/A 03/31/2012   Procedure: DILATATION AND CURETTAGE;  Surgeon: Emily Filbert, MD;  Location: [facility];  Service: Gynecology;  Laterality: N/A;  . LAPAROSCOPIC LYSIS OF ADHESIONS N/A 03/31/2012   Procedure: LAPAROSCOPIC LYSIS OF ADHESIONS;  Surgeon: Emily Filbert, MD;  Location: [facility];  Service: Gynecology;  Laterality: N/A;  . LAPAROSCOPY N/A 03/31/2012   Procedure: LAPAROSCOPY OPERATIVE;  Surgeon: Emily Filbert, MD;  Location: [facility];   Service: Gynecology;  Laterality: N/A;  . NOVASURE ABLATION N/A 03/31/2012   Procedure: NOVASURE ABLATION;  Surgeon: Emily Filbert, MD;  Location: [facility];  Service: Gynecology;  Laterality: N/A;  . ROBOTIC ASSISTED TOTAL HYSTERECTOMY N/A 04/16/2013   Procedure: ROBOTIC ASSISTED TOTAL HYSTERECTOMY;  Surgeon: Lavonia Drafts, MD;  Location: [facility];  Service: Gynecology;  Laterality: N/A;  . TUBAL LIGATION  2002  . WISDOM TOOTH EXTRACTION      OB History    Gravida  3   Para  2   Term  2   Preterm      AB  1   Living  2     SAB  1   TAB      Ectopic      Multiple      Live Births               Home Medications    Prior to Admission medications   Medication Sig Start Date End Date Taking? Authorizing Provider  benzonatate (TESSALON) 100 MG capsule Take 1 capsule (100 mg total) by mouth every 8 (eight) hours. 02/06/16   Barnet Glasgow, NP  ibuprofen (ADVIL,MOTRIN) 800 MG tablet Take 1 tablet (800 mg total) by mouth 3 (three) times daily. 10/11/17   Mesner, Corene Cornea, MD  meloxicam (MOBIC) 15 MG tablet Take 1 tablet (15 mg total) by mouth daily.  02/06/16   Barnet Glasgow, NP  predniSONE (STERAPRED UNI-PAK 21 TAB) 10 MG (21) TBPK tablet 6 tabs for 1 day, then 5 tabs for 1 das, then 4 tabs for 1 day, then 3 tabs for 1 day, 2 tabs for 1 day, then 1 tab for 1 day 10/13/17   Orvan July, NP    Family History Family History  Problem Relation Age of Onset  . Fibroids Sister     Social History Social History   Tobacco Use  . Smoking status: Former Smoker    Packs/day: 0.00    Types: Cigarettes    Last attempt to quit: 01/21/2011    Years since quitting: 6.7  . Smokeless tobacco: Never Used  Substance Use Topics  . Alcohol use: No    Comment: social occasions  . Drug use: No     Allergies   Bee venom and Bactrim [sulfamethoxazole-trimethoprim]   Review of Systems Review of Systems   Physical Exam Triage Vital Signs ED Triage Vitals  Enc Vitals  Group     BP 10/13/17 1049 134/86     Pulse Rate 10/13/17 1049 68     Resp 10/13/17 1049 16     Temp 10/13/17 1049 98.4 F (36.9 C)     Temp Source 10/13/17 1049 Oral     SpO2 10/13/17 1049 100 %     Weight 10/13/17 1050 235 lb (106.6 kg)     Height --      Head Circumference --      Peak Flow --      Pain Score 10/13/17 1050 2     Pain Loc --      Pain Edu? --      Excl. in [place]? --    No data found.  Updated Vital Signs BP 134/86   Pulse 68   Temp 98.4 F (36.9 C) (Oral)   Resp 16   Wt 235 lb (106.6 kg)   LMP 03/31/2013   SpO2 100%   BMI 39.11 kg/m   Visual Acuity Right Eye Distance:   Left Eye Distance:   Bilateral Distance:    Right Eye Near:   Left Eye Near:    Bilateral Near:     Physical Exam  Constitutional: She is oriented to person, place, and time. She appears well-developed and well-nourished.  Very pleasant. Non toxic or ill appearing.     HENT:  Head: Normocephalic and atraumatic.  Eyes: Conjunctivae are normal.  Neck: Normal range of motion.  Pulmonary/Chest: Effort normal.  Musculoskeletal:  Good range of motion of right wrist and hand. Negative tinnel and negative Finkelstein Slight swelling to right forearm without erythema, bruising, deformity. Radial pulse and sensation intact   Neurological: She is alert and oriented to person, place, and time.  Skin: Skin is warm and dry.  Nursing note and vitals reviewed.    UC Treatments / Results  Labs (all labs ordered are listed, but only abnormal results are displayed) Labs Reviewed - No data to display  EKG None  Radiology No results found.  Procedures Procedures (including critical care time)  Medications Ordered in UC Medications - No data to display  Initial Impression / Assessment and Plan / UC Course  I have reviewed the triage vital signs and the nursing notes.  Pertinent labs & imaging results that were available during my care of the patient were reviewed by me and  considered in my medical decision making (see chart for details).  No concern for blood clot. Most likely tendinitis Will give wrist splint and treat with prednisone. Instructed to follow-up for worsening symptoms. Final Clinical Impressions(s) / UC Diagnoses   Final diagnoses:  Tendonitis     Discharge Instructions     It was nice meeting you!!  I believe that your pain is due to tendinitis We will try a prednisone taper and wrist splint Follow-up for any worsening symptoms    ED Prescriptions    Medication Sig Dispense Auth. Provider   predniSONE (STERAPRED UNI-PAK 21 TAB) 10 MG (21) TBPK tablet 6 tabs for 1 day, then 5 tabs for 1 das, then 4 tabs for 1 day, then 3 tabs for 1 day, 2 tabs for 1 day, then 1 tab for 1 day 21 tablet Rozanna Box, Kiwanna Spraker A, NP     Controlled Substance Prescriptions [place] Controlled Substance Registry consulted? Not Applicable   Orvan July, NP 10/13/17 1119    Wynona Luna, MD 10/25/17 [phone]

## 2017-10-13 NOTE — ED Triage Notes (Signed)
PT was seen in the ER Monday and Tuesday for chest pain eval. Workup was negative. PT had blood drawn twice from left [facility]. PT reports a "catching" sensation in left lower arm and some swelling. PT is worried about a blood clot or nerve issue

## 2017-10-13 NOTE — Discharge Instructions (Signed)
It was nice meeting you!!  I believe that your pain is due to tendinitis We will try a prednisone taper and wrist splint Follow-up for any worsening symptoms

## 2017-12-01 ENCOUNTER — Encounter: Payer: Self-pay | Admitting: Family Medicine

## 2017-12-01 ENCOUNTER — Ambulatory Visit: Payer: Self-pay | Admitting: Family Medicine

## 2017-12-01 VITALS — BP 150/72 | HR 78 | Temp 98.5°F | Wt 227.8 lb

## 2017-12-01 DIAGNOSIS — J069 Acute upper respiratory infection, unspecified: Secondary | ICD-10-CM

## 2017-12-01 DIAGNOSIS — R05 Cough: Secondary | ICD-10-CM

## 2017-12-01 DIAGNOSIS — R059 Cough, unspecified: Secondary | ICD-10-CM

## 2017-12-01 DIAGNOSIS — H6691 Otitis media, unspecified, right ear: Secondary | ICD-10-CM

## 2017-12-01 MED ORDER — AMOXICILLIN 875 MG PO TABS: 875.0000 mg | ORAL_TABLET | Freq: Two times a day (BID) | ORAL | 0 refills | Status: AC

## 2017-12-01 MED ORDER — AZELASTINE HCL 0.1 % NA SOLN: 1.0000 | Freq: Two times a day (BID) | NASAL | 0 refills | Status: DC

## 2017-12-01 MED ORDER — BENZONATATE 200 MG PO CAPS: 200.0000 mg | ORAL_CAPSULE | Freq: Three times a day (TID) | ORAL | 0 refills | Status: DC | PRN

## 2017-12-01 NOTE — Progress Notes (Signed)
[name] is a 47 y.o. female who presents today with concerns of cough and congestion intermittently for the last two weeks after exposure to her sick daughter. She reports that symptoms will wax and wane but new onset ear pain to the right side over the past 72 hours. She denies fever of chills but just reports general feeling of unwell.  Review of Systems  Constitutional: Negative for chills, fever and malaise/fatigue.  HENT: Positive for ear pain and sore throat. Negative for congestion, ear discharge and sinus pain.   Eyes: Negative.   Respiratory: Positive for cough. Negative for sputum production and shortness of breath.   Cardiovascular: Negative.  Negative for chest pain.  Gastrointestinal: Negative for abdominal pain, diarrhea, nausea and vomiting.  Genitourinary: Negative for dysuria, frequency, hematuria and urgency.  Musculoskeletal: Negative for myalgias.  Skin: Negative.   Neurological: Negative for headaches.  Endo/Heme/Allergies: Negative.   Psychiatric/Behavioral: Negative.     O: Vitals:   12/01/17 0915  BP: (!) 150/72  Pulse: 78  Temp: 98.5 F (36.9 C)  SpO2: 99%     Physical Exam  Constitutional: She is oriented to person, place, and time. Vital signs are normal. She appears well-developed and well-nourished. She is active.  Non-toxic appearance. She does not have a sickly appearance.  HENT:  Head: Normocephalic.  Right Ear: Hearing, external ear and ear canal normal. Tympanic membrane is injected and erythematous.  Left Ear: Hearing, tympanic membrane, external ear and ear canal normal.  Nose: Nose normal.  Mouth/Throat: Uvula is midline and oropharynx is clear and moist.  Neck: Normal range of motion. Neck supple.  Cardiovascular: Normal rate, regular rhythm, normal heart sounds and normal pulses.  Pulmonary/Chest: Effort normal. She has rales in the right upper field, the right middle field, the left upper field and the left middle field.   Abdominal: Soft. Bowel sounds are normal.  Musculoskeletal: Normal range of motion.  Lymphadenopathy:       Head (right side): No submental and no submandibular adenopathy present.       Head (left side): No submental and no submandibular adenopathy present.    She has no cervical adenopathy.  Neurological: She is alert and oriented to person, place, and time.  Psychiatric: She has a normal mood and affect.  Vitals reviewed.  A: 1. Cough   2. Right otitis media, unspecified otitis media type   3. Upper respiratory tract infection, unspecified type    P: Discussed exam findings, diagnosis etiology and medication use and indications reviewed with patient. Follow- Up and discharge instructions provided. No emergent/urgent issues found on exam.  Patient verbalized understanding of information provided and agrees with plan of care (POC), all questions answered.  1. Cough - benzonatate (TESSALON) 200 MG capsule; Take 1 capsule (200 mg total) by mouth 3 (three) times daily as needed.  2. Right otitis media, unspecified otitis media type - amoxicillin (AMOXIL) 875 MG tablet; Take 1 tablet (875 mg total) by mouth 2 (two) times daily for 7 days.  3. Upper respiratory tract infection, unspecified type - amoxicillin (AMOXIL) 875 MG tablet; Take 1 tablet (875 mg total) by mouth 2 (two) times daily for 7 days. - azelastine (ASTELIN) 0.1 % nasal spray; Place 1 spray into both nostrils 2 (two) times daily. Use in each nostril as directed

## 2017-12-01 NOTE — Patient Instructions (Signed)
Upper Respiratory Infection, Adult Most upper respiratory infections (URIs) are caused by a virus. A URI affects the nose, throat, and upper air passages. The most common type of URI is often called "the common cold." Follow these instructions at home:  Take medicines only as told by your doctor.  Gargle warm saltwater or take cough drops to comfort your throat as told by your doctor.  Use a warm mist humidifier or inhale steam from a shower to increase air moisture. This may make it easier to breathe.  Drink enough fluid to keep your pee (urine) clear or pale yellow.  Eat soups and other clear broths.  Have a healthy diet.  Rest as needed.  Go back to work when your fever is gone or your doctor says it is okay. ? You may need to stay home longer to avoid giving your URI to others. ? You can also wear a face mask and wash your hands often to prevent spread of the virus.  Use your inhaler more if you have asthma.  Do not use any tobacco products, including cigarettes, chewing tobacco, or electronic cigarettes. If you need help quitting, ask your doctor. Contact a doctor if:  You are getting worse, not better.  Your symptoms are not helped by medicine.  You have chills.  You are getting more short of breath.  You have brown or red mucus.  You have yellow or brown discharge from your nose.  You have pain in your face, especially when you bend forward.  You have a fever.  You have puffy (swollen) neck glands.  You have pain while swallowing.  You have white areas in the back of your throat. Get help right away if:  You have very bad or constant: ? Headache. ? Ear pain. ? Pain in your forehead, behind your eyes, and over your cheekbones (sinus pain). ? Chest pain.  You have long-lasting (chronic) lung disease and any of the following: ? Wheezing. ? Long-lasting cough. ? Coughing up blood. ? A change in your usual mucus.  You have a stiff neck.  You have  changes in your: ? Vision. ? Hearing. ? Thinking. ? Mood. This information is not intended to replace advice given to you by your health care provider. Make sure you discuss any questions you have with your health care provider. Document Released: 06/23/2007 Document Revised: 09/07/2015 Document Reviewed: 04/11/2013 Elsevier Interactive Patient Education  2018 Elsevier Inc.  

## 2017-12-02 ENCOUNTER — Telehealth: Payer: Self-pay | Admitting: Family Medicine

## 2017-12-02 DIAGNOSIS — B373 Candidiasis of vulva and vagina: Secondary | ICD-10-CM

## 2017-12-02 DIAGNOSIS — B3731 Acute candidiasis of vulva and vagina: Secondary | ICD-10-CM

## 2017-12-02 MED ORDER — FLUCONAZOLE 150 MG PO TABS: 150.0000 mg | ORAL_TABLET | ORAL | 0 refills | Status: AC

## 2017-12-02 NOTE — Telephone Encounter (Signed)
Patient reports vaginal itching and yeast infection with antibiotic use. Advised take 1 tablet of fluconazole now and 1 tab in 7 days when treatment is complete.

## 2018-05-16 ENCOUNTER — Encounter (HOSPITAL_COMMUNITY): Payer: Self-pay | Admitting: Emergency Medicine

## 2018-05-16 ENCOUNTER — Emergency Department (HOSPITAL_COMMUNITY): Payer: BLUE CROSS/BLUE SHIELD

## 2018-05-16 ENCOUNTER — Other Ambulatory Visit: Payer: Self-pay

## 2018-05-16 ENCOUNTER — Emergency Department (HOSPITAL_COMMUNITY)
Admission: EM | Admit: 2018-05-16 | Discharge: 2018-05-17 | Disposition: A | Discharge status: Home / Self Care | Payer: BLUE CROSS/BLUE SHIELD | Attending: Emergency Medicine | Admitting: Emergency Medicine

## 2018-05-16 DIAGNOSIS — Z79899 Other long term (current) drug therapy: Secondary | ICD-10-CM | POA: Insufficient documentation

## 2018-05-16 DIAGNOSIS — Z20828 Contact with and (suspected) exposure to other viral communicable diseases: Secondary | ICD-10-CM | POA: Insufficient documentation

## 2018-05-16 DIAGNOSIS — R072 Precordial pain: Secondary | ICD-10-CM | POA: Diagnosis not present

## 2018-05-16 DIAGNOSIS — Z87891 Personal history of nicotine dependence: Secondary | ICD-10-CM | POA: Insufficient documentation

## 2018-05-16 DIAGNOSIS — R079 Chest pain, unspecified: Secondary | ICD-10-CM | POA: Diagnosis present

## 2018-05-16 LAB — CBC
HCT: 35.3 % — ABNORMAL LOW (ref 36.0–46.0)
Hemoglobin: 11.8 g/dL — ABNORMAL LOW (ref 12.0–15.0)
MCH: 26.8 pg (ref 26.0–34.0)
MCHC: 33.4 g/dL (ref 30.0–36.0)
MCV: 80.2 fL (ref 80.0–100.0)
Platelets: 299 10*3/uL (ref 150–400)
RBC: 4.4 MIL/uL (ref 3.87–5.11)
RDW: 13.9 % (ref 11.5–15.5)
WBC: 7.2 10*3/uL (ref 4.0–10.5)
nRBC: 0 % (ref 0.0–0.2)

## 2018-05-16 LAB — I-STAT BETA HCG BLOOD, ED (MC, WL, AP ONLY): I-stat hCG, quantitative: <5 m[IU]/mL (ref <5)

## 2018-05-16 LAB — BASIC METABOLIC PANEL
Anion gap: 12 (ref 5–15)
BUN: 14 mg/dL (ref 6–20)
CO2: 21 mmol/L — ABNORMAL LOW (ref 22–32)
Calcium: 9.2 mg/dL (ref 8.9–10.3)
Chloride: 105 mmol/L (ref 98–111)
Creatinine, Ser: 0.74 mg/dL (ref 0.44–1.00)
GFR calc Af Amer: >60 mL/min (ref >60)
GFR calc non Af Amer: >60 mL/min (ref >60)
Glucose, Bld: 89 mg/dL (ref 70–99)
Potassium: 4 mmol/L (ref 3.5–5.1)
Sodium: 138 mmol/L (ref 135–145)

## 2018-05-16 LAB — TROPONIN I: Troponin I: <0.03 ng/mL (ref <0.03)

## 2018-05-16 MED ORDER — SODIUM CHLORIDE 0.9% FLUSH: 3.0000 mL | Freq: Once | INTRAVENOUS | Status: DC

## 2018-05-16 NOTE — ED Triage Notes (Signed)
Pt presents with chest pain that started 20 minutes. Pt states the pain is midsternal and feels like it is a pressure.

## 2018-05-17 LAB — TROPONIN I: Troponin I: <0.03 ng/mL (ref <0.03)

## 2018-05-17 NOTE — ED Provider Notes (Signed)
[facility] EMERGENCY DEPARTMENT Provider Note   CSN: [id number] Arrival date & time: 05/16/18  2014    History   Chief Complaint Chief Complaint  Patient presents with  . Chest Pain    HPI [name] is a 48 y.o. female.     The history is provided by the patient.  Chest Pain  Associated symptoms: fatigue and headache   A 48 year old patient presents for evaluation of chest pain. Initial onset of pain was approximately 1-3 hours ago. The patient's chest pain is described as heaviness/pressure/tightness and is not worse with exertion. The patient complains of nausea. The patient's chest pain is middle- or left-sided, is not well-localized, is not sharp and does not radiate to the arms/jaw/neck. The patient denies diaphoresis. The patient has no history of stroke, has no history of peripheral artery disease, has not smoked in the past 90 days, denies any history of treated diabetes, has no relevant family history of coronary artery disease (first degree relative at less than age 56), is not hypertensive, has no history of hypercholesterolemia.  She reports over the past week she has not felt well.  She has had myalgias, intermittent cough, and headaches.  No recorded fever but has felt warm.  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 Past Medical History:  Diagnosis Date  . Anemia   . Fibroids, submucosal   . History of blood transfusion 02/2011   [facility] - ? 2-3 units transfused    Patient Active Problem List   Diagnosis Date Noted  . Postoperative state 04/16/2013  . Hematometra 01/09/2013  . Menorrhagia 01/09/2013  . Chronic pelvic pain in female 01/09/2013    Past Surgical History:  Procedure Laterality Date  . APPENDECTOMY    . BILATERAL SALPINGECTOMY Bilateral 04/16/2013   Procedure: BILATERAL SALPINGECTOMY;  Surgeon: Lavonia Drafts, MD;  Location: [facility];  Service: Gynecology;   Laterality: Bilateral;  . [place], 2002  . DILATION AND CURETTAGE OF UTERUS N/A 03/31/2012   Procedure: DILATATION AND CURETTAGE;  Surgeon: Emily Filbert, MD;  Location: [facility];  Service: Gynecology;  Laterality: N/A;  . LAPAROSCOPIC LYSIS OF ADHESIONS N/A 03/31/2012   Procedure: LAPAROSCOPIC LYSIS OF ADHESIONS;  Surgeon: Emily Filbert, MD;  Location: [facility];  Service: Gynecology;  Laterality: N/A;  . LAPAROSCOPY N/A 03/31/2012   Procedure: LAPAROSCOPY OPERATIVE;  Surgeon: Emily Filbert, MD;  Location: [facility];  Service: Gynecology;  Laterality: N/A;  . NOVASURE ABLATION N/A 03/31/2012   Procedure: NOVASURE ABLATION;  Surgeon: Emily Filbert, MD;  Location: [facility];  Service: Gynecology;  Laterality: N/A;  . ROBOTIC ASSISTED TOTAL HYSTERECTOMY N/A 04/16/2013   Procedure: ROBOTIC ASSISTED TOTAL HYSTERECTOMY;  Surgeon: Lavonia Drafts, MD;  Location: [facility];  Service: Gynecology;  Laterality: N/A;  . TUBAL LIGATION  2002  . WISDOM TOOTH EXTRACTION       OB History    Gravida  3   Para  2   Term  2   Preterm      AB  1   Living  2     SAB  1   TAB      Ectopic      Multiple      Live Births               Home Medications    Prior to Admission medications   Medication Sig Start Date End  Date Taking? Authorizing Provider  azelastine (ASTELIN) 0.1 % nasal spray Place 1 spray into both nostrils 2 (two) times daily. Use in each nostril as directed 12/01/17   Shella Maxim, NP    Family History Family History  Problem Relation Age of Onset  . Fibroids Sister     Social History Social History   Tobacco Use  . Smoking status: Former Smoker    Packs/day: 0.00    Types: Cigarettes    Last attempt to quit: 01/21/2011    Years since quitting: 7.3  . Smokeless tobacco: Never Used  Substance Use Topics  . Alcohol use: No    Comment: social occasions  . Drug use: No     Allergies   Bee venom and Bactrim [sulfamethoxazole-trimethoprim]   Review of  Systems Review of Systems  Constitutional: Positive for fatigue.  Cardiovascular: Positive for chest pain.  Musculoskeletal: Positive for myalgias.  Neurological: Positive for headaches.  Psychiatric/Behavioral: The patient is nervous/anxious.   All other systems reviewed and are negative.    Physical Exam Updated Vital Signs BP (!) 125/55   Pulse 67   Temp 98.7 F (37.1 C) (Oral)   Resp 14   Ht 1.651 m (5\' 5" )   Wt 99.8 kg   LMP 03/31/2013 (Exact Date)   SpO2 99%   BMI 36.61 kg/m   Physical Exam CONSTITUTIONAL: Well developed/well nourished, anxious and tearful HEAD: Normocephalic/atraumatic EYES: EOMI  ENMT: Mucous membranes moist NECK: supple no meningeal signs SPINE/BACK:entire spine nontender CV: S1/S2 noted, no murmurs/rubs/gallops noted LUNGS: Lungs are clear to auscultation bilaterally, no apparent distress ABDOMEN: soft, nontender, no rebound or guarding, bowel sounds noted throughout abdomen GU:no cva tenderness NEURO: Pt is awake/alert/appropriate, moves all extremitiesx4.  No facial droop.   EXTREMITIES: pulses normal/equal, full ROM, no lower extremity edema or tenderness SKIN: warm, color normal PSYCH: Anxious  ED Treatments / Results  Labs (all labs ordered are listed, but only abnormal results are displayed) Labs Reviewed  BASIC METABOLIC PANEL - Abnormal; Notable for the following components:      Result Value   CO2 21 (*)    All other components within normal limits  CBC - Abnormal; Notable for the following components:   Hemoglobin 11.8 (*)    HCT 35.3 (*)    All other components within normal limits  NOVEL CORONAVIRUS, NAA (HOSPITAL ORDER, SEND-OUT TO REF LAB)  TROPONIN I  TROPONIN I  I-STAT BETA HCG BLOOD, ED ([facility], [facility], [facility] ONLY)    EKG EKG Interpretation  Date/Time:  Tuesday May 16 2018 20:50:29 EDT Ventricular Rate:  82 PR Interval:  138 QRS Duration: 78 QT Interval:  392 QTC Calculation: 457 R Axis:   60 Text Interpretation:   Normal sinus rhythm Normal ECG No significant change since last tracing Confirmed by Ripley Fraise [phone]) on 05/16/2018 11:11:38 PM   Radiology Dg Chest 2 View  Result Date: 05/16/2018 CLINICAL DATA:  Midsternal chest pain beginning 20 minutes ago. Shortness of breath. Nausea. EXAM: CHEST - 2 VIEW COMPARISON:  10/10/2017 FINDINGS: The heart size and mediastinal contours are within normal limits. Both lungs are clear. Mild thoracic spine degenerative changes noted. IMPRESSION: No active cardiopulmonary disease. Electronically Signed   By: Earle Gell M.D.   On: 05/16/2018 21:16    Procedures Procedures    Medications Ordered in ED Medications  sodium chloride flush (NS) 0.9 % injection 3 mL (has no administration in time range)     Initial Impression / Assessment and Plan /  ED Course  I have reviewed the triage vital signs and the nursing notes.  Pertinent labs & imaging results that were available during my care of the patient were reviewed by me and considered in my medical decision making (see chart for details).        1:02 AM Heart score less than 3. She Is PERC negative. Patient appears very anxious and tearful. Will repeat troponin.  No EKG changes and chest x-ray is negative.  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  Vitals are appropriate.  Repeat troponin is negative.  My suspicion for ACS/PE is low. Patient feels comfortable for discharge home.  We discussed strict ER return precautions.  She will be taken out of work, COVID-19 testing should be back in next few days.    [name] was evaluated in Emergency Department on 05/17/2018 for the symptoms described in the history of present illness. She was evaluated in the context of the global COVID-19 pandemic, which necessitated consideration that the patient might be at risk for infection with the SARS-CoV-2 virus that causes COVID-19.  Institutional protocols and algorithms that pertain to the evaluation of patients at risk for COVID-19 are in a state of rapid change based on information released by regulatory bodies including the CDC and federal and state organizations. These policies and algorithms were followed during the patient's care in the ED.  Final Clinical Impressions(s) / ED Diagnoses   Final diagnoses:  Precordial pain    ED Discharge Orders    None       Ripley Fraise, MD 05/17/18 [phone]

## 2018-05-17 NOTE — Discharge Instructions (Signed)
Your caregiver has diagnosed you as having chest pain that is not specific for one problem, but does not require admission.  Chest pain comes from many different causes.  SEEK IMMEDIATE MEDICAL ATTENTION IF: You have severe chest pain, especially if the pain is crushing or pressure-like and spreads to the arms, back, neck, or jaw, or if you have sweating, nausea (feeling sick to your stomach), or shortness of breath. THIS IS AN EMERGENCY. Don't wait to see if the pain will go away. Get medical help at once. Call 911 or 0 (operator). DO NOT drive yourself to the hospital.  Your chest pain gets worse and does not go away with rest.  You have an attack of chest pain lasting longer than usual, despite rest and treatment with the medications your caregiver has prescribed.  You wake from sleep with chest pain or shortness of breath.  You feel dizzy or faint.  You have chest pain not typical of your usual pain for which you originally saw your caregiver.    Person Under Monitoring Name: [name]  Location: [place] [address]   Infection Prevention Recommendations for Individuals Confirmed to have, or Being Evaluated for, 2019 Novel Coronavirus (COVID-19) Infection Who Receive Care at Home  Individuals who are confirmed to have, or are being evaluated for, COVID-19 should follow the prevention steps below until a healthcare provider or local or state health department says they can return to normal activities.  Stay home except to get medical care You should restrict activities outside your home, except for getting medical care. Do not go to work, school, or public areas, and do not use public transportation or taxis.  Call ahead before visiting your doctor Before your medical appointment, call the healthcare provider and tell them that you have, or are being evaluated for, COVID-19 infection. This will help the healthcare providers office take steps to keep other people  from getting infected. Ask your healthcare provider to call the local or state health department.  Monitor your symptoms Seek prompt medical attention if your illness is worsening (e.g., difficulty breathing). Before going to your medical appointment, call the healthcare provider and tell them that you have, or are being evaluated for, COVID-19 infection. Ask your healthcare provider to call the local or state health department.  Wear a facemask You should wear a facemask that covers your nose and mouth when you are in the same room with other people and when you visit a healthcare provider. People who live with or visit you should also wear a facemask while they are in the same room with you.  Separate yourself from other people in your home As much as possible, you should stay in a different room from other people in your home. Also, you should use a separate bathroom, if available.  Avoid sharing household items You should not share dishes, drinking glasses, cups, eating utensils, towels, bedding, or other items with other people in your home. After using these items, you should wash them thoroughly with soap and water.  Cover your coughs and sneezes Cover your mouth and nose with a tissue when you cough or sneeze, or you can cough or sneeze into your sleeve. Throw used tissues in a lined trash can, and immediately wash your hands with soap and water for at least 20 seconds or use an alcohol-based hand rub.  Wash your [facility] your hands often and thoroughly with soap and water for at least 20 seconds.  You can use an alcohol-based hand sanitizer if soap and water are not available and if your hands are not visibly dirty. Avoid touching your eyes, nose, and mouth with unwashed hands.   Prevention Steps for Caregivers and Household Members of Individuals Confirmed to have, or Being Evaluated for, COVID-19 Infection Being Cared for in the Home  If you live with, or provide care  at home for, a person confirmed to have, or being evaluated for, COVID-19 infection please follow these guidelines to prevent infection:  Follow healthcare providers instructions Make sure that you understand and can help the patient follow any healthcare provider instructions for all care.  Provide for the patients basic needs You should help the patient with basic needs in the home and provide support for getting groceries, prescriptions, and other personal needs.  Monitor the patients symptoms If they are getting sicker, call his or her medical provider and tell them that the patient has, or is being evaluated for, COVID-19 infection. This will help the healthcare providers office take steps to keep other people from getting infected. Ask the healthcare provider to call the local or state health department.  Limit the number of people who have contact with the patient If possible, have only one caregiver for the patient. Other household members should stay in another home or place of residence. If this is not possible, they should stay in another room, or be separated from the patient as much as possible. Use a separate bathroom, if available. Restrict visitors who do not have an essential need to be in the home.  Keep older adults, very young children, and other sick people away from the patient Keep older adults, very young children, and those who have compromised immune systems or chronic health conditions away from the patient. This includes people with chronic heart, lung, or kidney conditions, diabetes, and cancer.  Ensure good ventilation Make sure that shared spaces in the home have good air flow, such as from an air conditioner or an opened window, weather permitting.  Wash your hands often Wash your hands often and thoroughly with soap and water for at least 20 seconds. You can use an alcohol based hand sanitizer if soap and water are not available and if your hands are  not visibly dirty. Avoid touching your eyes, nose, and mouth with unwashed hands. Use disposable paper towels to dry your hands. If not available, use dedicated cloth towels and replace them when they become wet.  Wear a facemask and gloves Wear a disposable facemask at all times in the room and gloves when you touch or have contact with the patients blood, body fluids, and/or secretions or excretions, such as sweat, saliva, sputum, nasal mucus, vomit, urine, or feces.  Ensure the mask fits over your nose and mouth tightly, and do not touch it during use. Throw out disposable facemasks and gloves after using them. Do not reuse. Wash your hands immediately after removing your facemask and gloves. If your personal clothing becomes contaminated, carefully remove clothing and launder. Wash your hands after handling contaminated clothing. Place all used disposable facemasks, gloves, and other waste in a lined container before disposing them with other household waste. Remove gloves and wash your hands immediately after handling these items.  Do not share dishes, glasses, or other household items with the patient Avoid sharing household items. You should not share dishes, drinking glasses, cups, eating utensils, towels, bedding, or other items with a patient who is confirmed to have, or  being evaluated for, COVID-19 infection. After the person uses these items, you should wash them thoroughly with soap and water.  Wash laundry thoroughly Immediately remove and wash clothes or bedding that have blood, body fluids, and/or secretions or excretions, such as sweat, saliva, sputum, nasal mucus, vomit, urine, or feces, on them. Wear gloves when handling laundry from the patient. Read and follow directions on labels of laundry or clothing items and detergent. In general, wash and dry with the warmest temperatures recommended on the label.  Clean all areas the individual has used often Clean all touchable  surfaces, such as counters, tabletops, doorknobs, bathroom fixtures, toilets, phones, keyboards, tablets, and bedside tables, every day. Also, clean any surfaces that may have blood, body fluids, and/or secretions or excretions on them. Wear gloves when cleaning surfaces the patient has come in contact with. Use a diluted bleach solution (e.g., dilute bleach with 1 part bleach and 10 parts water) or a household disinfectant with a label that says EPA-registered for coronaviruses. To make a bleach solution at home, add 1 tablespoon of bleach to 1 quart (4 cups) of water. For a larger supply, add  cup of bleach to 1 gallon (16 cups) of water. Read labels of cleaning products and follow recommendations provided on product labels. Labels contain instructions for safe and effective use of the cleaning product including precautions you should take when applying the product, such as wearing gloves or eye protection and making sure you have good ventilation during use of the product. Remove gloves and wash hands immediately after cleaning.  Monitor yourself for signs and symptoms of illness Caregivers and household members are considered close contacts, should monitor their health, and will be asked to limit movement outside of the home to the extent possible. Follow the monitoring steps for close contacts listed on the symptom monitoring form.   ? If you have additional questions, contact your local health department or call the epidemiologist on call at [phone] (available 24/7). ? This guidance is subject to change. For the most up-to-date guidance from [facility], please refer to their website: YouBlogs.pl

## 2018-05-17 NOTE — ED Notes (Signed)
Patient verbalizes understanding of discharge instructions. Opportunity for questioning and answers were provided. Armband removed by staff, pt discharged from ED ambulatory.   

## 2018-05-18 LAB — NOVEL CORONAVIRUS, NAA (HOSP ORDER, SEND-OUT TO REF LAB; TAT 18-24 HRS): SARS-CoV-2, NAA: NOT DETECTED

## 2018-10-05 ENCOUNTER — Ambulatory Visit: Payer: BLUE CROSS/BLUE SHIELD | Admitting: Obstetrics & Gynecology

## 2018-10-16 ENCOUNTER — Ambulatory Visit (INDEPENDENT_AMBULATORY_CARE_PROVIDER_SITE_OTHER): Payer: Medicaid Other | Admitting: Family Medicine

## 2018-10-16 ENCOUNTER — Other Ambulatory Visit: Payer: Self-pay

## 2018-10-16 VITALS — BP 140/98 | HR 85 | Temp 98.2°F

## 2018-10-16 DIAGNOSIS — Z0000 Encounter for general adult medical examination without abnormal findings: Secondary | ICD-10-CM

## 2018-10-16 DIAGNOSIS — I10 Essential (primary) hypertension: Secondary | ICD-10-CM

## 2018-10-16 DIAGNOSIS — Z01419 Encounter for gynecological examination (general) (routine) without abnormal findings: Secondary | ICD-10-CM

## 2018-10-16 NOTE — Progress Notes (Signed)
GYNECOLOGY ANNUAL PREVENTATIVE CARE ENCOUNTER NOTE  Subjective:   [name] is a 48 y.o. G66P2012 female here for a routine annual gynecologic exam.  Current complaints: none.   Denies abnormal vaginal bleeding, discharge, pelvic pain, problems with intercourse or other gynecologic concerns.    Gynecologic History Patient's last menstrual period was 03/31/2013 (exact date). Patient is not sexually active  Contraception: status post hysterectomy Last Pap: 2013. Results were: normal Last mammogram: 2018. Results were: normal  Obstetric History OB History  Gravida Para Term Preterm AB Living  3 2 2   1 2   SAB TAB Ectopic Multiple Live Births  1            # Outcome Date GA Lbr Len/2nd Weight Sex Delivery Anes PTL Lv  3 Term           2 Term           1 SAB             Past Medical History:  Diagnosis Date  . Anemia   . Fibroids, submucosal   . History of blood transfusion 02/2011   [facility] - ? 2-3 units transfused    Past Surgical History:  Procedure Laterality Date  . APPENDECTOMY    . BILATERAL SALPINGECTOMY Bilateral 04/16/2013   Procedure: BILATERAL SALPINGECTOMY;  Surgeon: Lavonia Drafts, MD;  Location: [facility];  Service: Gynecology;  Laterality: Bilateral;  . [place], 2002  . DILATION AND CURETTAGE OF UTERUS N/A 03/31/2012   Procedure: DILATATION AND CURETTAGE;  Surgeon: Emily Filbert, MD;  Location: [facility];  Service: Gynecology;  Laterality: N/A;  . LAPAROSCOPIC LYSIS OF ADHESIONS N/A 03/31/2012   Procedure: LAPAROSCOPIC LYSIS OF ADHESIONS;  Surgeon: Emily Filbert, MD;  Location: [facility];  Service: Gynecology;  Laterality: N/A;  . LAPAROSCOPY N/A 03/31/2012   Procedure: LAPAROSCOPY OPERATIVE;  Surgeon: Emily Filbert, MD;  Location: [facility];  Service: Gynecology;  Laterality: N/A;  . NOVASURE ABLATION N/A 03/31/2012   Procedure: NOVASURE ABLATION;  Surgeon: Emily Filbert, MD;  Location: [facility];  Service: Gynecology;  Laterality: N/A;  . ROBOTIC ASSISTED  TOTAL HYSTERECTOMY N/A 04/16/2013   Procedure: ROBOTIC ASSISTED TOTAL HYSTERECTOMY;  Surgeon: Lavonia Drafts, MD;  Location: [facility];  Service: Gynecology;  Laterality: N/A;  . TUBAL LIGATION  2002  . WISDOM TOOTH EXTRACTION      Current Outpatient Medications on File Prior to Visit  Medication Sig Dispense Refill  . azelastine (ASTELIN) 0.1 % nasal spray Place 1 spray into both nostrils 2 (two) times daily. Use in each nostril as directed 30 mL 0   No current facility-administered medications on file prior to visit.     Allergies  Allergen Reactions  . Bee Venom Anaphylaxis  . Bactrim [Sulfamethoxazole-Trimethoprim] Itching    Social History   Socioeconomic History  . Marital status: Single    Spouse name: Not on file  . Number of children: Not on file  . Years of education: Not on file  . Highest education level: Not on file  Occupational History  . Not on file  Social Needs  . Financial resource strain: Not on file  . Food insecurity    Worry: Not on file    Inability: Not on file  . Transportation needs    Medical: Not on file    Non-medical: Not on file  Tobacco Use  . Smoking status: Former Smoker    Packs/day: 0.00  Types: Cigarettes    Quit date: 01/21/2011    Years since quitting: 7.7  . Smokeless tobacco: Never Used  Substance and Sexual Activity  . Alcohol use: No    Comment: social occasions  . Drug use: No  . Sexual activity: Not Currently    Birth control/protection: Condom, Surgical  Lifestyle  . Physical activity    Days per week: Not on file    Minutes per session: Not on file  . Stress: Not on file  Relationships  . Social Herbalist on phone: Not on file    Gets together: Not on file    Attends religious service: Not on file    Active member of club or organization: Not on file    Attends meetings of clubs or organizations: Not on file    Relationship status: Not on file  . Intimate partner violence    Fear of current  or ex partner: Not on file    Emotionally abused: Not on file    Physically abused: Not on file    Forced sexual activity: Not on file  Other Topics Concern  . Not on file  Social History Narrative  . Not on file    Family History  Problem Relation Age of Onset  . Fibroids Sister     The following portions of the patient's history were reviewed and updated as appropriate: allergies, current medications, past family history, past medical history, past social history, past surgical history and problem list.  Review of Systems Pertinent items are noted in HPI.   Objective:  BP (!) 140/98   Pulse 85   Temp 98.2 F (36.8 C)   LMP 03/31/2013 (Exact Date)  Wt Readings from Last 3 Encounters:  05/16/18 220 lb (99.8 kg)  12/01/17 227 lb 12.8 oz (103.3 kg)  10/13/17 235 lb (106.6 kg)     CONSTITUTIONAL: Well-developed, well-nourished female in no acute distress.  HENT:  Normocephalic, atraumatic, External right and left ear normal. Oropharynx is clear and moist EYES: Conjunctivae and EOM are normal. Pupils are equal, round, and reactive to light. No scleral icterus.  NECK: Normal range of motion, supple, no masses.  Normal thyroid.   CARDIOVASCULAR: Normal heart rate noted, regular rhythm RESPIRATORY: Clear to auscultation bilaterally. Effort and breath sounds normal, no problems with respiration noted. BREASTS: Symmetric in size. No masses, skin changes, nipple drainage, or lymphadenopathy. ABDOMEN: Soft, normal bowel sounds, no distention noted.  No tenderness, rebound or guarding.  PELVIC: Normal appearing external genitalia; normal appearing vaginal mucosa and cervix.  Slight thick white discharge noted. Uterus and cervix abscent MUSCULOSKELETAL: Normal range of motion. No tenderness.  No cyanosis, clubbing, or edema.  2+ distal pulses. SKIN: Skin is warm and dry. No rash noted. Not diaphoretic. No erythema. No pallor. NEUROLOGIC: Alert and oriented to person, place, and time.  Normal reflexes, muscle tone coordination. No cranial nerve deficit noted. PSYCHIATRIC: Normal mood and affect. Normal behavior. Normal judgment and thought content.  Assessment:  Annual gynecologic examination with pap smear   Plan:  1. Well Woman Exam PAP not done as cervix surgically absent and PAP of vaginal cuff not indicated Mammogram scheduled STD testing discussed. Patient declined testing  2. Essential hypertension Referral to family medicine   Routine preventative health maintenance measures emphasized. Please refer to After Visit Summary for other counseling recommendations.    Loma Boston, [place] for [employer]

## 2018-11-02 ENCOUNTER — Encounter (HOSPITAL_COMMUNITY): Payer: Self-pay

## 2018-11-02 ENCOUNTER — Other Ambulatory Visit: Payer: Self-pay

## 2018-11-02 ENCOUNTER — Ambulatory Visit (HOSPITAL_COMMUNITY)
Admission: EM | Admit: 2018-11-02 | Discharge: 2018-11-02 | Disposition: A | Discharge status: Home / Self Care | Payer: Medicaid Other | Attending: Emergency Medicine | Admitting: Emergency Medicine

## 2018-11-02 DIAGNOSIS — R519 Headache, unspecified: Secondary | ICD-10-CM | POA: Diagnosis not present

## 2018-11-02 DIAGNOSIS — R030 Elevated blood-pressure reading, without diagnosis of hypertension: Secondary | ICD-10-CM

## 2018-11-02 DIAGNOSIS — R202 Paresthesia of skin: Secondary | ICD-10-CM | POA: Diagnosis not present

## 2018-11-02 DIAGNOSIS — M25512 Pain in left shoulder: Secondary | ICD-10-CM | POA: Diagnosis not present

## 2018-11-02 MED ORDER — IBUPROFEN 600 MG PO TABS: 600.0000 mg | ORAL_TABLET | Freq: Four times a day (QID) | ORAL | 0 refills | Status: DC | PRN

## 2018-11-02 MED ORDER — CYCLOBENZAPRINE HCL 10 MG PO TABS: 10.0000 mg | ORAL_TABLET | Freq: Two times a day (BID) | ORAL | 0 refills | Status: DC | PRN

## 2018-11-02 NOTE — ED Triage Notes (Signed)
Pt presents with ongoing headache, right hand tingling, and left shoulder pain X 1 week.  Pt also has complaints of elevated blood pressure for 2 weeks; pt is not currently on any medication for it.

## 2018-11-02 NOTE — Discharge Instructions (Addendum)
Take the prescribed ibuprofen as needed for your pain.  Take the muscle relaxer Flexeril as needed for muscle spasm; do not drive, operate machinery, or drink alcohol with this medication as it may make you drowsy.    Follow up with an orthopedist for evaluation of the tingling in your right hand.    Your blood pressure is elevated today at 153/77.  Please have this rechecked by your primary care provider in 2 weeks.  If you do not have a primary care provider, one is suggested below.    Keep a log of blood pressures once or twice a day to show your primary care provider.    Go to the emergency department if you develop worsening symptoms or new symptoms such as dizziness, vision changes, weakness, chest pain, shortness of breath, or other concerns.

## 2018-11-02 NOTE — ED Provider Notes (Signed)
[facility]    CSN: [id number] Arrival date & time: 11/02/18  1611      History   Chief Complaint Chief Complaint  Patient presents with  . Headache  . Hand Numbness    Right  . Shoulder Pain    Left    HPI [name] is a 48 y.o. female.   Patient presents with 1 week history of left-sided headache, pain radiating down her left arm from her left upper back, and right hand tingling.  Patient is also concerned about her blood pressure being elevated.  She is tearful during the exam, stating that she learned today that a friend had died.  She is also feeling stressed about other life circumstances.  She denies dizziness, vision changes, weakness, chest pain, shortness of breath, or other symptoms.  No falls or injury.  Patient has treated her symptoms with Tylenol which provides temporary relief.  LMP: hysterectomy.    The history is provided by the patient.    Past Medical History:  Diagnosis Date  . Anemia   . Fibroids, submucosal   . History of blood transfusion 02/2011   [facility] - ? 2-3 units transfused    Patient Active Problem List   Diagnosis Date Noted  . Postoperative state 04/16/2013  . Hematometra 01/09/2013  . Menorrhagia 01/09/2013  . Chronic pelvic pain in female 01/09/2013    Past Surgical History:  Procedure Laterality Date  . APPENDECTOMY    . BILATERAL SALPINGECTOMY Bilateral 04/16/2013   Procedure: BILATERAL SALPINGECTOMY;  Surgeon: Lavonia Drafts, MD;  Location: [facility];  Service: Gynecology;  Laterality: Bilateral;  . [place], 2002  . DILATION AND CURETTAGE OF UTERUS N/A 03/31/2012   Procedure: DILATATION AND CURETTAGE;  Surgeon: Emily Filbert, MD;  Location: [facility];  Service: Gynecology;  Laterality: N/A;  . LAPAROSCOPIC LYSIS OF ADHESIONS N/A 03/31/2012   Procedure: LAPAROSCOPIC LYSIS OF ADHESIONS;  Surgeon: Emily Filbert, MD;  Location: [facility];  Service: Gynecology;  Laterality: N/A;  . LAPAROSCOPY N/A 03/31/2012   Procedure: LAPAROSCOPY OPERATIVE;  Surgeon: Emily Filbert, MD;  Location: [facility];  Service: Gynecology;  Laterality: N/A;  . NOVASURE ABLATION N/A 03/31/2012   Procedure: NOVASURE ABLATION;  Surgeon: Emily Filbert, MD;  Location: [facility];  Service: Gynecology;  Laterality: N/A;  . ROBOTIC ASSISTED TOTAL HYSTERECTOMY N/A 04/16/2013   Procedure: ROBOTIC ASSISTED TOTAL HYSTERECTOMY;  Surgeon: Lavonia Drafts, MD;  Location: [facility];  Service: Gynecology;  Laterality: N/A;  . TUBAL LIGATION  2002  . WISDOM TOOTH EXTRACTION      OB History    Gravida  3   Para  2   Term  2   Preterm      AB  1   Living  2     SAB  1   TAB      Ectopic      Multiple      Live Births               Home Medications    Prior to Admission medications   Medication Sig Start Date End Date Taking? Authorizing Provider  azelastine (ASTELIN) 0.1 % nasal spray Place 1 spray into both nostrils 2 (two) times daily. Use in each nostril as directed 12/01/17   Shella Maxim, NP  cyclobenzaprine (FLEXERIL) 10 MG tablet Take 1 tablet (10 mg total) by mouth 2 (two) times daily as needed for muscle spasms. 11/02/18   Barkley Boards  H, NP  ibuprofen (ADVIL) 600 MG tablet Take 1 tablet (600 mg total) by mouth every 6 (six) hours as needed. 11/02/18   Sharion Balloon, NP    Family History Family History  Problem Relation Age of Onset  . Fibroids Sister     Social History Social History   Tobacco Use  . Smoking status: Former Smoker    Packs/day: 0.00    Types: Cigarettes    Quit date: 01/21/2011    Years since quitting: 7.7  . Smokeless tobacco: Never Used  Substance Use Topics  . Alcohol use: No    Comment: social occasions  . Drug use: No     Allergies   Bee venom and Bactrim [sulfamethoxazole-trimethoprim]   Review of Systems Review of Systems  Constitutional: Negative for chills and fever.  HENT: Negative for ear pain and sore throat.   Eyes: Negative for pain and visual disturbance.   Respiratory: Negative for cough and shortness of breath.   Cardiovascular: Negative for chest pain and palpitations.  Gastrointestinal: Negative for abdominal pain and vomiting.  Genitourinary: Negative for dysuria and hematuria.  Musculoskeletal: Positive for arthralgias. Negative for back pain.  Skin: Negative for color change and rash.  Neurological: Positive for headaches. Negative for dizziness, tremors, seizures, syncope, facial asymmetry, speech difficulty, weakness, light-headedness and numbness.  Psychiatric/Behavioral: The patient is nervous/anxious.   All other systems reviewed and are negative.    Physical Exam Triage Vital Signs ED Triage Vitals  Enc Vitals Group     BP      Pulse      Resp      Temp      Temp src      SpO2      Weight      Height      Head Circumference      Peak Flow      Pain Score      Pain Loc      Pain Edu?      Excl. in [place]?    No data found.  Updated Vital Signs BP (!) 153/77 (BP Location: Right Arm)   Pulse 77   Temp 98.4 F (36.9 C) (Oral)   Resp 17   LMP 03/31/2013 (Exact Date)   SpO2 100%   Visual Acuity Right Eye Distance:   Left Eye Distance:   Bilateral Distance:    Right Eye Near:   Left Eye Near:    Bilateral Near:     Physical Exam Vitals signs and nursing note reviewed.  Constitutional:      General: She is not in acute distress.    Appearance: She is well-developed. She is not ill-appearing.  HENT:     Head: Normocephalic and atraumatic.     Right Ear: Tympanic membrane normal.     Left Ear: Tympanic membrane normal.     Nose: Nose normal.     Mouth/Throat:     Mouth: Mucous membranes are moist.     Pharynx: Oropharynx is clear.  Eyes:     Extraocular Movements: Extraocular movements intact.     Conjunctiva/sclera: Conjunctivae normal.     Pupils: Pupils are equal, round, and reactive to light.  Neck:     Musculoskeletal: Neck supple.  Cardiovascular:     Rate and Rhythm: Normal rate and regular  rhythm.     Heart sounds: No murmur.  Pulmonary:     Effort: Pulmonary effort is normal. No respiratory distress.     Breath  sounds: Normal breath sounds.  Abdominal:     General: Bowel sounds are normal.     Palpations: Abdomen is soft.     Tenderness: There is no abdominal tenderness. There is no guarding or rebound.  Musculoskeletal: Normal range of motion.        General: No swelling, tenderness, deformity or signs of injury.  Skin:    General: Skin is warm and dry.     Capillary Refill: Capillary refill takes less than 2 seconds.     Findings: No bruising, erythema, lesion or rash.  Neurological:     General: No focal deficit present.     Mental Status: She is alert and oriented to person, place, and time.     Cranial Nerves: No cranial nerve deficit.     Sensory: No sensory deficit.     Motor: No weakness.     Coordination: Coordination normal.     Gait: Gait normal.     Deep Tendon Reflexes: Reflexes normal.      UC Treatments / Results  Labs (all labs ordered are listed, but only abnormal results are displayed) Labs Reviewed - No data to display  EKG   Radiology No results found.  Procedures Procedures (including critical care time)  Medications Ordered in UC Medications - No data to display  Initial Impression / Assessment and Plan / UC Course  I have reviewed the triage vital signs and the nursing notes.  Pertinent labs & imaging results that were available during my care of the patient were reviewed by me and considered in my medical decision making (see chart for details).   Acute left shoulder pain.  Paresthesias in right hand.  Acute non-intractable headache.  Elevated blood pressure without known hypertension.  EKG shows normal sinus rhythm with a rate of 69, no ST elevation, comparison to EKG from April 2020.  Treating patient symptoms with ibuprofen and Flexeril.  Instructed her to follow-up with an orthopedist for the tingling in her right hand.   Discussed with her that her blood pressure is elevated and she needs to establish a PCP to have this rechecked in the next 2 to 4 weeks; suggestion for PCP given.  Instructed patient to keep a log of her blood pressures once or twice a day.  Instructed her to go to the emergency department if she has worsening symptoms or develops new symptoms such as dizziness, vision changes, weakness, chest pain, shortness of breath, or other concerns.  Patient agrees to plan of care.   Final Clinical Impressions(s) / UC Diagnoses   Final diagnoses:  Acute pain of left shoulder  Paresthesias in right hand  Acute nonintractable headache, unspecified headache type  Elevated blood-pressure reading without diagnosis of hypertension     Discharge Instructions     Take the prescribed ibuprofen as needed for your pain.  Take the muscle relaxer Flexeril as needed for muscle spasm; do not drive, operate machinery, or drink alcohol with this medication as it may make you drowsy.    Follow up with an orthopedist for evaluation of the tingling in your right hand.    Your blood pressure is elevated today at 153/77.  Please have this rechecked by your primary care provider in 2 weeks.  If you do not have a primary care provider, one is suggested below.    Keep a log of blood pressures once or twice a day to show your primary care provider.    Go to the emergency department if  you develop worsening symptoms or new symptoms such as dizziness, vision changes, weakness, chest pain, shortness of breath, or other concerns.           ED Prescriptions    Medication Sig Dispense Auth. Provider   cyclobenzaprine (FLEXERIL) 10 MG tablet Take 1 tablet (10 mg total) by mouth 2 (two) times daily as needed for muscle spasms. 20 tablet Sharion Balloon, NP   ibuprofen (ADVIL) 600 MG tablet Take 1 tablet (600 mg total) by mouth every 6 (six) hours as needed. 30 tablet Sharion Balloon, NP     I have reviewed the PDMP during this  encounter.   Sharion Balloon, NP 11/02/18 [phone]

## 2018-11-07 ENCOUNTER — Other Ambulatory Visit: Payer: Self-pay

## 2018-11-07 ENCOUNTER — Ambulatory Visit: Payer: Medicaid Other | Admitting: Internal Medicine

## 2018-11-07 VITALS — BP 130/91 | HR 75 | Temp 98.4°F | Ht 65.0 in | Wt 231.3 lb

## 2018-11-07 DIAGNOSIS — Z79899 Other long term (current) drug therapy: Secondary | ICD-10-CM

## 2018-11-07 DIAGNOSIS — D649 Anemia, unspecified: Secondary | ICD-10-CM | POA: Diagnosis not present

## 2018-11-07 DIAGNOSIS — Z0000 Encounter for general adult medical examination without abnormal findings: Secondary | ICD-10-CM | POA: Diagnosis not present

## 2018-11-07 DIAGNOSIS — R030 Elevated blood-pressure reading, without diagnosis of hypertension: Secondary | ICD-10-CM | POA: Diagnosis present

## 2018-11-07 DIAGNOSIS — Z114 Encounter for screening for human immunodeficiency virus [HIV]: Secondary | ICD-10-CM | POA: Diagnosis present

## 2018-11-07 DIAGNOSIS — Z23 Encounter for immunization: Secondary | ICD-10-CM | POA: Diagnosis not present

## 2018-11-07 DIAGNOSIS — Z87891 Personal history of nicotine dependence: Secondary | ICD-10-CM

## 2018-11-07 DIAGNOSIS — Z131 Encounter for screening for diabetes mellitus: Secondary | ICD-10-CM | POA: Diagnosis not present

## 2018-11-07 DIAGNOSIS — D509 Iron deficiency anemia, unspecified: Secondary | ICD-10-CM | POA: Diagnosis not present

## 2018-11-07 LAB — GLUCOSE, CAPILLARY: Glucose-Capillary: 80 mg/dL (ref 70–99)

## 2018-11-07 LAB — POCT GLYCOSYLATED HEMOGLOBIN (HGB A1C): Hemoglobin A1C: 5.3 % (ref 4.0–5.6)

## 2018-11-07 NOTE — Patient Instructions (Addendum)
Thank you for allowing [place] to care for you, welcome to our clinic!  For your elevated blood pressure - We will recheck this at follow up - Work on diet and exercise for now  We will get labs to screen for HIV, Diabetes, and Iron levels  Flu shot given today  Please follow up in about 3 months

## 2018-11-07 NOTE — Assessment & Plan Note (Signed)
BP elevated today on first check to 147/81, this improved to 130/91 on recheck. Will have patient implement lifestyle changes and recheck at follow up. She states she would like to loose about 30 lbs because she wasn't to be here for her children and remain healthy. - Encouraged diet and exercise - Follow up with new PCP in about 3 months

## 2018-11-07 NOTE — Assessment & Plan Note (Addendum)
Patient has a history of anemia, previously due to menorrhagia. She is now s/p total hysterectomy, but has mild anemia with borderline low MCV on recent labs. Will screen for Iron deficiency with lab work today - Iron, TIBC, Ferritin  ADDENDUM: Iron and Ferritin low, patient will be contacted and informed to start iron supplementation. Iron/TIBC/Ferritin/ %Sat    Component Value Date/Time   IRON 32 11/07/2018 1516   TIBC 376 11/07/2018 1516   FERRITIN 9 (L) 11/07/2018 1516   IRONPCTSAT 9 (LL) 11/07/2018 1516  - Ferrous Sulfate 325mg  every other day

## 2018-11-07 NOTE — Assessment & Plan Note (Addendum)
-   Flu Shot Given - HIV screen ordered - Screening A1c is WNL  - She has deferred TDAP to follow up

## 2018-11-07 NOTE — Progress Notes (Signed)
CC: Elevated blood pressure, Anemia, Establish care, Preventative healthcare   HPI:   [name] is a 48 y.o. F with PMHx listed below presenting for Elevated blood pressure, Anemia, Establish care, Preventative healthcare. Please see the A&P for the status of the patient's chronic medical problems.  Past Medical History:  Diagnosis Date  . Anemia   . Fibroids, submucosal   . History of blood transfusion 02/2011   [facility] - ? 2-3 units transfused   Past Surgical History:  Procedure Laterality Date  . APPENDECTOMY    . BILATERAL SALPINGECTOMY Bilateral 04/16/2013   Procedure: BILATERAL SALPINGECTOMY;  Surgeon: Lavonia Drafts, MD;  Location: [facility];  Service: Gynecology;  Laterality: Bilateral;  . [place], 2002  . DILATION AND CURETTAGE OF UTERUS N/A 03/31/2012   Procedure: DILATATION AND CURETTAGE;  Surgeon: Emily Filbert, MD;  Location: [facility];  Service: Gynecology;  Laterality: N/A;  . LAPAROSCOPIC LYSIS OF ADHESIONS N/A 03/31/2012   Procedure: LAPAROSCOPIC LYSIS OF ADHESIONS;  Surgeon: Emily Filbert, MD;  Location: [facility];  Service: Gynecology;  Laterality: N/A;  . LAPAROSCOPY N/A 03/31/2012   Procedure: LAPAROSCOPY OPERATIVE;  Surgeon: Emily Filbert, MD;  Location: [facility];  Service: Gynecology;  Laterality: N/A;  . NOVASURE ABLATION N/A 03/31/2012   Procedure: NOVASURE ABLATION;  Surgeon: Emily Filbert, MD;  Location: [facility];  Service: Gynecology;  Laterality: N/A;  . ROBOTIC ASSISTED TOTAL HYSTERECTOMY N/A 04/16/2013   Procedure: ROBOTIC ASSISTED TOTAL HYSTERECTOMY;  Surgeon: Lavonia Drafts, MD;  Location: [facility];  Service: Gynecology;  Laterality: N/A;  . TUBAL LIGATION  2002  . WISDOM TOOTH EXTRACTION     Family History  Problem Relation Age of Onset  . Fibroids Sister    Social History   Socioeconomic History  . Marital status: Single    Spouse name: Not on file  . Number of children: Not on file  . Years of education: Not on file  . Highest  education level: Not on file  Occupational History  . Not on file  Social Needs  . Financial resource strain: Not on file  . Food insecurity    Worry: Not on file    Inability: Not on file  . Transportation needs    Medical: Not on file    Non-medical: Not on file  Tobacco Use  . Smoking status: Former Smoker    Packs/day: 0.00    Types: Cigarettes    Quit date: 01/21/2011    Years since quitting: 7.8  . Smokeless tobacco: Never Used  Substance and Sexual Activity  . Alcohol use: No    Comment: social occasions  . Drug use: No  . Sexual activity: Not Currently    Birth control/protection: Condom, Surgical  Lifestyle  . Physical activity    Days per week: Not on file    Minutes per session: Not on file  . Stress: Not on file  Relationships  . Social Herbalist on phone: Not on file    Gets together: Not on file    Attends religious service: Not on file    Active member of club or organization: Not on file    Attends meetings of clubs or organizations: Not on file    Relationship status: Not on file  . Intimate partner violence    Fear of current or ex partner: Not on file    Emotionally abused: Not on file    Physically abused: Not on  file    Forced sexual activity: Not on file  Other Topics Concern  . Not on file  Social History Narrative  . Not on file   Review of Systems:  Performed and all others negative.  Physical Exam:   Vitals:   11/07/18 1429 11/07/18 1434  BP: (!) 147/81 (!) 130/91  Pulse: 72 75  Temp: 98.4 F (36.9 C)   TempSrc: Oral   SpO2: 100%   Weight: 231 lb 4.8 oz (104.9 kg)   Height: 5\' 5"  (1.651 m)    Physical Exam Constitutional:      General: She is not in acute distress.    Appearance: Normal appearance. She is obese.  Cardiovascular:     Rate and Rhythm: Normal rate and regular rhythm.     Pulses: Normal pulses.     Heart sounds: Normal heart sounds.  Pulmonary:     Effort: Pulmonary effort is normal. No respiratory  distress.     Breath sounds: Normal breath sounds.  Abdominal:     General: Bowel sounds are normal. There is no distension.     Palpations: Abdomen is soft.     Tenderness: There is no abdominal tenderness.  Musculoskeletal:        General: No swelling or deformity.  Skin:    General: Skin is warm and dry.  Neurological:     General: No focal deficit present.     Mental Status: Mental status is at baseline.    Assessment & Plan:   See Encounters Tab for problem based charting.  Patient discussed with Dr. Lynnae January

## 2018-11-08 ENCOUNTER — Telehealth: Payer: Self-pay | Admitting: Internal Medicine

## 2018-11-08 LAB — IRON,TIBC AND FERRITIN PANEL
Ferritin: 9 ng/mL — ABNORMAL LOW (ref 15–150)
Iron Saturation: 9 % — CL (ref 15–55)
Iron: 32 ug/dL (ref 27–159)
Total Iron Binding Capacity: 376 ug/dL (ref 250–450)
UIBC: 344 ug/dL (ref 131–425)

## 2018-11-08 LAB — HIV ANTIBODY (ROUTINE TESTING W REFLEX): HIV Screen 4th Generation wRfx: NONREACTIVE

## 2018-11-08 MED ORDER — FERROUS SULFATE 325 (65 FE) MG PO TABS: 325.0000 mg | ORAL_TABLET | ORAL | 2 refills | Status: DC

## 2018-11-08 NOTE — Telephone Encounter (Addendum)
Patient called to inform her of her negative HIV screen and low iron and ferritin. She will need to start taking iron every other day. Taking every other day reduces risk of constipation and can improve absorption.  No answer on first try. Patient called back and we discussed results, she agree with the plan.

## 2018-11-08 NOTE — Addendum Note (Signed)
Addended by: Neva Seat B on: 11/08/2018 10:25 AM   Modules accepted: Orders

## 2018-11-10 ENCOUNTER — Encounter (HOSPITAL_COMMUNITY): Payer: Self-pay | Admitting: Emergency Medicine

## 2018-11-10 ENCOUNTER — Emergency Department (HOSPITAL_COMMUNITY)
Admission: EM | Admit: 2018-11-10 | Discharge: 2018-11-11 | Disposition: A | Discharge status: Home / Self Care | Payer: Medicaid Other | Attending: Emergency Medicine | Admitting: Emergency Medicine

## 2018-11-10 ENCOUNTER — Other Ambulatory Visit: Payer: Self-pay

## 2018-11-10 DIAGNOSIS — Z20828 Contact with and (suspected) exposure to other viral communicable diseases: Secondary | ICD-10-CM | POA: Diagnosis not present

## 2018-11-10 DIAGNOSIS — R0789 Other chest pain: Secondary | ICD-10-CM | POA: Insufficient documentation

## 2018-11-10 DIAGNOSIS — R059 Cough, unspecified: Secondary | ICD-10-CM

## 2018-11-10 DIAGNOSIS — R079 Chest pain, unspecified: Secondary | ICD-10-CM | POA: Diagnosis not present

## 2018-11-10 DIAGNOSIS — R05 Cough: Secondary | ICD-10-CM | POA: Insufficient documentation

## 2018-11-10 DIAGNOSIS — Z79899 Other long term (current) drug therapy: Secondary | ICD-10-CM | POA: Insufficient documentation

## 2018-11-10 DIAGNOSIS — Z87891 Personal history of nicotine dependence: Secondary | ICD-10-CM | POA: Insufficient documentation

## 2018-11-10 MED ORDER — SODIUM CHLORIDE 0.9% FLUSH: 3.0000 mL | Freq: Once | INTRAVENOUS | Status: DC

## 2018-11-10 NOTE — Progress Notes (Signed)
Internal Medicine Clinic Attending  Case discussed with Dr. Melvin  at the time of the visit.  We reviewed the resident's history and exam and pertinent patient test results.  I agree with the assessment, diagnosis, and plan of care documented in the resident's note.  

## 2018-11-10 NOTE — ED Triage Notes (Signed)
Pt. States has chest pain that started about 2 hours ago (2140). Pain is in center of chest and radiates intermittently to right and left sides. Pt. States feels a little dizzy.

## 2018-11-11 ENCOUNTER — Emergency Department (HOSPITAL_COMMUNITY): Payer: Medicaid Other

## 2018-11-11 DIAGNOSIS — R079 Chest pain, unspecified: Secondary | ICD-10-CM | POA: Diagnosis not present

## 2018-11-11 LAB — I-STAT BETA HCG BLOOD, ED (MC, WL, AP ONLY): I-stat hCG, quantitative: <5 m[IU]/mL (ref <5)

## 2018-11-11 LAB — CBC
HCT: 34.3 % — ABNORMAL LOW (ref 36.0–46.0)
Hemoglobin: 11.3 g/dL — ABNORMAL LOW (ref 12.0–15.0)
MCH: 27.9 pg (ref 26.0–34.0)
MCHC: 32.9 g/dL (ref 30.0–36.0)
MCV: 84.7 fL (ref 80.0–100.0)
Platelets: 261 10*3/uL (ref 150–400)
RBC: 4.05 MIL/uL (ref 3.87–5.11)
RDW: 14.7 % (ref 11.5–15.5)
WBC: 6.1 10*3/uL (ref 4.0–10.5)
nRBC: 0 % (ref 0.0–0.2)

## 2018-11-11 LAB — BASIC METABOLIC PANEL
Anion gap: 10 (ref 5–15)
BUN: 18 mg/dL (ref 6–20)
CO2: 23 mmol/L (ref 22–32)
Calcium: 9 mg/dL (ref 8.9–10.3)
Chloride: 105 mmol/L (ref 98–111)
Creatinine, Ser: 0.7 mg/dL (ref 0.44–1.00)
GFR calc Af Amer: >60 mL/min (ref >60)
GFR calc non Af Amer: >60 mL/min (ref >60)
Glucose, Bld: 104 mg/dL — ABNORMAL HIGH (ref 70–99)
Potassium: 3.7 mmol/L (ref 3.5–5.1)
Sodium: 138 mmol/L (ref 135–145)

## 2018-11-11 LAB — SARS CORONAVIRUS 2 (TAT 6-24 HRS): SARS Coronavirus 2: NEGATIVE

## 2018-11-11 LAB — TROPONIN I (HIGH SENSITIVITY)
Troponin I (High Sensitivity): 2 ng/L (ref <18)
Troponin I (High Sensitivity): <2 ng/L (ref <18)

## 2018-11-11 MED ORDER — BENZONATATE 100 MG PO CAPS: 100.0000 mg | ORAL_CAPSULE | Freq: Three times a day (TID) | ORAL | 0 refills | Status: DC | PRN

## 2018-11-11 NOTE — ED Provider Notes (Signed)
TIME SEEN: 5:16 AM  CHIEF COMPLAINT: Chest pain, cough  HPI: Patient is a 48 year old female with history of anemia who presents to the emergency department with central chest pain and cough.  Got flu shot Tuesday 10/20.  Started coughing Thursday 10/22.    Chest pain with coughing.  Felt nagging and achy and started Friday 10/23.  No fever or chills.  No SOB.  No N, V, D.    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  Exposed to multiple people but no known Covid exposures.  No history of PE, DVT, exogenous estrogen use, recent fractures, surgery, trauma, hospitalization or prolonged travel. No lower extremity swelling or pain. No calf tenderness.  No history of CAD.  No family history of premature CAD.  ROS: See HPI Constitutional: no fever  Eyes: no drainage  ENT: no runny nose   Cardiovascular:   chest pain  Resp: no SOB  GI: no vomiting GU: no dysuria Integumentary: no rash  Allergy: no hives  Musculoskeletal: no leg swelling  Neurological: no slurred speech ROS otherwise negative  PAST MEDICAL HISTORY/PAST SURGICAL HISTORY:  Past Medical History:  Diagnosis Date  . Anemia   . Fibroids, submucosal   . History of blood transfusion 02/2011   [facility] - ? 2-3 units transfused    MEDICATIONS:  Prior to Admission medications   Medication Sig Start Date End Date Taking? Authorizing Provider  azelastine (ASTELIN) 0.1 % nasal spray Place 1 spray into both nostrils 2 (two) times daily. Use in each nostril as directed 12/01/17   Shella Maxim, NP  cyclobenzaprine (FLEXERIL) 10 MG tablet Take 1 tablet (10 mg total) by mouth 2 (two) times daily as needed for muscle spasms. 11/02/18   Sharion Balloon, NP  ferrous sulfate 325 (65 FE) MG tablet Take 1 tablet (325 mg total) by mouth every other day. 11/08/18 02/06/19  Neva Seat, MD  ibuprofen (ADVIL) 600 MG tablet Take 1 tablet (600 mg total) by mouth every 6 (six) hours as needed. 11/02/18   Sharion Balloon, NP    ALLERGIES:  Allergies   Allergen Reactions  . Bee Venom Anaphylaxis  . Bactrim [Sulfamethoxazole-Trimethoprim] Itching    SOCIAL HISTORY:  Social History   Tobacco Use  . Smoking status: Former Smoker    Packs/day: 0.00    Types: Cigarettes    Quit date: 01/21/2011    Years since quitting: 7.8  . Smokeless tobacco: Never Used  Substance Use Topics  . Alcohol use: No    Comment: social occasions    FAMILY HISTORY: Family History  Problem Relation Age of Onset  . Hypertension Mother   . Seizures Father   . Fibroids Sister     EXAM: BP 130/75 (BP Location: Right Arm)   Pulse 68   Temp 98.2 F (36.8 C) (Oral)   Resp 19   Ht 5\' 5"  (1.651 m)   Wt 104.3 kg   LMP 03/31/2013 (Exact Date)   SpO2 100%   BMI 38.27 kg/m  CONSTITUTIONAL: Alert and oriented and responds appropriately to questions. Well-appearing; well-nourished HEAD: Normocephalic EYES: Conjunctivae clear, pupils appear equal, EOMI ENT: normal nose; moist mucous membranes NECK: Supple, no meningismus, no nuchal rigidity, no LAD  CARD: RRR; S1 and S2 appreciated; no murmurs, no clicks, no rubs, no gallops RESP: Normal chest excursion without splinting or tachypnea; breath sounds clear and equal bilaterally; no wheezes, no rhonchi, no rales, no hypoxia or respiratory distress, speaking full sentences ABD/GI: Normal bowel sounds;  non-distended; soft, non-tender, no rebound, no guarding, no peritoneal signs, no hepatosplenomegaly BACK:  The back appears normal and is non-tender to palpation, there is no CVA tenderness EXT: Normal ROM in all joints; non-tender to palpation; no edema; normal capillary refill; no cyanosis, no calf tenderness or swelling    SKIN: Normal color for age and race; warm; no rash NEURO: Moves all extremities equally PSYCH: The patient's mood and manner are appropriate. Grooming and personal hygiene are appropriate.  MEDICAL DECISION MAKING: Patient here with atypical chest pain.  Patient seems mostly concerned  about her nonproductive cough.  Cardiac work-up here is unremarkable.  Doubt ACS, PE or dissection.  She is PERC negative.  EKG shows no ischemic abnormality.  Have sent a Covid swab and she can follow-up on these results in my chart.  No sign of bacterial pneumonia.  I do not feel she needs antibiotics.  She is nontoxic and has no signs of sepsis.  Will discharge with Ladona Ridgel, outpatient follow-up, work note and return precautions.  At this time, I do not feel there is any life-threatening condition present. I have reviewed and discussed all results (EKG, imaging, lab, urine as appropriate) and exam findings with patient/family. I have reviewed nursing notes and appropriate previous records.  I feel the patient is safe to be discharged home without further emergent workup and can continue workup as an outpatient as needed. Discussed usual and customary return precautions. Patient/family verbalize understanding and are comfortable with this plan.  Outpatient follow-up has been provided as needed. All questions have been answered.    Date: 11/10/2018 23:42  Rate: 71  Rhythm: normal sinus rhythm with sinus arrhythmia  QRS Axis: normal  Intervals: normal  ST/T Wave abnormalities: normal  Conduction Disutrbances: none  Narrative Interpretation: Normal sinus rhythm with sinus arrhythmia, no significant change compared to previous, no ischemia or interval change       Heydi R Howze was evaluated in Emergency Department on 11/11/2018 for the symptoms described in the history of present illness. She was evaluated in the context of the global COVID-19 pandemic, which necessitated consideration that the patient might be at risk for infection with the SARS-CoV-2 virus that causes COVID-19. Institutional protocols and algorithms that pertain to the evaluation of patients at risk for COVID-19 are in a state of rapid change based on information released by regulatory bodies including the CDC and  federal and state organizations. These policies and algorithms were followed during the patient's care in the ED.    Sally-Ann Cutbirth, Delice Bison, DO 11/11/18 [phone]

## 2018-11-11 NOTE — Discharge Instructions (Addendum)
You may alternate Tylenol 1000 mg every 6 hours as needed for pain and Ibuprofen 800 mg every 8 hours as needed for pain.  Please take Ibuprofen with food.  Your cardiac labs, EKG and chest x-ray were normal today.  You have no sign of bacterial pneumonia and do not currently need antibiotics.  We have swabbed you for COVID-19.  This test should result in the next 24 hours.  You can follow-up on these results through [facility].  If your test result is positive, you will need to quarantine for at least 10 days after the onset of symptoms and be fever free for 3 days before coming out of quarantine.  Any close contacts will need to be notified so that they can quarantine for 14 days after they have last seen you.

## 2018-11-30 ENCOUNTER — Ambulatory Visit
Admission: RE | Admit: 2018-11-30 | Discharge: 2018-11-30 | Disposition: A | Discharge status: Home / Self Care | Payer: Medicaid Other | Source: Ambulatory Visit | Attending: Family Medicine | Admitting: Family Medicine

## 2018-11-30 ENCOUNTER — Other Ambulatory Visit: Payer: Self-pay

## 2018-11-30 DIAGNOSIS — Z01419 Encounter for gynecological examination (general) (routine) without abnormal findings: Secondary | ICD-10-CM

## 2018-11-30 DIAGNOSIS — Z1231 Encounter for screening mammogram for malignant neoplasm of breast: Secondary | ICD-10-CM | POA: Diagnosis not present

## 2018-12-07 ENCOUNTER — Ambulatory Visit (HOSPITAL_COMMUNITY)
Admission: RE | Admit: 2018-12-07 | Discharge: 2018-12-07 | Disposition: A | Discharge status: Home / Self Care | Payer: Medicaid Other | Source: Ambulatory Visit | Attending: Internal Medicine | Admitting: Internal Medicine

## 2018-12-07 ENCOUNTER — Ambulatory Visit (INDEPENDENT_AMBULATORY_CARE_PROVIDER_SITE_OTHER): Payer: Medicaid Other | Admitting: Internal Medicine

## 2018-12-07 ENCOUNTER — Other Ambulatory Visit: Payer: Self-pay

## 2018-12-07 ENCOUNTER — Other Ambulatory Visit: Payer: Self-pay | Admitting: Internal Medicine

## 2018-12-07 VITALS — BP 135/76 | HR 72 | Temp 98.6°F | Ht 65.0 in | Wt 232.6 lb

## 2018-12-07 DIAGNOSIS — G8921 Chronic pain due to trauma: Secondary | ICD-10-CM

## 2018-12-07 DIAGNOSIS — M25512 Pain in left shoulder: Secondary | ICD-10-CM | POA: Insufficient documentation

## 2018-12-07 DIAGNOSIS — M542 Cervicalgia: Secondary | ICD-10-CM

## 2018-12-07 DIAGNOSIS — Z23 Encounter for immunization: Secondary | ICD-10-CM | POA: Diagnosis present

## 2018-12-07 DIAGNOSIS — M4802 Spinal stenosis, cervical region: Secondary | ICD-10-CM | POA: Diagnosis not present

## 2018-12-07 NOTE — Progress Notes (Signed)
Findings from Cervical Spine radiographs as below. Moderately severe disc space narrowing noted along with exit foraminal narrowing. Ordered MR cervical spine for further characterization.  FINDINGS: Frontal, lateral, open-mouth odontoid, and bilateral oblique views were obtained. There is no fracture or spondylolisthesis. Prevertebral soft tissues and predental space regions are normal. There is moderately severe disc space narrowing at C5-6 and C6-7 with moderate disc space narrowing at C4-5. There are anterior osteophytes at C4, C5, C6, and C7. There is facet hypertrophy with exit foraminal narrowing at C3-4, C4-5, C5-6, and C6-7 bilaterally. Lung apices are clear.  IMPRESSION: Multilevel osteoarthritic change.  No fracture or spondylolisthesis.  Jeanmarie Hubert, MD 12/07/2018, 1:31 PM Pager: [phone]
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
## 2018-12-07 NOTE — Patient Instructions (Addendum)
Today you were seen for shoulder pain. We have ordered an X-ray of your neck and will order an MRI if the X-ray is normal.  Please take ibuprofen or tylenol for the pain.  Thank you for allowing [place] to be part of your medical care!

## 2018-12-07 NOTE — Progress Notes (Signed)
   CC: Shoulder pain  HPI: Patient is a 48 year old female with past medical history as below who presents for shoulder pain.  [name] is a 48 y.o.   Past Medical History:  Diagnosis Date  . Anemia   . Fibroids, submucosal   . History of blood transfusion 02/2011   [facility] - ? 2-3 units transfused   Review of Systems:   Review of Systems  Constitutional: Negative for chills and fever.  HENT: Negative for congestion.   Respiratory: Negative for cough and shortness of breath.   Cardiovascular: Negative for chest pain.  Gastrointestinal: Negative for abdominal pain, constipation, diarrhea, nausea and vomiting.  Genitourinary: Negative for dysuria, frequency and urgency.  Musculoskeletal: Positive for joint pain and neck pain.  All other systems reviewed and are negative.   Physical Exam:  Vitals:   12/07/18 0834  BP: 135/76  Pulse: 72  Temp: 98.6 F (37 C)  TempSrc: Oral  SpO2: 100%  Weight: 232 lb 9.6 oz (105.5 kg)  Height: 5\' 5"  (1.651 m)   Physical Exam  Constitutional: She is well-developed, well-nourished, and in no distress.  HENT:  Head: Normocephalic and atraumatic.  Eyes: EOM are normal. Right eye exhibits no discharge. Left eye exhibits no discharge.  Neck: Normal range of motion. No tracheal deviation present.  Cardiovascular: Normal rate and regular rhythm. Exam reveals no gallop and no friction rub.  No murmur heard. Pulmonary/Chest: Effort normal and breath sounds normal. No respiratory distress. She has no wheezes. She has no rales.  Abdominal: Soft. She exhibits no distension. There is no abdominal tenderness. There is no rebound and no guarding.  Musculoskeletal: Normal range of motion.        General: No tenderness, deformity or edema.     Comments: Drop arm, empty can, and cross arm test negative. POSITIVE spurling test  Neurological: She is alert. Coordination normal.  Skin: Skin is warm and dry. No rash noted. She is not diaphoretic. No  erythema.  Psychiatric: Memory and judgment normal.     Assessment & Plan:   See Encounters Tab for problem based charting.  Patient seen and discussed with Dr. Dareen Piano

## 2018-12-08 NOTE — Progress Notes (Signed)
Internal Medicine Clinic Attending  I saw and evaluated the patient.  I personally confirmed the key portions of the history and exam documented by Dr. MacLean and I reviewed pertinent patient test results.  The assessment, diagnosis, and plan were formulated together and I agree with the documentation in the resident's note.  

## 2018-12-28 ENCOUNTER — Other Ambulatory Visit: Payer: Self-pay | Admitting: Internal Medicine

## 2018-12-28 DIAGNOSIS — M542 Cervicalgia: Secondary | ICD-10-CM

## 2018-12-28 NOTE — Progress Notes (Signed)
Following denial of MR cervical spine approval by insurance, I talked with patient on phone. Patient has continued left-sided radicular neck pain and now reports new development of left hand weakness.   Given this motor weakness, I will order a stat MR cervical spine.  Jeanmarie Hubert, MD 12/28/2018, 9:57 AM Pager: [phone]

## 2019-01-05 ENCOUNTER — Other Ambulatory Visit: Payer: Self-pay | Admitting: Internal Medicine

## 2019-01-05 ENCOUNTER — Other Ambulatory Visit (HOSPITAL_COMMUNITY): Payer: Self-pay | Admitting: Internal Medicine

## 2019-01-05 DIAGNOSIS — M542 Cervicalgia: Secondary | ICD-10-CM

## 2019-01-08 ENCOUNTER — Ambulatory Visit: Payer: Medicaid Other

## 2019-01-08 ENCOUNTER — Telehealth: Payer: Self-pay | Admitting: General Practice

## 2019-01-13 ENCOUNTER — Ambulatory Visit (HOSPITAL_COMMUNITY)
Admission: RE | Admit: 2019-01-13 | Discharge: 2019-01-13 | Disposition: A | Discharge status: Home / Self Care | Payer: Medicaid Other | Source: Ambulatory Visit | Attending: Internal Medicine | Admitting: Internal Medicine

## 2019-01-13 ENCOUNTER — Other Ambulatory Visit: Payer: Self-pay | Admitting: Internal Medicine

## 2019-01-13 DIAGNOSIS — M542 Cervicalgia: Secondary | ICD-10-CM | POA: Insufficient documentation

## 2019-01-13 DIAGNOSIS — M5412 Radiculopathy, cervical region: Secondary | ICD-10-CM

## 2019-01-13 DIAGNOSIS — G9520 Unspecified cord compression: Secondary | ICD-10-CM

## 2019-01-13 DIAGNOSIS — M5020 Other cervical disc displacement, unspecified cervical region: Secondary | ICD-10-CM

## 2019-01-13 NOTE — Progress Notes (Addendum)
Patient called and informed of results. Discussed referral to neurosurgery. Patient states she has continued pain but it is improved from before. She reports both the pain as well as hand weakness come and go.  MR Cervical Spine (01/13/2019): IMPRESSION: 1. 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 Up to moderate subsequent spinal stenosis and moderate spinal cord mass effect at multiple levels, but no associated cord signal abnormality.  2. Associated moderate or severe neural foraminal stenosis at the bilateral C5 through C7 nerve levels. Moderate bilateral C4 foraminal stenosis.  Jeanmarie Hubert, MD 01/13/2019, 2:57 PM Pager: [phone]

## 2019-01-26 DIAGNOSIS — G5600 Carpal tunnel syndrome, unspecified upper limb: Secondary | ICD-10-CM | POA: Diagnosis not present

## 2019-01-26 DIAGNOSIS — Z6839 Body mass index (BMI) 39.0-39.9, adult: Secondary | ICD-10-CM | POA: Diagnosis not present

## 2019-01-26 DIAGNOSIS — G959 Disease of spinal cord, unspecified: Secondary | ICD-10-CM | POA: Diagnosis not present

## 2019-01-26 DIAGNOSIS — M5412 Radiculopathy, cervical region: Secondary | ICD-10-CM | POA: Diagnosis not present

## 2019-01-26 DIAGNOSIS — S46812A Strain of other muscles, fascia and tendons at shoulder and upper arm level, left arm, initial encounter: Secondary | ICD-10-CM | POA: Diagnosis not present

## 2019-02-08 DIAGNOSIS — Z6841 Body Mass Index (BMI) 40.0 and over, adult: Secondary | ICD-10-CM | POA: Diagnosis not present

## 2019-02-08 DIAGNOSIS — M5412 Radiculopathy, cervical region: Secondary | ICD-10-CM | POA: Diagnosis not present

## 2019-02-08 DIAGNOSIS — M25512 Pain in left shoulder: Secondary | ICD-10-CM | POA: Diagnosis not present

## 2019-02-08 DIAGNOSIS — G5600 Carpal tunnel syndrome, unspecified upper limb: Secondary | ICD-10-CM | POA: Diagnosis not present

## 2019-02-08 DIAGNOSIS — R030 Elevated blood-pressure reading, without diagnosis of hypertension: Secondary | ICD-10-CM | POA: Diagnosis not present

## 2019-02-16 ENCOUNTER — Telehealth: Payer: Self-pay | Admitting: General Practice

## 2019-02-16 NOTE — Telephone Encounter (Signed)
Pt is having headaches, pls contact [phone]

## 2019-02-16 NOTE — Telephone Encounter (Signed)
Rtc, lm for rtc 

## 2019-02-19 ENCOUNTER — Encounter: Payer: Self-pay | Admitting: Internal Medicine

## 2019-02-19 ENCOUNTER — Ambulatory Visit: Payer: Medicaid Other | Admitting: Internal Medicine

## 2019-02-19 ENCOUNTER — Other Ambulatory Visit: Payer: Self-pay

## 2019-02-19 DIAGNOSIS — G8929 Other chronic pain: Secondary | ICD-10-CM

## 2019-02-19 DIAGNOSIS — M50221 Other cervical disc displacement at C4-C5 level: Secondary | ICD-10-CM

## 2019-02-19 DIAGNOSIS — M25512 Pain in left shoulder: Secondary | ICD-10-CM | POA: Diagnosis not present

## 2019-02-19 DIAGNOSIS — M50321 Other cervical disc degeneration at C4-C5 level: Secondary | ICD-10-CM | POA: Diagnosis not present

## 2019-02-19 DIAGNOSIS — R519 Headache, unspecified: Secondary | ICD-10-CM

## 2019-02-19 DIAGNOSIS — G43909 Migraine, unspecified, not intractable, without status migrainosus: Secondary | ICD-10-CM | POA: Insufficient documentation

## 2019-02-19 NOTE — Progress Notes (Signed)
Internal Medicine Clinic Attending  Case discussed with Dr. Agyei at the time of the visit.  We reviewed the resident's history and exam and pertinent patient test results.  I agree with the assessment, diagnosis, and plan of care documented in the resident's note.    

## 2019-02-19 NOTE — Patient Instructions (Signed)
[name],   Thanks for coming in to see [place]. Your headache could be due to what we call "Overuse Headache" from taking Excedrin and Ibuprofen. I will like for you to stay off the pain medications for about 3 days to 1 week. If symptoms do not improve, let us know.   Take Care!   Analgesic Rebound Headache An analgesic rebound headache, sometimes called a medication overuse headache, is a headache that comes after pain medicine (analgesic) taken to treat the original (primary) headache has worn off. Any type of primary headache can return as a rebound headache if a person regularly takes analgesics more than three times a week to treat it. The types of primary headaches that are commonly associated with rebound headaches include:  Migraines.  Headaches that arise from tense muscles in the head and neck area (tension headaches).  Headaches that develop and happen again (recur) on one side of the head and around the eye (cluster headaches). If rebound headaches continue, they become chronic daily headaches. What are the causes? This condition may be caused by frequent use of:  Over-the-counter medicines such as aspirin, ibuprofen, and acetaminophen.  Sinus relief medicines and other medicines that contain caffeine.  Narcotic pain medicines such as codeine and oxycodone. What are the signs or symptoms? The symptoms of a rebound headache are the same as the symptoms of the original headache. Some of the symptoms of specific types of headaches include: Migraine headache  Pulsing or throbbing pain on one or both sides of the head.  Severe pain that interferes with daily activities.  Pain that is worsened by physical activity.  Nausea, vomiting, or both.  Pain with exposure to bright light, loud noises, or strong smells.  General sensitivity to bright light, loud noises, or strong smells.  Visual changes.  Numbness of one or both arms. Tension headache  Pressure around the  head.  Dull, aching head pain.  Pain felt over the front and sides of the head.  Tenderness in the muscles of the head, neck, and shoulders. Cluster headache  Severe pain that begins in or around one eye or temple.  Redness and tearing in the eye on the same side as the pain.  Droopy or swollen eyelid.  One-sided head pain.  Nausea.  Runny nose.  Sweaty, pale facial skin.  Restlessness. How is this diagnosed? This condition is diagnosed by:  Reviewing your medical history. This includes the nature of your primary headaches.  Reviewing the types of pain medicines that you have been using to treat your headaches and how often you take them. How is this treated? This condition may be treated or managed by:  Discontinuing frequent use of the analgesic medicine. Doing this may worsen your headaches at first, but the pain should eventually become more manageable, less frequent, and less severe.  Seeing a headache specialist. He or she may be able to help you manage your headaches and help make sure there is not another cause of the headaches.  Using methods of stress relief, such as acupuncture, counseling, biofeedback, and massage. Talk with your health care provider about which methods might be good for you. Follow these instructions at home:  Take over-the-counter and prescription medicines only as told by your health care provider.  Stop the repeated use of pain medicine as told by your health care provider. Stopping can be difficult. Carefully follow instructions from your health care provider.  Avoid triggers that are known to cause your primary headaches.  Keep all follow-up visits as told by your health care provider. This is important. Contact a health care provider if:  You continue to experience headaches after following treatments that your health care provider recommended. Get help right away if:  You develop new headache pain.  You develop headache pain  that is different than what you have experienced in the past.  You develop numbness or tingling in your arms or legs.  You develop changes in your speech or vision. This information is not intended to replace advice given to you by your health care provider. Make sure you discuss any questions you have with your health care provider. Document Revised: 12/17/2016 Document Reviewed: 06/09/2015 Elsevier Patient Education  2020 [employer].

## 2019-02-19 NOTE — Addendum Note (Signed)
Addended by: Jean Rosenthal on: 02/19/2019 01:17 PM   Modules accepted: Level of Service

## 2019-02-19 NOTE — Progress Notes (Signed)
   CC: Headache  HPI:  Ms.Yakelin R Brauer is a 49 y.o. very pleasant African-American woman with medical history listed below presenting for evaluation of headaches.  Please see problem based charting for further details.   Past Medical History:  Diagnosis Date  . Anemia   . Fibroids, submucosal   . History of blood transfusion 02/2011   [facility] - ? 2-3 units transfused   Review of Systems: As per HPI  Physical Exam:  Vitals:   02/19/19 1033  BP: 129/78  Pulse: 65  Temp: 98.4 F (36.9 C)  TempSrc: Oral  SpO2: 100%  Weight: 241 lb 4.8 oz (109.5 kg)   Physical Exam  Constitutional: She is well-developed, well-nourished, and in no distress.  Cardiovascular: Normal rate, regular rhythm and normal heart sounds. Exam reveals no friction rub.  No murmur heard. Musculoskeletal:        General: No deformity or edema. Normal range of motion.  Neurological:  Motor strength intact bilaterally at the upper and lower extremities. No focal deficits   Psychiatric:  Tearful at times during interview    Assessment & Plan:   See Encounters Tab for problem based charting.  Patient discussed with Dr. Philipp Ovens

## 2019-02-19 NOTE — Assessment & Plan Note (Signed)
Headache: [name] has a history of chronic left-sided shoulder as well as neck pain (with CT cervical spine showing advanced cervical disc degeneration at C4- C5, C6- C7 with bulky posterior this bulging and herniation) which is being followed by [facility].  She presents today with a 1 month history of headache located at the frontal area with radiation to the occipital area.  She rates these headaches as 6-8/10.  It is sharp in nature and describes it as "nagging.  "She states that her headaches worsens with different smell, light and noise but denies any vision abnormalities.  Sometimes she would experience mild nausea.  Her headaches occurs about 3 times a day.  She denies coffee use but endorses chocolate for which consumption.  Since the onset of her headaches, she has been taking Excedrin as well as Tylenol extra strength.  For her shoulder pain, she has tried ibuprofen and Flexeril in the past.  On physical exams, there is no focal neurological deficits.  Assessment: Overuse headache versus migraine headache  Plan: -Extensively counseled patient on the importance of cessation &/or slowly titrating down pain medications for the next 3 days to 1 week to observe her symptoms. -For her shoulder pain, I advised her to try Voltaren gel if the lidocaine patches are not helping her -Advised her to call the clinic if symptoms does not improve -Advised to follow-up with [facility].

## 2019-02-21 NOTE — Telephone Encounter (Signed)
SPOKE W/ PT, STATES SHE IS FEELING SOME BETTER

## 2019-03-01 ENCOUNTER — Encounter: Payer: Self-pay | Admitting: Internal Medicine

## 2019-03-01 ENCOUNTER — Ambulatory Visit: Payer: Medicaid Other | Admitting: Internal Medicine

## 2019-03-01 VITALS — BP 127/74 | HR 79 | Temp 98.8°F | Ht 65.0 in | Wt 238.1 lb

## 2019-03-01 DIAGNOSIS — G8929 Other chronic pain: Secondary | ICD-10-CM

## 2019-03-01 DIAGNOSIS — R519 Headache, unspecified: Secondary | ICD-10-CM

## 2019-03-01 DIAGNOSIS — M5022 Other cervical disc displacement, mid-cervical region, unspecified level: Secondary | ICD-10-CM

## 2019-03-01 DIAGNOSIS — M4802 Spinal stenosis, cervical region: Secondary | ICD-10-CM | POA: Diagnosis not present

## 2019-03-01 DIAGNOSIS — M5032 Other cervical disc degeneration, mid-cervical region, unspecified level: Secondary | ICD-10-CM | POA: Diagnosis not present

## 2019-03-01 DIAGNOSIS — M25512 Pain in left shoulder: Secondary | ICD-10-CM

## 2019-03-01 DIAGNOSIS — R531 Weakness: Secondary | ICD-10-CM | POA: Insufficient documentation

## 2019-03-01 MED ORDER — GABAPENTIN 300 MG PO CAPS: 300.0000 mg | ORAL_CAPSULE | Freq: Two times a day (BID) | ORAL | 1 refills | Status: DC

## 2019-03-01 NOTE — Patient Instructions (Addendum)
[name],   I believe your headaches may be referred pain from the problems with your spine and shoulder. I recommend you start taking gabapentin 300 mg twice daily for this type of pain. This medication can make you feel drowsy and sleepy so try it first at night. Call us if it is not working for you in the next week.   Make a follow up with [place] in 2 weeks or sooner if your symptoms worsen.   - Dr. Frederico Hamman

## 2019-03-01 NOTE — Progress Notes (Signed)
   CC: [name] pain   HPI:  [name] is a 49 y.o. year-old female with PMH listed below who presents to clinic for [name] pain. Please see problem based assessment and plan for further details.   Past Medical History:  Diagnosis Date  . Anemia   . Fibroids, submucosal   . History of blood transfusion 02/2011   [facility] - ? 2-3 units transfused   Review of Systems:   Review of Systems  Constitutional: Negative for chills and fever.  Musculoskeletal: Positive for joint pain and myalgias.  Neurological: Positive for sensory change. Negative for focal weakness and weakness.    Physical Exam:  Vitals:   03/01/19 1351  BP: 127/74  Pulse: 79  Temp: 98.8 F (37.1 C)  TempSrc: Oral  SpO2: 100%  Weight: 238 lb 1.6 oz (108 kg)  Height: 5\' 5"  (1.651 m)    General: sad appearing female in NAD, tearful during encounter  Neuro: A&Ox3, no sensory or motor deficits on LUE   MSK: LUE has normal bulk and tone    Assessment & Plan:   See Encounters Tab for problem based charting.  Patient discussed with Dr. Lynnae January

## 2019-03-05 ENCOUNTER — Encounter (HOSPITAL_COMMUNITY): Payer: Self-pay | Admitting: Emergency Medicine

## 2019-03-05 ENCOUNTER — Other Ambulatory Visit: Payer: Self-pay

## 2019-03-05 ENCOUNTER — Emergency Department (HOSPITAL_COMMUNITY): Payer: Medicaid Other

## 2019-03-05 ENCOUNTER — Emergency Department (HOSPITAL_COMMUNITY)
Admission: EM | Admit: 2019-03-05 | Discharge: 2019-03-05 | Disposition: A | Discharge status: Home / Self Care | Payer: Medicaid Other | Attending: Emergency Medicine | Admitting: Emergency Medicine

## 2019-03-05 DIAGNOSIS — M79601 Pain in right arm: Secondary | ICD-10-CM | POA: Diagnosis not present

## 2019-03-05 DIAGNOSIS — Z87891 Personal history of nicotine dependence: Secondary | ICD-10-CM | POA: Insufficient documentation

## 2019-03-05 DIAGNOSIS — M542 Cervicalgia: Secondary | ICD-10-CM | POA: Diagnosis not present

## 2019-03-05 DIAGNOSIS — Z79899 Other long term (current) drug therapy: Secondary | ICD-10-CM | POA: Diagnosis not present

## 2019-03-05 DIAGNOSIS — R519 Headache, unspecified: Secondary | ICD-10-CM | POA: Insufficient documentation

## 2019-03-05 DIAGNOSIS — R0789 Other chest pain: Secondary | ICD-10-CM | POA: Diagnosis not present

## 2019-03-05 DIAGNOSIS — R079 Chest pain, unspecified: Secondary | ICD-10-CM | POA: Diagnosis not present

## 2019-03-05 DIAGNOSIS — G8929 Other chronic pain: Secondary | ICD-10-CM | POA: Insufficient documentation

## 2019-03-05 LAB — CBC
HCT: 36.4 % (ref 36.0–46.0)
Hemoglobin: 12.2 g/dL (ref 12.0–15.0)
MCH: 30 pg (ref 26.0–34.0)
MCHC: 33.5 g/dL (ref 30.0–36.0)
MCV: 89.4 fL (ref 80.0–100.0)
Platelets: 276 10*3/uL (ref 150–400)
RBC: 4.07 MIL/uL (ref 3.87–5.11)
RDW: 13.3 % (ref 11.5–15.5)
WBC: 5.1 10*3/uL (ref 4.0–10.5)
nRBC: 0 % (ref 0.0–0.2)

## 2019-03-05 LAB — TROPONIN I (HIGH SENSITIVITY)
Troponin I (High Sensitivity): <2 ng/L (ref <18)
Troponin I (High Sensitivity): <2 ng/L (ref <18)

## 2019-03-05 LAB — BASIC METABOLIC PANEL
Anion gap: 9 (ref 5–15)
BUN: 16 mg/dL (ref 6–20)
CO2: 24 mmol/L (ref 22–32)
Calcium: 9.2 mg/dL (ref 8.9–10.3)
Chloride: 106 mmol/L (ref 98–111)
Creatinine, Ser: 0.61 mg/dL (ref 0.44–1.00)
GFR calc Af Amer: >60 mL/min (ref >60)
GFR calc non Af Amer: >60 mL/min (ref >60)
Glucose, Bld: 107 mg/dL — ABNORMAL HIGH (ref 70–99)
Potassium: 4.2 mmol/L (ref 3.5–5.1)
Sodium: 139 mmol/L (ref 135–145)

## 2019-03-05 LAB — I-STAT BETA HCG BLOOD, ED (MC, WL, AP ONLY): I-stat hCG, quantitative: <5 m[IU]/mL (ref <5)

## 2019-03-05 MED ORDER — KETOROLAC TROMETHAMINE 60 MG/2ML IM SOLN
60.0000 mg | Freq: Once | INTRAMUSCULAR | Status: AC
  Administered 2019-03-05: 60 mg via INTRAMUSCULAR
  Filled 2019-03-05: qty 2

## 2019-03-05 MED ORDER — PREDNISONE 10 MG PO TABS: ORAL_TABLET | ORAL | 0 refills | Status: DC

## 2019-03-05 MED ORDER — METHYLPREDNISOLONE SODIUM SUCC 125 MG IJ SOLR
125.0000 mg | Freq: Once | INTRAMUSCULAR | Status: AC
  Administered 2019-03-05: 125 mg via INTRAMUSCULAR

## 2019-03-05 MED ORDER — METHYLPREDNISOLONE SODIUM SUCC 125 MG IJ SOLR
125.0000 mg | Freq: Once | INTRAMUSCULAR | Status: DC
  Filled 2019-03-05: qty 2

## 2019-03-05 NOTE — Assessment & Plan Note (Signed)
Patient has a 39-month history of left shoulder pain that radiates to her forearm and her neck and is associated with numbness and tingling.  Cervical spine MRI performed on 12/2018 showed advanced cervical disc degeneration C4-C7 with disc herniation and moderate spinal stenosis with moderate spinal cord mass-effect, but no cord signal abnormality.  She was treated with Flexeril which did not help. She then tried Tylenol and NSAIDs which initially helped, but she is not experiencing relief with these now. She was seen recently for headaches that were thought to be secondary to medication overuse and stopped using NSAIDs and Tylenol 2 weeks ago.  Since then, her symptoms have worsen and she is having difficulty at work and at home due to pain. 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 I recommended medication for neuropathy pain which she is agreeable to. I suspect her headaches may be referred pain from her shoulder associated with tension headaches and would expect them to improve if we can help alleviate her pain.  - Gabapentin 300 mg BID. Side effects discussed  - Discussed neurosurgery referral, but patient would like to try medications first

## 2019-03-05 NOTE — ED Provider Notes (Signed)
[facility] EMERGENCY DEPARTMENT Provider Note   CSN: [id number] Arrival date & time: 03/05/19  1333     History Chief Complaint  Patient presents with  . Chest Pain    [name] is a 49 y.o. female.  Patient complains of right sided chest pain today.  Patient reports the pain goes down her right arm.  Patient complains of a headache.  Patient reports that she has been having pain in her neck and headaches since November she had an MRI which showed degenerative changes in her neck patient has been seen by the neurosurgeon who wanted her to have further MRI evaluation.  Patient states that her insurance would not pay for further MRIs.  Reports her primary care recently started her on a medication to help with her symptoms.  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  Reports her primary care doctor feels like she is developing migraine headaches due to the problems in her neck.  Patient reports she became concerned today when she had pain in the right side of her chest.  Patient feels like this may be related to what is going on in her neck or due to anxiety from the problems that she is having.  The history is provided by the patient. No language interpreter was used.  Chest Pain Associated symptoms: no shortness of breath        Past Medical History:  Diagnosis Date  . Anemia   . Fibroids, submucosal   . History of blood transfusion 02/2011   [facility] - ? 2-3 units transfused    Patient Active Problem List   Diagnosis Date Noted  . Weakness 03/01/2019  . Headache 02/19/2019  . Shoulder pain, left 12/07/2018  . Iron deficiency anemia 11/07/2018  . Elevated blood pressure reading 11/07/2018  . Preventative health care 11/07/2018  . Postoperative state 04/16/2013  . Hematometra 01/09/2013  . Menorrhagia 01/09/2013  . Chronic pelvic pain in female  01/09/2013    Past Surgical History:  Procedure Laterality Date  . APPENDECTOMY    . BILATERAL SALPINGECTOMY Bilateral 04/16/2013   Procedure: BILATERAL SALPINGECTOMY;  Surgeon: Lavonia Drafts, MD;  Location: [facility];  Service: Gynecology;  Laterality: Bilateral;  . [place], 2002  . DILATION AND CURETTAGE OF UTERUS N/A 03/31/2012   Procedure: DILATATION AND CURETTAGE;  Surgeon: Emily Filbert, MD;  Location: [facility];  Service: Gynecology;  Laterality: N/A;  . LAPAROSCOPIC LYSIS OF ADHESIONS N/A 03/31/2012   Procedure: LAPAROSCOPIC LYSIS OF ADHESIONS;  Surgeon: Emily Filbert, MD;  Location: [facility];  Service: Gynecology;  Laterality: N/A;  . LAPAROSCOPY N/A 03/31/2012   Procedure: LAPAROSCOPY OPERATIVE;  Surgeon: Emily Filbert, MD;  Location: [facility];  Service: Gynecology;  Laterality: N/A;  . NOVASURE ABLATION N/A 03/31/2012   Procedure: NOVASURE ABLATION;  Surgeon: Emily Filbert, MD;  Location: [facility];  Service: Gynecology;  Laterality: N/A;  . ROBOTIC ASSISTED TOTAL HYSTERECTOMY N/A 04/16/2013   Procedure: ROBOTIC ASSISTED TOTAL HYSTERECTOMY;  Surgeon: Lavonia Drafts, MD;  Location: [facility];  Service: Gynecology;  Laterality: N/A;  . TUBAL LIGATION  2002  . WISDOM TOOTH EXTRACTION       OB History    Gravida  3   Para  2   Term  2   Preterm      AB  1  Living  2     SAB  1   TAB      Ectopic      Multiple      Live Births              Family History  Problem Relation Age of Onset  . Hypertension Mother   . Seizures Father   . Fibroids Sister     Social History   Tobacco Use  . Smoking status: Former Smoker    Packs/day: 0.00    Types: Cigarettes    Quit date: 01/21/2011    Years since quitting: 8.1  . Smokeless tobacco: Never Used  Substance Use Topics  . Alcohol use: No    Comment: social occasions  . Drug use: No    Home Medications Prior to Admission medications   Medication Sig Start Date End Date Taking? Authorizing Provider   azelastine (ASTELIN) 0.1 % nasal spray Place 1 spray into both nostrils 2 (two) times daily. Use in each nostril as directed 12/01/17   Shella Maxim, NP  benzonatate (TESSALON) 100 MG capsule Take 1 capsule (100 mg total) by mouth 3 (three) times daily as needed for cough. 11/11/18   Ward, Delice Bison, DO  ferrous sulfate 325 (65 FE) MG tablet Take 1 tablet (325 mg total) by mouth every other day. 11/08/18 02/06/19  Neva Seat, MD  gabapentin (NEURONTIN) 300 MG capsule Take 1 capsule (300 mg total) by mouth 2 (two) times daily. 03/01/19   Welford Roche, MD  ibuprofen (ADVIL) 600 MG tablet Take 1 tablet (600 mg total) by mouth every 6 (six) hours as needed. 11/02/18   Sharion Balloon, NP    Allergies    Bee venom and Bactrim [sulfamethoxazole-trimethoprim]  Review of Systems   Review of Systems  Respiratory: Negative for shortness of breath.   Cardiovascular: Positive for chest pain.  All other systems reviewed and are negative.   Physical Exam Updated Vital Signs BP (!) 186/89   Pulse 69   Temp 98.2 F (36.8 C)   Resp 18   Ht 5\' 5"  (1.651 m)   Wt 108 kg   LMP 03/31/2013 (Exact Date)   SpO2 100%   BMI 39.62 kg/m   Physical Exam Vitals and nursing note reviewed.  Constitutional:      Appearance: She is well-developed.  HENT:     Head: Normocephalic.  Cardiovascular:     Rate and Rhythm: Normal rate and regular rhythm.     Heart sounds: Normal heart sounds.  Pulmonary:     Effort: Pulmonary effort is normal.     Breath sounds: Normal breath sounds.  Abdominal:     General: There is no distension.     Palpations: Abdomen is soft.  Musculoskeletal:        General: Normal range of motion.     Cervical back: Normal range of motion.  Skin:    General: Skin is warm.  Neurological:     Mental Status: She is alert. She is disoriented.  Psychiatric:        Mood and Affect: Mood normal.     ED Results / Procedures / Treatments   Labs (all labs ordered are  listed, but only abnormal results are displayed) Labs Reviewed  BASIC METABOLIC PANEL - Abnormal; Notable for the following components:      Result Value   Glucose, Bld 107 (*)    All other components within normal limits  CBC  I-STAT BETA HCG BLOOD,  ED ([facility], [facility], [facility] ONLY)  TROPONIN I (HIGH SENSITIVITY)  TROPONIN I (HIGH SENSITIVITY)    EKG None  Radiology DG Chest 2 View  Result Date: 03/05/2019 CLINICAL DATA:  Intermittent chest pain EXAM: CHEST - 2 VIEW COMPARISON:  11/11/2018 FINDINGS: Lungs are clear.  No pleural effusion or pneumothorax. The heart is normal in size. Mild degenerative changes of the visualized thoracolumbar spine. IMPRESSION: Normal chest radiographs. Electronically Signed   By: Julian Hy M.D.   On: 03/05/2019 14:52    Procedures Procedures (including critical care time)  Medications Ordered in ED Medications - No data to display  ED Course  I have reviewed the triage vital signs and the nursing notes.  Pertinent labs & imaging results that were available during my care of the patient were reviewed by me and considered in my medical decision making (see chart for details).    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  I think this is the cause of headache and neck pain.  Pt has 2 normal troponins.  Chest xray is normal.  Pt has had multiple previous chest pain visits.  Pt given number for cardiology for evaluation.  Pt advised to follow up with neurosurgeon as scheduled.  Final Clinical Impression(s) / ED Diagnoses Final diagnoses:  Chest wall pain  Chronic neck pain    Rx / DC Orders ED Discharge Orders         Ordered    predniSONE (DELTASONE) 10 MG tablet     03/05/19 1959           Sidney Ace 03/05/19 2004    Veryl Speak, MD 03/05/19 2234

## 2019-03-05 NOTE — ED Triage Notes (Signed)
Pt reports generalized chest pain for a week that has felt heavy. Also endorses neck pain and right hand numbness. Intermittent lightheadedness and dizziness.

## 2019-03-05 NOTE — Discharge Instructions (Signed)
Continue gabapentin and flexeril  See the cardiologist for evaluation.

## 2019-03-05 NOTE — Progress Notes (Signed)
Internal Medicine Clinic Attending  Case discussed with Dr. Santos at the time of the visit.  We reviewed the resident's history and exam and pertinent patient test results.  I agree with the assessment, diagnosis, and plan of care documented in the resident's note.    

## 2019-03-05 NOTE — ED Notes (Signed)
Discharge instructions discussed with pt. Pt verbalized understanding with no questions at this time.  

## 2019-03-07 ENCOUNTER — Other Ambulatory Visit: Payer: Self-pay | Admitting: Internal Medicine

## 2019-03-07 DIAGNOSIS — G959 Disease of spinal cord, unspecified: Secondary | ICD-10-CM | POA: Insufficient documentation

## 2019-03-07 DIAGNOSIS — M25512 Pain in left shoulder: Secondary | ICD-10-CM

## 2019-03-07 NOTE — Progress Notes (Signed)
Encounter to document assessment/plan from neurosurgery appointment on 01/26/2019

## 2019-03-07 NOTE — Assessment & Plan Note (Signed)
Patient evaluated by neurosurgery on 01/26/2019. Per their note, they think the left shoulder pain is clinically consistent with an infraspinatus injury and are getting an additional MRI to evaluate.  They think her bilateral hand numbness is clinically more consistent with carpal tunnel syndrome and cervical radiculopathy.  They ordered an EMG/NCV to evaluate.

## 2019-03-07 NOTE — Assessment & Plan Note (Addendum)
Per neurosurgery note on 01/26/2019. This is 2/2 central canal stenosis and patient has positive Hoffmann's sign on the right but patient has no clinical complaints or other signs/symptoms of cervical myeolopathy. Neurosurgery states patient will likely eventually require treatment.

## 2019-03-11 ENCOUNTER — Emergency Department (HOSPITAL_COMMUNITY): Payer: Medicaid Other

## 2019-03-11 ENCOUNTER — Other Ambulatory Visit: Payer: Self-pay

## 2019-03-11 ENCOUNTER — Encounter (HOSPITAL_COMMUNITY): Payer: Self-pay | Admitting: Emergency Medicine

## 2019-03-11 ENCOUNTER — Emergency Department (HOSPITAL_COMMUNITY)
Admission: EM | Admit: 2019-03-11 | Discharge: 2019-03-12 | Disposition: A | Discharge status: Home / Self Care | Payer: Medicaid Other | Attending: Emergency Medicine | Admitting: Emergency Medicine

## 2019-03-11 DIAGNOSIS — M542 Cervicalgia: Secondary | ICD-10-CM | POA: Diagnosis present

## 2019-03-11 DIAGNOSIS — M5412 Radiculopathy, cervical region: Secondary | ICD-10-CM

## 2019-03-11 DIAGNOSIS — Z87891 Personal history of nicotine dependence: Secondary | ICD-10-CM | POA: Insufficient documentation

## 2019-03-11 DIAGNOSIS — Z79899 Other long term (current) drug therapy: Secondary | ICD-10-CM | POA: Diagnosis not present

## 2019-03-11 DIAGNOSIS — R0789 Other chest pain: Secondary | ICD-10-CM | POA: Diagnosis not present

## 2019-03-11 DIAGNOSIS — R079 Chest pain, unspecified: Secondary | ICD-10-CM | POA: Diagnosis not present

## 2019-03-11 DIAGNOSIS — F419 Anxiety disorder, unspecified: Secondary | ICD-10-CM | POA: Insufficient documentation

## 2019-03-11 DIAGNOSIS — R519 Headache, unspecified: Secondary | ICD-10-CM | POA: Insufficient documentation

## 2019-03-11 MED ORDER — SODIUM CHLORIDE 0.9% FLUSH
3.0000 mL | Freq: Once | INTRAVENOUS | Status: AC
  Administered 2019-03-12: 3 mL via INTRAVENOUS

## 2019-03-11 NOTE — ED Triage Notes (Signed)
Pt c/o L shoulder, diffuse chest pain, neck soreness. Pt states she has been having this pain for months but soreness and headaches are new.

## 2019-03-12 DIAGNOSIS — M5412 Radiculopathy, cervical region: Secondary | ICD-10-CM | POA: Diagnosis not present

## 2019-03-12 DIAGNOSIS — R079 Chest pain, unspecified: Secondary | ICD-10-CM | POA: Diagnosis not present

## 2019-03-12 LAB — CBC
HCT: 36.5 % (ref 36.0–46.0)
Hemoglobin: 12.2 g/dL (ref 12.0–15.0)
MCH: 29.8 pg (ref 26.0–34.0)
MCHC: 33.4 g/dL (ref 30.0–36.0)
MCV: 89 fL (ref 80.0–100.0)
Platelets: 287 10*3/uL (ref 150–400)
RBC: 4.1 MIL/uL (ref 3.87–5.11)
RDW: 13.8 % (ref 11.5–15.5)
WBC: 12.9 10*3/uL — ABNORMAL HIGH (ref 4.0–10.5)
nRBC: 0 % (ref 0.0–0.2)

## 2019-03-12 LAB — BASIC METABOLIC PANEL
Anion gap: 10 (ref 5–15)
BUN: 21 mg/dL — ABNORMAL HIGH (ref 6–20)
CO2: 22 mmol/L (ref 22–32)
Calcium: 8.4 mg/dL — ABNORMAL LOW (ref 8.9–10.3)
Chloride: 105 mmol/L (ref 98–111)
Creatinine, Ser: 0.6 mg/dL (ref 0.44–1.00)
GFR calc Af Amer: >60 mL/min (ref >60)
GFR calc non Af Amer: >60 mL/min (ref >60)
Glucose, Bld: 101 mg/dL — ABNORMAL HIGH (ref 70–99)
Potassium: 3.9 mmol/L (ref 3.5–5.1)
Sodium: 137 mmol/L (ref 135–145)

## 2019-03-12 LAB — I-STAT BETA HCG BLOOD, ED (MC, WL, AP ONLY): I-stat hCG, quantitative: <5 m[IU]/mL (ref <5)

## 2019-03-12 LAB — TROPONIN I (HIGH SENSITIVITY): Troponin I (High Sensitivity): 3 ng/L (ref <18)

## 2019-03-12 MED ORDER — CYCLOBENZAPRINE HCL 10 MG PO TABS: 10.0000 mg | ORAL_TABLET | Freq: Two times a day (BID) | ORAL | 0 refills | Status: DC | PRN

## 2019-03-12 MED ORDER — DIPHENHYDRAMINE HCL 50 MG/ML IJ SOLN
25.0000 mg | Freq: Once | INTRAMUSCULAR | Status: AC
  Administered 2019-03-12: 25 mg via INTRAVENOUS
  Filled 2019-03-12: qty 1

## 2019-03-12 MED ORDER — SODIUM CHLORIDE 0.9 % IV BOLUS
1000.0000 mL | Freq: Once | INTRAVENOUS | Status: AC
  Administered 2019-03-12: 1000 mL via INTRAVENOUS

## 2019-03-12 MED ORDER — KETOROLAC TROMETHAMINE 30 MG/ML IJ SOLN
30.0000 mg | Freq: Once | INTRAMUSCULAR | Status: AC
  Administered 2019-03-12: 30 mg via INTRAVENOUS
  Filled 2019-03-12: qty 1

## 2019-03-12 MED ORDER — METOCLOPRAMIDE HCL 5 MG/ML IJ SOLN
10.0000 mg | Freq: Once | INTRAMUSCULAR | Status: AC
  Administered 2019-03-12: 10 mg via INTRAVENOUS
  Filled 2019-03-12: qty 2

## 2019-03-12 NOTE — Discharge Instructions (Signed)
Your symptoms are thought to be related to the pinched nerves in your neck.  Please follow-up with your doctor and your neurosurgeon for further treatment and management.  I have prescribed a muscle relaxer, which should help with the muscle tightness, and may alleviate some of your symptoms.

## 2019-03-12 NOTE — ED Provider Notes (Signed)
[facility] EMERGENCY DEPARTMENT Provider Note   CSN: [id number] Arrival date & time: 03/11/19  2332     History Chief Complaint  Patient presents with  . Chest Pain    [name] is a 49 y.o. female.  Patient presents to the emergency department with a chief complaint of chest pain.  She states that she has been dealing with this pain for the past several months.  Reports that since October she has been having neck pain and left arm pain.  She reports intermittent headaches and chest pain that she associates with the symptoms.  She was recently diagnosed with several cervical disc herniations, and is being seen by neurosurgery for this.  She states that tonight she was having tightness in her upper neck and left shoulder which caused her to feel anxious.  She states that she has also had some chest pain associated with this.  She states that she feels sore in the muscles in her chest, left shoulder, upper back, and neck.  She has been taking a prednisone taper for the past week.  She was also prescribed gabapentin, but has not tried taking this yet.  She has follow-up with her doctor to have MRI of left shoulder to rule out rotator cuff injury, but per notes in epic, it is expected that she will likely need some sort of intervention for her cervical disc herniations.  The history is provided by the patient. No language interpreter was used.       Past Medical History:  Diagnosis Date  . Anemia   . Fibroids, submucosal   . History of blood transfusion 02/2011   [facility] - ? 2-3 units transfused    Patient Active Problem List   Diagnosis Date Noted  . Cervical myelopathy ([place]) 03/07/2019  . Weakness 03/01/2019  . Headache 02/19/2019  . Shoulder pain, left 12/07/2018  . Iron deficiency anemia 11/07/2018  . Elevated blood pressure reading 11/07/2018  . Preventative health care 11/07/2018  . Postoperative state 04/16/2013  . Hematometra 01/09/2013  .  Menorrhagia 01/09/2013  . Chronic pelvic pain in female 01/09/2013    Past Surgical History:  Procedure Laterality Date  . APPENDECTOMY    . BILATERAL SALPINGECTOMY Bilateral 04/16/2013   Procedure: BILATERAL SALPINGECTOMY;  Surgeon: Lavonia Drafts, MD;  Location: [facility];  Service: Gynecology;  Laterality: Bilateral;  . [place], 2002  . DILATION AND CURETTAGE OF UTERUS N/A 03/31/2012   Procedure: DILATATION AND CURETTAGE;  Surgeon: Emily Filbert, MD;  Location: [facility];  Service: Gynecology;  Laterality: N/A;  . LAPAROSCOPIC LYSIS OF ADHESIONS N/A 03/31/2012   Procedure: LAPAROSCOPIC LYSIS OF ADHESIONS;  Surgeon: Emily Filbert, MD;  Location: [facility];  Service: Gynecology;  Laterality: N/A;  . LAPAROSCOPY N/A 03/31/2012   Procedure: LAPAROSCOPY OPERATIVE;  Surgeon: Emily Filbert, MD;  Location: [facility];  Service: Gynecology;  Laterality: N/A;  . NOVASURE ABLATION N/A 03/31/2012   Procedure: NOVASURE ABLATION;  Surgeon: Emily Filbert, MD;  Location: [facility];  Service: Gynecology;  Laterality: N/A;  . ROBOTIC ASSISTED TOTAL HYSTERECTOMY N/A 04/16/2013   Procedure: ROBOTIC ASSISTED TOTAL HYSTERECTOMY;  Surgeon: Lavonia Drafts, MD;  Location: [facility];  Service: Gynecology;  Laterality: N/A;  . TUBAL LIGATION  2002  . WISDOM TOOTH EXTRACTION       OB History    Gravida  3   Para  2   Term  2   Preterm  AB  1   Living  2     SAB  1   TAB      Ectopic      Multiple      Live Births              Family History  Problem Relation Age of Onset  . Hypertension Mother   . Seizures Father   . Fibroids Sister     Social History   Tobacco Use  . Smoking status: Former Smoker    Packs/day: 0.00    Types: Cigarettes    Quit date: 01/21/2011    Years since quitting: 8.1  . Smokeless tobacco: Never Used  Substance Use Topics  . Alcohol use: No    Comment: social occasions  . Drug use: No    Home Medications Prior to Admission medications    Medication Sig Start Date End Date Taking? Authorizing Provider  azelastine (ASTELIN) 0.1 % nasal spray Place 1 spray into both nostrils 2 (two) times daily. Use in each nostril as directed Patient not taking: Reported on 03/05/2019 12/01/17   Shella Maxim, NP  benzonatate (TESSALON) 100 MG capsule Take 1 capsule (100 mg total) by mouth 3 (three) times daily as needed for cough. Patient not taking: Reported on 03/05/2019 11/11/18   Ward, Delice Bison, DO  ferrous sulfate 325 (65 FE) MG tablet Take 1 tablet (325 mg total) by mouth every other day. 11/08/18 02/06/19  Neva Seat, MD  gabapentin (NEURONTIN) 300 MG capsule Take 1 capsule (300 mg total) by mouth 2 (two) times daily. 03/01/19   Welford Roche, MD  ibuprofen (ADVIL) 600 MG tablet Take 1 tablet (600 mg total) by mouth every 6 (six) hours as needed. Patient not taking: Reported on 03/05/2019 11/02/18   Sharion Balloon, NP  predniSONE (DELTASONE) 10 MG tablet [id number] taper 03/05/19   Fransico Meadow, PA-C    Allergies    Bee venom and Bactrim [sulfamethoxazole-trimethoprim]  Review of Systems   Review of Systems  All other systems reviewed and are negative.   Physical Exam Updated Vital Signs BP (!) 154/101   Pulse 90   Temp 97.9 F (36.6 C) (Oral)   Resp 19   Wt 108 kg   LMP 03/31/2013 (Exact Date)   SpO2 100%   BMI 39.62 kg/m   Physical Exam Vitals and nursing note reviewed.  Constitutional:      General: She is not in acute distress.    Appearance: She is well-developed.  HENT:     Head: Normocephalic and atraumatic.  Eyes:     Conjunctiva/sclera: Conjunctivae normal.  Cardiovascular:     Rate and Rhythm: Normal rate and regular rhythm.     Heart sounds: No murmur.  Pulmonary:     Effort: Pulmonary effort is normal. No respiratory distress.     Breath sounds: Normal breath sounds.  Abdominal:     Palpations: Abdomen is soft.     Tenderness: There is no abdominal tenderness.  Musculoskeletal:      Cervical back: Neck supple.     Comments: Muscular tenderness in left upper back and upper neck  Skin:    General: Skin is warm and dry.  Neurological:     Mental Status: She is alert and oriented to person, place, and time.     Comments: Moves all extremities Strength of left upper extremity is 5/5 isolated to each joint  Psychiatric:        Behavior: Behavior normal.  Comments: Anxious     ED Results / Procedures / Treatments   Labs (all labs ordered are listed, but only abnormal results are displayed) Labs Reviewed  BASIC METABOLIC PANEL  CBC  I-STAT BETA HCG BLOOD, ED ([facility], [facility], [facility] ONLY)  TROPONIN I (HIGH SENSITIVITY)    EKG EKG Interpretation  Date/Time:  "Sunday March 11 2019 23:36:24 EST Ventricular Rate:  82 PR Interval:  144 QRS Duration: 78 QT Interval:  394 QTC Calculation: 460 R Axis:   48 Text Interpretation: Sinus rhythm with marked sinus arrhythmia Otherwise normal ECG When compared with ECG of 03/05/2019, No significant change was found Confirmed by Glick, David ([id number]) on 03/12/2019 12:19:26 AM   Radiology DG Chest 2 View  Result Date: 03/11/2019 CLINICAL DATA:  Chest pain, diffuse chest pain and neck soreness EXAM: CHEST - 2 VIEW COMPARISON:  Radiograph 03/05/2019 FINDINGS: No consolidation, features of edema, pneumothorax, or effusion. The cardiomediastinal contours are unremarkable. No acute osseous or soft tissue abnormality. Degenerative changes are present in the imaged spine and shoulders. IMPRESSION: No acute cardiopulmonary abnormality. Electronically Signed   By: Price  DeHay M.D.   On: 03/11/2019 23:58    Procedures Procedures (including critical care time)  Medications Ordered in ED Medications  sodium chloride flush (NS) 0.9 % injection 3 mL (has no administration in time range)  ketorolac (TORADOL) 30 MG/ML injection 30 mg (has no administration in time range)  metoCLOPramide (REGLAN) injection 10 mg (has no administration in time  range)  diphenhydrAMINE (BENADRYL) injection 25 mg (has no administration in time range)  sodium chloride 0.9 % bolus 1,000 mL (has no administration in time range)    ED Course  I have reviewed the triage vital signs and the nursing notes.  Pertinent labs & imaging results that were available during my care of the patient were reviewed by me and considered in my medical decision making (see chart for details).    MDM Rules/Calculators/A&P                      Patient with symptoms that sound consistent with cervical radiculopathy and associated tension headache.  I have a low suspicion for ACS.  Doubt cardiac and pulmonary etiologies of the patient's chest pain tonight.  I suspect that it was likely anxiety driven.  Laboratory work-up is in process.  Will treat patient's symptoms and reassess.  Anticipate discharge with continued outpatient work-up.  1:41 AM Patient feeling much better.  Patient seen by and discussed with Dr. Glick, who agrees with plan for discharge.   Final Clinical Impression(s) / ED Diagnoses Final diagnoses:  Cervical radiculopathy    Rx / DC Orders ED Discharge Orders         Ordered    cyclobenzaprine (FLEXERIL) 10 MG tablet  2 times daily PRN     02" /22/21 0140           Montine Circle, PA-C [id number]    Delora Fuel, MD [id number] [phone]

## 2019-03-15 ENCOUNTER — Encounter: Payer: Self-pay | Admitting: Internal Medicine

## 2019-03-15 ENCOUNTER — Ambulatory Visit: Payer: Medicaid Other | Admitting: Internal Medicine

## 2019-03-15 ENCOUNTER — Other Ambulatory Visit: Payer: Self-pay

## 2019-03-15 VITALS — BP 134/78 | HR 76 | Temp 98.5°F | Ht 65.0 in | Wt 242.7 lb

## 2019-03-15 DIAGNOSIS — Z79899 Other long term (current) drug therapy: Secondary | ICD-10-CM

## 2019-03-15 DIAGNOSIS — G8929 Other chronic pain: Secondary | ICD-10-CM | POA: Diagnosis not present

## 2019-03-15 DIAGNOSIS — M25512 Pain in left shoulder: Secondary | ICD-10-CM | POA: Diagnosis present

## 2019-03-15 DIAGNOSIS — G959 Disease of spinal cord, unspecified: Secondary | ICD-10-CM

## 2019-03-15 DIAGNOSIS — M5412 Radiculopathy, cervical region: Secondary | ICD-10-CM | POA: Diagnosis not present

## 2019-03-15 MED ORDER — DULOXETINE HCL 20 MG PO CPEP: 20.0000 mg | ORAL_CAPSULE | Freq: Every day | ORAL | 3 refills | Status: DC

## 2019-03-15 NOTE — Addendum Note (Signed)
Addended by: Lalla Brothers T on: 03/15/2019 10:12 AM   Modules accepted: Level of Service

## 2019-03-15 NOTE — Patient Instructions (Signed)
Ms. Culliton,   Thanks for seeing [place] today. I am starting you on a medication that will help with the pain you are having long term. I will also encourage you to follow up with the neurosurgeon.   Take care! Dr. Eileen Stanford  Please call the internal medicine center clinic if you have any questions or concerns, we may be able to help and keep you from a long and expensive emergency room wait. Our clinic and after hours phone number is [phone], the best time to call is Monday through Friday 9 am to 4 pm but there is always someone available 24/7 if you have an emergency. If you need medication refills please notify your pharmacy one week in advance and they will send [place] a request.

## 2019-03-15 NOTE — Progress Notes (Signed)
Internal Medicine Clinic Attending  Case discussed with Dr. Agyei at the time of the visit.  We reviewed the resident's history and exam and pertinent patient test results.  I agree with the assessment, diagnosis, and plan of care documented in the resident's note.    

## 2019-03-15 NOTE — Assessment & Plan Note (Addendum)
Cervical radiculopathy: [name] has been evaluated by my colleagues Dr. Frederico Hamman and Dr. Marigene Ehlers for chronic left shoulder pain.  Her last visit at the clinic was 03/01/2019 where she complained of a 68-month history of left shoulder pain, neck pain with associated radiculopathic symptoms including numbness and tingling.  Per chart review: 01/13/2019: MRI of the cervical spine revealed advanced C4-C5, C6-C7 disc herniation, degeneration, spinal stenosis with moderate cord mass-effect.  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  An MRI of her left shoulder was ordered  03/01/2019: Clinic visit where she was tried on gabapentin.  There was also discussion to be evaluated by neurosurgery but patient wanted to try medications first.  03/11/2019: Presented to the emergency department with chest pain which she reported that she had been dealing with for "past several months.  "Work-up for ACS were unremarkable and was subsequently discharged on Flexeril as it was thought her symptoms were likely secondary to her chronic cervical radiculopathy and left shoulder pain.  Today, she reports that her shoulder and neck pain is unchanged.  She does report some relief with gabapentin however it makes her drowsy so she tries to take it at night or on her off days from work.  She is aware of her upcoming neurosurgery appointment on Monday.  Plan: -Continue gabapentin qhs -Start Cymbalta given there is evidence for management of chronic pain.  She is willing to try medication and monitor progress

## 2019-03-15 NOTE — Progress Notes (Signed)
   CC: Follow-up left shoulder pain, cervical radiculopathy  HPI:  [name] is a 49 y.o. with medical history listed below presenting to follow-up on left shoulder pain and cervical radiculopathy.  Please see problem based charting for further details.  Past Medical History:  Diagnosis Date  . Anemia   . Fibroids, submucosal   . History of blood transfusion 02/2011   [facility] - ? 2-3 units transfused   Review of Systems:  As per HPI  Physical Exam:  Vitals:   03/15/19 0845  BP: 134/78  Pulse: 76  Temp: 98.5 F (36.9 C)  TempSrc: Oral  SpO2: 100%  Weight: 242 lb 11.2 oz (110.1 kg)  Height: 5\' 5"  (1.651 m)   Physical Exam  Constitutional: No distress.  Cardiovascular: Normal rate, regular rhythm and normal heart sounds.  Pulmonary/Chest: Effort normal and breath sounds normal. No respiratory distress. She has no wheezes.  Musculoskeletal:     Left shoulder: Tenderness present. No swelling, effusion or bony tenderness. Decreased range of motion.       Arms:     Cervical back: Tenderness and pain present. No swelling, edema or bony tenderness.  Skin: She is not diaphoretic.    Assessment & Plan:   See Encounters Tab for problem based charting.  Patient discussed with Dr. Evette Doffing

## 2019-03-19 DIAGNOSIS — G959 Disease of spinal cord, unspecified: Secondary | ICD-10-CM | POA: Diagnosis not present

## 2019-03-20 ENCOUNTER — Other Ambulatory Visit: Payer: Self-pay | Admitting: Orthopedic Surgery

## 2019-04-17 NOTE — Progress Notes (Addendum)
[place], [place] AT [facility] & [place] [address] Phone: [phone] Fax: [phone]  [place] ([place]), [place] - [address] [place] [address] Phone: [phone] Fax: [place] [address], [place] - [id number] [place] [address] Phone: [phone] Fax: [phone]      Your procedure is scheduled on April 8  Report to [facility] En[facility] "A" at 0530 A.M., and check in at the Admitting office.  Call this number if you have problems the morning of surgery:  [phone]  Call [phone] if you have any questions prior to your surgery date Monday-Friday 8am-4pm    Remember:  Do not  drink after midnight the night before your surgery  You may drink clear liquids until 0430am the morning of your surgery.   Clear liquids allowed are: Water, Non-Citrus Juices (without pulp), Carbonated Beverages, Clear Tea, Black Coffee Only, and Gatorade   Enhanced Recovery after Surgery for Orthopedics Enhanced Recovery after Surgery is a protocol used to improve the stress on your body and your recovery after surgery.  Patient Instructions  . The night before surgery:  o No food after midnight. ONLY clear liquids after midnight  .  Marland Kitchen The day of surgery (if you do NOT have diabetes):  o Drink ONE (1) Pre-Surgery Clear Ensure as directed.   o This drink was given to you during your hospital  pre-op appointment visit. o The pre-op nurse will instruct you on the time to drink the  Pre-Surgery Ensure depending on your surgery time. o Finish the drink at the designated time by the pre-op nurse.  o Nothing else to drink after completing the  Pre-Surgery Clear Ensure.   Take these medicines the morning of surgery with A SIP OF WATER  acetaminophen (TYLENOL) if  needed DULoxetine (CYMBALTA) gabapentin (NEURONTIN)   As of today, stop taking Aspirin (unless instructed by your doctor) and other Aspirin containing products, Aleve, Naproxen, Ibuprofen, Motrin, Advil, Goody's, BC's, all herbal medications, fish oil, and all vitamins.  Do not Smoke (Tobacco/Vapping) or drink Alcohol 24 hours prior to your procedure If you use a CPAP at night, you may bring all equipment for your overnight stay.                        Do not wear jewelry, make up, or nail polish            Do not wear lotions, powders, perfumes or deodorant.            Do not shave 48 hours prior to surgery.             Do not bring valuables to the hospital.            [facility] is not responsible for any belongings or valuables.   Contacts, glasses, dentures or bridgework may not be worn into surgery.      For patients admitted to the hospital, discharge time will be determined by your treatment team.   Patients discharged the day of surgery will not be allowed to drive home, and someone needs to stay with them for 24 hours.    Special instructions:   [facility]- Preparing For Surgery  Before surgery, you can play an important role. Because skin is not sterile,  your skin needs to be as free of germs as possible. You can reduce the number of germs on your skin by washing with CHG (chlorahexidine gluconate) Soap before surgery.  CHG is an antiseptic cleaner which kills germs and bonds with the skin to continue killing germs even after washing.    Oral Hygiene is also important to reduce your risk of infection.  Remember - BRUSH YOUR TEETH THE MORNING OF SURGERY WITH YOUR REGULAR TOOTHPASTE  Please do not use if you have an allergy to CHG or antibacterial soaps. If your skin becomes reddened/irritated stop using the CHG.  Do not shave (including legs and underarms) for at least 48 hours prior to first CHG shower. It is OK to shave your face.  Please follow these instructions  carefully.   1. Shower the NIGHT BEFORE SURGERY and the MORNING OF SURGERY with CHG Soap.   2. If you chose to wash your hair, wash your hair first as usual with your normal shampoo.  3. After you shampoo, rinse your hair and body thoroughly to remove the shampoo.  4. Use CHG as you would any other liquid soap. You can apply CHG directly to the skin and wash gently with a scrungie or a clean washcloth.   5. Apply the CHG Soap to your body ONLY FROM THE NECK DOWN.  Do not use on open wounds or open sores. Avoid contact with your eyes, ears, mouth and genitals (private parts). Wash Face and genitals (private parts)  with your normal soap.   6. Wash thoroughly, paying special attention to the area where your surgery will be performed.  7. Thoroughly rinse your body with warm water from the neck down.  8. DO NOT shower/wash with your normal soap after using and rinsing off the CHG Soap.  9. Pat yourself dry with a CLEAN TOWEL.  10. Wear CLEAN PAJAMAS to bed the night before surgery, wear comfortable clothes the morning of surgery  11. Place CLEAN SHEETS on your bed the night of your first shower and DO NOT SLEEP WITH PETS.   Day of Surgery:   Do not apply any deodorants/lotions.  Please wear clean clothes to the hospital/surgery center.   Remember to brush your teeth WITH YOUR REGULAR TOOTHPASTE.   Please read over the following fact sheets that you were given.

## 2019-04-18 ENCOUNTER — Encounter (HOSPITAL_COMMUNITY): Payer: Self-pay

## 2019-04-18 ENCOUNTER — Other Ambulatory Visit: Payer: Self-pay

## 2019-04-18 ENCOUNTER — Encounter (HOSPITAL_COMMUNITY)
Admission: RE | Admit: 2019-04-18 | Discharge: 2019-04-18 | Disposition: A | Discharge status: Home / Self Care | Payer: Medicaid Other | Source: Ambulatory Visit | Attending: Orthopedic Surgery | Admitting: Orthopedic Surgery

## 2019-04-18 DIAGNOSIS — Z01812 Encounter for preprocedural laboratory examination: Secondary | ICD-10-CM | POA: Insufficient documentation

## 2019-04-18 LAB — TYPE AND SCREEN
ABO/RH(D): A POS
Antibody Screen: NEGATIVE

## 2019-04-18 LAB — COMPREHENSIVE METABOLIC PANEL
ALT: 12 U/L (ref 0–44)
AST: 16 U/L (ref 15–41)
Albumin: 3.9 g/dL (ref 3.5–5.0)
Alkaline Phosphatase: 60 U/L (ref 38–126)
Anion gap: 5 (ref 5–15)
BUN: 16 mg/dL (ref 6–20)
CO2: 24 mmol/L (ref 22–32)
Calcium: 8.9 mg/dL (ref 8.9–10.3)
Chloride: 109 mmol/L (ref 98–111)
Creatinine, Ser: 0.61 mg/dL (ref 0.44–1.00)
GFR calc Af Amer: >60 mL/min (ref >60)
GFR calc non Af Amer: >60 mL/min (ref >60)
Glucose, Bld: 91 mg/dL (ref 70–99)
Potassium: 3.9 mmol/L (ref 3.5–5.1)
Sodium: 138 mmol/L (ref 135–145)
Total Bilirubin: 0.9 mg/dL (ref 0.3–1.2)
Total Protein: 7.3 g/dL (ref 6.5–8.1)

## 2019-04-18 LAB — CBC WITH DIFFERENTIAL/PLATELET
Abs Immature Granulocytes: 0.02 10*3/uL (ref 0.00–0.07)
Basophils Absolute: 0 10*3/uL (ref 0.0–0.1)
Basophils Relative: 1 %
Eosinophils Absolute: 0.1 10*3/uL (ref 0.0–0.5)
Eosinophils Relative: 2 %
HCT: 38.3 % (ref 36.0–46.0)
Hemoglobin: 13.2 g/dL (ref 12.0–15.0)
Immature Granulocytes: 0 %
Lymphocytes Relative: 28 %
Lymphs Abs: 1.4 10*3/uL (ref 0.7–4.0)
MCH: 30.7 pg (ref 26.0–34.0)
MCHC: 34.5 g/dL (ref 30.0–36.0)
MCV: 89.1 fL (ref 80.0–100.0)
Monocytes Absolute: 0.5 10*3/uL (ref 0.1–1.0)
Monocytes Relative: 9 %
Neutro Abs: 3.1 10*3/uL (ref 1.7–7.7)
Neutrophils Relative %: 60 %
Platelets: 334 10*3/uL (ref 150–400)
RBC: 4.3 MIL/uL (ref 3.87–5.11)
RDW: 12.7 % (ref 11.5–15.5)
WBC: 5.2 10*3/uL (ref 4.0–10.5)
nRBC: 0 % (ref 0.0–0.2)

## 2019-04-18 LAB — URINALYSIS, ROUTINE W REFLEX MICROSCOPIC
Bilirubin Urine: NEGATIVE
Glucose, UA: NEGATIVE mg/dL
Ketones, ur: NEGATIVE mg/dL
Leukocytes,Ua: NEGATIVE
Nitrite: NEGATIVE
Protein, ur: NEGATIVE mg/dL
Specific Gravity, Urine: 1.024 (ref 1.005–1.030)
pH: 6 (ref 5.0–8.0)

## 2019-04-18 LAB — PROTIME-INR
INR: 0.9 (ref 0.8–1.2)
Prothrombin Time: 12.3 seconds (ref 11.4–15.2)

## 2019-04-18 LAB — APTT: aPTT: 28 seconds (ref 24–36)

## 2019-04-18 LAB — SURGICAL PCR SCREEN
MRSA, PCR: NEGATIVE
Staphylococcus aureus: POSITIVE — AB

## 2019-04-18 NOTE — Progress Notes (Signed)
   04/18/19 1242  Complete When Order for Surgery Written:  Pre-op Surgical PCR Testing (All Areas )  Swab Result Positive? Staph Aureus  Patient Notified? Yes (Prescription called in at [facility])  Date Notified 04/18/19

## 2019-04-18 NOTE — Progress Notes (Signed)
PCP - Maudie Mercury Cardiologist - denies  Chest x-ray - 03/11/19 EKG - 03/11/19 Stress Test - denies ECHO - denies Cardiac Cath - denies   ERAS Protcol - yes clears until 0430 PRE-SURGERY Ensure or G2- pre-surgery ensure given  COVID TEST- 04/24/19 - quarantine instructions given   Anesthesia review: yes, frequent ED visits for chest pain, patient stated that her heat is ok according to the ed visits and the ed stated it is related to anxiety.  Patient stated that she has not had any more chest pain since her last ed visit in February   Patient denies shortness of breath, fever, cough and chest pain at PAT appointment   All instructions explained to the patient, with a verbal understanding of the material. Patient agrees to go over the instructions while at home for a better understanding. Patient also instructed to self quarantine after being tested for COVID-19. The opportunity to ask questions was provided.

## 2019-04-19 NOTE — Anesthesia Preprocedure Evaluation (Addendum)
Anesthesia Evaluation  Patient identified by MRN, date of birth, ID band Patient awake    Reviewed: Allergy & Precautions, NPO status , Patient's Chart, lab work & pertinent test results  Airway Mallampati: II  TM Distance: >3 FB Neck ROM: Full    Dental no notable dental hx. (+) Teeth Intact, Dental Advisory Given   Pulmonary neg pulmonary ROS, former smoker,    Pulmonary exam normal breath sounds clear to auscultation       Cardiovascular negative cardio ROS Normal cardiovascular exam Rhythm:Regular Rate:Normal     Neuro/Psych  Headaches, negative psych ROS   GI/Hepatic negative GI ROS, Neg liver ROS,   Endo/Other  negative endocrine ROS  Renal/GU negative Renal ROS     Musculoskeletal negative musculoskeletal ROS (+)   Abdominal (+) + obese,   Peds  Hematology negative hematology ROS (+)   Anesthesia Other Findings   Reproductive/Obstetrics negative OB ROS                           Anesthesia Physical Anesthesia Plan  ASA: III  Anesthesia Plan: General   Post-op Pain Management:    Induction: Intravenous  PONV Risk Score and Plan: 4 or greater and Treatment may vary due to age or medical condition, Ondansetron, Dexamethasone and Midazolam  Airway Management Planned: Video Laryngoscope Planned and Oral ETT  Additional Equipment: None  Intra-op Plan:   Post-operative Plan: Extubation in OR  Informed Consent: I have reviewed the patients History and Physical, chart, labs and discussed the procedure including the risks, benefits and alternatives for the proposed anesthesia with the patient or authorized representative who has indicated his/her understanding and acceptance.     Dental advisory given  Plan Discussed with: CRNA  Anesthesia Plan Comments: (Multiple ED visits for left shoulder/neck/chest pain. Benign cardiac workups. Pain felt due to cervical radiculopathy. Has  been evaluated by PCP with concordant findings. )      Anesthesia Quick Evaluation

## 2019-04-24 ENCOUNTER — Other Ambulatory Visit (HOSPITAL_COMMUNITY)
Admission: RE | Admit: 2019-04-24 | Discharge: 2019-04-24 | Disposition: A | Discharge status: Home / Self Care | Payer: Medicaid Other | Source: Ambulatory Visit | Attending: Orthopedic Surgery | Admitting: Orthopedic Surgery

## 2019-04-24 DIAGNOSIS — Z135 Encounter for screening for eye and ear disorders: Secondary | ICD-10-CM | POA: Diagnosis not present

## 2019-04-24 DIAGNOSIS — Z01812 Encounter for preprocedural laboratory examination: Secondary | ICD-10-CM | POA: Insufficient documentation

## 2019-04-24 DIAGNOSIS — H5213 Myopia, bilateral: Secondary | ICD-10-CM | POA: Diagnosis not present

## 2019-04-24 DIAGNOSIS — Z20822 Contact with and (suspected) exposure to covid-19: Secondary | ICD-10-CM | POA: Diagnosis not present

## 2019-04-24 DIAGNOSIS — H524 Presbyopia: Secondary | ICD-10-CM | POA: Diagnosis not present

## 2019-04-24 DIAGNOSIS — H52223 Regular astigmatism, bilateral: Secondary | ICD-10-CM | POA: Diagnosis not present

## 2019-04-24 LAB — SARS CORONAVIRUS 2 (TAT 6-24 HRS): SARS Coronavirus 2: NEGATIVE

## 2019-04-25 MED ORDER — DEXTROSE 5 % IV SOLN
3.0000 g | INTRAVENOUS | Status: AC
  Administered 2019-04-26: 3 g via INTRAVENOUS
  Filled 2019-04-25 (×2): qty 3000

## 2019-04-26 ENCOUNTER — Ambulatory Visit (HOSPITAL_COMMUNITY): Payer: Medicaid Other | Admitting: Physician Assistant

## 2019-04-26 ENCOUNTER — Ambulatory Visit (HOSPITAL_COMMUNITY): Payer: Medicaid Other | Admitting: Certified Registered"

## 2019-04-26 ENCOUNTER — Observation Stay (HOSPITAL_COMMUNITY)
Admission: RE | Admit: 2019-04-26 | Discharge: 2019-04-27 | Disposition: A | Discharge status: Home / Self Care | Payer: Medicaid Other | Attending: Orthopedic Surgery | Admitting: Orthopedic Surgery

## 2019-04-26 ENCOUNTER — Ambulatory Visit (HOSPITAL_COMMUNITY): Payer: Medicaid Other

## 2019-04-26 ENCOUNTER — Ambulatory Visit (HOSPITAL_COMMUNITY)
Admission: RE | Disposition: A | Discharge status: Home / Self Care | Payer: Self-pay | Source: Home / Self Care | Attending: Orthopedic Surgery

## 2019-04-26 ENCOUNTER — Encounter (HOSPITAL_COMMUNITY): Payer: Self-pay | Admitting: Orthopedic Surgery

## 2019-04-26 ENCOUNTER — Other Ambulatory Visit: Payer: Self-pay

## 2019-04-26 DIAGNOSIS — G992 Myelopathy in diseases classified elsewhere: Secondary | ICD-10-CM | POA: Diagnosis not present

## 2019-04-26 DIAGNOSIS — Z79899 Other long term (current) drug therapy: Secondary | ICD-10-CM | POA: Diagnosis not present

## 2019-04-26 DIAGNOSIS — M5000 Cervical disc disorder with myelopathy, unspecified cervical region: Secondary | ICD-10-CM | POA: Diagnosis not present

## 2019-04-26 DIAGNOSIS — M4322 Fusion of spine, cervical region: Secondary | ICD-10-CM | POA: Diagnosis not present

## 2019-04-26 DIAGNOSIS — M50022 Cervical disc disorder at C5-C6 level with myelopathy: Secondary | ICD-10-CM | POA: Diagnosis not present

## 2019-04-26 DIAGNOSIS — M5410 Radiculopathy, site unspecified: Secondary | ICD-10-CM | POA: Diagnosis present

## 2019-04-26 DIAGNOSIS — D649 Anemia, unspecified: Secondary | ICD-10-CM | POA: Insufficient documentation

## 2019-04-26 DIAGNOSIS — Z419 Encounter for procedure for purposes other than remedying health state, unspecified: Secondary | ICD-10-CM

## 2019-04-26 DIAGNOSIS — M4802 Spinal stenosis, cervical region: Principal | ICD-10-CM | POA: Insufficient documentation

## 2019-04-26 DIAGNOSIS — H5213 Myopia, bilateral: Secondary | ICD-10-CM | POA: Diagnosis not present

## 2019-04-26 DIAGNOSIS — Z87891 Personal history of nicotine dependence: Secondary | ICD-10-CM | POA: Diagnosis not present

## 2019-04-26 DIAGNOSIS — M5412 Radiculopathy, cervical region: Secondary | ICD-10-CM | POA: Insufficient documentation

## 2019-04-26 DIAGNOSIS — M50123 Cervical disc disorder at C6-C7 level with radiculopathy: Secondary | ICD-10-CM | POA: Diagnosis not present

## 2019-04-26 DIAGNOSIS — M50023 Cervical disc disorder at C6-C7 level with myelopathy: Secondary | ICD-10-CM | POA: Diagnosis not present

## 2019-04-26 HISTORY — PX: ANTERIOR CERVICAL DECOMPRESSION/DISCECTOMY FUSION 4 LEVELS: SHX5556

## 2019-04-26 SURGERY — ANTERIOR CERVICAL DECOMPRESSION/DISCECTOMY FUSION 4 LEVELS
Anesthesia: General

## 2019-04-26 MED ORDER — HYDROXYZINE HCL 50 MG/ML IM SOLN
50.0000 mg | Freq: Four times a day (QID) | INTRAMUSCULAR | Status: DC | PRN
  Administered 2019-04-26: 18:00:00 50 mg via INTRAMUSCULAR
  Filled 2019-04-26: qty 1

## 2019-04-26 MED ORDER — LIDOCAINE 2% (20 MG/ML) 5 ML SYRINGE
INTRAMUSCULAR | Status: DC | PRN
  Administered 2019-04-26: 100 mg via INTRAVENOUS

## 2019-04-26 MED ORDER — BISACODYL 5 MG PO TBEC: 5.0000 mg | DELAYED_RELEASE_TABLET | Freq: Every day | ORAL | Status: DC | PRN

## 2019-04-26 MED ORDER — SENNOSIDES-DOCUSATE SODIUM 8.6-50 MG PO TABS: 1.0000 | ORAL_TABLET | Freq: Every evening | ORAL | Status: DC | PRN

## 2019-04-26 MED ORDER — EPINEPHRINE PF 1 MG/ML IJ SOLN
INTRAMUSCULAR | Status: AC
  Filled 2019-04-26: qty 1

## 2019-04-26 MED ORDER — ACETAMINOPHEN 10 MG/ML IV SOLN
1000.0000 mg | Freq: Once | INTRAVENOUS | Status: DC | PRN
  Administered 2019-04-26: 1000 mg via INTRAVENOUS

## 2019-04-26 MED ORDER — ONDANSETRON HCL 4 MG/2ML IJ SOLN: 4.0000 mg | Freq: Once | INTRAMUSCULAR | Status: DC | PRN

## 2019-04-26 MED ORDER — ZOLPIDEM TARTRATE 5 MG PO TABS: 5.0000 mg | ORAL_TABLET | Freq: Every evening | ORAL | Status: DC | PRN

## 2019-04-26 MED ORDER — OXYCODONE-ACETAMINOPHEN 5-325 MG PO TABS
1.0000 | ORAL_TABLET | ORAL | Status: DC | PRN
  Administered 2019-04-26 – 2019-04-27 (×4): 2 via ORAL
  Filled 2019-04-26 (×5): qty 2

## 2019-04-26 MED ORDER — PHENYLEPHRINE 40 MCG/ML (10ML) SYRINGE FOR IV PUSH (FOR BLOOD PRESSURE SUPPORT)
PREFILLED_SYRINGE | INTRAVENOUS | Status: DC | PRN
  Administered 2019-04-26: 40 ug via INTRAVENOUS

## 2019-04-26 MED ORDER — ACETAMINOPHEN 325 MG PO TABS: 650.0000 mg | ORAL_TABLET | ORAL | Status: DC | PRN

## 2019-04-26 MED ORDER — PANTOPRAZOLE SODIUM 40 MG PO TBEC
40.0000 mg | DELAYED_RELEASE_TABLET | Freq: Every day | ORAL | Status: DC
  Administered 2019-04-26: 22:00:00 40 mg via ORAL

## 2019-04-26 MED ORDER — HYDROMORPHONE HCL 1 MG/ML IJ SOLN
INTRAMUSCULAR | Status: AC
  Filled 2019-04-26: qty 1

## 2019-04-26 MED ORDER — POVIDONE-IODINE 7.5 % EX SOLN: Freq: Once | CUTANEOUS | Status: DC

## 2019-04-26 MED ORDER — LACTATED RINGERS IV SOLN: INTRAVENOUS | Status: DC | PRN

## 2019-04-26 MED ORDER — FLEET ENEMA 7-19 GM/118ML RE ENEM: 1.0000 | ENEMA | Freq: Once | RECTAL | Status: DC | PRN

## 2019-04-26 MED ORDER — SODIUM CHLORIDE 0.9 % IV SOLN: 250.0000 mL | INTRAVENOUS | Status: DC

## 2019-04-26 MED ORDER — ONDANSETRON HCL 4 MG/2ML IJ SOLN
INTRAMUSCULAR | Status: AC
  Filled 2019-04-26: qty 2

## 2019-04-26 MED ORDER — ONDANSETRON HCL 4 MG/2ML IJ SOLN
INTRAMUSCULAR | Status: DC | PRN
  Administered 2019-04-26: 4 mg via INTRAVENOUS

## 2019-04-26 MED ORDER — MENTHOL 3 MG MT LOZG
1.0000 | LOZENGE | OROMUCOSAL | Status: DC | PRN
  Administered 2019-04-26: 3 mg via ORAL
  Filled 2019-04-26: qty 9

## 2019-04-26 MED ORDER — PROPOFOL 10 MG/ML IV BOLUS
INTRAVENOUS | Status: AC
  Filled 2019-04-26: qty 20

## 2019-04-26 MED ORDER — CHLORHEXIDINE GLUCONATE 4 % EX LIQD: 60.0000 mL | Freq: Once | CUTANEOUS | Status: DC

## 2019-04-26 MED ORDER — BUPIVACAINE HCL (PF) 0.25 % IJ SOLN
INTRAMUSCULAR | Status: AC
  Filled 2019-04-26: qty 30

## 2019-04-26 MED ORDER — ACETAMINOPHEN 650 MG RE SUPP: 650.0000 mg | RECTAL | Status: DC | PRN

## 2019-04-26 MED ORDER — DEXAMETHASONE SODIUM PHOSPHATE 10 MG/ML IJ SOLN
INTRAMUSCULAR | Status: DC | PRN
  Administered 2019-04-26: 10 mg via INTRAVENOUS

## 2019-04-26 MED ORDER — MIDAZOLAM HCL 5 MG/5ML IJ SOLN
INTRAMUSCULAR | Status: DC | PRN
  Administered 2019-04-26 (×2): 1 mg via INTRAVENOUS

## 2019-04-26 MED ORDER — DEXAMETHASONE SODIUM PHOSPHATE 10 MG/ML IJ SOLN
INTRAMUSCULAR | Status: AC
  Filled 2019-04-26: qty 1

## 2019-04-26 MED ORDER — ONDANSETRON HCL 4 MG PO TABS: 4.0000 mg | ORAL_TABLET | Freq: Four times a day (QID) | ORAL | Status: DC | PRN

## 2019-04-26 MED ORDER — OXYCODONE HCL 5 MG PO TABS: 5.0000 mg | ORAL_TABLET | Freq: Once | ORAL | Status: DC | PRN

## 2019-04-26 MED ORDER — MIDAZOLAM HCL 2 MG/2ML IJ SOLN
INTRAMUSCULAR | Status: AC
  Filled 2019-04-26: qty 2

## 2019-04-26 MED ORDER — CEFAZOLIN SODIUM-DEXTROSE 2-4 GM/100ML-% IV SOLN
2.0000 g | Freq: Three times a day (TID) | INTRAVENOUS | Status: AC
  Administered 2019-04-26 (×2): 2 g via INTRAVENOUS
  Filled 2019-04-26 (×2): qty 100

## 2019-04-26 MED ORDER — ROCURONIUM BROMIDE 50 MG/5ML IV SOSY
PREFILLED_SYRINGE | INTRAVENOUS | Status: DC | PRN
  Administered 2019-04-26 (×2): 20 mg via INTRAVENOUS
  Administered 2019-04-26: 40 mg via INTRAVENOUS

## 2019-04-26 MED ORDER — MEPERIDINE HCL 25 MG/ML IJ SOLN: 6.2500 mg | INTRAMUSCULAR | Status: DC | PRN

## 2019-04-26 MED ORDER — ACETAMINOPHEN 10 MG/ML IV SOLN
INTRAVENOUS | Status: AC
  Filled 2019-04-26: qty 100

## 2019-04-26 MED ORDER — DEXMEDETOMIDINE HCL 200 MCG/2ML IV SOLN
INTRAVENOUS | Status: DC | PRN
  Administered 2019-04-26: 8 ug via INTRAVENOUS

## 2019-04-26 MED ORDER — SUCCINYLCHOLINE CHLORIDE 200 MG/10ML IV SOSY
PREFILLED_SYRINGE | INTRAVENOUS | Status: DC | PRN
  Administered 2019-04-26: 100 mg via INTRAVENOUS

## 2019-04-26 MED ORDER — THROMBIN (RECOMBINANT) 20000 UNITS EX SOLR
CUTANEOUS | Status: AC
  Filled 2019-04-26: qty 20000

## 2019-04-26 MED ORDER — FERROUS SULFATE 325 (65 FE) MG PO TABS: 325.0000 mg | ORAL_TABLET | ORAL | Status: DC

## 2019-04-26 MED ORDER — SUCCINYLCHOLINE CHLORIDE 200 MG/10ML IV SOSY
PREFILLED_SYRINGE | INTRAVENOUS | Status: AC
  Filled 2019-04-26: qty 10

## 2019-04-26 MED ORDER — SODIUM CHLORIDE 0.9% FLUSH: 3.0000 mL | INTRAVENOUS | Status: DC | PRN

## 2019-04-26 MED ORDER — 0.9 % SODIUM CHLORIDE (POUR BTL) OPTIME
TOPICAL | Status: DC | PRN
  Administered 2019-04-26: 09:00:00 1000 mL

## 2019-04-26 MED ORDER — METHOCARBAMOL 1000 MG/10ML IJ SOLN
500.0000 mg | Freq: Four times a day (QID) | INTRAVENOUS | Status: DC | PRN
  Filled 2019-04-26: qty 5

## 2019-04-26 MED ORDER — THROMBIN 20000 UNITS EX KIT
PACK | CUTANEOUS | Status: DC | PRN
  Administered 2019-04-26: 09:00:00 20 mL via TOPICAL

## 2019-04-26 MED ORDER — PROPOFOL 10 MG/ML IV BOLUS
INTRAVENOUS | Status: DC | PRN
  Administered 2019-04-26: 170 mg via INTRAVENOUS

## 2019-04-26 MED ORDER — METHOCARBAMOL 500 MG PO TABS
500.0000 mg | ORAL_TABLET | Freq: Four times a day (QID) | ORAL | Status: DC | PRN
  Administered 2019-04-26 – 2019-04-27 (×3): 500 mg via ORAL
  Filled 2019-04-26 (×5): qty 1

## 2019-04-26 MED ORDER — LIDOCAINE 2% (20 MG/ML) 5 ML SYRINGE
INTRAMUSCULAR | Status: AC
  Filled 2019-04-26: qty 5

## 2019-04-26 MED ORDER — FENTANYL CITRATE (PF) 250 MCG/5ML IJ SOLN
INTRAMUSCULAR | Status: AC
  Filled 2019-04-26: qty 5

## 2019-04-26 MED ORDER — SODIUM CHLORIDE 0.9% FLUSH: 3.0000 mL | Freq: Two times a day (BID) | INTRAVENOUS | Status: DC

## 2019-04-26 MED ORDER — GABAPENTIN 300 MG PO CAPS
300.0000 mg | ORAL_CAPSULE | Freq: Two times a day (BID) | ORAL | Status: DC
  Administered 2019-04-26 (×2): 300 mg via ORAL
  Filled 2019-04-26 (×2): qty 1

## 2019-04-26 MED ORDER — PHENOL 1.4 % MT LIQD
1.0000 | OROMUCOSAL | Status: DC | PRN
  Administered 2019-04-26: 18:00:00 1 via OROMUCOSAL
  Filled 2019-04-26: qty 177

## 2019-04-26 MED ORDER — PHENYLEPHRINE HCL-NACL 10-0.9 MG/250ML-% IV SOLN
INTRAVENOUS | Status: DC | PRN
  Administered 2019-04-26: 25 ug/min via INTRAVENOUS

## 2019-04-26 MED ORDER — ROCURONIUM BROMIDE 10 MG/ML (PF) SYRINGE
PREFILLED_SYRINGE | INTRAVENOUS | Status: AC
  Filled 2019-04-26: qty 10

## 2019-04-26 MED ORDER — PANTOPRAZOLE SODIUM 40 MG IV SOLR: 40.0000 mg | Freq: Every day | INTRAVENOUS | Status: DC

## 2019-04-26 MED ORDER — FENTANYL CITRATE (PF) 100 MCG/2ML IJ SOLN
INTRAMUSCULAR | Status: DC | PRN
  Administered 2019-04-26 (×2): 50 ug via INTRAVENOUS
  Administered 2019-04-26: 100 ug via INTRAVENOUS
  Administered 2019-04-26: 50 ug via INTRAVENOUS

## 2019-04-26 MED ORDER — HYDROMORPHONE HCL 1 MG/ML IJ SOLN
0.2500 mg | INTRAMUSCULAR | Status: DC | PRN
  Administered 2019-04-26 (×3): 0.5 mg via INTRAVENOUS

## 2019-04-26 MED ORDER — OXYCODONE HCL 5 MG/5ML PO SOLN: 5.0000 mg | Freq: Once | ORAL | Status: DC | PRN

## 2019-04-26 MED ORDER — SUGAMMADEX SODIUM 200 MG/2ML IV SOLN
INTRAVENOUS | Status: DC | PRN
  Administered 2019-04-26: 300 mg via INTRAVENOUS

## 2019-04-26 MED ORDER — ALUM & MAG HYDROXIDE-SIMETH 200-200-20 MG/5ML PO SUSP: 30.0000 mL | Freq: Four times a day (QID) | ORAL | Status: DC | PRN

## 2019-04-26 MED ORDER — PHENYLEPHRINE 40 MCG/ML (10ML) SYRINGE FOR IV PUSH (FOR BLOOD PRESSURE SUPPORT)
PREFILLED_SYRINGE | INTRAVENOUS | Status: AC
  Filled 2019-04-26: qty 10

## 2019-04-26 MED ORDER — ONDANSETRON HCL 4 MG/2ML IJ SOLN: 4.0000 mg | Freq: Four times a day (QID) | INTRAMUSCULAR | Status: DC | PRN

## 2019-04-26 MED ORDER — BUPIVACAINE-EPINEPHRINE 0.25% -1:200000 IJ SOLN
INTRAMUSCULAR | Status: DC | PRN
  Administered 2019-04-26: 3 mL

## 2019-04-26 MED ORDER — DOCUSATE SODIUM 100 MG PO CAPS
100.0000 mg | ORAL_CAPSULE | Freq: Two times a day (BID) | ORAL | Status: DC
  Administered 2019-04-26 (×2): 100 mg via ORAL
  Filled 2019-04-26 (×2): qty 1

## 2019-04-26 SURGICAL SUPPLY — 80 items
AGENT HMST KT MTR STRL THRMB (HEMOSTASIS)
APL SKNCLS STERI-STRIP NONHPOA (GAUZE/BANDAGES/DRESSINGS) ×1
BENZOIN TINCTURE PRP APPL 2/3 (GAUZE/BANDAGES/DRESSINGS) ×3 IMPLANT
BIT DRILL NEURO 2X3.1 SFT TUCH (MISCELLANEOUS) ×1 IMPLANT
BIT DRILL SRG 14X2.2XFLT CHK (BIT) IMPLANT
BIT DRL SRG 14X2.2XFLT CHK (BIT) ×1
BLADE CLIPPER SURG (BLADE) ×3 IMPLANT
BLADE SURG 15 STRL LF DISP TIS (BLADE) ×1 IMPLANT
BLADE SURG 15 STRL SS (BLADE) ×3
BONE VIVIGEN FORMABLE 5.4CC (Bone Implant) ×3 IMPLANT
BUR MATCHSTICK NEURO 3.0 LAGG (BURR) IMPLANT
CARTRIDGE OIL MAESTRO DRILL (MISCELLANEOUS) ×1 IMPLANT
CLOSURE WOUND 1/2 X4 (GAUZE/BANDAGES/DRESSINGS) ×1
COVER MAYO STAND STRL (DRAPES) ×2 IMPLANT
COVER SURGICAL LIGHT HANDLE (MISCELLANEOUS) ×3 IMPLANT
COVER WAND RF STERILE (DRAPES) ×3 IMPLANT
DECANTER SPIKE VIAL GLASS SM (MISCELLANEOUS) ×3 IMPLANT
DIFFUSER DRILL AIR PNEUMATIC (MISCELLANEOUS) ×3 IMPLANT
DRAIN JACKSON RD 7FR 3/32 (WOUND CARE) IMPLANT
DRAPE C-ARM 42X72 X-RAY (DRAPES) ×3 IMPLANT
DRAPE POUCH INSTRU U-SHP 10X18 (DRAPES) ×3 IMPLANT
DRAPE SURG 17X23 STRL (DRAPES) ×9 IMPLANT
DRILL BIT SKYLINE 14MM (BIT) ×3
DRILL NEURO 2X3.1 SOFT TOUCH (MISCELLANEOUS) ×3
DURAPREP 26ML APPLICATOR (WOUND CARE) ×3 IMPLANT
ELECT COATED BLADE 2.86 ST (ELECTRODE) ×3 IMPLANT
ELECT REM PT RETURN 9FT ADLT (ELECTROSURGICAL) ×3
ELECTRODE REM PT RTRN 9FT ADLT (ELECTROSURGICAL) ×1 IMPLANT
EVACUATOR SILICONE 100CC (DRAIN) IMPLANT
GAUZE 4X4 16PLY RFD (DISPOSABLE) ×3 IMPLANT
GAUZE SPONGE 4X4 12PLY STRL (GAUZE/BANDAGES/DRESSINGS) ×3 IMPLANT
GLOVE BIO SURGEON STRL SZ7 (GLOVE) ×3 IMPLANT
GLOVE BIO SURGEON STRL SZ8 (GLOVE) ×3 IMPLANT
GLOVE BIOGEL PI IND STRL 7.0 (GLOVE) ×2 IMPLANT
GLOVE BIOGEL PI IND STRL 8 (GLOVE) ×1 IMPLANT
GLOVE BIOGEL PI INDICATOR 7.0 (GLOVE) ×4
GLOVE BIOGEL PI INDICATOR 8 (GLOVE) ×2
GOWN STRL REUS W/ TWL LRG LVL3 (GOWN DISPOSABLE) ×1 IMPLANT
GOWN STRL REUS W/ TWL XL LVL3 (GOWN DISPOSABLE) ×1 IMPLANT
GOWN STRL REUS W/TWL LRG LVL3 (GOWN DISPOSABLE) ×3
GOWN STRL REUS W/TWL XL LVL3 (GOWN DISPOSABLE) ×3
GRAFT BNE MATRIX VG FRMBL MD 5 (Bone Implant) IMPLANT
INTERLOCK LRDTC CRVCL VBR 6MM (Bone Implant) IMPLANT
INTERLOCK LRDTC CRVCL VBR 7MM (Bone Implant) IMPLANT
IV CATH 14GX2 1/4 (CATHETERS) ×3 IMPLANT
KIT BASIN OR (CUSTOM PROCEDURE TRAY) ×3 IMPLANT
KIT TURNOVER KIT B (KITS) ×3 IMPLANT
LORDOTIC CERVICAL VBR 6MM SM (Bone Implant) ×6 IMPLANT
LORDOTIC CERVICAL VBR 7MM SM (Bone Implant) ×3 IMPLANT
MANIFOLD NEPTUNE II (INSTRUMENTS) ×1 IMPLANT
NDL PRECISIONGLIDE 27X1.5 (NEEDLE) ×1 IMPLANT
NDL SPNL 20GX3.5 QUINCKE YW (NEEDLE) ×1 IMPLANT
NEEDLE PRECISIONGLIDE 27X1.5 (NEEDLE) ×3 IMPLANT
NEEDLE SPNL 20GX3.5 QUINCKE YW (NEEDLE) ×3 IMPLANT
NS IRRIG 1000ML POUR BTL (IV SOLUTION) ×3 IMPLANT
OIL CARTRIDGE MAESTRO DRILL (MISCELLANEOUS) ×3
PACK ORTHO CERVICAL (CUSTOM PROCEDURE TRAY) ×3 IMPLANT
PAD ARMBOARD 7.5X6 YLW CONV (MISCELLANEOUS) ×6 IMPLANT
PATTIES SURGICAL .5 X.5 (GAUZE/BANDAGES/DRESSINGS) ×2 IMPLANT
PATTIES SURGICAL .5 X1 (DISPOSABLE) IMPLANT
PIN DISTRACTION 14 (PIN) ×4 IMPLANT
PLATE SKYLINE 3 LVL 48MM (Plate) ×2 IMPLANT
POSITIONER HEAD DONUT 9IN (MISCELLANEOUS) ×3 IMPLANT
SCREW SKYLINE VAR OS 14MM (Screw) ×16 IMPLANT
SPONGE INTESTINAL PEANUT (DISPOSABLE) ×6 IMPLANT
SPONGE SURGIFOAM ABS GEL 100 (HEMOSTASIS) ×3 IMPLANT
STRIP CLOSURE SKIN 1/2X4 (GAUZE/BANDAGES/DRESSINGS) ×2 IMPLANT
SURGIFLO W/THROMBIN 8M KIT (HEMOSTASIS) IMPLANT
SUT MNCRL AB 4-0 PS2 18 (SUTURE) ×3 IMPLANT
SUT VIC AB 2-0 CT2 18 VCP726D (SUTURE) ×5 IMPLANT
SYR BULB IRRIGATION 50ML (SYRINGE) ×3 IMPLANT
SYR CONTROL 10ML LL (SYRINGE) ×6 IMPLANT
TAPE CLOTH 4X10 WHT NS (GAUZE/BANDAGES/DRESSINGS) ×3 IMPLANT
TAPE CLOTH SURG 4X10 WHT LF (GAUZE/BANDAGES/DRESSINGS) IMPLANT
TAPE UMBILICAL COTTON 1/8X30 (MISCELLANEOUS) ×3 IMPLANT
TOWEL GREEN STERILE (TOWEL DISPOSABLE) ×3 IMPLANT
TOWEL GREEN STERILE FF (TOWEL DISPOSABLE) ×3 IMPLANT
TRAY FOLEY MTR SLVR 16FR STAT (SET/KITS/TRAYS/PACK) ×3 IMPLANT
WATER STERILE IRR 1000ML POUR (IV SOLUTION) ×3 IMPLANT
YANKAUER SUCT BULB TIP NO VENT (SUCTIONS) ×3 IMPLANT

## 2019-04-26 NOTE — Op Note (Signed)
PATIENT NAME: [name]   MEDICAL RECORD NO.:   [id number]    DATE OF BIRTH: 08/27/1970   DATE OF PROCEDURE: 04/26/2019                               OPERATIVE REPORT     PREOPERATIVE DIAGNOSES: 1. Cervical myelopathy as well as left-sided cervical radiculopathy 2. Spinal stenosis spanning C4-C7.   POSTOPERATIVE DIAGNOSES: 1. Cervical myelopathy as well as left-sided cervical radiculopathy 2. Spinal stenosis spanning C4-C7.   PROCEDURE: 1. Anterior cervical decompression and fusion C4/5, C5/6, C6/7. 2. Placement of anterior instrumentation, C4-C7. 3. Insertion of interbody device x 3 (Titan intervertebral spacers). 4. Intraoperative use of fluoroscopy. 5. Use of morselized allograft - ViviGen.   SURGEON:  Phylliss Bob, MD   ASSISTANT:  Pricilla Holm, PA-C.   ANESTHESIA:  General endotracheal anesthesia.   COMPLICATIONS:  None.   DISPOSITION:  Stable.   ESTIMATED BLOOD LOSS:  Minimal.   INDICATIONS FOR SURGERY:  Briefly, [name] is a pleasant 49 y.o. year- old patient, who did present to me with progressive myelopathic symptoms, as well as left arm pain. The patient's MRI did reveal the findings noted above.  Given the patient's ongoing rather debilitating pain and lack of improvement with appropriate treatment measures, we did discuss proceeding with the procedure noted above.  The patient was fully aware of the risks and limitations of surgery as outlined in my preoperative note.   OPERATIVE DETAILS:  On 04/26/2019 the patient was brought to surgery and general endotracheal anesthesia was administered.  The patient was placed supine on the hospital bed. The neck was gently extended.  All bony prominences were meticulously padded.  The neck was prepped and draped in the usual sterile fashion.  At this point, I did make a left-sided transverse incision.  The platysma was incised.  A Smith-Robinson approach was used and the anterior spine was identified. A  self-retaining retractor was placed.  I then subperiosteally exposed the vertebral bodies from C4-C7.  Caspar pins were then placed into the C6 and C7 vertebral bodies and distraction was applied.  A thorough and complete C6-7 intervertebral diskectomy was performed.  The posterior longitudinal ligament was identified and entered using a nerve hook.  I then used #1 followed by #2 Kerrison to perform a thorough and complete intervertebral diskectomy.  The spinal canal was thoroughly decompressed, as was the right and left neuroforamen.  The endplates were then prepared and the appropriate-sized intervertebral spacer was then packed with ViviGen and tamped into position in the usual fashion.  The lower Caspar pin was then removed and placed into the C5 vertebral body and once again, distraction was applied across the C5-6 intervertebral space.  I then again performed a thorough and complete diskectomy, thoroughly decompressing the spinal canal and bilateral neuroforamena.  After preparing the endplates, the appropriate-sized intervertebral spacer was packed with ViviGen and tamped into position.  The lower Caspar pin was then removed and placed into the C4 vertebral body and once again, distraction was applied across the C4-5 intervertebral space.  I then again performed a thorough and complete diskectomy, thoroughly decompressing the spinal canal and bilateral neuroforamena.  After preparing the endplates, the appropriate-sized intervertebral spacer was packed with ViviGen and tamped into position.  The Caspar pins then were removed and bone wax was placed in their place.  The appropriate-sized anterior cervical plate was placed over the anterior spine.  14 mm variable angle screws were placed, 2 in each vertebral body from C4-C7 for a total of 8 vertebral body screws.  The screws were then locked to the plate using the Cam locking mechanism.  I was very pleased with the final fluoroscopic  images.  The wound was then irrigated.  The wound was then explored for any undue bleeding and there was no bleeding noted. The wound was then closed in layers using 2-0 Vicryl, followed by 4-0 Monocryl.  Benzoin and Steri-Strips were applied, followed by sterile dressing.  All instrument counts were correct at the termination of the procedure.   Of note, Pricilla Holm, PA-C, was my assistant throughout surgery, and did aid in retraction, suctioning, and closure from start to finish.     Phylliss Bob, MD

## 2019-04-26 NOTE — Anesthesia Postprocedure Evaluation (Signed)
Anesthesia Post Note  Patient: [name]  Procedure(s) Performed: ANTERIOR CERVICAL DECOMPRESSION FUSION CERVICAL 4-5, CERVICAL 5-6, CERVICAL 6-7 WITH INSTRUMENTATION AND ALLOGRAFT (N/A )     Patient location during evaluation: PACU Anesthesia Type: General Level of consciousness: awake and alert Pain management: pain level controlled Vital Signs Assessment: post-procedure vital signs reviewed and stable Respiratory status: spontaneous breathing, nonlabored ventilation, respiratory function stable and patient connected to nasal cannula oxygen Cardiovascular status: blood pressure returned to baseline and stable Postop Assessment: no apparent nausea or vomiting Anesthetic complications: no    Last Vitals:  Vitals:   04/26/19 1202 04/26/19 1241  BP: 139/77 (!) 150/82  Pulse: 74 74  Resp: 17 18  Temp: 36.5 C 36.9 C  SpO2: 97% 97%    Last Pain:  Vitals:   04/26/19 1402  TempSrc:   PainSc: 8                  Barnet Glasgow

## 2019-04-26 NOTE — Transfer of Care (Signed)
Immediate Anesthesia Transfer of Care Note  Patient: [name]  Procedure(s) Performed: ANTERIOR CERVICAL DECOMPRESSION FUSION CERVICAL 4-5, CERVICAL 5-6, CERVICAL 6-7 WITH INSTRUMENTATION AND ALLOGRAFT (N/A )  Patient Location: PACU  Anesthesia Type:General  Level of Consciousness: awake, oriented and patient cooperative  Airway & Oxygen Therapy: Patient Spontanous Breathing and Patient connected to nasal cannula oxygen  Post-op Assessment: Report given to RN, Post -op Vital signs reviewed and stable and Patient moving all extremities X 4  Post vital signs: Reviewed and stable  Last Vitals:  Vitals Value Taken Time  BP 162/94 04/26/19 1047  Temp    Pulse 86 04/26/19 1048  Resp 18 04/26/19 1048  SpO2 100 % 04/26/19 1048  Vitals shown include unvalidated device data.  Last Pain:  Vitals:   04/26/19 [id number]  TempSrc:   PainSc: 2          Complications: No apparent anesthesia complications

## 2019-04-26 NOTE — Anesthesia Procedure Notes (Addendum)
Procedure Name: Intubation Date/Time: 04/26/2019 7:40 AM Performed by: Orlie Dakin, CRNA Pre-anesthesia Checklist: Patient identified, Emergency Drugs available, Suction available and Patient being monitored Patient Re-evaluated:Patient Re-evaluated prior to induction Oxygen Delivery Method: Circle system utilized Preoxygenation: Pre-oxygenation with 100% oxygen Induction Type: IV induction, Rapid sequence and Cricoid Pressure applied Laryngoscope Size: Glidescope and 4 Grade View: Grade I Tube type: Oral Tube size: 7.0 mm Number of attempts: 1 Airway Equipment and Method: Stylet and Video-laryngoscopy Placement Confirmation: ETT inserted through vocal cords under direct vision,  positive ETCO2 and breath sounds checked- equal and bilateral Secured at: 22 cm Tube secured with: Tape Dental Injury: Teeth and Oropharynx as per pre-operative assessment  Comments: Head and neck positioned per patient prior to induction.  RSI due to C/O nausea in Short-Stay.  Glidescope used due to neck symptoms.  Minimal neck extension with DL.  4x4s bite block used.

## 2019-04-26 NOTE — H&P (Signed)
PREOPERATIVE H&P  Chief Complaint: Balance deterioration  HPI: [name] is a 49 y.o. female who presents with ongoing deterioration in balance  MRI reveals spinal cord compression and stenosis spanning C4-C7  Patient has failed multiple forms of conservative care and continues to have pain (see office notes for additional details regarding the patient's full course of treatment)  Past Medical History:  Diagnosis Date  . Anemia   . Fibroids, submucosal   . History of blood transfusion 02/2011   [facility] - ? 2-3 units transfused   Past Surgical History:  Procedure Laterality Date  . APPENDECTOMY    . BILATERAL SALPINGECTOMY Bilateral 04/16/2013   Procedure: BILATERAL SALPINGECTOMY;  Surgeon: Lavonia Drafts, MD;  Location: [facility];  Service: Gynecology;  Laterality: Bilateral;  . [place], 2002  . DILATION AND CURETTAGE OF UTERUS N/A 03/31/2012   Procedure: DILATATION AND CURETTAGE;  Surgeon: Emily Filbert, MD;  Location: [facility];  Service: Gynecology;  Laterality: N/A;  . LAPAROSCOPIC LYSIS OF ADHESIONS N/A 03/31/2012   Procedure: LAPAROSCOPIC LYSIS OF ADHESIONS;  Surgeon: Emily Filbert, MD;  Location: [facility];  Service: Gynecology;  Laterality: N/A;  . LAPAROSCOPY N/A 03/31/2012   Procedure: LAPAROSCOPY OPERATIVE;  Surgeon: Emily Filbert, MD;  Location: [facility];  Service: Gynecology;  Laterality: N/A;  . NOVASURE ABLATION N/A 03/31/2012   Procedure: NOVASURE ABLATION;  Surgeon: Emily Filbert, MD;  Location: [facility];  Service: Gynecology;  Laterality: N/A;  . ROBOTIC ASSISTED TOTAL HYSTERECTOMY N/A 04/16/2013   Procedure: ROBOTIC ASSISTED TOTAL HYSTERECTOMY;  Surgeon: Lavonia Drafts, MD;  Location: [facility];  Service: Gynecology;  Laterality: N/A;  . TUBAL LIGATION  2002  . WISDOM TOOTH EXTRACTION     Social History   Socioeconomic History  . Marital status: Single    Spouse name: Not on file  . Number of children: Not on file  . Years of education: Not on  file  . Highest education level: Not on file  Occupational History  . Not on file  Tobacco Use  . Smoking status: Former Smoker    Packs/day: 0.00    Types: Cigarettes    Quit date: 01/21/2011    Years since quitting: 8.2  . Smokeless tobacco: Never Used  Substance and Sexual Activity  . Alcohol use: Yes    Comment: social occasions  . Drug use: No  . Sexual activity: Not Currently    Birth control/protection: Condom, Surgical  Other Topics Concern  . Not on file  Social History Narrative  . Not on file   Social Determinants of Health   Financial Resource Strain:   . Difficulty of Paying Living Expenses:   Food Insecurity:   . Worried About Charity fundraiser in the Last Year:   . Arboriculturist in the Last Year:   Transportation Needs:   . Film/video editor (Medical):   Marland Kitchen Lack of Transportation (Non-Medical):   Physical Activity:   . Days of Exercise per Week:   . Minutes of Exercise per Session:   Stress:   . Feeling of Stress :   Social Connections:   . Frequency of Communication with Friends and Family:   . Frequency of Social Gatherings with Friends and Family:   . Attends Religious Services:   . Active Member of Clubs or Organizations:   . Attends Archivist Meetings:   Marland Kitchen Marital Status:    Family History  Problem Relation  Age of Onset  . Hypertension Mother   . Seizures Father   . Fibroids Sister    Allergies  Allergen Reactions  . Bee Venom Anaphylaxis  . Bactrim [Sulfamethoxazole-Trimethoprim] Itching  . Codeine Nausea And Vomiting   Prior to Admission medications   Medication Sig Start Date End Date Taking? Authorizing Provider  acetaminophen (TYLENOL) 500 MG tablet Take 1,000 mg by mouth every 6 (six) hours as needed for moderate pain or headache.   Yes [provider]  ferrous sulfate 325 (65 FE) MG tablet Take 1 tablet (325 mg total) by mouth every other day. 11/08/18 03/11/28 Yes Neva Seat, MD  gabapentin  (NEURONTIN) 300 MG capsule Take 1 capsule (300 mg total) by mouth 2 (two) times daily. 03/01/19  Yes Santos-Sanchez, Merlene Morse, MD  Menthol-Methyl Salicylate (SALONPAS PAIN RELIEF PATCH) [facility] Place 1 patch onto the skin daily as needed (pain).   Yes [provider]  azelastine (ASTELIN) 0.1 % nasal spray Place 1 spray into both nostrils 2 (two) times daily. Use in each nostril as directed Patient not taking: Reported on 03/05/2019 12/01/17   Shella Maxim, NP  benzonatate (TESSALON) 100 MG capsule Take 1 capsule (100 mg total) by mouth 3 (three) times daily as needed for cough. Patient not taking: Reported on 03/05/2019 11/11/18   Ward, Delice Bison, DO  cyclobenzaprine (FLEXERIL) 10 MG tablet Take 1 tablet (10 mg total) by mouth 2 (two) times daily as needed for muscle spasms. Patient not taking: Reported on 04/13/2019 03/12/19   Montine Circle, PA-C  DULoxetine (CYMBALTA) 20 MG capsule Take 1 capsule (20 mg total) by mouth daily. 03/15/19 11/10/19  Jean Rosenthal, MD  ibuprofen (ADVIL) 600 MG tablet Take 1 tablet (600 mg total) by mouth every 6 (six) hours as needed. Patient not taking: Reported on 03/05/2019 11/02/18   Sharion Balloon, NP  predniSONE (DELTASONE) 10 MG tablet 6,5,4,3,2,1 taper Patient not taking: Reported on 03/12/2019 03/05/19   Fransico Meadow, PA-C     All other systems have been reviewed and were otherwise negative with the exception of those mentioned in the HPI and as above.  Physical Exam: Vitals:   04/26/19 0541  BP: (!) 144/79  Pulse: 68  Resp: 18  Temp: 98.4 F (36.9 C)  SpO2: 99%    There is no height or weight on file to calculate BMI.  General: Alert, no acute distress Cardiovascular: No pedal edema Respiratory: No cyanosis, no use of accessory musculature Skin: No lesions in the area of chief complaint Neurologic: Sensation intact distally Psychiatric: Patient is competent for consent with normal mood and affect Lymphatic: No axillary or cervical  lymphadenopathy   Assessment/Plan: PROGRESSIVE CERVICAL MYELOPATHY WITH SPINAL CORD COMPRESSION Plan for Procedure(s): ANTERIOR CERVICAL DECOMPRESSION FUSION CERVICAL 4-5, CERVICAL 5-6, CERVICAL 6-7 WITH INSTRUMENTATION AND ALLOGRAFT   Norva Karvonen, MD 04/26/2019 6:27 AM

## 2019-04-27 ENCOUNTER — Encounter: Payer: Self-pay | Admitting: *Deleted

## 2019-04-27 DIAGNOSIS — M4802 Spinal stenosis, cervical region: Secondary | ICD-10-CM | POA: Diagnosis not present

## 2019-04-27 MED ORDER — METHOCARBAMOL 500 MG PO TABS: 500.0000 mg | ORAL_TABLET | Freq: Four times a day (QID) | ORAL | 1 refills | Status: DC | PRN

## 2019-04-27 MED ORDER — OXYCODONE-ACETAMINOPHEN 5-325 MG PO TABS: 1.0000 | ORAL_TABLET | ORAL | 0 refills | Status: AC | PRN

## 2019-04-27 MED ORDER — ONDANSETRON HCL 4 MG PO TABS: 4.0000 mg | ORAL_TABLET | Freq: Three times a day (TID) | ORAL | Status: DC | PRN

## 2019-04-27 MED FILL — Thrombin (Recombinant) For Soln 20000 Unit: CUTANEOUS | Qty: 1 | Status: AC

## 2019-04-27 NOTE — Progress Notes (Signed)
    Patient doing well Po day 1, improving PO neck pain as expected and improving throat soreness, eating and drinking well. NL b/b function, has been up walking. Resolved L arm pain and hand symptoms    Physical Exam: Vitals:   04/26/19 2329 04/27/19 0603  BP: (!) 153/70 (!) 165/97  Pulse: 63 63  Resp: 18 18  Temp: 98 F (36.7 C) 98.3 F (36.8 C)  SpO2: 100% 100%    Dressing in place, CDI, hard collar worn appropriately, sitting up comfortably in bed NVI  POD #1 s/p C4-7 ACDF doing well with resolved L arm pain and expected PO neck pain  - encourage ambulation - Percocet for pain, robaxin for muscle spasms - likely d/c home today with f/u in 2 weeks

## 2019-04-27 NOTE — Progress Notes (Signed)
Patient is discharged from room 3C04 at this time. Alert and in stable condition. IV site d/c'd and instructions read to patient and daughter with understanding verbalized. Left unit via wheelchair with all belongings at side 

## 2019-04-30 ENCOUNTER — Encounter (HOSPITAL_COMMUNITY): Payer: Self-pay | Admitting: Emergency Medicine

## 2019-04-30 ENCOUNTER — Emergency Department (HOSPITAL_COMMUNITY)
Admission: EM | Admit: 2019-04-30 | Discharge: 2019-05-01 | Disposition: A | Discharge status: Home / Self Care | Payer: Medicaid Other | Attending: Emergency Medicine | Admitting: Emergency Medicine

## 2019-04-30 DIAGNOSIS — Z5321 Procedure and treatment not carried out due to patient leaving prior to being seen by health care provider: Secondary | ICD-10-CM | POA: Diagnosis not present

## 2019-04-30 DIAGNOSIS — R519 Headache, unspecified: Secondary | ICD-10-CM | POA: Insufficient documentation

## 2019-04-30 LAB — BASIC METABOLIC PANEL
Anion gap: 9 (ref 5–15)
BUN: 13 mg/dL (ref 6–20)
CO2: 26 mmol/L (ref 22–32)
Calcium: 9 mg/dL (ref 8.9–10.3)
Chloride: 104 mmol/L (ref 98–111)
Creatinine, Ser: 0.62 mg/dL (ref 0.44–1.00)
GFR calc Af Amer: >60 mL/min (ref >60)
GFR calc non Af Amer: >60 mL/min (ref >60)
Glucose, Bld: 94 mg/dL (ref 70–99)
Potassium: 4 mmol/L (ref 3.5–5.1)
Sodium: 139 mmol/L (ref 135–145)

## 2019-04-30 LAB — CBC
HCT: 39.2 % (ref 36.0–46.0)
Hemoglobin: 13.2 g/dL (ref 12.0–15.0)
MCH: 29.5 pg (ref 26.0–34.0)
MCHC: 33.7 g/dL (ref 30.0–36.0)
MCV: 87.7 fL (ref 80.0–100.0)
Platelets: 291 10*3/uL (ref 150–400)
RBC: 4.47 MIL/uL (ref 3.87–5.11)
RDW: 12.8 % (ref 11.5–15.5)
WBC: 6.1 10*3/uL (ref 4.0–10.5)
nRBC: 0 % (ref 0.0–0.2)

## 2019-04-30 NOTE — ED Triage Notes (Signed)
Pt in POV, reports severe HA, N/V X1 day. Recent cervical decompression/fusion 4/8

## 2019-05-01 NOTE — ED Notes (Signed)
Pt called for vital signs x3 with no response.

## 2019-05-03 ENCOUNTER — Other Ambulatory Visit: Payer: Self-pay | Admitting: Internal Medicine

## 2019-05-03 NOTE — Discharge Summary (Signed)
Patient ID: [place] MRN: [id number] DOB/AGE: Jun 25, 1970 49 y.o.  Admit date: 04/26/2019 Discharge date: 04/27/2019  Admission Diagnoses:  Active Problems:   Radiculopathy   Discharge Diagnoses:  Same  Past Medical History:  Diagnosis Date   Anemia    Fibroids, submucosal    History of blood transfusion 02/2011   [facility] - ? 2-3 units transfused    Surgeries: Procedure(s): ANTERIOR CERVICAL DECOMPRESSION FUSION CERVICAL 4-5, CERVICAL 5-6, CERVICAL 6-7 WITH INSTRUMENTATION AND ALLOGRAFT on 04/26/2019   Consultants: none  Discharged Condition: Improved  Hospital Course: [name] is an 49 y.o. female who was admitted 04/26/2019 for operative treatment of radiculopathy. Patient has severe unremitting pain that affects sleep, daily activities, and work/hobbies. After pre-op clearance the patient was taken to the operating room on 04/26/2019 and underwent  Procedure(s): ANTERIOR CERVICAL DECOMPRESSION FUSION CERVICAL 4-5, CERVICAL 5-6, CERVICAL 6-7 WITH INSTRUMENTATION AND ALLOGRAFT.    Patient was given perioperative antibiotics:  Anti-infectives (From admission, onward)   Start     Dose/Rate Route Frequency Ordered Stop   04/26/19 1600  ceFAZolin (ANCEF) IVPB 2g/100 mL premix     2 g 200 mL/hr over 30 Minutes Intravenous Every 8 hours 04/26/19 1229 04/27/19 0850   04/26/19 0600  ceFAZolin (ANCEF) 3 g in dextrose 5 % 50 mL IVPB     3 g 100 mL/hr over 30 Minutes Intravenous On call to O.R. 04/25/19 0726 04/26/19 1332       Patient was given sequential compression devices, early ambulation to prevent DVT.  Patient benefited maximally from hospital stay and there were no complications.    Recent vital signs: BP 134/85    Pulse 70    Temp 98.7 F (37.1 C) (Oral)    Resp 18    Ht 5\' 5"  (1.651 m)    Wt 110.2 kg    LMP 03/31/2013 (Exact Date)    SpO2 100%    BMI 40.44 kg/m    Discharge Medications:   Allergies as of 04/27/2019      Reactions   Bee Venom Anaphylaxis     Bactrim [sulfamethoxazole-trimethoprim] Itching   Codeine Nausea And Vomiting      Medication List    TAKE these medications   acetaminophen 500 MG tablet Commonly known as: TYLENOL Take 1,000 mg by mouth every 6 (six) hours as needed for moderate pain or headache.   azelastine 0.1 % nasal spray Commonly known as: ASTELIN Place 1 spray into both nostrils 2 (two) times daily. Use in each nostril as directed   benzonatate 100 MG capsule Commonly known as: TESSALON Take 1 capsule (100 mg total) by mouth 3 (three) times daily as needed for cough.   cyclobenzaprine 10 MG tablet Commonly known as: FLEXERIL Take 1 tablet (10 mg total) by mouth 2 (two) times daily as needed for muscle spasms.   DULoxetine 20 MG capsule Commonly known as: Cymbalta Take 1 capsule (20 mg total) by mouth daily.   ferrous sulfate 325 (65 FE) MG tablet Take 1 tablet (325 mg total) by mouth every other day.   gabapentin 300 MG capsule Commonly known as: NEURONTIN Take 1 capsule (300 mg total) by mouth 2 (two) times daily.   methocarbamol 500 MG tablet Commonly known as: ROBAXIN Take 1 tablet (500 mg total) by mouth every 6 (six) hours as needed for muscle spasms.   oxyCODONE-acetaminophen 5-325 MG tablet Commonly known as: PERCOCET/ROXICET Take 1-2 tablets by mouth every 4 (four) hours as needed  for up to 7 days for moderate pain or severe pain.   Salonpas Pain Relief Patch Ptch Place 1 patch onto the skin daily as needed (pain).       Diagnostic Studies: DG Cervical Spine 2-3 Views  Result Date: 04/26/2019 CLINICAL DATA:  Cervical fusion EXAM: DG C-ARM 1-60 MIN; CERVICAL SPINE - 2-3 VIEW COMPARISON:  01/13/2019 FLUOROSCOPY TIME:  Fluoroscopy Time:  20 seconds Radiation Exposure Index (if provided by the fluoroscopic device): Not available Number of Acquired Spot Images: 8 FINDINGS: Initial lateral film of the cervical spine demonstrates a surgical instrument at the C5-6 level anteriorly.  Subsequent film shows a needle within the anterior aspect of the C5-6 disc space. Interbody fusion was then performed at C4-5, C5-6 and C6-7 with anterior fixation. IMPRESSION: Cervical fusion from C4-C7. Electronically Signed   By: Inez Catalina M.D.   On: 04/26/2019 10:41   DG C-Arm 1-60 Min  Result Date: 04/26/2019 CLINICAL DATA:  Cervical fusion EXAM: DG C-ARM 1-60 MIN; CERVICAL SPINE - 2-3 VIEW COMPARISON:  01/13/2019 FLUOROSCOPY TIME:  Fluoroscopy Time:  20 seconds Radiation Exposure Index (if provided by the fluoroscopic device): Not available Number of Acquired Spot Images: 8 FINDINGS: Initial lateral film of the cervical spine demonstrates a surgical instrument at the C5-6 level anteriorly. Subsequent film shows a needle within the anterior aspect of the C5-6 disc space. Interbody fusion was then performed at C4-5, C5-6 and C6-7 with anterior fixation. IMPRESSION: Cervical fusion from C4-C7. Electronically Signed   By: Inez Catalina M.D.   On: 04/26/2019 10:41    Disposition: Discharge disposition: 01-Home or Self Care       Discharge Instructions    Discharge patient   Complete by: As directed    Discharge disposition: 01-Home or Self Care   Discharge patient date: 04/27/2019      POD #1 s/p C4-7 ACDF doing well with resolved L arm pain and expected PO neck pain  - encourage ambulation - Percocet for pain, robaxin for muscle spasms -Scripts for pain sent to pharmacy electronically  -D/C instructions sheet printed and in chart -D/C today  -F/U in office 2 weeks   Signed: Lennie Muckle Harmon Bommarito 05/03/2019, 2:14 PM

## 2019-05-09 DIAGNOSIS — Z9889 Other specified postprocedural states: Secondary | ICD-10-CM | POA: Diagnosis not present

## 2019-05-22 DIAGNOSIS — H5213 Myopia, bilateral: Secondary | ICD-10-CM | POA: Diagnosis not present

## 2019-05-22 DIAGNOSIS — H52223 Regular astigmatism, bilateral: Secondary | ICD-10-CM | POA: Diagnosis not present

## 2019-06-05 DIAGNOSIS — M4802 Spinal stenosis, cervical region: Secondary | ICD-10-CM | POA: Diagnosis not present

## 2019-06-12 DIAGNOSIS — Z23 Encounter for immunization: Secondary | ICD-10-CM | POA: Diagnosis not present

## 2019-07-02 ENCOUNTER — Ambulatory Visit: Payer: Medicaid Other | Admitting: Internal Medicine

## 2019-07-02 ENCOUNTER — Encounter: Payer: Self-pay | Admitting: Internal Medicine

## 2019-07-02 ENCOUNTER — Other Ambulatory Visit: Payer: Self-pay

## 2019-07-02 DIAGNOSIS — G44209 Tension-type headache, unspecified, not intractable: Secondary | ICD-10-CM

## 2019-07-02 MED ORDER — ACETAMINOPHEN-CAFFEINE 500-65 MG PO TABS: 1.0000 | ORAL_TABLET | Freq: Four times a day (QID) | ORAL | 0 refills | Status: DC | PRN

## 2019-07-02 NOTE — Assessment & Plan Note (Addendum)
Patient with PMH of headaches presents to the clinic. Patient states that she has had two headaches, roughly a month apart after her   Anterior cervical decompression fusion of cervical 4-5, 5-6, and 6-7 on 04/2019.   She states that these two episodes occurred between the hours of 4:00 p.m. to 6:00 p.m. and states that the pain occurs "like a band" around her head. She characterizes the pain as " gripping and sometimes burning." She rates her pain as a 8.5/10 with no radiation. Her headaches are aggravated by light, and alleviated to 0/10 with two tylenol. She states that one of the headaches caused minor nausea, but no vomiting. She denies being on any other pain medications apart from tylenol, and has been off of her flexeril. She denies unintentional weight loss, nighttime headaches, imbalance, or visual auras.   Assessment:  Patient presents to the clinic with decrease in her headaches from her last presentation. Through description it appears more likely that she is having tension headaches given bandlike distribution and description, and rapid alleviation with tylenol. No deficits observed during the physical examination. Patient will continue to monitor headaches at home and call the clinic if headache begins to change from its normal characterization.   Plan:  - Continue Tylenol.  - Order Excedrin PRN to be used if headache resistant to tylenol.

## 2019-07-02 NOTE — Progress Notes (Signed)
   CC: Headaches  HPI:  [name] is a 49 y.o. with a PMH noted below, who presents to the clinic for Headaches. To see the management of her acute and chronic conditions, please see the attached A&P under the encounters tab.   Past Medical History:  Diagnosis Date  . Anemia   . Fibroids, submucosal   . History of blood transfusion 02/2011   [facility] - ? 2-3 units transfused   Review of Systems:   Review of Systems  Constitutional: Negative for diaphoresis, fever, malaise/fatigue and weight loss.  HENT: Negative for sinus pain.   Eyes: Negative for blurred vision, double vision, photophobia and pain.  Cardiovascular: Negative for palpitations.  Gastrointestinal: Positive for nausea. Negative for abdominal pain, constipation, diarrhea, heartburn and vomiting.  Neurological: Positive for headaches. Negative for dizziness, sensory change, loss of consciousness and weakness.    Physical Exam:  There were no vitals filed for this visit. Physical Exam Vitals reviewed.  Constitutional:      Appearance: Normal appearance. She is obese.  Eyes:     General: No scleral icterus.       Right eye: No discharge.        Left eye: No discharge.     Extraocular Movements: Extraocular movements intact.     Conjunctiva/sclera: Conjunctivae normal.     Pupils: Pupils are equal, round, and reactive to light.  Cardiovascular:     Rate and Rhythm: Normal rate and regular rhythm.     Pulses: Normal pulses.     Heart sounds: Normal heart sounds. No murmur heard.  No friction rub. No gallop.   Abdominal:     General: Abdomen is flat. Bowel sounds are normal.     Palpations: Abdomen is soft.  Musculoskeletal:        General: No swelling or tenderness.     Right lower leg: No edema.     Left lower leg: No edema.  Neurological:     Mental Status: She is alert.     Assessment & Plan:   See Encounters Tab for problem based charting.  Patient discussed with Dr. Philipp Ovens

## 2019-07-02 NOTE — Patient Instructions (Addendum)
To [name],   It was a pleasure meeting you today. Today we discussed your headaches. As described by your history, and in light of your recent surgery, you likely have "tension" headaches. I will prescribe you Excedrin. Please only take as needed if your tylenol does not alleviate your pain. Continue to monitor your headaches, and please call the office if they appear to be changing. We will see you back in 4 months. Have a great day!  Sincerely,  Maudie Mercury, MD  Tension Headache, Adult A tension headache is pain, pressure, or aching in your head. Tension headaches can last from 30 minutes to several days. Follow these instructions at home: Managing pain  Take over-the-counter and prescription medicines only as told by your doctor.  When you have a headache, lie down in a dark, quiet room.  If told, put ice on your head and neck: ? Put ice in a plastic bag. ? Place a towel between your skin and the bag. ? Leave the ice on for 20 minutes, 2-3 times a day.  If told, put heat on the back of your neck. Do this as often as your doctor tells you to. Use the kind of heat that your doctor recommends, such as a moist heat pack or a heating pad. ? Place a towel between your skin and the heat. ? Leave the heat on for 20-30 minutes. ? Remove the heat if your skin turns bright red. Eating and drinking  Eat meals on a regular schedule.  Watch how much alcohol you drink: ? If you are a woman and are not pregnant, do not drink more than 1 drink a day. ? If you are a man, do not drink more than 2 drinks a day.  Drink enough fluid to keep your pee (urine) pale yellow.  Do not use a lot of caffeine, or stop using caffeine. Lifestyle  Get enough sleep. Get 7-9 hours of sleep each night. Or get the amount of sleep that your doctor tells you to.  At bedtime, remove all electronic devices from your room. Examples of electronic devices are computers, phones, and tablets.  Find ways to  lessen your stress. Some things that can lessen stress are: ? Exercise. ? Deep breathing. ? Yoga. ? Music. ? Positive thoughts.  Sit up straight. Do not tighten (tense) your muscles.  Do not use any products that have nicotine or tobacco in them, such as cigarettes and e-cigarettes. If you need help quitting, ask your doctor. General instructions   Keep all follow-up visits as told by your doctor. This is important.  Avoid things that can bring on headaches. Keep a journal to find out if certain things bring on headaches. For example, write down: ? What you eat and drink. ? How much sleep you get. ? Any change to your diet or medicines. Contact a doctor if:  Your headache does not get better.  Your headache comes back.  You have a headache and sounds, light, or smells bother you.  You feel sick to your stomach (nauseous) or you throw up (vomit).  Your stomach hurts. Get help right away if:  You suddenly get a very bad headache along with any of these: ? A stiff neck. ? Feeling sick to your stomach. ? Throwing up. ? Feeling weak. ? Trouble seeing. ? Feeling short of breath. ? A rash. ? Feeling unusually sleepy. ? Trouble speaking. ? Pain in your eye or ear. ? Trouble walking or balancing. ?  Feeling like you will pass out (faint). ? Passing out. Summary  A tension headache is pain, pressure, or aching in your head.  Tension headaches can last from 30 minutes to several days.  Lifestyle changes and medicines may help relieve pain. This information is not intended to replace advice given to you by your health care provider. Make sure you discuss any questions you have with your health care provider. Document Revised: 11/01/2018 Document Reviewed: 04/16/2016 Elsevier Patient Education  [place].

## 2019-07-03 ENCOUNTER — Encounter: Payer: Self-pay | Admitting: Internal Medicine

## 2019-07-03 NOTE — Progress Notes (Signed)
Internal Medicine Clinic Attending  Case discussed with Dr. Winters at the time of the visit.  We reviewed the resident's history and exam and pertinent patient test results.  I agree with the assessment, diagnosis, and plan of care documented in the resident's note.  

## 2019-07-08 ENCOUNTER — Ambulatory Visit (HOSPITAL_COMMUNITY)
Admission: EM | Admit: 2019-07-08 | Discharge: 2019-07-08 | Disposition: A | Discharge status: Home / Self Care | Payer: Medicaid Other

## 2019-07-08 ENCOUNTER — Encounter (HOSPITAL_COMMUNITY): Payer: Self-pay

## 2019-07-08 ENCOUNTER — Emergency Department (HOSPITAL_COMMUNITY)
Admission: EM | Admit: 2019-07-08 | Discharge: 2019-07-08 | Disposition: A | Discharge status: Home / Self Care | Payer: Medicaid Other | Attending: Emergency Medicine | Admitting: Emergency Medicine

## 2019-07-08 ENCOUNTER — Other Ambulatory Visit: Payer: Self-pay

## 2019-07-08 ENCOUNTER — Emergency Department (HOSPITAL_COMMUNITY): Payer: Medicaid Other

## 2019-07-08 DIAGNOSIS — R030 Elevated blood-pressure reading, without diagnosis of hypertension: Secondary | ICD-10-CM | POA: Diagnosis not present

## 2019-07-08 DIAGNOSIS — R519 Headache, unspecified: Secondary | ICD-10-CM | POA: Insufficient documentation

## 2019-07-08 DIAGNOSIS — Z79899 Other long term (current) drug therapy: Secondary | ICD-10-CM | POA: Diagnosis not present

## 2019-07-08 DIAGNOSIS — Z87891 Personal history of nicotine dependence: Secondary | ICD-10-CM | POA: Diagnosis not present

## 2019-07-08 DIAGNOSIS — R110 Nausea: Secondary | ICD-10-CM | POA: Diagnosis not present

## 2019-07-08 LAB — I-STAT BETA HCG BLOOD, ED (MC, WL, AP ONLY): I-stat hCG, quantitative: <5 m[IU]/mL (ref <5)

## 2019-07-08 LAB — URINALYSIS, ROUTINE W REFLEX MICROSCOPIC
Bilirubin Urine: NEGATIVE
Glucose, UA: NEGATIVE mg/dL
Ketones, ur: NEGATIVE mg/dL
Leukocytes,Ua: NEGATIVE
Nitrite: NEGATIVE
Protein, ur: NEGATIVE mg/dL
Specific Gravity, Urine: 1.02 (ref 1.005–1.030)
pH: 5 (ref 5.0–8.0)

## 2019-07-08 LAB — CBC
HCT: 34.4 % — ABNORMAL LOW (ref 36.0–46.0)
Hemoglobin: 11.9 g/dL — ABNORMAL LOW (ref 12.0–15.0)
MCH: 30.6 pg (ref 26.0–34.0)
MCHC: 34.6 g/dL (ref 30.0–36.0)
MCV: 88.4 fL (ref 80.0–100.0)
Platelets: 245 10*3/uL (ref 150–400)
RBC: 3.89 MIL/uL (ref 3.87–5.11)
RDW: 15.2 % (ref 11.5–15.5)
WBC: 5.7 10*3/uL (ref 4.0–10.5)
nRBC: 0 % (ref 0.0–0.2)

## 2019-07-08 LAB — BASIC METABOLIC PANEL
Anion gap: 8 (ref 5–15)
BUN: 14 mg/dL (ref 6–20)
CO2: 22 mmol/L (ref 22–32)
Calcium: 9.2 mg/dL (ref 8.9–10.3)
Chloride: 109 mmol/L (ref 98–111)
Creatinine, Ser: 0.63 mg/dL (ref 0.44–1.00)
GFR calc Af Amer: >60 mL/min (ref >60)
GFR calc non Af Amer: >60 mL/min (ref >60)
Glucose, Bld: 99 mg/dL (ref 70–99)
Potassium: 4.1 mmol/L (ref 3.5–5.1)
Sodium: 139 mmol/L (ref 135–145)

## 2019-07-08 LAB — CBG MONITORING, ED: Glucose-Capillary: 64 mg/dL — ABNORMAL LOW (ref 70–99)

## 2019-07-08 MED ORDER — IBUPROFEN 800 MG PO TABS: 800.0000 mg | ORAL_TABLET | Freq: Once | ORAL | Status: DC

## 2019-07-08 MED ORDER — ONDANSETRON 4 MG PO TBDP: 4.0000 mg | ORAL_TABLET | Freq: Once | ORAL | Status: DC

## 2019-07-08 MED ORDER — SODIUM CHLORIDE 0.9% FLUSH: 3.0000 mL | Freq: Once | INTRAVENOUS | Status: DC

## 2019-07-08 NOTE — Discharge Instructions (Addendum)
You have been treated for a headache.  I want you to alternate between taking ibuprofen and Tylenol for pain.  For example take ibuprofen and wait 6 hours then take Tylenol and wait another 6 hours then repeat.  Please follow dosing instructions on the back of the bottle.  You may also continue to use over-the-counter pain medication+ caffeine if that has been working for you.  When you have a migraine I want you to step away from work and put yourself in a dark room as this can help alleviate your pain.  I want you to follow-up with your primary care doctor for further evaluation management of your migraines.  I want to come back to the emergency department if you develop uncontrolled headaches, blurry or change in vision, weakness or numbness in your arms or legs, chest pain, shortness of breath is the symptoms require further evaluation and management.

## 2019-07-08 NOTE — ED Provider Notes (Addendum)
[facility] EMERGENCY DEPARTMENT Provider Note   CSN: [id number] Arrival date & time: 07/08/19  1717     History Chief Complaint  Patient presents with  . Weakness  . Nausea    [name] is a 49 y.o. female.  HPI   Patient presents to the emergency department with chief complaint of headache and nausea.  Patient explains that she has experienced this headache for last 2 days and has felt intermittent nausea.  Patient explains that these headaches are episodic in nature, they are sharp and she feels pain all around her head.  She denies episodes of vomiting but has felt intermittent nausea especially when her headaches come on.  she also admits that this morning around 9 AM she felt like her right side of her body felt weaker but has gotten better.  She denies slurred speech, change in her vision, shortness of breath, chest pain, numbness or tingling in her arms or legs.  Patient has a history of migraines and recently had a spine surgery 2 months ago.  Since her surgery she has had these headaches that go away when she takes over-the-counter pain meds that are mixed with caffeine.  Patient does not have any significant medical history, she does not take any medication on a daily basis, denies drug use, alcohol use, does not smoke tobacco products.  Past Medical History:  Diagnosis Date  . Anemia   . Fibroids, submucosal   . History of blood transfusion 02/2011   [facility] - ? 2-3 units transfused    Patient Active Problem List   Diagnosis Date Noted  . Radiculopathy 04/26/2019  . Cervical myelopathy ([place]) 03/07/2019  . Weakness 03/01/2019  . Headache 02/19/2019  . Shoulder pain, left 12/07/2018  . Iron deficiency anemia 11/07/2018  . Elevated blood pressure reading 11/07/2018  . Preventative health care 11/07/2018  . Postoperative state 04/16/2013  . Hematometra 01/09/2013  . Menorrhagia 01/09/2013  . Chronic pelvic pain in female 01/09/2013    Past  Surgical History:  Procedure Laterality Date  . ANTERIOR CERVICAL DECOMPRESSION/DISCECTOMY FUSION 4 LEVELS N/A 04/26/2019   Procedure: ANTERIOR CERVICAL DECOMPRESSION FUSION CERVICAL 4-5, CERVICAL 5-6, CERVICAL 6-7 WITH INSTRUMENTATION AND ALLOGRAFT;  Surgeon: Phylliss Bob, MD;  Location: [facility];  Service: Orthopedics;  Laterality: N/A;  . APPENDECTOMY    . BILATERAL SALPINGECTOMY Bilateral 04/16/2013   Procedure: BILATERAL SALPINGECTOMY;  Surgeon: Lavonia Drafts, MD;  Location: [facility];  Service: Gynecology;  Laterality: Bilateral;  . [place], 2002  . DILATION AND CURETTAGE OF UTERUS N/A 03/31/2012   Procedure: DILATATION AND CURETTAGE;  Surgeon: Emily Filbert, MD;  Location: [facility];  Service: Gynecology;  Laterality: N/A;  . LAPAROSCOPIC LYSIS OF ADHESIONS N/A 03/31/2012   Procedure: LAPAROSCOPIC LYSIS OF ADHESIONS;  Surgeon: Emily Filbert, MD;  Location: [facility];  Service: Gynecology;  Laterality: N/A;  . LAPAROSCOPY N/A 03/31/2012   Procedure: LAPAROSCOPY OPERATIVE;  Surgeon: Emily Filbert, MD;  Location: [facility];  Service: Gynecology;  Laterality: N/A;  . NOVASURE ABLATION N/A 03/31/2012   Procedure: NOVASURE ABLATION;  Surgeon: Emily Filbert, MD;  Location: [facility];  Service: Gynecology;  Laterality: N/A;  . ROBOTIC ASSISTED TOTAL HYSTERECTOMY N/A 04/16/2013   Procedure: ROBOTIC ASSISTED TOTAL HYSTERECTOMY;  Surgeon: Lavonia Drafts, MD;  Location: [facility];  Service: Gynecology;  Laterality: N/A;  . TUBAL LIGATION  2002  . WISDOM TOOTH EXTRACTION       OB History  Gravida  3   Para  2   Term  2   Preterm      AB  1   Living  2     SAB  1   TAB      Ectopic      Multiple      Live Births              Family History  Problem Relation Age of Onset  . Hypertension Mother   . Seizures Father   . Fibroids Sister     Social History   Tobacco Use  . Smoking status: Former Smoker    Packs/day: 0.00    Types: Cigarettes    Quit date: 01/21/2011     Years since quitting: 8.4  . Smokeless tobacco: Never Used  Vaping Use  . Vaping Use: Never used  Substance Use Topics  . Alcohol use: Yes    Comment: social occasions  . Drug use: No    Home Medications Prior to Admission medications   Medication Sig Start Date End Date Taking? Authorizing Provider  acetaminophen (TYLENOL) 500 MG tablet Take 1,000 mg by mouth every 6 (six) hours as needed for moderate pain or headache.    [provider]  Acetaminophen-Caffeine 500-65 MG TABS Take 1 tablet by mouth every 6 (six) hours as needed. 07/02/19   Maudie Mercury, MD  azelastine (ASTELIN) 0.1 % nasal spray Place 1 spray into both nostrils 2 (two) times daily. Use in each nostril as directed Patient not taking: Reported on 03/05/2019 12/01/17   Shella Maxim, NP  benzonatate (TESSALON) 100 MG capsule Take 1 capsule (100 mg total) by mouth 3 (three) times daily as needed for cough. Patient not taking: Reported on 03/05/2019 11/11/18   Ward, Delice Bison, DO  cyclobenzaprine (FLEXERIL) 10 MG tablet Take 1 tablet (10 mg total) by mouth 2 (two) times daily as needed for muscle spasms. Patient not taking: Reported on 04/13/2019 03/12/19   Montine Circle, PA-C  DULoxetine (CYMBALTA) 20 MG capsule Take 1 capsule (20 mg total) by mouth daily. 03/15/19 11/10/19  Jean Rosenthal, MD  ferrous sulfate 325 (65 FE) MG tablet Take 1 tablet (325 mg total) by mouth every other day. 11/08/18 03/11/28  Neva Seat, MD  gabapentin (NEURONTIN) 300 MG capsule TAKE 1 CAPSULE(300 MG) BY MOUTH TWICE DAILY 05/23/19   Maudie Mercury, MD  Menthol-Methyl Salicylate (SALONPAS PAIN RELIEF PATCH) [place] 1 patch onto the skin daily as needed (pain).    [provider]  methocarbamol (ROBAXIN) 500 MG tablet Take 1 tablet (500 mg total) by mouth every 6 (six) hours as needed for muscle spasms. 04/27/19   McKenzie, Lennie Muckle, PA-C    Allergies    Bee venom, Bactrim [sulfamethoxazole-trimethoprim], and  Codeine  Review of Systems   Review of Systems  Constitutional: Negative for chills and fever.  HENT: Negative for congestion, ear pain, facial swelling, hearing loss, sore throat and tinnitus.   Eyes: Negative for visual disturbance.  Respiratory: Negative for cough and shortness of breath.   Cardiovascular: Negative for chest pain.  Gastrointestinal: Positive for nausea. Negative for abdominal pain, diarrhea and vomiting.  Genitourinary: Negative for dysuria, enuresis and flank pain.  Musculoskeletal: Negative for back pain and joint swelling.  Skin: Negative for rash.  Neurological: Positive for headaches. Negative for dizziness.  Hematological: Does not bruise/bleed easily.    Physical Exam Updated Vital Signs BP 133/88   Pulse 66   Temp 98.6 F (  37 C)   Resp 17   Ht 5\' 5"  (1.651 m)   Wt 109.8 kg   LMP 03/31/2013 (Exact Date)   SpO2 100%   BMI 40.27 kg/m   Physical Exam Vitals and nursing note reviewed.  Constitutional:      General: She is not in acute distress.    Appearance: Normal appearance. She is not ill-appearing or diaphoretic.  HENT:     Head: Normocephalic and atraumatic.     Nose: No congestion or rhinorrhea.     Mouth/Throat:     Mouth: Mucous membranes are moist.     Pharynx: Oropharynx is clear.  Eyes:     General: No visual field deficit or scleral icterus.       Right eye: No discharge.        Left eye: No discharge.     Extraocular Movements: Extraocular movements intact.     Conjunctiva/sclera: Conjunctivae normal.     Pupils: Pupils are equal, round, and reactive to light.  Cardiovascular:     Rate and Rhythm: Normal rate and regular rhythm.     Pulses: Normal pulses.     Heart sounds: No murmur heard.  No friction rub. No gallop.   Pulmonary:     Effort: Pulmonary effort is normal. No respiratory distress.     Breath sounds: No wheezing, rhonchi or rales.  Abdominal:     General: There is no distension.     Palpations: Abdomen is  soft.     Tenderness: There is no abdominal tenderness. There is no guarding.  Musculoskeletal:        General: No swelling or tenderness.     Cervical back: Normal range of motion and neck supple. No rigidity or tenderness.  Skin:    General: Skin is warm and dry.     Capillary Refill: Capillary refill takes less than 2 seconds.     Findings: No rash.  Neurological:     General: No focal deficit present.     Mental Status: She is alert and oriented to person, place, and time.     GCS: GCS eye subscore is 4. GCS verbal subscore is 5. GCS motor subscore is 6.     Cranial Nerves: Cranial nerves are intact. No cranial nerve deficit or facial asymmetry.     Sensory: Sensation is intact. No sensory deficit.     Motor: Motor function is intact. No weakness or pronator drift.     Coordination: Coordination is intact. Romberg sign negative. Finger-Nose-Finger Test and Heel to [facility] Test normal.  Psychiatric:        Mood and Affect: Mood normal.     ED Results / Procedures / Treatments   Labs (all labs ordered are listed, but only abnormal results are displayed) Labs Reviewed  CBC - Abnormal; Notable for the following components:      Result Value   Hemoglobin 11.9 (*)    HCT 34.4 (*)    All other components within normal limits  URINALYSIS, ROUTINE W REFLEX MICROSCOPIC - Abnormal; Notable for the following components:   Hgb urine dipstick MODERATE (*)    Bacteria, UA RARE (*)    All other components within normal limits  CBG MONITORING, ED - Abnormal; Notable for the following components:   Glucose-Capillary 64 (*)    All other components within normal limits  BASIC METABOLIC PANEL  I-STAT BETA HCG BLOOD, ED ([facility], [facility], [facility] ONLY)    EKG None EKG Interpretation  Date/Time:07/08/2019 17:23:17  Ventricular Rate: 74BPM   PR Interval: 16ms   QRS Duration:70ms   QT Interval: 371ms   QTC Calculation:439   R Axis: 22 51 43    Text Interpretation: Normal sinus arrhythmia     Radiology CT Head Wo Contrast  Result Date: 07/08/2019 CLINICAL DATA:  Headache x2 days. EXAM: CT HEAD WITHOUT CONTRAST TECHNIQUE: Contiguous axial images were obtained from the base of the skull through the vertex without intravenous contrast. COMPARISON:  None. FINDINGS: Brain: No evidence of acute infarction, hemorrhage, hydrocephalus, extra-axial collection or mass lesion/mass effect. Vascular: No hyperdense vessel or unexpected calcification. Skull: Normal. Negative for fracture or focal lesion. Sinuses/Orbits: No acute finding. Other: None. IMPRESSION: No acute intracranial pathology. Electronically Signed   By: Virgina Norfolk M.D.   On: 07/08/2019 21:16    Procedures Procedures (including critical care time)  Medications Ordered in ED Medications  sodium chloride flush (NS) 0.9 % injection 3 mL (has no administration in time range)  ibuprofen (ADVIL) tablet 800 mg (800 mg Oral Refused 07/08/19 2245)  ondansetron (ZOFRAN-ODT) disintegrating tablet 4 mg (4 mg Oral Refused 07/08/19 2245)    ED Course  I have reviewed the triage vital signs and the nursing notes.  Pertinent labs & imaging results that were available during my care of the patient were reviewed by me and considered in my medical decision making (see chart for details).    MDM Rules/Calculators/A&P                          I have personally reviewed all imaging, labs and have interpreted them.  Due to patient's presentation I am concerned for brain mass versus intracranial bleed versus infection.  Unlikely patient's headache is result of infection as UA did not show nitrates, leukocytes, rare bacteria.  CBC did not show leukocytosis, patient was afebrile, nontachycardic, nontachypneic.  Patient had a head CT which did not show any acute abnormalities, neuro exam did not show any deficits making intracranial head bleed and brain mass unlikely.  Patient's CMP did not show any electrolyte abnormalities, did not show any  signs of elevated liver enzymes, no signs of AKI.  Patient was given pain medicine and Zofran which patient responded well to.  Patient appears to be resting comfortably in bed, showing no acute distress.  Vitals have remained stable, patient does not meet criteria to be admitted to the hospital.  Likely that patient suffered from a migraine and recommend that she continues to take over-the-counter pain medication.  Patient was given at home instructions as well as strict return precautions.  She is also instructed to follow-up with her primary care doctor for further evaluation.  Patient was discussed with attending who agrees with assessment and plan.  Patient was explained results and plan, patient stated that she understood and agrees with said plan.  Final Clinical Impression(s) / ED Diagnoses Final diagnoses:  Acute nonintractable headache, unspecified headache type    Rx / DC Orders ED Discharge Orders    None       Aron Baba 07/08/19 2255    Valarie Merino, MD 07/12/19 1119    Marcello Fennel, PA-C 07/26/19 1023    Valarie Merino, MD 07/30/19 [phone]

## 2019-07-08 NOTE — ED Triage Notes (Signed)
Pt reports headache x 2 days. Pt states she started feeling weakness and numbness sin the right leg and right arm and on and off "racing heart" this morning. Pt denies chest pain, diarrhea, abdominal pain.

## 2019-07-08 NOTE — ED Triage Notes (Addendum)
Pt presents w/headache x2 days, pt also reports feeling nauseous, having a "racing heart" weakness and Right leg and arm starting today. Pt reports spinal surgery 2 months ago with similar symptoms. Pt seen at [facility] today and sent here for further evaluation no neuro deficits. Pt seen at PCP for the same Monday

## 2019-07-10 DIAGNOSIS — Z23 Encounter for immunization: Secondary | ICD-10-CM | POA: Diagnosis not present

## 2019-07-16 DIAGNOSIS — Z981 Arthrodesis status: Secondary | ICD-10-CM | POA: Diagnosis not present

## 2019-07-16 DIAGNOSIS — M542 Cervicalgia: Secondary | ICD-10-CM | POA: Diagnosis not present

## 2019-07-16 DIAGNOSIS — Z9889 Other specified postprocedural states: Secondary | ICD-10-CM | POA: Diagnosis not present

## 2019-07-16 DIAGNOSIS — M4802 Spinal stenosis, cervical region: Secondary | ICD-10-CM | POA: Diagnosis not present

## 2019-08-14 ENCOUNTER — Other Ambulatory Visit: Payer: Self-pay

## 2019-08-14 ENCOUNTER — Ambulatory Visit: Payer: Medicaid Other | Attending: Orthopedic Surgery

## 2019-08-14 DIAGNOSIS — R293 Abnormal posture: Secondary | ICD-10-CM | POA: Insufficient documentation

## 2019-08-14 DIAGNOSIS — M6281 Muscle weakness (generalized): Secondary | ICD-10-CM | POA: Insufficient documentation

## 2019-08-14 DIAGNOSIS — M4322 Fusion of spine, cervical region: Secondary | ICD-10-CM

## 2019-08-14 NOTE — Therapy (Signed)
[place], [place], [address] Phone: [phone]   Fax:  [phone]  Physical Therapy Evaluation  Patient Details  Name: [name] MRN: [id number] Date of Birth: 1970/11/11 Referring Provider (PT): Phylliss Bob, MD   Encounter Date: 08/14/2019   PT End of Session - 08/14/19 1150    Visit Number 1    Number of Visits 4    Date for PT Re-Evaluation 09/11/19    Authorization Type Medicaid    PT Start Time 1050    PT Stop Time 1135    PT Time Calculation (min) 45 min    Activity Tolerance Patient tolerated treatment well    Behavior During Therapy [facility] for tasks assessed/performed           Past Medical History:  Diagnosis Date  . Anemia   . Fibroids, submucosal   . History of blood transfusion 02/2011   [facility] - ? 2-3 units transfused    Past Surgical History:  Procedure Laterality Date  . ANTERIOR CERVICAL DECOMPRESSION/DISCECTOMY FUSION 4 LEVELS N/A 04/26/2019   Procedure: ANTERIOR CERVICAL DECOMPRESSION FUSION CERVICAL 4-5, CERVICAL 5-6, CERVICAL 6-7 WITH INSTRUMENTATION AND ALLOGRAFT;  Surgeon: Phylliss Bob, MD;  Location: [place];  Service: Orthopedics;  Laterality: N/A;  . APPENDECTOMY    . BILATERAL SALPINGECTOMY Bilateral 04/16/2013   Procedure: BILATERAL SALPINGECTOMY;  Surgeon: Lavonia Drafts, MD;  Location: [facility] [facility];  Service: Gynecology;  Laterality: Bilateral;  . [place], 2002  . DILATION AND CURETTAGE OF UTERUS N/A 03/31/2012   Procedure: DILATATION AND CURETTAGE;  Surgeon: Emily Filbert, MD;  Location: [place] [facility];  Service: Gynecology;  Laterality: N/A;  . LAPAROSCOPIC LYSIS OF ADHESIONS N/A 03/31/2012   Procedure: LAPAROSCOPIC LYSIS OF ADHESIONS;  Surgeon: Emily Filbert, MD;  Location: [facility];  Service: Gynecology;  Laterality: N/A;  . LAPAROSCOPY N/A 03/31/2012   Procedure: LAPAROSCOPY OPERATIVE;  Surgeon: Emily Filbert, MD;  Location: [facility];  Service: Gynecology;   Laterality: N/A;  . NOVASURE ABLATION N/A 03/31/2012   Procedure: NOVASURE ABLATION;  Surgeon: Emily Filbert, MD;  Location: [facility];  Service: Gynecology;  Laterality: N/A;  . ROBOTIC ASSISTED TOTAL HYSTERECTOMY N/A 04/16/2013   Procedure: ROBOTIC ASSISTED TOTAL HYSTERECTOMY;  Surgeon: Lavonia Drafts, MD;  Location: [facility];  Service: Gynecology;  Laterality: N/A;  . TUBAL LIGATION  2002  . WISDOM TOOTH EXTRACTION      There were no vitals filed for this visit.    Subjective Assessment - 08/14/19 1052    Subjective Pt reports end of October/1st of November last year, she started having pain in her left shoulder and neck with some headaches. Since ACDF C4-7 on 04/26/19, pt has worse headaches at least 1x/week. 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 She is still doing heat and ice. She wears some kind of "brace" since 1st of July for 4 hours a day which is supposed to "accelerate healing". She as told she would be healed completely 6 mo-1 year out.    Pertinent History Hysterectomy, 2 cesareans, appendectomy    Limitations Other (comment);Sitting;Lifting   driving   Patient Stated Goals be able to take control of the pain and continue to heal to 100%    Currently in Pain? Yes    Pain Score 3    at worst: no longer 10/10 like before   Pain  Location Neck    Pain Orientation Left    Pain Descriptors / Indicators Aching;Tightness    Pain Type Surgical pain;Chronic pain    Pain Radiating Towards L medial shoulder blade    Aggravating Factors  Driving. sleeping, looking down, not lifting more than 10#    Pain Relieving Factors Pillow behind neck, hot shower, PRN walking, tension relief pill with caffeine, daughter massaging L shoulder    Effect of Pain on Daily Activities Work              [facility] PT Assessment - 08/14/19 0001      Assessment   Medical Diagnosis ACDF C4-7    Referring Provider (PT) Phylliss Bob, MD    Onset Date/Surgical Date 04/26/19    Hand Dominance Right    Next MD Visit August 2021    Prior Therapy Yes, for back       Balance Screen   Has the patient fallen in the past 6 months No    Has the patient had a decrease in activity level because of a fear of falling?  Yes    Is the patient reluctant to leave their home because of a fear of falling?  No      Prior Function   Level of Independence Independent    Vocation Other (comment)    Vocation Requirements Stylist, part time at [employer]   Posture/Postural Control Postural limitations    Postural Limitations Rounded Shoulders;Forward head;Increased thoracic kyphosis;Posterior pelvic tilt    Posture Comments able to correct sitting posture to neutral pelvic tilt, slight forward shoulders, mild forward head       ROM / Strength   AROM / PROM / Strength AROM;Strength      AROM   AROM Assessment Site Cervical    Cervical Flexion 40    Cervical Extension 40    Cervical - Right Side Bend 25   pulling from L   Cervical - Left Side Bend 30    Cervical - Right Rotation 60   pulling from L    Cervical - Left Rotation 62      Strength   Strength Assessment Site Shoulder    Right/Left Shoulder Right;Left    Right Shoulder Flexion 4-/5    Right Shoulder ABduction 4-/5    Right Shoulder Internal Rotation 4/5    Right Shoulder External Rotation 4/5    Left Shoulder Flexion 4-/5   discomfort, less with upright posture   Left Shoulder Extension 4-/5    Left Shoulder Internal Rotation 4/5    Left Shoulder External Rotation 3+/5      Palpation   Spinal mobility hypomobility mid-upper thoracic spine    Palpation comment Soft tissue restrictions to B UT L>R, SO                      Objective measurements completed on examination: See above findings.               PT Education - 08/14/19 1146    Education Details Diagnosis, prognosis, posture, POC, HEP     Person(s) Educated Patient    Methods Explanation;Demonstration;Tactile cues;Verbal cues;Handout    Comprehension Verbal cues required;Returned demonstration;Verbalized understanding;Tactile cues required;Need further instruction            PT Short Term Goals - 08/14/19 1206      PT SHORT TERM GOAL #1   Title Pt will be independent and  compliant with initial HEP.    Time 1    Period Weeks    Status New    Target Date 08/21/19      PT SHORT TERM GOAL #2   Title Pt will demonstrates pain free B cervical rotation to >/= 65 degrees.    Time 2    Period Weeks    Status New    Target Date 08/28/19      PT SHORT TERM GOAL #3   Title Pt will report <5/10 pain at worst with work activities.    Time 3    Period Weeks    Status New    Target Date 09/04/19             PT Long Term Goals - 08/14/19 1210      PT LONG TERM GOAL #1   Title Pt will increase B shoulder MMT to 4+/5 without pain or compensation, as needed for job duties.    Time 6    Period Weeks    Status New    Target Date 09/25/19      PT LONG TERM GOAL #2   Title Pt will demonstrate pain free cervical AROM WFL and with good postural alignment.    Time 6    Period Weeks    Status New    Target Date 09/25/19      PT LONG TERM GOAL #3   Title Pt will have no occurrence of headaches/ability to prevent onset of HA with HEP performance.    Baseline at least 1x/week    Time 8    Period Weeks    Status New    Target Date 10/09/19      PT LONG TERM GOAL #4   Title Pt will return to work full time with no c/o limitation from pain.    Time 8    Period Weeks    Status New    Target Date 10/09/19                  Plan - 08/14/19 1151    Clinical Impression Statement Pt is a 49 yo female who presents to outpatient PT s/p ACDF C4-7 on 04/26/19. Pt continues to have headaches at least 1x/week, most likely due to muscle imbalances and postural dysfunction. Pt demonstrates weakness in L>R shoulder and deep  neck flexors, hypomobility of thoracic spine, soft tissue restrictions to B UT/SO/pecs, and poor posture. Pt was educated on diagnosis, prognosis, HEP, and POC with pt verbalizing understanding and consent. Pt responed well to initial postural correction and was provided with HEP to continue awareness and alignment. Pt will benefit from skilled physical therapy 2x/week for 6-8 weeks (1x/week 1st 3 weeks per Medicaid authorization) to address impairments and restore PLOF for full participation in/return to work and leisure activities.    Personal Factors and Comorbidities Age;Time since onset of injury/illness/exacerbation;Past/Current Experience;Profession;Comorbidity 1    Comorbidities Anemia    Examination-Activity Limitations Sit;Sleep;Carry;Lift    Examination-Participation Restrictions Driving;Community Activity;Shop;Cleaning;Laundry    Stability/Clinical Decision Making Stable/Uncomplicated    Clinical Decision Making Low    Rehab Potential Good    PT Frequency 2x / week   1x/week 1st 3 weeks per Medicaid authorization   PT Duration 8 weeks    PT Treatment/Interventions Spinal Manipulations;Joint Manipulations;Taping;Dry needling;Passive range of motion;Manual techniques;Patient/family education;Therapeutic exercise;Neuromuscular re-education;Therapeutic activities;Functional mobility training;Traction;Moist Heat;Iontophoresis 4mg /ml Dexamethasone;ADLs/Self Care Home Management;Cryotherapy;Electrical Stimulation    PT Next Visit Plan Assess response to HEP and posture, continue progress  postural alignment, DNF endurance/strength, pec flexibility, initiate periscapular strengthening; Manual to address soft tissue and joint restricitons    PT Home Exercise Plan [id number]: Supine pec S with vertical towel roll, standing anatomical position at doorway corner with scap retract/depress, chin tucks (supine, seated in car, standing at wall)    Consulted and Agree with Plan of Care Patient            Patient will benefit from skilled therapeutic intervention in order to improve the following deficits and impairments:  Pain, Postural dysfunction, Impaired flexibility, Increased fascial restricitons, Decreased strength, Decreased activity tolerance, Improper body mechanics, Impaired perceived functional ability, Hypomobility  Visit Diagnosis: Fusion of spine, cervical region - Plan: PT plan of care cert/re-cert  Abnormal posture - Plan: PT plan of care cert/re-cert  Muscle weakness (generalized) - Plan: PT plan of care cert/re-cert     Problem List Patient Active Problem List   Diagnosis Date Noted  . Radiculopathy 04/26/2019  . Cervical myelopathy ([place]) 03/07/2019  . Weakness 03/01/2019  . Headache 02/19/2019  . Shoulder pain, left 12/07/2018  . Iron deficiency anemia 11/07/2018  . Elevated blood pressure reading 11/07/2018  . Preventative health care 11/07/2018  . Postoperative state 04/16/2013  . Hematometra 01/09/2013  . Menorrhagia 01/09/2013  . Chronic pelvic pain in female 01/09/2013    Check all possible CPT codes:      [x]  97110 (Therapeutic Exercise)  []  92507 (SLP Treatment)  [x]  97112 (Neuro Re-ed)   []  92526 (Swallowing Treatment)   []  97116 (Gait Training)   []  [id number] (Cognitive Training, 1st 15 minutes) [x]  97140 (Manual Therapy)   []  97130 (Cognitive Training, each add'l 15 minutes)  [x]  97530 (Therapeutic Activities)  []  Other, List CPT Code ____________    [x]  67591 (Self Care)       []  All codes above (97110 - 97535)  [x]  97012 (Mechanical Traction)  [x]  97014 (E-stim Unattended)  []  97032 (E-stim manual)  [x]  97033 (Ionto)  []  97035 (Ultrasound)  []  97016 (Vaso)  []  97760 (Orthotic Fit) []  [id number] (Prosthetic Training) []  [id number] (Physical Performance Training) []  [id number] (Aquatic Therapy) []  [id number] (Canalith Repositioning) []  [id number] (Contrast Bath) []  [id number] (Paraffin) []  97597 (Wound Care 1st 20 sq cm) []  97598 (Wound Care each add'l 20 sq  cm)      Izell Leighton, PT, DPT 08/14/2019, 12:16 PM  [facility] [address] [place], [place], [address] Phone: [phone]   Fax:  [phone]  Name: [name] MRN: [id number] Date of Birth: Aug 18, 1970

## 2019-08-28 ENCOUNTER — Other Ambulatory Visit: Payer: Self-pay

## 2019-08-28 ENCOUNTER — Ambulatory Visit: Payer: Medicaid Other | Attending: Orthopedic Surgery | Admitting: Physical Therapy

## 2019-08-28 ENCOUNTER — Encounter: Payer: Self-pay | Admitting: Physical Therapy

## 2019-08-28 DIAGNOSIS — M4322 Fusion of spine, cervical region: Secondary | ICD-10-CM | POA: Insufficient documentation

## 2019-08-28 DIAGNOSIS — R293 Abnormal posture: Secondary | ICD-10-CM | POA: Insufficient documentation

## 2019-08-28 DIAGNOSIS — M6281 Muscle weakness (generalized): Secondary | ICD-10-CM | POA: Diagnosis present

## 2019-08-28 NOTE — Therapy (Signed)
[place], [place], [address] Phone: [phone]   Fax:  [phone]  Physical Therapy Treatment  Patient Details  Name: [name] MRN: [id number] Date of Birth: 02-25-1970 Referring Provider (PT): Phylliss Bob, MD   Encounter Date: 08/28/2019   PT End of Session - 08/28/19 1147    Visit Number 2    Number of Visits 4    Date for PT Re-Evaluation 09/11/19    Authorization Type Medicaid [employer], OON    PT Start Time 1147    PT Stop Time 1228    PT Time Calculation (min) 41 min    Activity Tolerance Patient tolerated treatment well    Behavior During Therapy [facility] for tasks assessed/performed           Past Medical History:  Diagnosis Date   Anemia    Fibroids, submucosal    History of blood transfusion 02/2011   [facility] - ? 2-3 units transfused    Past Surgical History:  Procedure Laterality Date   ANTERIOR CERVICAL DECOMPRESSION/DISCECTOMY FUSION 4 LEVELS N/A 04/26/2019   Procedure: ANTERIOR CERVICAL DECOMPRESSION FUSION CERVICAL 4-5, CERVICAL 5-6, CERVICAL 6-7 WITH INSTRUMENTATION AND ALLOGRAFT;  Surgeon: Phylliss Bob, MD;  Location: [place];  Service: Orthopedics;  Laterality: N/A;   APPENDECTOMY     BILATERAL SALPINGECTOMY Bilateral 04/16/2013   Procedure: BILATERAL SALPINGECTOMY;  Surgeon: Lavonia Drafts, MD;  Location: [facility];  Service: Gynecology;  Laterality: Bilateral;   CESAREAN SECTION  1994, 2002   DILATION AND CURETTAGE OF UTERUS N/A 03/31/2012   Procedure: DILATATION AND CURETTAGE;  Surgeon: Emily Filbert, MD;  Location: [facility] [facility];  Service: Gynecology;  Laterality: N/A;   LAPAROSCOPIC LYSIS OF ADHESIONS N/A 03/31/2012   Procedure: LAPAROSCOPIC LYSIS OF ADHESIONS;  Surgeon: Emily Filbert, MD;  Location: [place] [facility] [facility];  Service: Gynecology;  Laterality: N/A;   LAPAROSCOPY N/A 03/31/2012   Procedure: LAPAROSCOPY OPERATIVE;  Surgeon: Emily Filbert, MD;  Location: [facility];  Service:  Gynecology;  Laterality: N/A;   NOVASURE ABLATION N/A 03/31/2012   Procedure: NOVASURE ABLATION;  Surgeon: Emily Filbert, MD;  Location: [facility];  Service: Gynecology;  Laterality: N/A;   ROBOTIC ASSISTED TOTAL HYSTERECTOMY N/A 04/16/2013   Procedure: ROBOTIC ASSISTED TOTAL HYSTERECTOMY;  Surgeon: Lavonia Drafts, MD;  Location: [facility];  Service: Gynecology;  Laterality: N/A;   TUBAL LIGATION  2002   WISDOM TOOTH EXTRACTION      There were no vitals filed for this visit.   Subjective Assessment - 08/28/19 1149    Subjective Biggest complaint is HA. Nausea with some numbness during HA. gets a bad one at least 1/week. triggers are unknown.    Patient Stated Goals be able to take control of the pain and continue to heal to 100%                             [facility] Adult PT Treatment/Exercise - 08/28/19 0001      Exercises   Exercises Neck      Neck Exercises: Seated   Neck Retraction Limitations chin tucks    Cervical Rotation Both;10 reps    Shoulder Flexion Both;15 reps    Shoulder Flexion Limitations yellow tband held taught    Other Seated Exercise scap retraction    Other Seated Exercise GHJ ER yellow tband      Manual Therapy   Manual Therapy Soft tissue mobilization;Joint mobilization;Taping  Manual therapy comments skilled palpation and monitoring during TPDN    Joint Mobilization bil first rib & gross rib mobs in prone    Soft tissue mobilization bil upper traps    McConnell bil upper trap activation      Neck Exercises: Stretches   Upper Trapezius Stretch Right;Left    Other Neck Stretches thoracic extension over chair            Trigger Point Dry Needling - 08/28/19 0001    Consent Given? Yes    Education Handout Provided --   verbal education   Muscles Treated Head and Neck Upper trapezius    Upper Trapezius Response Twitch reponse elicited;Palpable increased muscle length   bilateral               PT Education - 08/28/19 1220     Education Details TPDN and expected outcomes, driving posture    Person(s) Educated Patient    Methods Explanation;Handout    Comprehension Verbalized understanding;Need further instruction            PT Short Term Goals - 08/14/19 1206      PT SHORT TERM GOAL #1   Title Pt will be independent and compliant with initial HEP.    Time 1    Period Weeks    Status New    Target Date 08/21/19      PT SHORT TERM GOAL #2   Title Pt will demonstrates pain free B cervical rotation to >/= 65 degrees.    Time 2    Period Weeks    Status New    Target Date 08/28/19      PT SHORT TERM GOAL #3   Title Pt will report <5/10 pain at worst with work activities.    Time 3    Period Weeks    Status New    Target Date 09/04/19             PT Long Term Goals - 08/14/19 1210      PT LONG TERM GOAL #1   Title Pt will increase B shoulder MMT to 4+/5 without pain or compensation, as needed for job duties.    Time 6    Period Weeks    Status New    Target Date 09/25/19      PT LONG TERM GOAL #2   Title Pt will demonstrate pain free cervical AROM WFL and with good postural alignment.    Time 6    Period Weeks    Status New    Target Date 09/25/19      PT LONG TERM GOAL #3   Title Pt will have no occurrence of headaches/ability to prevent onset of HA with HEP performance.    Baseline at least 1x/week    Time 8    Period Weeks    Status New    Target Date 10/09/19      PT LONG TERM GOAL #4   Title Pt will return to work full time with no c/o limitation from pain.    Time 8    Period Weeks    Status New    Target Date 10/09/19                 Plan - 08/28/19 1411    Clinical Impression Statement Pt agreed to use of TPDN to reduce tension in bil upper traps. She reported soreness as expected. Discussed postural alignment and added some gentler periscap strengthening with good tolerance.  PT Treatment/Interventions Spinal Manipulations;Joint  Manipulations;Taping;Dry needling;Passive range of motion;Manual techniques;Patient/family education;Therapeutic exercise;Neuromuscular re-education;Therapeutic activities;Functional mobility training;Traction;Moist Heat;Iontophoresis 4mg /ml Dexamethasone;ADLs/Self Care Home Management;Cryotherapy;Electrical Stimulation    PT Next Visit Plan outcome of DN? continue PRN, cont postural/periscap strengthening, DNF strength    PT Home Exercise Plan [id number]    Consulted and Agree with Plan of Care Patient           Patient will benefit from skilled therapeutic intervention in order to improve the following deficits and impairments:  Pain, Postural dysfunction, Impaired flexibility, Increased fascial restricitons, Decreased strength, Decreased activity tolerance, Improper body mechanics, Impaired perceived functional ability, Hypomobility  Visit Diagnosis: Fusion of spine, cervical region  Abnormal posture  Muscle weakness (generalized)     Problem List Patient Active Problem List   Diagnosis Date Noted   Radiculopathy 04/26/2019   Cervical myelopathy ([place]) 03/07/2019   Weakness 03/01/2019   Headache 02/19/2019   Shoulder pain, left 12/07/2018   Iron deficiency anemia 11/07/2018   Elevated blood pressure reading 11/07/2018   Preventative health care 11/07/2018   Postoperative state 04/16/2013   Hematometra 01/09/2013   Menorrhagia 01/09/2013   Chronic pelvic pain in female 01/09/2013    Mychael Soots C. Brynnley Dayrit PT, DPT 08/28/19 2:16 PM   [facility] [address] [place], [place], [address] Phone: [phone]   Fax:  [phone]  Name: [name] MRN: [id number] Date of Birth: Apr 18, 1970

## 2019-09-03 ENCOUNTER — Ambulatory Visit: Payer: Medicaid Other

## 2019-09-03 ENCOUNTER — Other Ambulatory Visit: Payer: Self-pay

## 2019-09-03 DIAGNOSIS — M4322 Fusion of spine, cervical region: Secondary | ICD-10-CM | POA: Diagnosis not present

## 2019-09-03 DIAGNOSIS — R293 Abnormal posture: Secondary | ICD-10-CM

## 2019-09-03 DIAGNOSIS — M6281 Muscle weakness (generalized): Secondary | ICD-10-CM

## 2019-09-03 NOTE — Therapy (Signed)
[place], [place], [address] Phone: [phone]   Fax:  [phone]  Physical Therapy Treatment  Patient Details  Name: [name] MRN: [id number] Date of Birth: 1970/03/03 Referring Provider (PT): Phylliss Bob, MD   Encounter Date: 09/03/2019   PT End of Session - 09/03/19 1113    Visit Number 3    Number of Visits 4    Date for PT Re-Evaluation 09/11/19    Authorization Type Medicaid [employer], OON    PT Start Time 1104    PT Stop Time 1145    PT Time Calculation (min) 41 min    Activity Tolerance Patient tolerated treatment well    Behavior During Therapy [facility] for tasks assessed/performed           Past Medical History:  Diagnosis Date  . Anemia   . Fibroids, submucosal   . History of blood transfusion 02/2011   [facility] - ? 2-3 units transfused    Past Surgical History:  Procedure Laterality Date  . ANTERIOR CERVICAL DECOMPRESSION/DISCECTOMY FUSION 4 LEVELS N/A 04/26/2019   Procedure: ANTERIOR CERVICAL DECOMPRESSION FUSION CERVICAL 4-5, CERVICAL 5-6, CERVICAL 6-7 WITH INSTRUMENTATION AND ALLOGRAFT;  Surgeon: Phylliss Bob, MD;  Location: [facility];  Service: Orthopedics;  Laterality: N/A;  . APPENDECTOMY    . BILATERAL SALPINGECTOMY Bilateral 04/16/2013   Procedure: BILATERAL SALPINGECTOMY;  Surgeon: Lavonia Drafts, MD;  Location: [facility];  Service: Gynecology;  Laterality: Bilateral;  . [place], 2002  . DILATION AND CURETTAGE OF UTERUS N/A 03/31/2012   Procedure: DILATATION AND CURETTAGE;  Surgeon: Emily Filbert, MD;  Location: [facility];  Service: Gynecology;  Laterality: N/A;  . LAPAROSCOPIC LYSIS OF ADHESIONS N/A 03/31/2012   Procedure: LAPAROSCOPIC LYSIS OF ADHESIONS;  Surgeon: Emily Filbert, MD;  Location: [facility];  Service: Gynecology;  Laterality: N/A;  . LAPAROSCOPY N/A 03/31/2012   Procedure: LAPAROSCOPY OPERATIVE;  Surgeon: Emily Filbert, MD;  Location: [facility];  Service:  Gynecology;  Laterality: N/A;  . NOVASURE ABLATION N/A 03/31/2012   Procedure: NOVASURE ABLATION;  Surgeon: Emily Filbert, MD;  Location: [facility];  Service: Gynecology;  Laterality: N/A;  . ROBOTIC ASSISTED TOTAL HYSTERECTOMY N/A 04/16/2013   Procedure: ROBOTIC ASSISTED TOTAL HYSTERECTOMY;  Surgeon: Lavonia Drafts, MD;  Location: [facility];  Service: Gynecology;  Laterality: N/A;  . TUBAL LIGATION  2002  . WISDOM TOOTH EXTRACTION      There were no vitals filed for this visit.   Subjective Assessment - 09/03/19 1106    Subjective Pt reports she has not really had a headache. She began to have one on Saturday with the storms, but was able to get rid of it lying down in a dark room. She thinks the needling did actually help.    Patient Stated Goals be able to take control of the pain and continue to heal to 100%    Currently in Pain? Yes    Pain Score 4     Pain Location Neck    Pain Orientation Right;Left   upper traps   Pain Descriptors / Indicators Other (Comment)   stiffnes                            [facility] Adult PT Treatment/Exercise - 09/03/19 0001      Neck Exercises: Machines for Strengthening   UBE (Upper Arm Bike) Lvl 2.5 3 min fwd/3 min bkwd  Neck Exercises: Seated   Neck Retraction Limitations chin tucks    Cervical Rotation Both;10 reps    Shoulder Flexion Both;15 reps    Shoulder Flexion Limitations yellow tband held taught    Other Seated Exercise scap retraction    Other Seated Exercise GHJ ER yellow tband      Neck Exercises: Sidelying   Other Sidelying Exercise open books B HBH (stabilizing top leg      Neck Exercises: Prone   Shoulder Extension 10 reps   5 seconds   Shoulder Extension Weights (lbs) palms down to table, reaching with fingertips    Shoulder Extension Limitations Prone I's with head down      Manual Therapy   Manual Therapy Joint mobilization;Soft tissue mobilization;Passive ROM    Manual therapy comments prone    Joint  Mobilization Cervical side glides, Thoracic PAs, R ribcage/scap mobs    Soft tissue mobilization STM/DTM B UT, LS, C/S PS, SO    Passive ROM B lateral flexion, extension, rotation                  PT Education - 09/03/19 1145    Education Details Getting two tennis balls to tape together like a peanut to place b/t shoulder blades or on occiput for soft tissue tension release    Person(s) Educated Patient    Methods Explanation;Demonstration    Comprehension Verbalized understanding            PT Short Term Goals - 08/14/19 1206      PT SHORT TERM GOAL #1   Title Pt will be independent and compliant with initial HEP.    Time 1    Period Weeks    Status New    Target Date 08/21/19      PT SHORT TERM GOAL #2   Title Pt will demonstrates pain free B cervical rotation to >/= 65 degrees.    Time 2    Period Weeks    Status New    Target Date 08/28/19      PT SHORT TERM GOAL #3   Title Pt will report <5/10 pain at worst with work activities.    Time 3    Period Weeks    Status New    Target Date 09/04/19             PT Long Term Goals - 08/14/19 1210      PT LONG TERM GOAL #1   Title Pt will increase B shoulder MMT to 4+/5 without pain or compensation, as needed for job duties.    Time 6    Period Weeks    Status New    Target Date 09/25/19      PT LONG TERM GOAL #2   Title Pt will demonstrate pain free cervical AROM WFL and with good postural alignment.    Time 6    Period Weeks    Status New    Target Date 09/25/19      PT LONG TERM GOAL #3   Title Pt will have no occurrence of headaches/ability to prevent onset of HA with HEP performance.    Baseline at least 1x/week    Time 8    Period Weeks    Status New    Target Date 10/09/19      PT LONG TERM GOAL #4   Title Pt will return to work full time with no c/o limitation from pain.    Time 8    Period  Weeks    Status New    Target Date 10/09/19                 Plan - 09/03/19 1114      PT Treatment/Interventions Spinal Manipulations;Joint Manipulations;Taping;Dry needling;Passive range of motion;Manual techniques;Patient/family education;Therapeutic exercise;Neuromuscular re-education;Therapeutic activities;Functional mobility training;Traction;Moist Heat;Iontophoresis 4mg /ml Dexamethasone;ADLs/Self Care Home Management;Cryotherapy;Electrical Stimulation    PT Next Visit Plan DN continue PRN, cont postural/periscap strengthening, DNF strength    PT Home Exercise Plan [id number]    Consulted and Agree with Plan of Care Patient           Patient will benefit from skilled therapeutic intervention in order to improve the following deficits and impairments:  Pain, Postural dysfunction, Impaired flexibility, Increased fascial restricitons, Decreased strength, Decreased activity tolerance, Improper body mechanics, Impaired perceived functional ability, Hypomobility  Visit Diagnosis: Fusion of spine, cervical region  Abnormal posture  Muscle weakness (generalized)     Problem List Patient Active Problem List   Diagnosis Date Noted  . Radiculopathy 04/26/2019  . Cervical myelopathy ([place]) 03/07/2019  . Weakness 03/01/2019  . Headache 02/19/2019  . Shoulder pain, left 12/07/2018  . Iron deficiency anemia 11/07/2018  . Elevated blood pressure reading 11/07/2018  . Preventative health care 11/07/2018  . Postoperative state 04/16/2013  . Hematometra 01/09/2013  . Menorrhagia 01/09/2013  . Chronic pelvic pain in female 01/09/2013    Izell Congress, PT, DPT 09/03/2019, 11:48 AM  [facility] [address] [place], [place], [address] Phone: [phone]   Fax:  [phone]  Name: ANTONISHA WASKEY MRN: [id number] Date of Birth: 1970-05-01

## 2019-09-04 ENCOUNTER — Telehealth: Payer: Self-pay

## 2019-09-04 ENCOUNTER — Ambulatory Visit: Payer: Medicaid Other

## 2019-09-04 NOTE — Telephone Encounter (Signed)
Called pt and LM VM that appt was at 11:00 AM. Let her know I hope she is ok, and she has no further appointments scheduled. Should she want to schedule, to call front desk at [place] [facility]. Yvette Rack, PT, DPT

## 2019-09-17 ENCOUNTER — Encounter: Payer: Self-pay | Admitting: Physical Therapy

## 2019-09-17 ENCOUNTER — Other Ambulatory Visit: Payer: Self-pay

## 2019-09-17 ENCOUNTER — Ambulatory Visit: Payer: Medicaid Other | Admitting: Physical Therapy

## 2019-09-17 DIAGNOSIS — R293 Abnormal posture: Secondary | ICD-10-CM

## 2019-09-17 DIAGNOSIS — M4322 Fusion of spine, cervical region: Secondary | ICD-10-CM

## 2019-09-17 DIAGNOSIS — M6281 Muscle weakness (generalized): Secondary | ICD-10-CM

## 2019-09-17 NOTE — Patient Instructions (Signed)
Access Code: [email or web address] URL: [email or web address] Date: 09/17/2019 Prepared by: Hilda Blades  Exercises Supine Thoracic Mobilization Towel Roll Vertical with Arm Stretch - 2 x daily - 7 x weekly - 3 sets - 30-60 seconds hold Supine Cervical Retraction with Towel - 2 x daily - 7 x weekly - 2 sets - 10 reps - 3-5 seconds hold Supine Suboccipital Release with Tennis Balls - 1 x daily - 7 x weekly - 3 sets - 10 reps Supine Shoulder Horizontal Abduction with Resistance - 1 x daily - 7 x weekly - 3 sets - 10 reps Standing Shoulder External Rotation with Resistance - 1 x daily - 7 x weekly - 3 sets - 10 reps Seated Thoracic Lumbar Extension with Pectoralis Stretch - 2 x daily - 7 x weekly - 5 reps

## 2019-09-17 NOTE — Therapy (Addendum)
[place], [place], [address] Phone: [phone]   Fax:  [phone]  Physical Therapy Treatment / ERO   Progress Note Reporting Period 08/14/2019 to 09/17/2019  See note below for Objective Data and Assessment of Progress/Goals.    Patient Details  Name: [name] MRN: [id number] Date of Birth: 11/16/1970 Referring Provider (PT): Phylliss Bob, MD   Encounter Date: 09/17/2019   PT End of Session - 09/17/19 1141    Visit Number 4    Number of Visits 12    Date for PT Re-Evaluation 11/12/19    Authorization Type [employer]    Authorization - Visit Number 3    Authorization - Number of Visits 27    PT Start Time 1130    PT Stop Time 1215    PT Time Calculation (min) 45 min    Activity Tolerance Patient tolerated treatment well    Behavior During Therapy [facility] for tasks assessed/performed           Past Medical History:  Diagnosis Date  . Anemia   . Fibroids, submucosal   . History of blood transfusion 02/2011   [facility] - ? 2-3 units transfused    Past Surgical History:  Procedure Laterality Date  . ANTERIOR CERVICAL DECOMPRESSION/DISCECTOMY FUSION 4 LEVELS N/A 04/26/2019   Procedure: ANTERIOR CERVICAL DECOMPRESSION FUSION CERVICAL 4-5, CERVICAL 5-6, CERVICAL 6-7 WITH INSTRUMENTATION AND ALLOGRAFT;  Surgeon: Phylliss Bob, MD;  Location: [facility];  Service: Orthopedics;  Laterality: N/A;  . APPENDECTOMY    . BILATERAL SALPINGECTOMY Bilateral 04/16/2013   Procedure: BILATERAL SALPINGECTOMY;  Surgeon: Lavonia Drafts, MD;  Location: [facility];  Service: Gynecology;  Laterality: Bilateral;  . [place], 2002  . DILATION AND CURETTAGE OF UTERUS N/A 03/31/2012   Procedure: DILATATION AND CURETTAGE;  Surgeon: Emily Filbert, MD;  Location: [facility];  Service: Gynecology;  Laterality: N/A;  . LAPAROSCOPIC LYSIS OF ADHESIONS N/A 03/31/2012   Procedure: LAPAROSCOPIC LYSIS OF ADHESIONS;  Surgeon:  Emily Filbert, MD;  Location: [facility];  Service: Gynecology;  Laterality: N/A;  . LAPAROSCOPY N/A 03/31/2012   Procedure: LAPAROSCOPY OPERATIVE;  Surgeon: Emily Filbert, MD;  Location: [facility];  Service: Gynecology;  Laterality: N/A;  . NOVASURE ABLATION N/A 03/31/2012   Procedure: NOVASURE ABLATION;  Surgeon: Emily Filbert, MD;  Location: [facility];  Service: Gynecology;  Laterality: N/A;  . ROBOTIC ASSISTED TOTAL HYSTERECTOMY N/A 04/16/2013   Procedure: ROBOTIC ASSISTED TOTAL HYSTERECTOMY;  Surgeon: Lavonia Drafts, MD;  Location: [facility];  Service: Gynecology;  Laterality: N/A;  . TUBAL LIGATION  2002  . WISDOM TOOTH EXTRACTION      There were no vitals filed for this visit.   Subjective Assessment - 09/17/19 1129    Subjective Patient reports things have been going ok. States her neck is not feeling so good, states left shoulder is hurting more today and she has a little headache. She doesn't know if it is how she slept but the increased discomfort started yesterday.    Limitations Sitting;Lifting;House hold activities   driving   Patient Stated Goals Be able to take control of the pain and continue to heal to 100%    Currently in Pain? Yes    Pain Score 6     Pain Location Neck    Pain Orientation Left    Pain Descriptors / Indicators Aching;Tightness;Sore    Pain Type Chronic pain    Pain Radiating Towards Left  shoulder, upper trap and scapular region    Pain Onset More than a month ago    Pain Frequency Constant    Aggravating Factors  Driving, sleeping, looking down, not lifting more than 10#    Pain Relieving Factors Heat, massage    Effect of Pain on Daily Activities Patient limited with sitting and work              [facility] PT Assessment - 09/17/19 0001      Assessment   Medical Diagnosis ACDF C4-7    Referring Provider (PT) Phylliss Bob, MD    Onset Date/Surgical Date 04/26/19      Precautions   Precautions None      Restrictions   Weight Bearing Restrictions No        Balance Screen   Has the patient fallen in the past 6 months No    Has the patient had a decrease in activity level because of a fear of falling?  No    Is the patient reluctant to leave their home because of a fear of falling?  No      Prior Function   Level of Independence Independent    Banker, part time at [employer]      Observation/Other Assessments   Focus on Therapeutic Outcomes (FOTO)  NA - MCD      Posture/Postural Control   Postural Limitations Rounded Shoulders;Forward head;Increased thoracic kyphosis      AROM   Cervical Flexion 40   increased anterior neck discomfort   Cervical Extension 50    Cervical - Right Side Bend 25   pulling left upper trap   Cervical - Left Side Bend 40    Cervical - Right Rotation 60    Cervical - Left Rotation 55   pulling left upper trap     Strength   Overall Strength Comments Periscapular strength grossly 4-/5 MMT                         [facility] Adult PT Treatment/Exercise - 09/17/19 0001      Self-Care   Self-Care Other Self-Care Comments    Other Self-Care Comments  POC update      Exercises   Exercises Neck      Neck Exercises: Machines for Strengthening   UBE (Upper Arm Bike) L1 x 6 min (3 fwd/bwd)      Neck Exercises: Theraband   Shoulder External Rotation 10 reps   2 sets   Shoulder External Rotation Limitations yellow    Horizontal ABduction 10 reps   2 sets   Horizontal ABduction Limitations supine with yellow      Neck Exercises: Seated   Neck Retraction 10 reps;3 secs      Neck Exercises: Supine   Neck Retraction 10 reps;3 secs    Neck Retraction Limitations with towel behind neck, and use of peanut tennis balls      Manual Therapy   Manual Therapy Passive ROM;Manual Traction;Myofascial release;Soft tissue mobilization    Soft tissue mobilization Left upper trap    Myofascial Release Suboccipital release    Passive ROM Upper trap stretching    Manual Traction With  suboccipital release                  PT Education - 09/17/19 1211    Education Details POC and HEP update    Person(s) Educated Patient    Methods Explanation;Demonstration;Tactile cues;Verbal cues;Handout  Comprehension Verbalized understanding;Returned demonstration;Verbal cues required;Tactile cues required;Need further instruction            PT Short Term Goals - 09/17/19 1357      PT SHORT TERM GOAL #1   Title Pt will be independent and compliant with initial HEP.    Time 1    Period Weeks    Status Achieved    Target Date 08/21/19      PT SHORT TERM GOAL #2   Title Pt will demonstrates pain free B cervical rotation to >/= 65 degrees.    Baseline Limited cervical rotation with left upper trap tightness    Time 4    Period Weeks    Status On-going    Target Date 10/15/19      PT SHORT TERM GOAL #3   Title Pt will report <5/10 pain at worst with work activities.    Baseline Patient reports 6/10 pain level this visit    Time 4    Period Weeks    Status On-going    Target Date 10/15/19             PT Long Term Goals - 09/17/19 1358      PT LONG TERM GOAL #1   Title Pt will increase B shoulder MMT to 4+/5 without pain or compensation, as needed for job duties.    Baseline Continued periscapular weakness    Time 8    Period Weeks    Status On-going    Target Date 11/12/19      PT LONG TERM GOAL #2   Title Pt will demonstrate pain free cervical AROM WFL and with good postural alignment.    Baseline Continued cervical motion limitation    Time 8    Period Weeks    Status On-going    Target Date 11/12/19      PT LONG TERM GOAL #3   Title Pt will have no occurrence of headaches/ability to prevent onset of HA with HEP performance.    Baseline Continued headaches at least 1x/week    Time 8    Period Weeks    Status On-going    Target Date 11/12/19      PT LONG TERM GOAL #4   Title Pt will return to work full time with no c/o limitation from  pain.    Baseline Continued limitation at work    Time 8    Period Weeks    Status On-going    Target Date 11/12/19                 Plan - 09/17/19 1353    Clinical Impression Statement Patient tolerated therapy well with no adverse effects. She continues to report neck and left upper trap pain, increased tightness left upper trap/levator, postural deviations and weakness. She did note improvement following manual and she was given tennis balls to perform self TPR at home. Updated HEP with good tolerance and patient will schedule 1x/week for dry needling progression of exeercises. She would benefit from continued skilled PT to progress motion and postural control to reduce pain and maximize functional level.    Personal Factors and Comorbidities Age;Time since onset of injury/illness/exacerbation;Past/Current Experience;Profession    Examination-Activity Limitations Sit;Sleep;Carry;Lift    Examination-Participation Restrictions Driving;Community Activity;Shop;Cleaning;Laundry    Rehab Potential Good    PT Frequency 1x / week    PT Duration 8 weeks    PT Treatment/Interventions Spinal Manipulations;Joint Manipulations;Taping;Dry needling;Passive range of motion;Manual techniques;Patient/family education;Therapeutic exercise;Neuromuscular re-education;Therapeutic  activities;Functional mobility training;Traction;Moist Heat;Iontophoresis 4mg /ml Dexamethasone;ADLs/Self Care Home Management;Cryotherapy;Electrical Stimulation    PT Next Visit Plan Assess HEP and progress PRN, dry needling and manual, postural strengthening    PT Home Exercise Plan [email or web address]    Consulted and Agree with Plan of Care Patient           Patient will benefit from skilled therapeutic intervention in order to improve the following deficits and impairments:  Pain, Postural dysfunction, Impaired flexibility, Increased fascial restricitons, Decreased strength, Decreased activity tolerance, Improper body mechanics,  Impaired perceived functional ability, Hypomobility  Visit Diagnosis: Fusion of spine, cervical region - Plan: PT plan of care cert/re-cert  Abnormal posture - Plan: PT plan of care cert/re-cert  Muscle weakness (generalized) - Plan: PT plan of care cert/re-cert     Problem List Patient Active Problem List   Diagnosis Date Noted  . Radiculopathy 04/26/2019  . Cervical myelopathy ([place]) 03/07/2019  . Weakness 03/01/2019  . Headache 02/19/2019  . Shoulder pain, left 12/07/2018  . Iron deficiency anemia 11/07/2018  . Elevated blood pressure reading 11/07/2018  . Preventative health care 11/07/2018  . Postoperative state 04/16/2013  . Hematometra 01/09/2013  . Menorrhagia 01/09/2013  . Chronic pelvic pain in female 01/09/2013    Hilda Blades, PT, DPT, LAT, ATC 09/17/19  3:09 PM Phone: [phone] Fax: [place] [facility] [address] [place], [place], [address] Phone: [phone]   Fax:  [phone]  Name: [name] MRN: [id number] Date of Birth: February 10, 1970

## 2019-10-01 ENCOUNTER — Other Ambulatory Visit: Payer: Self-pay

## 2019-10-01 ENCOUNTER — Encounter: Payer: Self-pay | Admitting: Physical Therapy

## 2019-10-01 ENCOUNTER — Ambulatory Visit: Payer: Medicaid Other | Attending: Orthopedic Surgery | Admitting: Physical Therapy

## 2019-10-01 DIAGNOSIS — R293 Abnormal posture: Secondary | ICD-10-CM

## 2019-10-01 DIAGNOSIS — M4322 Fusion of spine, cervical region: Secondary | ICD-10-CM | POA: Diagnosis present

## 2019-10-01 DIAGNOSIS — M6281 Muscle weakness (generalized): Secondary | ICD-10-CM | POA: Insufficient documentation

## 2019-10-01 NOTE — Therapy (Signed)
[place], [place], [address] Phone: [phone]   Fax:  [phone]  Physical Therapy Treatment  Patient Details  Name: [name] MRN: [id number] Date of Birth: 1970-12-08 Referring Provider (PT): Phylliss Bob, MD   Encounter Date: 10/01/2019   PT End of Session - 10/01/19 1452    Visit Number 5    Number of Visits 12    Date for PT Re-Evaluation 11/12/19    Authorization Type M[employer]    PT Start Time 1145    PT Stop Time 1227    PT Time Calculation (min) 42 min    Activity Tolerance Patient tolerated treatment well    Behavior During Therapy [facility] for tasks assessed/performed           Past Medical History:  Diagnosis Date  . Anemia   . Fibroids, submucosal   . History of blood transfusion 02/2011   [facility] - ? 2-3 units transfused    Past Surgical History:  Procedure Laterality Date  . ANTERIOR CERVICAL DECOMPRESSION/DISCECTOMY FUSION 4 LEVELS N/A 04/26/2019   Procedure: ANTERIOR CERVICAL DECOMPRESSION FUSION CERVICAL 4-5, CERVICAL 5-6, CERVICAL 6-7 WITH INSTRUMENTATION AND ALLOGRAFT;  Surgeon: Phylliss Bob, MD;  Location: [facility];  Service: Orthopedics;  Laterality: N/A;  . APPENDECTOMY    . BILATERAL SALPINGECTOMY Bilateral 04/16/2013   Procedure: BILATERAL SALPINGECTOMY;  Surgeon: Lavonia Drafts, MD;  Location: [facility];  Service: Gynecology;  Laterality: Bilateral;  . [place], 2002  . DILATION AND CURETTAGE OF UTERUS N/A 03/31/2012   Procedure: DILATATION AND CURETTAGE;  Surgeon: Emily Filbert, MD;  Location: [facility];  Service: Gynecology;  Laterality: N/A;  . LAPAROSCOPIC LYSIS OF ADHESIONS N/A 03/31/2012   Procedure: LAPAROSCOPIC LYSIS OF ADHESIONS;  Surgeon: Emily Filbert, MD;  Location: [facility];  Service: Gynecology;  Laterality: N/A;  . LAPAROSCOPY N/A 03/31/2012   Procedure: LAPAROSCOPY OPERATIVE;  Surgeon: Emily Filbert, MD;  Location: [facility];  Service: Gynecology;   Laterality: N/A;  . NOVASURE ABLATION N/A 03/31/2012   Procedure: NOVASURE ABLATION;  Surgeon: Emily Filbert, MD;  Location: [facility];  Service: Gynecology;  Laterality: N/A;  . ROBOTIC ASSISTED TOTAL HYSTERECTOMY N/A 04/16/2013   Procedure: ROBOTIC ASSISTED TOTAL HYSTERECTOMY;  Surgeon: Lavonia Drafts, MD;  Location: [facility];  Service: Gynecology;  Laterality: N/A;  . TUBAL LIGATION  2002  . WISDOM TOOTH EXTRACTION      There were no vitals filed for this visit.   Subjective Assessment - 10/01/19 1448    Subjective Patient reports that on Saturday she began having some soreness on the left side. She worked on her stretches which helped some but she is still tight and sore.    Pertinent History Hysterectomy, 2 cesareans, appendectomy    Limitations Sitting;Lifting;House hold activities    Patient Stated Goals Be able to take control of the pain and continue to heal to 100%    Currently in Pain? Yes    Pain Score 6     Pain Location Neck    Pain Orientation Left    Pain Descriptors / Indicators Aching    Pain Type Chronic pain    Pain Radiating Towards left shoulder and upper trap    Pain Onset More than a month ago    Pain Frequency Constant    Aggravating Factors  sriving, sleeping, looking down,    Pain Relieving Factors heat, massage    Effect of Pain on Daily Activities pateitn  limited with sitting and work                             [employer] Adult PT Treatment/Exercise - 10/01/19 0001      Neck Exercises: Machines for Strengthening   UBE (Upper Arm Bike) L1 x 6 min (3 fwd/bwd)      Neck Exercises: Standing   Other Standing Exercises scap retraction x20 red; shoulder extnesion red x20       Manual Therapy   Soft tissue mobilization Left upper trap; sub-occipitals, IASTYM to left upper trap;     Manual Traction With suboccipital release      Neck Exercises: Stretches   Upper Trapezius Stretch Left;2 reps;20 seconds    Other Neck Stretches 2x20 sec hold  left             Trigger Point Dry Needling - 10/01/19 0001    Consent Given? Yes    Muscles Treated Head and Neck Upper trapezius;Levator scapulae    Upper Trapezius Response Twitch reponse elicited;Palpable increased muscle length    Levator Scapulae Response Twitch response elicited                PT Education - 10/01/19 1451    Education Details reviewed HEP and symptom management    Person(s) Educated Patient    Methods Explanation;Demonstration;Tactile cues;Verbal cues    Comprehension Returned demonstration;Verbal cues required;Tactile cues required;Verbalized understanding            PT Short Term Goals - 09/17/19 1357      PT SHORT TERM GOAL #1   Title Pt will be independent and compliant with initial HEP.    Time 1    Period Weeks    Status Achieved    Target Date 08/21/19      PT SHORT TERM GOAL #2   Title Pt will demonstrates pain free B cervical rotation to >/= 65 degrees.    Baseline Limited cervical rotation with left upper trap tightness    Time 4    Period Weeks    Status On-going    Target Date 10/15/19      PT SHORT TERM GOAL #3   Title Pt will report <5/10 pain at worst with work activities.    Baseline Patient reports 6/10 pain level this visit    Time 4    Period Weeks    Status On-going    Target Date 10/15/19             PT Long Term Goals - 09/17/19 1358      PT LONG TERM GOAL #1   Title Pt will increase B shoulder MMT to 4+/5 without pain or compensation, as needed for job duties.    Baseline Continued periscapular weakness    Time 8    Period Weeks    Status On-going    Target Date 11/12/19      PT LONG TERM GOAL #2   Title Pt will demonstrate pain free cervical AROM WFL and with good postural alignment.    Baseline Continued cervical motion limitation    Time 8    Period Weeks    Status On-going    Target Date 11/12/19      PT LONG TERM GOAL #3   Title Pt will have no occurrence of headaches/ability to prevent  onset of HA with HEP performance.    Baseline Continued headaches at least 1x/week    Time  8    Period Weeks    Status On-going    Target Date 11/12/19      PT LONG TERM GOAL #4   Title Pt will return to work full time with no c/o limitation from pain.    Baseline Continued limitation at work    Time 8    Period Weeks    Status On-going    Target Date 11/12/19                 Plan - 10/01/19 1453    Clinical Impression Statement Patient had a great twitch response in the left upper trap and left cervical paraspinals. She reported improvement after the last needling session. Therapy reviewed ther-ex and stretching. She reported improved pain after needling. Therapy perfromed sub-occipital release and soft issue mobilization to the upper trap. The patient remains motivated to improve. She has returned to the gym.    Personal Factors and Comorbidities Age;Time since onset of injury/illness/exacerbation;Past/Current Experience;Profession    Comorbidities Anemia    Examination-Activity Limitations Sit;Sleep;Carry;Lift    Examination-Participation Restrictions Driving;Community Activity;Shop;Cleaning;Laundry    Stability/Clinical Decision Making Stable/Uncomplicated    Clinical Decision Making Low    Rehab Potential Good    PT Frequency 1x / week    PT Duration 8 weeks    PT Treatment/Interventions Spinal Manipulations;Joint Manipulations;Taping;Dry needling;Passive range of motion;Manual techniques;Patient/family education;Therapeutic exercise;Neuromuscular re-education;Therapeutic activities;Functional mobility training;Traction;Moist Heat;Iontophoresis 4mg /ml Dexamethasone;ADLs/Self Care Home Management;Cryotherapy;Electrical Stimulation    PT Next Visit Plan Assess HEP and progress PRN, dry needling and manual, postural strengthening    PT Home Exercise Plan [id number]    Consulted and Agree with Plan of Care Patient           Patient will benefit from skilled therapeutic  intervention in order to improve the following deficits and impairments:  Pain, Postural dysfunction, Impaired flexibility, Increased fascial restricitons, Decreased strength, Decreased activity tolerance, Improper body mechanics, Impaired perceived functional ability, Hypomobility  Visit Diagnosis: Fusion of spine, cervical region  Abnormal posture  Muscle weakness (generalized)     Problem List Patient Active Problem List   Diagnosis Date Noted  . Radiculopathy 04/26/2019  . Cervical myelopathy ([place]) 03/07/2019  . Weakness 03/01/2019  . Headache 02/19/2019  . Shoulder pain, left 12/07/2018  . Iron deficiency anemia 11/07/2018  . Elevated blood pressure reading 11/07/2018  . Preventative health care 11/07/2018  . Postoperative state 04/16/2013  . Hematometra 01/09/2013  . Menorrhagia 01/09/2013  . Chronic pelvic pain in female 01/09/2013    Carney Living PT DPT  10/01/2019, 3:00 PM  [place], [place], [address] Phone: [phone]   Fax:  [phone]  Name: [name] MRN: [id number] Date of Birth: 09-Aug-1970

## 2019-10-09 ENCOUNTER — Other Ambulatory Visit: Payer: Self-pay

## 2019-10-09 ENCOUNTER — Ambulatory Visit: Payer: Medicaid Other | Admitting: Physical Therapy

## 2019-10-09 ENCOUNTER — Encounter: Payer: Self-pay | Admitting: Physical Therapy

## 2019-10-09 DIAGNOSIS — R293 Abnormal posture: Secondary | ICD-10-CM

## 2019-10-09 DIAGNOSIS — M6281 Muscle weakness (generalized): Secondary | ICD-10-CM

## 2019-10-09 DIAGNOSIS — M4322 Fusion of spine, cervical region: Secondary | ICD-10-CM | POA: Diagnosis not present

## 2019-10-09 NOTE — Therapy (Signed)
[place], [place], [address] Phone: [phone]   Fax:  [phone]  Physical Therapy Treatment  Patient Details  Name: [name] MRN: [id number] Date of Birth: 1970-12-05 Referring Provider (PT): Phylliss Bob, MD   Encounter Date: 10/09/2019   PT End of Session - 10/09/19 1351    Visit Number 6    Number of Visits 12    Date for PT Re-Evaluation 11/12/19    Authorization Type MCD [employer]    Authorization - Visit Number 5    Authorization - Number of Visits 27    PT Start Time 5284    PT Stop Time 1440    PT Time Calculation (min) 45 min    Activity Tolerance Patient tolerated treatment well    Behavior During Therapy [facility] for tasks assessed/performed           Past Medical History:  Diagnosis Date  . Anemia   . Fibroids, submucosal   . History of blood transfusion 02/2011   [facility] - ? 2-3 units transfused    Past Surgical History:  Procedure Laterality Date  . ANTERIOR CERVICAL DECOMPRESSION/DISCECTOMY FUSION 4 LEVELS N/A 04/26/2019   Procedure: ANTERIOR CERVICAL DECOMPRESSION FUSION CERVICAL 4-5, CERVICAL 5-6, CERVICAL 6-7 WITH INSTRUMENTATION AND ALLOGRAFT;  Surgeon: Phylliss Bob, MD;  Location: [facility];  Service: Orthopedics;  Laterality: N/A;  . APPENDECTOMY    . BILATERAL SALPINGECTOMY Bilateral 04/16/2013   Procedure: BILATERAL SALPINGECTOMY;  Surgeon: Lavonia Drafts, MD;  Location: [facility];  Service: Gynecology;  Laterality: Bilateral;  . [place], 2002  . DILATION AND CURETTAGE OF UTERUS N/A 03/31/2012   Procedure: DILATATION AND CURETTAGE;  Surgeon: Emily Filbert, MD;  Location: [facility];  Service: Gynecology;  Laterality: N/A;  . LAPAROSCOPIC LYSIS OF ADHESIONS N/A 03/31/2012   Procedure: LAPAROSCOPIC LYSIS OF ADHESIONS;  Surgeon: Emily Filbert, MD;  Location: [facility];  Service: Gynecology;  Laterality: N/A;  . LAPAROSCOPY N/A 03/31/2012   Procedure: LAPAROSCOPY OPERATIVE;   Surgeon: Emily Filbert, MD;  Location: [facility];  Service: Gynecology;  Laterality: N/A;  . NOVASURE ABLATION N/A 03/31/2012   Procedure: NOVASURE ABLATION;  Surgeon: Emily Filbert, MD;  Location: [facility];  Service: Gynecology;  Laterality: N/A;  . ROBOTIC ASSISTED TOTAL HYSTERECTOMY N/A 04/16/2013   Procedure: ROBOTIC ASSISTED TOTAL HYSTERECTOMY;  Surgeon: Lavonia Drafts, MD;  Location: [facility];  Service: Gynecology;  Laterality: N/A;  . TUBAL LIGATION  2002  . WISDOM TOOTH EXTRACTION      There were no vitals filed for this visit.   Subjective Assessment - 10/09/19 1350    Subjective Patient reports left sided tightness has improved but has been having increased achiness of her neck and shoulder, and she is currently experiencing a migraine for the past 2 days.    Patient Stated Goals Be able to take control of the pain and continue to heal to 100%    Currently in Pain? Yes    Pain Score 6     Pain Location Neck    Pain Orientation Left    Pain Descriptors / Indicators Aching;Tightness    Pain Type Chronic pain    Pain Radiating Towards left upper trap region    Pain Onset More than a month ago    Pain Frequency Constant              [facility] PT Assessment - 10/09/19 0001      Assessment  Medical Diagnosis ACDF C4-7    Referring Provider (PT) Phylliss Bob, MD    Onset Date/Surgical Date 04/26/19    Next MD Visit Patient unsure of next appointment      AROM   Cervical - Right Side Bend 30    Cervical - Left Rotation 55                         [facility] Adult PT Treatment/Exercise - 10/09/19 0001      Exercises   Exercises Neck      Neck Exercises: Machines for Strengthening   UBE (Upper Arm Bike) L1 x 6 min (3 fwd/bwd)      Neck Exercises: Theraband   Shoulder Extension 20 reps    Shoulder Extension Limitations yellow    Rows 20 reps    Rows Limitations yellow    Shoulder External Rotation 10 reps   2 sets   Shoulder External Rotation Limitations  yellow      Modalities   Modalities Moist Heat      Moist Heat Therapy   Number Minutes Moist Heat 5 Minutes    Moist Heat Location Cervical      Manual Therapy   Manual Therapy Soft tissue mobilization;Myofascial release;Passive ROM;Manual Traction    Soft tissue mobilization Left upper trap/levator scap and bilateral cervical paraspinals    Myofascial Release Suboccipital release    Passive ROM Upper trap and levator scap stretching    Manual Traction With suboccipital release      Neck Exercises: Stretches   Upper Trapezius Stretch 20 seconds    Levator Stretch 20 seconds                  PT Education - 10/09/19 1351    Education Details HEP    Person(s) Educated Patient    Methods Explanation    Comprehension Verbalized understanding;Need further instruction            PT Short Term Goals - 09/17/19 1357      PT SHORT TERM GOAL #1   Title Pt will be independent and compliant with initial HEP.    Time 1    Period Weeks    Status Achieved    Target Date 08/21/19      PT SHORT TERM GOAL #2   Title Pt will demonstrates pain free B cervical rotation to >/= 65 degrees.    Baseline Limited cervical rotation with left upper trap tightness    Time 4    Period Weeks    Status On-going    Target Date 10/15/19      PT SHORT TERM GOAL #3   Title Pt will report <5/10 pain at worst with work activities.    Baseline Patient reports 6/10 pain level this visit    Time 4    Period Weeks    Status On-going    Target Date 10/15/19             PT Long Term Goals - 09/17/19 1358      PT LONG TERM GOAL #1   Title Pt will increase B shoulder MMT to 4+/5 without pain or compensation, as needed for job duties.    Baseline Continued periscapular weakness    Time 8    Period Weeks    Status On-going    Target Date 11/12/19      PT LONG TERM GOAL #2   Title Pt will demonstrate pain free cervical AROM  WFL and with good postural alignment.    Baseline Continued  cervical motion limitation    Time 8    Period Weeks    Status On-going    Target Date 11/12/19      PT LONG TERM GOAL #3   Title Pt will have no occurrence of headaches/ability to prevent onset of HA with HEP performance.    Baseline Continued headaches at least 1x/week    Time 8    Period Weeks    Status On-going    Target Date 11/12/19      PT LONG TERM GOAL #4   Title Pt will return to work full time with no c/o limitation from pain.    Baseline Continued limitation at work    Time 8    Period Weeks    Status On-going    Target Date 11/12/19                 Plan - 10/09/19 1352    Clinical Impression Statement Patient tolerated therapy well with no adverse effects. Therapy focused mainly on manual therapy to reduce muscular tension and headache. Patient did report improvement in symptoms following therapy. Continued with postural strengthening but did not progress resistance this visit. She would benefit from continued skilled PT to progress motion and postural control to reduce pain and maximize functional level.    PT Treatment/Interventions Spinal Manipulations;Joint Manipulations;Taping;Dry needling;Passive range of motion;Manual techniques;Patient/family education;Therapeutic exercise;Neuromuscular re-education;Therapeutic activities;Functional mobility training;Traction;Moist Heat;Iontophoresis 4mg /ml Dexamethasone;ADLs/Self Care Home Management;Cryotherapy;Electrical Stimulation    PT Next Visit Plan Assess HEP and progress PRN, dry needling and manual for left upper trap/levator/suboccipitals, postural strengthening    PT Home Exercise Plan [id number]    Consulted and Agree with Plan of Care Patient           Patient will benefit from skilled therapeutic intervention in order to improve the following deficits and impairments:  Pain, Postural dysfunction, Impaired flexibility, Increased fascial restricitons, Decreased strength, Decreased activity tolerance, Improper  body mechanics, Impaired perceived functional ability, Hypomobility  Visit Diagnosis: Fusion of spine, cervical region  Abnormal posture  Muscle weakness (generalized)     Problem List Patient Active Problem List   Diagnosis Date Noted  . Radiculopathy 04/26/2019  . Cervical myelopathy ([place]) 03/07/2019  . Weakness 03/01/2019  . Headache 02/19/2019  . Shoulder pain, left 12/07/2018  . Iron deficiency anemia 11/07/2018  . Elevated blood pressure reading 11/07/2018  . Preventative health care 11/07/2018  . Postoperative state 04/16/2013  . Hematometra 01/09/2013  . Menorrhagia 01/09/2013  . Chronic pelvic pain in female 01/09/2013    Hilda Blades, PT, DPT, LAT, ATC 10/09/19  2:42 PM Phone: [phone] Fax: [facility] [address] [place], [place], [address] Phone: [phone]   Fax:  [phone]  Name: CANDITA BORENSTEIN MRN: [id number] Date of Birth: Feb 21, 1970

## 2019-10-15 ENCOUNTER — Other Ambulatory Visit: Payer: Self-pay

## 2019-10-15 ENCOUNTER — Encounter: Payer: Self-pay | Admitting: Physical Therapy

## 2019-10-15 ENCOUNTER — Ambulatory Visit: Payer: Medicaid Other | Admitting: Physical Therapy

## 2019-10-15 DIAGNOSIS — M4322 Fusion of spine, cervical region: Secondary | ICD-10-CM

## 2019-10-15 DIAGNOSIS — M6281 Muscle weakness (generalized): Secondary | ICD-10-CM

## 2019-10-15 DIAGNOSIS — R293 Abnormal posture: Secondary | ICD-10-CM

## 2019-10-16 ENCOUNTER — Encounter: Payer: Self-pay | Admitting: Physical Therapy

## 2019-10-16 NOTE — Therapy (Signed)
[place], [place], [address] Phone: [phone]   Fax:  [phone]  Physical Therapy Treatment  Patient Details  Name: [name] MRN: [id number] Date of Birth: 1970-02-18 Referring Provider (PT): Phylliss Bob, MD   Encounter Date: 10/15/2019   PT End of Session - 10/15/19 1615    Visit Number 7    Number of Visits 12    Date for PT Re-Evaluation 11/12/19    Authorization Type [employer]    PT Start Time 1147    PT Stop Time 1233    PT Time Calculation (min) 46 min    Activity Tolerance Patient tolerated treatment well    Behavior During Therapy [facility] for tasks assessed/performed           Past Medical History:  Diagnosis Date  . Anemia   . Fibroids, submucosal   . History of blood transfusion 02/2011   [facility] - ? 2-3 units transfused    Past Surgical History:  Procedure Laterality Date  . ANTERIOR CERVICAL DECOMPRESSION/DISCECTOMY FUSION 4 LEVELS N/A 04/26/2019   Procedure: ANTERIOR CERVICAL DECOMPRESSION FUSION CERVICAL 4-5, CERVICAL 5-6, CERVICAL 6-7 WITH INSTRUMENTATION AND ALLOGRAFT;  Surgeon: Phylliss Bob, MD;  Location: [facility];  Service: Orthopedics;  Laterality: N/A;  . APPENDECTOMY    . BILATERAL SALPINGECTOMY Bilateral 04/16/2013   Procedure: BILATERAL SALPINGECTOMY;  Surgeon: Lavonia Drafts, MD;  Location: [facility];  Service: Gynecology;  Laterality: Bilateral;  . [place], 2002  . DILATION AND CURETTAGE OF UTERUS N/A 03/31/2012   Procedure: DILATATION AND CURETTAGE;  Surgeon: Emily Filbert, MD;  Location: [facility];  Service: Gynecology;  Laterality: N/A;  . LAPAROSCOPIC LYSIS OF ADHESIONS N/A 03/31/2012   Procedure: LAPAROSCOPIC LYSIS OF ADHESIONS;  Surgeon: Emily Filbert, MD;  Location: [facility];  Service: Gynecology;  Laterality: N/A;  . LAPAROSCOPY N/A 03/31/2012   Procedure: LAPAROSCOPY OPERATIVE;  Surgeon: Emily Filbert, MD;  Location: [facility];  Service: Gynecology;   Laterality: N/A;  . NOVASURE ABLATION N/A 03/31/2012   Procedure: NOVASURE ABLATION;  Surgeon: Emily Filbert, MD;  Location: [facility];  Service: Gynecology;  Laterality: N/A;  . ROBOTIC ASSISTED TOTAL HYSTERECTOMY N/A 04/16/2013   Procedure: ROBOTIC ASSISTED TOTAL HYSTERECTOMY;  Surgeon: Lavonia Drafts, MD;  Location: [facility];  Service: Gynecology;  Laterality: N/A;  . TUBAL LIGATION  2002  . WISDOM TOOTH EXTRACTION      There were no vitals filed for this visit.   Subjective Assessment - 10/15/19 1612    Subjective Patient reports she went out last night and got sitff and sore. she has not had the heachache come back. Overall she feels like it is looser but still tight on the left side.    Pertinent History Hysterectomy, 2 cesareans, appendectomy    Limitations Sitting;Lifting;House hold activities    Patient Stated Goals Be able to take control of the pain and continue to heal to 100%    Currently in Pain? Yes    Pain Score 3     Pain Location Neck    Pain Orientation Left    Pain Descriptors / Indicators Aching    Pain Type Chronic pain    Pain Radiating Towards left upper trap    Pain Onset More than a month ago    Pain Frequency Constant    Aggravating Factors  driving and sleeping, looking down    Pain Relieving Factors heat and massage    Effect of  Pain on Daily Activities limited during sitting                             [facility] Adult PT Treatment/Exercise - 10/16/19 0001      Neck Exercises: Standing   Other Standing Exercises scap retraction x20 red; shoulder extnesion red x20       Manual Therapy   Manual Therapy Soft tissue mobilization;Myofascial release;Passive ROM;Manual Traction    Soft tissue mobilization Left upper trap/levator scap and bilateral cervical paraspinals using IASTYM     Myofascial Release Suboccipital release    Passive ROM Upper trap and levator scap stretching    Manual Traction With suboccipital release      Neck  Exercises: Stretches   Upper Trapezius Stretch 20 seconds    Levator Stretch 20 seconds            Trigger Point Dry Needling - 10/16/19 0001    Consent Given? Yes    Muscles Treated Head and Neck Cervical multifidi    Other Dry Needling 2 spots in the left upper trapa nd c5 c-6 left cervical multifidi     Upper Trapezius Response Twitch reponse elicited;Palpable increased muscle length    Cervical multifidi Response Palpable increased muscle length;Twitch reponse elicited                PT Education - 10/15/19 1614    Education Details reviewed benefit and risk of TPDN    Person(s) Educated Patient    Methods Explanation;Demonstration;Tactile cues;Verbal cues    Comprehension Verbalized understanding;Verbal cues required;Tactile cues required;Returned demonstration            PT Short Term Goals - 09/17/19 1357      PT SHORT TERM GOAL #1   Title Pt will be independent and compliant with initial HEP.    Time 1    Period Weeks    Status Achieved    Target Date 08/21/19      PT SHORT TERM GOAL #2   Title Pt will demonstrates pain free B cervical rotation to >/= 65 degrees.    Baseline Limited cervical rotation with left upper trap tightness    Time 4    Period Weeks    Status On-going    Target Date 10/15/19      PT SHORT TERM GOAL #3   Title Pt will report <5/10 pain at worst with work activities.    Baseline Patient reports 6/10 pain level this visit    Time 4    Period Weeks    Status On-going    Target Date 10/15/19             PT Long Term Goals - 09/17/19 1358      PT LONG TERM GOAL #1   Title Pt will increase B shoulder MMT to 4+/5 without pain or compensation, as needed for job duties.    Baseline Continued periscapular weakness    Time 8    Period Weeks    Status On-going    Target Date 11/12/19      PT LONG TERM GOAL #2   Title Pt will demonstrate pain free cervical AROM WFL and with good postural alignment.    Baseline Continued  cervical motion limitation    Time 8    Period Weeks    Status On-going    Target Date 11/12/19      PT LONG TERM GOAL #3   Title  Pt will have no occurrence of headaches/ability to prevent onset of HA with HEP performance.    Baseline Continued headaches at least 1x/week    Time 8    Period Weeks    Status On-going    Target Date 11/12/19      PT LONG TERM GOAL #4   Title Pt will return to work full time with no c/o limitation from pain.    Baseline Continued limitation at work    Time 8    Period Weeks    Status On-going    Target Date 11/12/19                 Plan - 10/15/19 1615    Clinical Impression Statement Patient had a great twitch response to dry needling. She reppoted decreased pain and improved range with manual therapy. Therapy mostly focused on manualt hterapy today but did review stretching and light exercises. She was encouraged to continue to work on her home exercises and stretching. therapy will continue to advance ther-ex as tolerated.    Personal Factors and Comorbidities Age;Time since onset of injury/illness/exacerbation;Past/Current Experience;Profession    Examination-Activity Limitations Sit;Sleep;Carry;Lift    Examination-Participation Restrictions Driving;Community Activity;Shop;Cleaning;Laundry    Stability/Clinical Decision Making Stable/Uncomplicated    Clinical Decision Making Low    PT Frequency 1x / week    PT Duration 8 weeks    PT Treatment/Interventions Spinal Manipulations;Joint Manipulations;Taping;Dry needling;Passive range of motion;Manual techniques;Patient/family education;Therapeutic exercise;Neuromuscular re-education;Therapeutic activities;Functional mobility training;Traction;Moist Heat;Iontophoresis 4mg /ml Dexamethasone;ADLs/Self Care Home Management;Cryotherapy;Electrical Stimulation           Patient will benefit from skilled therapeutic intervention in order to improve the following deficits and impairments:  Pain,  Postural dysfunction, Impaired flexibility, Increased fascial restricitons, Decreased strength, Decreased activity tolerance, Improper body mechanics, Impaired perceived functional ability, Hypomobility  Visit Diagnosis: Fusion of spine, cervical region  Abnormal posture  Muscle weakness (generalized)     Problem List Patient Active Problem List   Diagnosis Date Noted  . Radiculopathy 04/26/2019  . Cervical myelopathy ([place]) 03/07/2019  . Weakness 03/01/2019  . Headache 02/19/2019  . Shoulder pain, left 12/07/2018  . Iron deficiency anemia 11/07/2018  . Elevated blood pressure reading 11/07/2018  . Preventative health care 11/07/2018  . Postoperative state 04/16/2013  . Hematometra 01/09/2013  . Menorrhagia 01/09/2013  . Chronic pelvic pain in female 01/09/2013    Carney Living 10/16/2019, 8:29 AM  [place], [place], [address] Phone: [phone]   Fax:  [phone]  Name: [name] MRN: [id number] Date of Birth: 07-06-1970

## 2019-10-29 ENCOUNTER — Encounter: Payer: Self-pay | Admitting: Physical Therapy

## 2019-10-29 ENCOUNTER — Ambulatory Visit: Payer: Medicaid Other | Attending: Orthopedic Surgery | Admitting: Physical Therapy

## 2019-10-29 ENCOUNTER — Other Ambulatory Visit: Payer: Self-pay

## 2019-10-29 DIAGNOSIS — M6281 Muscle weakness (generalized): Secondary | ICD-10-CM | POA: Diagnosis present

## 2019-10-29 DIAGNOSIS — M4322 Fusion of spine, cervical region: Secondary | ICD-10-CM | POA: Insufficient documentation

## 2019-10-29 DIAGNOSIS — R293 Abnormal posture: Secondary | ICD-10-CM | POA: Diagnosis present

## 2019-10-29 NOTE — Therapy (Signed)
[place], [place], [address] Phone: [phone]   Fax:  [phone]  Physical Therapy Treatment  Patient Details  Name: [name] MRN: [id number] Date of Birth: [birth year] Referring Provider (PT): Phylliss Bob, MD   Encounter Date: 10/29/2019   PT End of Session - 10/29/19 1444    Visit Number 8    Number of Visits 12    Date for PT Re-Evaluation 11/12/19    Authorization Type [employer]    Authorization - Visit Number 8    PT Start Time 1146    PT Stop Time 1228    PT Time Calculation (min) 42 min    Activity Tolerance Patient tolerated treatment well    Behavior During Therapy [facility] for tasks assessed/performed           Past Medical History:  Diagnosis Date  . Anemia   . Fibroids, submucosal   . History of blood transfusion 02/2011   [facility] - ? 2-3 units transfused    Past Surgical History:  Procedure Laterality Date  . ANTERIOR CERVICAL DECOMPRESSION/DISCECTOMY FUSION 4 LEVELS N/A 04/26/2019   Procedure: ANTERIOR CERVICAL DECOMPRESSION FUSION CERVICAL 4-5, CERVICAL 5-6, CERVICAL 6-7 WITH INSTRUMENTATION AND ALLOGRAFT;  Surgeon: Phylliss Bob, MD;  Location: [facility];  Service: Orthopedics;  Laterality: N/A;  . APPENDECTOMY    . BILATERAL SALPINGECTOMY Bilateral 04/16/2013   Procedure: BILATERAL SALPINGECTOMY;  Surgeon: Lavonia Drafts, MD;  Location: [facility];  Service: Gynecology;  Laterality: Bilateral;  . [place], 2002  . DILATION AND CURETTAGE OF UTERUS N/A 03/31/2012   Procedure: DILATATION AND CURETTAGE;  Surgeon: Emily Filbert, MD;  Location: [facility];  Service: Gynecology;  Laterality: N/A;  . LAPAROSCOPIC LYSIS OF ADHESIONS N/A 03/31/2012   Procedure: LAPAROSCOPIC LYSIS OF ADHESIONS;  Surgeon: Emily Filbert, MD;  Location: [facility];  Service: Gynecology;  Laterality: N/A;  . LAPAROSCOPY N/A 03/31/2012   Procedure: LAPAROSCOPY OPERATIVE;  Surgeon: Emily Filbert, MD;  Location:  [facility];  Service: Gynecology;  Laterality: N/A;  . NOVASURE ABLATION N/A 03/31/2012   Procedure: NOVASURE ABLATION;  Surgeon: Emily Filbert, MD;  Location: [facility];  Service: Gynecology;  Laterality: N/A;  . ROBOTIC ASSISTED TOTAL HYSTERECTOMY N/A 04/16/2013   Procedure: ROBOTIC ASSISTED TOTAL HYSTERECTOMY;  Surgeon: Lavonia Drafts, MD;  Location: [facility];  Service: Gynecology;  Laterality: N/A;  . TUBAL LIGATION  2002  . WISDOM TOOTH EXTRACTION      There were no vitals filed for this visit.   Subjective Assessment - 10/29/19 1442    Subjective Patient reports her neck has just been stiff. She has not had much pain or any headaches. She has been working on her stretching and exercises.    Pertinent History Hysterectomy, 2 cesareans, appendectomy    Limitations Sitting;Lifting;House hold activities    Patient Stated Goals Be able to take control of the pain and continue to heal to 100%    Currently in Pain? No/denies   just stiffness                            [facility] Adult PT Treatment/Exercise - 10/29/19 0001      Neck Exercises: Standing   Other Standing Exercises scap retraction x20 green; shoulder extnesion green x20       Neck Exercises: Seated   Other Seated Exercise bilateral ER green 2x10; horizontal abduction 2x10; band flexion green 2x10  Manual Therapy   Manual Therapy Soft tissue mobilization;Myofascial release;Passive ROM;Manual Traction    Soft tissue mobilization Left upper trap/levator scap and bilateral cervical paraspinals using IASTYM     Myofascial Release Suboccipital release    Passive ROM Upper trap and levator scap stretching    Manual Traction With suboccipital release            Trigger Point Dry Needling - 10/29/19 0001    Other Dry Needling 4 spots in the left upper trapa nd c5 c-6 left and right upper trap     Cervical multifidi Response Palpable increased muscle length;Twitch reponse elicited                PT  Education - 10/29/19 1443    Education Details reviewed benefits of posterior chain strengthenign.    Person(s) Educated Patient    Methods Explanation;Demonstration;Tactile cues;Verbal cues    Comprehension Verbalized understanding;Returned demonstration;Verbal cues required;Tactile cues required            PT Short Term Goals - 09/17/19 1357      PT SHORT TERM GOAL #1   Title Pt will be independent and compliant with initial HEP.    Time 1    Period Weeks    Status Achieved    Target Date 08/21/19      PT SHORT TERM GOAL #2   Title Pt will demonstrates pain free B cervical rotation to >/= 65 degrees.    Baseline Limited cervical rotation with left upper trap tightness    Time 4    Period Weeks    Status On-going    Target Date 10/15/19      PT SHORT TERM GOAL #3   Title Pt will report <5/10 pain at worst with work activities.    Baseline Patient reports 6/10 pain level this visit    Time 4    Period Weeks    Status On-going    Target Date 10/15/19             PT Long Term Goals - 09/17/19 1358      PT LONG TERM GOAL #1   Title Pt will increase B shoulder MMT to 4+/5 without pain or compensation, as needed for job duties.    Baseline Continued periscapular weakness    Time 8    Period Weeks    Status On-going    Target Date 11/12/19      PT LONG TERM GOAL #2   Title Pt will demonstrate pain free cervical AROM WFL and with good postural alignment.    Baseline Continued cervical motion limitation    Time 8    Period Weeks    Status On-going    Target Date 11/12/19      PT LONG TERM GOAL #3   Title Pt will have no occurrence of headaches/ability to prevent onset of HA with HEP performance.    Baseline Continued headaches at least 1x/week    Time 8    Period Weeks    Status On-going    Target Date 11/12/19      PT LONG TERM GOAL #4   Title Pt will return to work full time with no c/o limitation from pain.    Baseline Continued limitation at work     Time 8    Period Weeks    Status On-going    Target Date 11/12/19                 Plan -  10/29/19 1444    Clinical Impression Statement Patient is making good progress. Her tightness seemed to be more around her spine today with less upper trap tightness. Therapy advanced her to green band ther-ex. If she continues to do well we will likley D?C to HEP.    Personal Factors and Comorbidities Age;Time since onset of injury/illness/exacerbation;Past/Current Experience;Profession    Comorbidities Anemia    Examination-Activity Limitations Sit;Sleep;Carry;Lift    Examination-Participation Restrictions Driving;Community Activity;Shop;Cleaning;Laundry    Stability/Clinical Decision Making Stable/Uncomplicated    Clinical Decision Making Low    Rehab Potential Good    PT Frequency 1x / week    PT Duration 8 weeks    PT Treatment/Interventions Spinal Manipulations;Joint Manipulations;Taping;Dry needling;Passive range of motion;Manual techniques;Patient/family education;Therapeutic exercise;Neuromuscular re-education;Therapeutic activities;Functional mobility training;Traction;Moist Heat;Iontophoresis 4mg /ml Dexamethasone;ADLs/Self Care Home Management;Cryotherapy;Electrical Stimulation    PT Next Visit Plan Assess HEP and progress PRN, dry needling and manual for left upper trap/levator/suboccipitals, postural strengthening    PT Home Exercise Plan [id number]    Consulted and Agree with Plan of Care Patient           Patient will benefit from skilled therapeutic intervention in order to improve the following deficits and impairments:  Pain, Postural dysfunction, Impaired flexibility, Increased fascial restricitons, Decreased strength, Decreased activity tolerance, Improper body mechanics, Impaired perceived functional ability, Hypomobility  Visit Diagnosis: Fusion of spine, cervical region  Abnormal posture  Muscle weakness (generalized)     Problem List Patient Active Problem List    Diagnosis Date Noted  . Radiculopathy 04/26/2019  . Cervical myelopathy ([place]) 03/07/2019  . Weakness 03/01/2019  . Headache 02/19/2019  . Shoulder pain, left 12/07/2018  . Iron deficiency anemia 11/07/2018  . Elevated blood pressure reading 11/07/2018  . Preventative health care 11/07/2018  . Postoperative state 04/16/2013  . Hematometra 01/09/2013  . Menorrhagia 01/09/2013  . Chronic pelvic pain in female 01/09/2013    Carney Living PT DPT  10/29/2019, 2:52 PM  [facility] [address] [place], [place], [address] Phone: [phone]   Fax:  [phone]  Name: KAITLYNE FRIEDHOFF MRN: [id number] Date of Birth: 08-26-1970

## 2019-11-05 ENCOUNTER — Other Ambulatory Visit: Payer: Self-pay

## 2019-11-05 ENCOUNTER — Encounter: Payer: Self-pay | Admitting: Physical Therapy

## 2019-11-05 ENCOUNTER — Ambulatory Visit: Payer: Medicaid Other | Admitting: Physical Therapy

## 2019-11-05 DIAGNOSIS — M4322 Fusion of spine, cervical region: Secondary | ICD-10-CM

## 2019-11-05 DIAGNOSIS — R293 Abnormal posture: Secondary | ICD-10-CM

## 2019-11-05 DIAGNOSIS — M6281 Muscle weakness (generalized): Secondary | ICD-10-CM

## 2019-11-05 NOTE — Therapy (Signed)
[place], [place], [address] Phone: [phone]   Fax:  [phone]  Physical Therapy Treatment/Progress Note   Patient Details  Name: [name] MRN: [id number] Date of Birth: 29-Jun-1970 Referring Provider (PT): Phylliss Bob, MD   Encounter Date: 11/05/2019   PT End of Session - 11/05/19 1152    Visit Number 9    Number of Visits 15    Date for PT Re-Evaluation 12/17/19    Authorization Type [employer]    PT Start Time 1100    PT Stop Time 1144    PT Time Calculation (min) 44 min    Activity Tolerance Patient tolerated treatment well    Behavior During Therapy [facility] for tasks assessed/performed           Past Medical History:  Diagnosis Date  . Anemia   . Fibroids, submucosal   . History of blood transfusion 02/2011   [facility] - ? 2-3 units transfused    Past Surgical History:  Procedure Laterality Date  . ANTERIOR CERVICAL DECOMPRESSION/DISCECTOMY FUSION 4 LEVELS N/A 04/26/2019   Procedure: ANTERIOR CERVICAL DECOMPRESSION FUSION CERVICAL 4-5, CERVICAL 5-6, CERVICAL 6-7 WITH INSTRUMENTATION AND ALLOGRAFT;  Surgeon: Phylliss Bob, MD;  Location: [place];  Service: Orthopedics;  Laterality: N/A;  . APPENDECTOMY    . BILATERAL SALPINGECTOMY Bilateral 04/16/2013   Procedure: BILATERAL SALPINGECTOMY;  Surgeon: Lavonia Drafts, MD;  Location: [facility];  Service: Gynecology;  Laterality: Bilateral;  . [name], 2002  . DILATION AND CURETTAGE OF UTERUS N/A 03/31/2012   Procedure: DILATATION AND CURETTAGE;  Surgeon: Emily Filbert, MD;  Location: [facility];  Service: Gynecology;  Laterality: N/A;  . LAPAROSCOPIC LYSIS OF ADHESIONS N/A 03/31/2012   Procedure: LAPAROSCOPIC LYSIS OF ADHESIONS;  Surgeon: Emily Filbert, MD;  Location: [facility];  Service: Gynecology;  Laterality: N/A;  . LAPAROSCOPY N/A 03/31/2012   Procedure: LAPAROSCOPY OPERATIVE;  Surgeon: Emily Filbert, MD;  Location: [facility];  Service:  Gynecology;  Laterality: N/A;  . NOVASURE ABLATION N/A 03/31/2012   Procedure: NOVASURE ABLATION;  Surgeon: Emily Filbert, MD;  Location: [facility];  Service: Gynecology;  Laterality: N/A;  . ROBOTIC ASSISTED TOTAL HYSTERECTOMY N/A 04/16/2013   Procedure: ROBOTIC ASSISTED TOTAL HYSTERECTOMY;  Surgeon: Lavonia Drafts, MD;  Location: [facility];  Service: Gynecology;  Laterality: N/A;  . TUBAL LIGATION  2002  . WISDOM TOOTH EXTRACTION      There were no vitals filed for this visit.   Subjective Assessment - 11/05/19 1106    Subjective Patien teports she is still stiff but her pain is much more controlled. She has been working on her stretches    Pertinent History Hysterectomy, 2 cesareans, appendectomy    Limitations Sitting;Lifting;House hold activities    Patient Stated Goals Be able to take control of the pain and continue to heal to 100%    Currently in Pain? No/denies   just stiffness   Pain Onset More than a month ago    Pain Frequency Constant              [facility] PT Assessment - 11/05/19 0001      AROM   Cervical - Right Rotation 67    Cervical - Left Rotation 58      Strength   Right Shoulder Flexion 4+/5    Right Shoulder Internal Rotation 4+/5    Right Shoulder External Rotation 4+/5    Left Shoulder Flexion 4/5  Left Shoulder Extension 4/5    Left Shoulder External Rotation 4+/5      Palpation   Palpation comment continued toightness in the upper traps                          [facility] Adult PT Treatment/Exercise - 11/05/19 0001      Neck Exercises: Standing   Other Standing Exercises scap retraction x20 green; shoulder extnesion green x20     Other Standing Exercises standing shoulder flexion 2x10; standing scaption 210 1lb each       Neck Exercises: Seated   Other Seated Exercise bilateral ER green 2x10; horizontal abduction 2x10; band flexion green 2x10      Manual Therapy   Manual Therapy Soft tissue mobilization;Myofascial release;Passive  ROM;Manual Traction    Soft tissue mobilization Left upper trap/levator scap and bilateral cervical paraspinals using IASTYM     Myofascial Release Suboccipital release    Passive ROM Upper trap and levator scap stretching    Manual Traction With suboccipital release      Neck Exercises: Stretches   Upper Trapezius Stretch 20 seconds;2 reps    Levator Stretch 20 seconds;2 reps                    PT Short Term Goals - 11/05/19 1228      PT SHORT TERM GOAL #1   Title Pt will be independent and compliant with initial HEP.    Baseline independent with HEP    Time 1    Period Weeks    Status Achieved    Target Date 12/03/19      PT SHORT TERM GOAL #2   Title Pt will demonstrates pain free B cervical rotation to >/= 65 degrees.    Baseline right met left 57    Time 4    Period Weeks    Status Partially Met    Target Date 12/03/19      PT SHORT TERM GOAL #3   Title Pt will report <5/10 pain at worst with work activities.    Baseline still can reach a 5/10 with use of the left arm    Time 4    Period Weeks    Status On-going    Target Date 12/03/19             PT Long Term Goals - 11/05/19 1229      PT LONG TERM GOAL #1   Title Pt will increase B shoulder MMT to 4+/5 without pain or compensation, as needed for job duties.    Baseline right gross 4+/5 left ER and flexion still 4/5    Time 8    Period Weeks    Status Partially Met    Target Date 12/17/19      PT LONG TERM GOAL #2   Title Pt will demonstrate pain free cervical AROM WFL and with good postural alignment.    Baseline progressing towards goal but still limited to the left    Time 6    Period Weeks    Status On-going    Target Date 12/17/19      PT LONG TERM GOAL #3   Title Pt will have no occurrence of headaches/ability to prevent onset of HA with HEP performance.    Baseline imtemittent but still progressing    Time 6    Period Weeks    Status On-going    Target Date 12/17/19  PT  LONG TERM GOAL #4   Title Pt will return to work full time with no c/o limitation from pain.    Baseline continued limitations    Status On-going                 Plan - 11/05/19 1225    Clinical Impression Statement The patient is making progress. Her cerical rotation to the left was measured at 57 degrees. To the right it was measured at 67 degrees. Her pain has reduced signifcnatly.  She continues to have intermittent headcaheds but they are much less frequent. Her shoulder strength ahs improved but its still limited. She is trending in the right direction but would benefit form further skilled therapy for continued TPDN, to improve muscle strength and motion.    Personal Factors and Comorbidities Age;Time since onset of injury/illness/exacerbation;Past/Current Experience;Profession    Comorbidities Anemia    Examination-Activity Limitations Sit;Sleep;Carry;Lift    Examination-Participation Restrictions Driving;Community Activity;Shop;Cleaning;Laundry    Stability/Clinical Decision Making Stable/Uncomplicated    Clinical Decision Making Low    Rehab Potential Good    PT Frequency 1x / week    PT Duration 8 weeks    PT Treatment/Interventions Spinal Manipulations;Joint Manipulations;Taping;Dry needling;Passive range of motion;Manual techniques;Patient/family education;Therapeutic exercise;Neuromuscular re-education;Therapeutic activities;Functional mobility training;Traction;Moist Heat;Iontophoresis 4mg /ml Dexamethasone;ADLs/Self Care Home Management;Cryotherapy;Electrical Stimulation    PT Next Visit Plan Assess HEP and progress PRN, dry needling and manual for left upper trap/levator/suboccipitals, postural strengthening    PT Home Exercise Plan [id number]    Consulted and Agree with Plan of Care Patient           Patient will benefit from skilled therapeutic intervention in order to improve the following deficits and impairments:  Pain, Postural dysfunction, Impaired flexibility,  Increased fascial restricitons, Decreased strength, Decreased activity tolerance, Improper body mechanics, Impaired perceived functional ability, Hypomobility  Visit Diagnosis: Fusion of spine, cervical region  Abnormal posture  Muscle weakness (generalized)  .Check all possible CPT codes:      []  97110 (Therapeutic Exercise)  []  92507 (SLP Treatment)  []  97112 (Neuro Re-ed)   []  92526 (Swallowing Treatment)   []  97116 (Gait Training)   []  [phone] (Cognitive Training, 1st 15 minutes) []  97140 (Manual Therapy)   []  97130 (Cognitive Training, each add'l 15 minutes)  []  97530 (Therapeutic Activities)  []  Other, List CPT Code ____________    []  62703 (Self Care)       [x]  All codes above (97110 - 97535)  []  97012 (Mechanical Traction)  []  97014 (E-stim Unattended)  []  97032 (E-stim manual)  []  50093 (Ionto)  []  97035 (Ultrasound)  []  97016 (Vaso)  []  97760 (Orthotic Fit) []  81829 (Prosthetic Training) []  [id number] (Physical Performance Training) []  [id number] (Aquatic Therapy) []  [id number] (Canalith Repositioning) []  [id number] (Contrast Bath) []  [id number] (Paraffin) []  97597 (Wound Care 1st 20 sq cm) []  97598 (Wound Care each add'l 20 sq cm)      Problem List Patient Active Problem List   Diagnosis Date Noted  . Radiculopathy 04/26/2019  . Cervical myelopathy ([place]) 03/07/2019  . Weakness 03/01/2019  . Headache 02/19/2019  . Shoulder pain, left 12/07/2018  . Iron deficiency anemia 11/07/2018  . Elevated blood pressure reading 11/07/2018  . Preventative health care 11/07/2018  . Postoperative state 04/16/2013  . Hematometra 01/09/2013  . Menorrhagia 01/09/2013  . Chronic pelvic pain in female 01/09/2013    Carney Living PT DPT  11/05/2019, 12:32 PM  [place],  [place], [address] Phone: [phone]   Fax:  [phone]  Name: TELISA OHLSEN MRN: [id number] Date of Birth: [birth year]

## 2019-11-13 ENCOUNTER — Ambulatory Visit: Payer: Medicaid Other | Admitting: Physical Therapy

## 2019-11-13 ENCOUNTER — Encounter: Payer: Self-pay | Admitting: Physical Therapy

## 2019-11-13 ENCOUNTER — Other Ambulatory Visit: Payer: Self-pay

## 2019-11-13 DIAGNOSIS — M4322 Fusion of spine, cervical region: Secondary | ICD-10-CM | POA: Diagnosis not present

## 2019-11-13 DIAGNOSIS — M6281 Muscle weakness (generalized): Secondary | ICD-10-CM

## 2019-11-13 DIAGNOSIS — R293 Abnormal posture: Secondary | ICD-10-CM

## 2019-11-13 NOTE — Therapy (Signed)
[place], [place], [address] Phone: [phone]   Fax:  [phone]  Physical Therapy Treatment  Patient Details  Name: [name] MRN: [id number] Date of Birth: [birth year] Referring Provider (PT): Phylliss Bob, MD   Encounter Date: 11/13/2019   PT End of Session - 11/13/19 1508    Visit Number 10    Number of Visits 15    Date for PT Re-Evaluation 12/17/19    Authorization - Visit Number 10    Authorization - Number of Visits 27    PT Start Time 1500    PT Stop Time 9758    PT Time Calculation (min) 41 min    Activity Tolerance Patient tolerated treatment well    Behavior During Therapy [facility] for tasks assessed/performed           Past Medical History:  Diagnosis Date  . Anemia   . Fibroids, submucosal   . History of blood transfusion 02/2011   [facility] - ? 2-3 units transfused    Past Surgical History:  Procedure Laterality Date  . ANTERIOR CERVICAL DECOMPRESSION/DISCECTOMY FUSION 4 LEVELS N/A 04/26/2019   Procedure: ANTERIOR CERVICAL DECOMPRESSION FUSION CERVICAL 4-5, CERVICAL 5-6, CERVICAL 6-7 WITH INSTRUMENTATION AND ALLOGRAFT;  Surgeon: Phylliss Bob, MD;  Location: [facility];  Service: Orthopedics;  Laterality: N/A;  . APPENDECTOMY    . BILATERAL SALPINGECTOMY Bilateral 04/16/2013   Procedure: BILATERAL SALPINGECTOMY;  Surgeon: Lavonia Drafts, MD;  Location: [facility];  Service: Gynecology;  Laterality: Bilateral;  . [place], 2002  . DILATION AND CURETTAGE OF UTERUS N/A 03/31/2012   Procedure: DILATATION AND CURETTAGE;  Surgeon: Emily Filbert, MD;  Location: [facility];  Service: Gynecology;  Laterality: N/A;  . LAPAROSCOPIC LYSIS OF ADHESIONS N/A 03/31/2012   Procedure: LAPAROSCOPIC LYSIS OF ADHESIONS;  Surgeon: Emily Filbert, MD;  Location: [facility];  Service: Gynecology;  Laterality: N/A;  . LAPAROSCOPY N/A 03/31/2012   Procedure: LAPAROSCOPY OPERATIVE;  Surgeon: Emily Filbert, MD;   Location: [facility];  Service: Gynecology;  Laterality: N/A;  . NOVASURE ABLATION N/A 03/31/2012   Procedure: NOVASURE ABLATION;  Surgeon: Emily Filbert, MD;  Location: [facility];  Service: Gynecology;  Laterality: N/A;  . ROBOTIC ASSISTED TOTAL HYSTERECTOMY N/A 04/16/2013   Procedure: ROBOTIC ASSISTED TOTAL HYSTERECTOMY;  Surgeon: Lavonia Drafts, MD;  Location: [facility];  Service: Gynecology;  Laterality: N/A;  . TUBAL LIGATION  2002  . WISDOM TOOTH EXTRACTION      There were no vitals filed for this visit.   Subjective Assessment - 11/13/19 1505    Subjective Patients C/O today is pain in her right knee. She feels like it is swollen. She is having pain with ambualtion. Her neck is doing well.    Pertinent History Hysterectomy, 2 cesareans, appendectomy    Limitations Sitting;Lifting;House hold activities    Currently in Pain? No/denies                             [facility] Adult PT Treatment/Exercise - 11/13/19 0001      Neck Exercises: Standing   Other Standing Exercises scap retraction x20 green; shoulder extnesion green x20       Neck Exercises: Seated   Other Seated Exercise bilateral ER green 2x10; horizontal abduction 2x10; band flexion green 2x10      Manual Therapy   Manual Therapy Soft tissue mobilization;Myofascial release;Passive ROM;Manual Traction    Soft  tissue mobilization Left upper trap/levator scap and bilateral cervical paraspinals using IASTYM     Myofascial Release Suboccipital release    Passive ROM Upper trap and levator scap stretching    Manual Traction With suboccipital release                  PT Education - 11/13/19 1506    Education Details reviewed strengthening.    Person(s) Educated Patient    Methods Explanation;Demonstration;Tactile cues;Verbal cues    Comprehension Returned demonstration;Verbal cues required;Tactile cues required;Verbalized understanding            PT Short Term Goals - 11/05/19 1228      PT SHORT  TERM GOAL #1   Title Pt will be independent and compliant with initial HEP.    Baseline independent with HEP    Time 1    Period Weeks    Status Achieved    Target Date 12/03/19      PT SHORT TERM GOAL #2   Title Pt will demonstrates pain free B cervical rotation to >/= 65 degrees.    Baseline right met left 57    Time 4    Period Weeks    Status Partially Met    Target Date 12/03/19      PT SHORT TERM GOAL #3   Title Pt will report <5/10 pain at worst with work activities.    Baseline still can reach a 5/10 with use of the left arm    Time 4    Period Weeks    Status On-going    Target Date 12/03/19             PT Long Term Goals - 11/05/19 1229      PT LONG TERM GOAL #1   Title Pt will increase B shoulder MMT to 4+/5 without pain or compensation, as needed for job duties.    Baseline right gross 4+/5 left ER and flexion still 4/5    Time 8    Period Weeks    Status Partially Met    Target Date 12/17/19      PT LONG TERM GOAL #2   Title Pt will demonstrate pain free cervical AROM WFL and with good postural alignment.    Baseline progressing towards goal but still limited to the left    Time 6    Period Weeks    Status On-going    Target Date 12/17/19      PT LONG TERM GOAL #3   Title Pt will have no occurrence of headaches/ability to prevent onset of HA with HEP performance.    Baseline imtemittent but still progressing    Time 6    Period Weeks    Status On-going    Target Date 12/17/19      PT LONG TERM GOAL #4   Title Pt will return to work full time with no c/o limitation from pain.    Baseline continued limitations    Status On-going                 Plan - 11/13/19 1604    Clinical Impression Statement The patients neck continues to progress. she hada a great witch respose with needling. She reports the tightness that she had prior to therapy was gone. She tolerated ther-ex well. We will continue to progress ther-ex as tolerated.     Personal Factors and Comorbidities Age;Time since onset of injury/illness/exacerbation;Past/Current Experience;Profession    Comorbidities Anemia    Examination-Activity  Limitations Sit;Sleep;Carry;Lift    Examination-Participation Restrictions Driving;Community Activity;Shop;Cleaning;Laundry    Stability/Clinical Decision Making Stable/Uncomplicated    Clinical Decision Making Low    Rehab Potential Good    PT Frequency 1x / week    PT Duration 8 weeks    PT Treatment/Interventions Spinal Manipulations;Joint Manipulations;Taping;Dry needling;Passive range of motion;Manual techniques;Patient/family education;Therapeutic exercise;Neuromuscular re-education;Therapeutic activities;Functional mobility training;Traction;Moist Heat;Iontophoresis 2m/ml Dexamethasone;ADLs/Self Care Home Management;Cryotherapy;Electrical Stimulation    PT Next Visit Plan Assess HEP and progress PRN, dry needling and manual for left upper trap/levator/suboccipitals, postural strengthening    PT Home Exercise Plan [id number]    Consulted and Agree with Plan of Care Patient           Patient will benefit from skilled therapeutic intervention in order to improve the following deficits and impairments:  Pain, Postural dysfunction, Impaired flexibility, Increased fascial restricitons, Decreased strength, Decreased activity tolerance, Improper body mechanics, Impaired perceived functional ability, Hypomobility  Visit Diagnosis: Fusion of spine, cervical region  Abnormal posture  Muscle weakness (generalized)     Problem List Patient Active Problem List   Diagnosis Date Noted  . Radiculopathy 04/26/2019  . [place] 03/07/2019  . Weakness 03/01/2019  . Headache 02/19/2019  . Shoulder pain, left 12/07/2018  . Iron deficiency anemia 11/07/2018  . Elevated blood pressure reading 11/07/2018  . Preventative health care 11/07/2018  . Postoperative state 04/16/2013  . Hematometra 01/09/2013  .  Menorrhagia 01/09/2013  . Chronic pelvic pain in female 01/09/2013    DCarney LivingPT DPT  11/13/2019, 4:08 PM   LDawayne CirriSPT   During this treatment session, the therapist was present, participating in and directing the treatment.   [place] [address]Phone: [phone]  Fax:  [phone] Name: Kimberly CHARTERSMRN: [id number] of Birth: 1Jul 01, 1972

## 2019-11-19 ENCOUNTER — Ambulatory Visit: Payer: Medicaid Other | Admitting: Physical Therapy

## 2019-11-27 ENCOUNTER — Ambulatory Visit: Payer: Medicaid Other | Attending: Orthopedic Surgery | Admitting: Physical Therapy

## 2019-11-27 ENCOUNTER — Other Ambulatory Visit: Payer: Self-pay

## 2019-11-27 DIAGNOSIS — M6281 Muscle weakness (generalized): Secondary | ICD-10-CM

## 2019-11-27 DIAGNOSIS — M4322 Fusion of spine, cervical region: Secondary | ICD-10-CM

## 2019-11-27 DIAGNOSIS — R293 Abnormal posture: Secondary | ICD-10-CM | POA: Diagnosis present

## 2019-11-28 ENCOUNTER — Encounter: Payer: Self-pay | Admitting: Physical Therapy

## 2019-11-28 NOTE — Therapy (Signed)
[place], [place], [address] Phone: [phone]   Fax:  [phone]  Physical Therapy Treatment  Patient Details  Name: [name] MRN: [id number] Date of Birth: [birth year] Referring Provider (PT): Phylliss Bob, MD   Encounter Date: 11/27/2019   PT End of Session - 11/28/19 1308    Visit Number 11    Number of Visits 15    Date for PT Re-Evaluation 12/17/19    Authorization Type M[employer]    Authorization - Visit Number 11    Authorization - Number of Visits 27    PT Start Time 7867    PT Stop Time 1456    PT Time Calculation (min) 41 min    Activity Tolerance Patient tolerated treatment well    Behavior During Therapy [facility] for tasks assessed/performed           Past Medical History:  Diagnosis Date  . Anemia   . Fibroids, submucosal   . History of blood transfusion 02/2011   [facility] - ? 2-3 units transfused    Past Surgical History:  Procedure Laterality Date  . ANTERIOR CERVICAL DECOMPRESSION/DISCECTOMY FUSION 4 LEVELS N/A 04/26/2019   Procedure: ANTERIOR CERVICAL DECOMPRESSION FUSION CERVICAL 4-5, CERVICAL 5-6, CERVICAL 6-7 WITH INSTRUMENTATION AND ALLOGRAFT;  Surgeon: Phylliss Bob, MD;  Location: [place];  Service: Orthopedics;  Laterality: N/A;  . APPENDECTOMY    . BILATERAL SALPINGECTOMY Bilateral 04/16/2013   Procedure: BILATERAL SALPINGECTOMY;  Surgeon: Lavonia Drafts, MD;  Location: [place] [facility];  Service: Gynecology;  Laterality: Bilateral;  . [place], 2002  . DILATION AND CURETTAGE OF UTERUS N/A 03/31/2012   Procedure: DILATATION AND CURETTAGE;  Surgeon: Emily Filbert, MD;  Location: [place] [facility];  Service: Gynecology;  Laterality: N/A;  . LAPAROSCOPIC LYSIS OF ADHESIONS N/A 03/31/2012   Procedure: LAPAROSCOPIC LYSIS OF ADHESIONS;  Surgeon: Emily Filbert, MD;  Location: [place] [facility];  Service: Gynecology;  Laterality: N/A;  . LAPAROSCOPY N/A 03/31/2012   Procedure: LAPAROSCOPY  OPERATIVE;  Surgeon: Emily Filbert, MD;  Location: [place] [facility];  Service: Gynecology;  Laterality: N/A;  . NOVASURE ABLATION N/A 03/31/2012   Procedure: NOVASURE ABLATION;  Surgeon: Emily Filbert, MD;  Location: [place] [facility];  Service: Gynecology;  Laterality: N/A;  . ROBOTIC ASSISTED TOTAL HYSTERECTOMY N/A 04/16/2013   Procedure: ROBOTIC ASSISTED TOTAL HYSTERECTOMY;  Surgeon: Lavonia Drafts, MD;  Location: [facility];  Service: Gynecology;  Laterality: N/A;  . TUBAL LIGATION  2002  . WISDOM TOOTH EXTRACTION      There were no vitals filed for this visit.   Subjective Assessment - 11/28/19 1306    Subjective Patient reports her neck is a little stiff but overall doing well. She feels like it gets stiff when she is doing too much activity.    Pertinent History Hysterectomy, 2 cesareans, appendectomy    Limitations Sitting;Lifting;House hold activities    Patient Stated Goals Be able to take control of the pain and continue to heal to 100%    Currently in Pain? No/denies                             [facility] Adult PT Treatment/Exercise - 11/28/19 0001      Neck Exercises: Standing   Other Standing Exercises scap retraction x20 green; shoulder extnesion green x20     Other Standing Exercises standing shoulder flexion 2x10; standing scaption 210 1lb each  Neck Exercises: Seated   Other Seated Exercise bilateral ER green 2x10; horizontal abduction 2x10; band flexion green 2x10    Other Seated Exercise UBE 3 min posterior       Manual Therapy   Manual Therapy Soft tissue mobilization;Myofascial release;Passive ROM;Manual Traction    Soft tissue mobilization Left upper trap/levator scap and bilateral cervical paraspinals using IASTYM     Myofascial Release Suboccipital release    Passive ROM Upper trap and levator scap stretching    Manual Traction With suboccipital release            Trigger Point Dry Needling - 11/28/19 0001    Consent Given? Yes    Other Dry Needling 2  spots in upper trap and 2 in cervical spine     Upper Trapezius Response Twitch reponse elicited;Palpable increased muscle length    Cervical multifidi Response Palpable increased muscle length;Twitch reponse elicited                PT Education - 11/28/19 1307    Education Details reviewed POC going forward    Person(s) Educated Patient    Methods Explanation;Demonstration;Tactile cues;Verbal cues    Comprehension Verbalized understanding;Returned demonstration;Verbal cues required;Tactile cues required            PT Short Term Goals - 11/05/19 1228      PT SHORT TERM GOAL #1   Title Pt will be independent and compliant with initial HEP.    Baseline independent with HEP    Time 1    Period Weeks    Status Achieved    Target Date 12/03/19      PT SHORT TERM GOAL #2   Title Pt will demonstrates pain free B cervical rotation to >/= 65 degrees.    Baseline right met left 57    Time 4    Period Weeks    Status Partially Met    Target Date 12/03/19      PT SHORT TERM GOAL #3   Title Pt will report <5/10 pain at worst with work activities.    Baseline still can reach a 5/10 with use of the left arm    Time 4    Period Weeks    Status On-going    Target Date 12/03/19             PT Long Term Goals - 11/05/19 1229      PT LONG TERM GOAL #1   Title Pt will increase B shoulder MMT to 4+/5 without pain or compensation, as needed for job duties.    Baseline right gross 4+/5 left ER and flexion still 4/5    Time 8    Period Weeks    Status Partially Met    Target Date 12/17/19      PT LONG TERM GOAL #2   Title Pt will demonstrate pain free cervical AROM WFL and with good postural alignment.    Baseline progressing towards goal but still limited to the left    Time 6    Period Weeks    Status On-going    Target Date 12/17/19      PT LONG TERM GOAL #3   Title Pt will have no occurrence of headaches/ability to prevent onset of HA with HEP performance.     Baseline imtemittent but still progressing    Time 6    Period Weeks    Status On-going    Target Date 12/17/19      PT LONG  TERM GOAL #4   Title Pt will return to work full time with no c/o limitation from pain.    Baseline continued limitations    Status On-going                 Plan - 11/28/19 1309    Clinical Impression Statement Therapy needled her paraspinals today. on the left adie and upper trap. She had very little twitch in the upper trap. She tolerated ther-ex well. Therapy will review complete program next visit with likley D/C to HEP.    Personal Factors and Comorbidities Age;Time since onset of injury/illness/exacerbation;Past/Current Experience;Profession    Comorbidities Anemia    Examination-Activity Limitations Sit;Sleep;Carry;Lift    Examination-Participation Restrictions Driving;Community Activity;Shop;Cleaning;Laundry    Stability/Clinical Decision Making Stable/Uncomplicated    Clinical Decision Making Low    Rehab Potential Good    PT Frequency 1x / week    PT Duration 8 weeks    PT Treatment/Interventions Spinal Manipulations;Joint Manipulations;Taping;Dry needling;Passive range of motion;Manual techniques;Patient/family education;Therapeutic exercise;Neuromuscular re-education;Therapeutic activities;Functional mobility training;Traction;Moist Heat;Iontophoresis 52m/ml Dexamethasone;ADLs/Self Care Home Management;Cryotherapy;Electrical Stimulation    PT Next Visit Plan Assess HEP and progress PRN, dry needling and manual for left upper trap/levator/suboccipitals, postural strengthening    PT Home Exercise Plan [id number]    Consulted and Agree with Plan of Care Patient           Patient will benefit from skilled therapeutic intervention in order to improve the following deficits and impairments:  Pain, Postural dysfunction, Impaired flexibility, Increased fascial restricitons, Decreased strength, Decreased activity tolerance, Improper body mechanics,  Impaired perceived functional ability, Hypomobility  Visit Diagnosis: Fusion of spine, cervical region  Abnormal posture  Muscle weakness (generalized)     Problem List Patient Active Problem List   Diagnosis Date Noted  . Radiculopathy 04/26/2019  . [place] 03/07/2019  . Weakness 03/01/2019  . Headache 02/19/2019  . Shoulder pain, left 12/07/2018  . Iron deficiency anemia 11/07/2018  . Elevated blood pressure reading 11/07/2018  . Preventative health care 11/07/2018  . Postoperative state 04/16/2013  . Hematometra 01/09/2013  . Menorrhagia 01/09/2013  . Chronic pelvic pain in female 01/09/2013    DCarney LivingPT DPT  11/28/2019, 1:12 PM  [place] [address]Phone: [phone]  Fax:  [phone] Name: Kimberly RISTERMRN: [id number] of Birth: 104-18-1972

## 2019-12-03 ENCOUNTER — Ambulatory Visit: Payer: Medicaid Other | Admitting: Physical Therapy

## 2019-12-03 ENCOUNTER — Encounter: Payer: Self-pay | Admitting: Physical Therapy

## 2019-12-03 ENCOUNTER — Other Ambulatory Visit: Payer: Self-pay

## 2019-12-03 DIAGNOSIS — M6281 Muscle weakness (generalized): Secondary | ICD-10-CM

## 2019-12-03 DIAGNOSIS — R293 Abnormal posture: Secondary | ICD-10-CM

## 2019-12-03 DIAGNOSIS — M4322 Fusion of spine, cervical region: Secondary | ICD-10-CM | POA: Diagnosis not present

## 2019-12-03 NOTE — Therapy (Signed)
[facility] [address] , [place], [address] Phone: [phone]   Fax:  [phone]  Physical Therapy Treatment  Patient Details  Name: [name] MRN: [id number] Date of Birth: 02/22/1970 Referring Provider (PT): Mark Dumonski, MD    Encounter Date: 12/03/2019   PT End of Session - 12/03/19 1109    Visit Number 12    Number of Visits 15    Date for PT Re-Evaluation 12/17/19    Authorization Type MCD UHC    PT Start Time 1100    PT Stop Time 1140    PT Time Calculation (min) 40 min    Activity Tolerance Patient tolerated treatment well    Behavior During Therapy [facility]FL for tasks assessed/performed           Past Medical History:  Diagnosis Date  . Anemia   . Fibroids, submucosal   . History of blood transfusion 02/2011   [facility] - ? 2-3 units transfused    Past Surgical History:  Procedure Laterality Date  . ANTERIOR CERVICAL DECOMPRESSION/DISCECTOMY FUSION 4 LEVELS N/A 04/26/2019   Procedure: ANTERIOR CERVICAL DECOMPRESSION FUSION CERVICAL 4-5, CERVICAL 5-6, CERVICAL 6-7 WITH INSTRUMENTATION AND ALLOGRAFT;  Surgeon: Dumonski, Mark, MD;  Location: [facility];  Service: Orthopedics;  Laterality: N/A;  . APPENDECTOMY    . BILATERAL SALPINGECTOMY Bilateral 04/16/2013   Procedure: BILATERAL SALPINGECTOMY;  Surgeon: Carolyn Harraway-Smith, MD;  Location: [facility];  Service: Gynecology;  Laterality: Bilateral;  . CESAREAN SECTION  1994, 2002  . DILATION AND CURETTAGE OF UTERUS N/A 03/31/2012   Procedure: DILATATION AND CURETTAGE;  Surgeon: Myra C Dove, MD;  Location: [facility];  Service: Gynecology;  Laterality: N/A;  . LAPAROSCOPIC LYSIS OF ADHESIONS N/A 03/31/2012   Procedure: LAPAROSCOPIC LYSIS OF ADHESIONS;  Surgeon: Myra C Dove, MD;  Location: [facility];  Service: Gynecology;  Laterality: N/A;  . LAPAROSCOPY N/A 03/31/2012   Procedure: LAPAROSCOPY OPERATIVE;  Surgeon: Myra C Dove, MD;  Location: [facility];  Service: Gynecology;   Laterality: N/A;  . NOVASURE ABLATION N/A 03/31/2012   Procedure: NOVASURE ABLATION;  Surgeon: Myra C Dove, MD;  Location: [facility];  Service: Gynecology;  Laterality: N/A;  . ROBOTIC ASSISTED TOTAL HYSTERECTOMY N/A 04/16/2013   Procedure: ROBOTIC ASSISTED TOTAL HYSTERECTOMY;  Surgeon: Carolyn Harraway-Smith, MD;  Location: [facility];  Service: Gynecology;  Laterality: N/A;  . TUBAL LIGATION  2002  . WISDOM TOOTH EXTRACTION      There were no vitals filed for this visit.   Subjective Assessment - 12/03/19 1107    Subjective Patient continues to report a little stiffness this morning. She has been out running errands.    Pertinent History Hysterectomy, 2 cesareans, appendectomy    Patient Stated Goals Be able to take control of the pain and continue to heal to 100%    Currently in Pain? No/denies              [facility] PT Assessment - 12/03/19 0001      AROM   Cervical Flexion 43    Cervical Extension 50    Cervical - Right Rotation 73    Cervical - Left Rotation 64      Strength   Right Shoulder Flexion 5/5    Right Shoulder Internal Rotation 5/5    Right Shoulder External Rotation 5/5    Left Shoulder Flexion 5/5    Left Shoulder Extension 5/5    Left Shoulder External Rotation 5/5                           [facility] Adult PT Treatment/Exercise - 12/03/19 0001      Self-Care   Other Self-Care Comments  reviewed how to use HEP. Reviewed how to progress activity at home.       Neck Exercises: Standing   Other Standing Exercises scap retraction x20 green; shoulder extnesion green x20     Other Standing Exercises standing shoulder flexion 2x10; standing scaption 210 1lb each       Neck Exercises: Seated   Other Seated Exercise bilateral ER green 2x10; horizontal abduction 2x10; band flexion green 2x10    Other Seated Exercise UBE 3 min posterior       Manual Therapy   Manual Therapy Soft tissue mobilization;Myofascial release;Passive ROM;Manual Traction    Soft tissue  mobilization Left upper trap/levator scap and bilateral cervical paraspinals using IASTYM     Myofascial Release Suboccipital release    Passive ROM Upper trap and levator scap stretching    Manual Traction With suboccipital release                  PT Education - 12/03/19 1108    Education Details HEP and symptom mangement    Person(s) Educated Patient    Methods Explanation;Tactile cues;Verbal cues;Demonstration    Comprehension Verbalized understanding;Returned demonstration;Verbal cues required;Tactile cues required            PT Short Term Goals - 12/03/19 1236      PT SHORT TERM GOAL #1   Title Pt will be independent and compliant with initial HEP.    Baseline independent with HEP    Time 1    Period Weeks    Status Achieved    Target Date 12/03/19      PT SHORT TERM GOAL #2   Title Pt will demonstrates pain free B cervical rotation to >/= 65 degrees.    Baseline > 65 bilalteral    Time 4    Period Weeks    Target Date 12/03/19      PT SHORT TERM GOAL #3   Title Pt will report <5/10 pain at worst with work activities.    Baseline still can reach a 5/10 with use of the left arm    Time 4    Period Weeks    Status Achieved    Target Date 12/03/19             PT Long Term Goals - 11/05/19 1229      PT LONG TERM GOAL #1   Title Pt will increase B shoulder MMT to 4+/5 without pain or compensation, as needed for job duties.    Baseline right gross 4+/5 left ER and flexion still 4/5    Time 8    Period Weeks    Status Partially Met    Target Date 12/17/19      PT LONG TERM GOAL #2   Title Pt will demonstrate pain free cervical AROM WFL and with good postural alignment.    Baseline progressing towards goal but still limited to the left    Time 6    Period Weeks    Status On-going    Target Date 12/17/19      PT LONG TERM GOAL #3   Title Pt will have no occurrence of headaches/ability to prevent onset of HA with HEP performance.    Baseline  imtemittent but still progressing    Time 6    Period Weeks    Status On-going    Target Date 12/17/19        PT LONG TERM GOAL #4   Title Pt will return to work full time with no c/o limitation from pain.    Baseline continued limitations    Status On-going                 Plan - 12/03/19 1226    Clinical Impression Statement Patient has reached max potental for therapy at this time. She has had significant improvements in cervical pain and motion throuout her treatment. She has been working on her exercises at home. Therpay reviewed how to use her HEP over time. She was given harder bands for home. She is mostly having trouble looking down at this point but it does not seem to be a musclualr problem. She feels like she ischoked when she does. Therapy reviewed complete HEP. D/C to HEP at this time.    Personal Factors and Comorbidities Age;Time since onset of injury/illness/exacerbation;Past/Current Experience;Profession    Comorbidities Anemia    Examination-Activity Limitations Sit;Sleep;Carry;Lift    Examination-Participation Restrictions Driving;Community Activity;Shop;Cleaning;Laundry    Stability/Clinical Decision Making Stable/Uncomplicated    Clinical Decision Making Low    Rehab Potential Good    PT Frequency 1x / week    PT Duration 8 weeks    PT Treatment/Interventions Spinal Manipulations;Joint Manipulations;Taping;Dry needling;Passive range of motion;Manual techniques;Patient/family education;Therapeutic exercise;Neuromuscular re-education;Therapeutic activities;Functional mobility training;Traction;Moist Heat;Iontophoresis 4mg/ml Dexamethasone;ADLs/Self Care Home Management;Cryotherapy;Electrical Stimulation    PT Next Visit Plan Assess HEP and progress PRN, dry needling and manual for left upper trap/levator/suboccipitals, postural strengthening    PT Home Exercise Plan [id number]    Consulted and Agree with Plan of Care Patient           Patient will benefit from  skilled therapeutic intervention in order to improve the following deficits and impairments:  Pain, Postural dysfunction, Impaired flexibility, Increased fascial restricitons, Decreased strength, Decreased activity tolerance, Improper body mechanics, Impaired perceived functional ability, Hypomobility  Visit Diagnosis: Fusion of spine, cervical region  Abnormal posture  Muscle weakness (generalized)    PHYSICAL THERAPY DISCHARGE SUMMARY  Visits from Start of Care: 13  Current functional level related to goals / functional outcomes: Improved pain and motion    Remaining deficits: Mild difficulty looking down.    Education / Equipment: HEP   Plan: Patient agrees to discharge.  Patient goals were met. Patient is being discharged due to meeting the stated rehab goals.  ?????      Problem List Patient Active Problem List   Diagnosis Date Noted  . Radiculopathy 04/26/2019  . Cervical myelopathy (HCC) 03/07/2019  . Weakness 03/01/2019  . Headache 02/19/2019  . Shoulder pain, left 12/07/2018  . Iron deficiency anemia 11/07/2018  . Elevated blood pressure reading 11/07/2018  . Preventative health care 11/07/2018  . Postoperative state 04/16/2013  . Hematometra 01/09/2013  . Menorrhagia 01/09/2013  . Chronic pelvic pain in female 01/09/2013    David J Carroll PT DPT  12/03/2019, 12:37 PM  [facility] [address] [place], [place], [address] Phone: [phone]   Fax:  [phone]  Name: [name] MRN: [id number] Date of Birth: 09/17/1970   

## 2019-12-10 ENCOUNTER — Other Ambulatory Visit: Payer: Self-pay

## 2019-12-10 ENCOUNTER — Ambulatory Visit (INDEPENDENT_AMBULATORY_CARE_PROVIDER_SITE_OTHER): Payer: Medicaid Other | Admitting: Internal Medicine

## 2019-12-10 DIAGNOSIS — G43009 Migraine without aura, not intractable, without status migrainosus: Secondary | ICD-10-CM

## 2019-12-10 DIAGNOSIS — M542 Cervicalgia: Secondary | ICD-10-CM | POA: Diagnosis not present

## 2019-12-10 MED ORDER — SUMATRIPTAN SUCCINATE 50 MG PO TABS
50.0000 mg | ORAL_TABLET | Freq: Once | ORAL | 0 refills | Status: DC | PRN
Start: 2019-12-10 — End: 2019-12-31

## 2019-12-10 NOTE — Progress Notes (Signed)
  [facility] Internal Medicine Residency Telephone Encounter Continuity Care Appointment  HPI:   This telephone encounter was created for [name] on 12/10/2019 for the following purpose/cc headaches.  Patient reports history of headaches for the past year, occurs about 1-2 times per month.  She describes as headache as occurring over the temporal areas and behind her eyes, feels like it is a pounding sensation, states that she can feel it coming on.  States that it can last up to a day.  Associated with nausea, photophobia, phonophobia, and a feeling of heat around her head.  She occasionally will have headaches in the back of her head however states that now it is on the temporal area.  She denies any fevers, chills, vision changes, sudden onset, nocturnal headaches, weakness, numbness, tingling or other symptoms. She states that this is similar to prior headaches. She reports significant disturbance from these headaches, states that she has had to call in from work or leave work due to the headaches.  She will take generic OTC headache medications that contain caffeine which she reports helps. She also reports that dry needling helps. She does not have a headache right now.   Had headache and evaluated in ED on 07/08/19, neurological exam at that time was unremarkable. CT head negative for acute abnormalities. Felt to be 2/2 to migraine, advised to continue OTC pain medications and given return precautions.   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     Past Medical History:  Past Medical History:  Diagnosis Date  . Anemia   . Fibroids, submucosal   . History of blood transfusion 02/2011   [facility] - ? 2-3 units transfused      ROS:  Constitutional:  Negative for chills and fever.  Respiratory: Negative for shortness of breath.   Cardiovascular: Negative for chest pain and leg swelling.  Gastrointestinal: Negative for abdominal pain, nausea and vomiting.   Neurological: Negative for dizziness, weakness, numbness, tingling, vision change. Positive for headaches.    Assessment / Plan / Recommendations:   Please see A&P under problem oriented charting for assessment of the patient's acute and chronic medical conditions.   As always, pt is advised that if symptoms worsen or new symptoms arise, they should go to an urgent care facility or to to ER for further evaluation.   Consent and Medical Decision Making:   Patient discussed with Dr. Evette Doffing  This is a telephone encounter between [facility] and [name] on 12/10/2019 for headaches. The visit was conducted with the patient located at home and [name] at [facility]. The patient's identity was confirmed using their DOB and current address. The patient has consented to being evaluated through a telephone encounter and understands the associated risks (an examination cannot be done and the patient may need to come in for an appointment) / benefits (allows the patient to remain at home, decreasing exposure to coronavirus). I personally spent 10 minutes on medical discussion.

## 2019-12-11 NOTE — Progress Notes (Signed)
Internal Medicine Clinic Attending  Case discussed with Dr. Sherry Ruffing  At the time of the visit.  We reviewed the resident's history and pertinent patient test results.  I agree with the assessment, diagnosis, and plan of care documented in the resident's note.

## 2019-12-11 NOTE — Assessment & Plan Note (Addendum)
Patient reports history of headaches for the past year, occurs about 1-2 times per month.  She describes as headache as occurring over the temporal areas and behind her eyes, feels like it is a pounding sensation, states that she can feel it coming on.  States that it can last up to a day.  Associated with nausea, photophobia, phonophobia, and a feeling of heat around her head.  She occasionally will have headaches in the back of her head however states that now it is on the temporal area.  She denies any fevers, chills, vision changes, sudden onset, nocturnal headaches, weakness, numbness, tingling or other symptoms. She states that this is similar to prior headaches. She reports significant disturbance from these headaches, states that she has had to call in from work or leave work due to the headaches.  She will take generic OTC headache medications that contain caffeine which she reports helps. She also reports that dry needling helps. She does not have a headache right now. Had headache and evaluated in ED on 07/08/19, neurological exam at that time was unremarkable. CT head negative for acute abnormalities. Felt to be 2/2 to migraine, advised to continue OTC pain medications and given return precautions.  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 Unclear if current symptoms are related.   Symptoms appear to be consistent with a migraine headache.  No red flag symptoms.  Patient does not currently have a headache at this time.  Discussed treatment with abortive therapy which symptoms occur.  -Sumatriptan PRN -Advised to continue OTC headache medications

## 2019-12-11 NOTE — Addendum Note (Signed)
Addended by: Lalla Brothers T on: 12/11/2019 02:42 PM   Modules accepted: Level of Service

## 2019-12-19 ENCOUNTER — Ambulatory Visit (INDEPENDENT_AMBULATORY_CARE_PROVIDER_SITE_OTHER): Payer: Medicaid Other | Admitting: Internal Medicine

## 2019-12-19 VITALS — BP 142/78 | HR 78 | Temp 98.3°F | Ht 66.0 in | Wt 247.2 lb

## 2019-12-19 DIAGNOSIS — D649 Anemia, unspecified: Secondary | ICD-10-CM

## 2019-12-19 DIAGNOSIS — R200 Anesthesia of skin: Secondary | ICD-10-CM | POA: Diagnosis not present

## 2019-12-19 DIAGNOSIS — F419 Anxiety disorder, unspecified: Secondary | ICD-10-CM | POA: Insufficient documentation

## 2019-12-19 DIAGNOSIS — M791 Myalgia, unspecified site: Secondary | ICD-10-CM | POA: Insufficient documentation

## 2019-12-19 DIAGNOSIS — G43009 Migraine without aura, not intractable, without status migrainosus: Secondary | ICD-10-CM | POA: Diagnosis not present

## 2019-12-19 DIAGNOSIS — D509 Iron deficiency anemia, unspecified: Secondary | ICD-10-CM

## 2019-12-19 DIAGNOSIS — G4486 Cervicogenic headache: Secondary | ICD-10-CM

## 2019-12-19 DIAGNOSIS — R202 Paresthesia of skin: Secondary | ICD-10-CM

## 2019-12-19 NOTE — Progress Notes (Signed)
   CC: migraine  HPI:  [name] is a 49 y.o. with PMH as below.   Please see A&P for assessment of the patient's acute and chronic medical conditions.   Continues to have bilateral headaches with intermittent neck pain. When symptoms occur they last for days, no photophobia, positive for nausea and vomiting, currently 5/10. Sumatriptan seemed to worsen symptoms. Excedrin and dry needling have helped most. She will be going back to PT soon and will restart dry needling but would like to have something in the mean time. 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 Anemia   . Fibroids, submucosal   . History of blood transfusion 02/2011   [facility] - ? 2-3 units transfused   Review of Systems:   Review of Systems  Eyes: Negative for blurred vision, double vision, photophobia and pain.  Gastrointestinal: Positive for nausea and vomiting. Negative for abdominal pain, blood in stool, constipation, diarrhea and melena.  Musculoskeletal: Positive for myalgias and neck pain. Negative for falls and joint pain.  Neurological: Positive for tingling and headaches. Negative for dizziness, tremors and focal weakness.  Psychiatric/Behavioral: Negative for depression. The patient is nervous/anxious.      Physical Exam: Constitution: NAD, appears stated age HENT: [place]/AT, no scalp or cervical tenderness  Eyes: eom intact, PERRLA Cardio: RRR, no m/r/g, no LE edema  Respiratory: CTA, no w/r/r Abdominal: NTTP, soft, non-distended MSK: moving all extremities Neuro: a&o, anxious but pleasant affect Skin: c/d/i  Vitals:   12/19/19 1557  BP: (!) 142/78  Pulse: 78  Temp: 98.3 F (36.8 C)  TempSrc: Oral  SpO2: 100%  Weight: 247 lb 3.2 oz (112.1 kg)  Height: 5\' 6"  (1.676 m)    Assessment & Plan:   See Encounters Tab for problem based  charting.  Patient discussed with Dr. Dareen Piano

## 2019-12-19 NOTE — Assessment & Plan Note (Signed)
History of anemia, intermittent numbness and tingling   - ferritin, CBC - cont. Iron supplementation

## 2019-12-19 NOTE — Assessment & Plan Note (Addendum)
Continues to have bilateral headaches with intermittent neck pain. When symptoms occur they last for days, no photophobia, positive for nausea and vomiting, currently 5/10. Sumatriptan seemed to worsen symptoms. Excedrin and dry needling have helped most. She will be going back to PT soon and will restart dry needling but would like to have something in the mean time. 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 Symptoms most consistent with migraine but possibly cervicogenic as well.   - excedrin prn. Discussed not taking this chronically as she could develop rebound headaches which were thought to have caused symptoms in the past - start duloxetine for possible cervicogenic headaches in addition to anxiety and diffuse body aches - check cbc, ferritin with history of anemia, vitamin D - f/u if symptoms fail to improve prior to restarting dry needling - wears glasses intermittently, has blurring of vision without them, discussed consistently wearing glasses as this can worsen symptoms in addition to sleep hygeine

## 2019-12-19 NOTE — Patient Instructions (Signed)
Thank you for allowing [place] to provide your care today. Today we discussed your migraine headaches    I will call if any of your labs are abnormal.   Please take excedrin as need for headaches. Try to limit this medication as much as possible.    Please follow-up if symptoms worsen or fail to improve.    Should you have any questions or concerns please call the internal medicine clinic at [phone].

## 2019-12-19 NOTE — Assessment & Plan Note (Signed)
Recently worsened anxiety and difficulty sleeping. Sleeps with the tv on and has noticed that she has not been sleeping well recently since her children moved out to college.   - discussed importance of sleep hygiene, she has slept better in the past when she didn't leave the tv on  - melatonin prn  - start duloxetine 30 mg qd

## 2019-12-20 MED ORDER — DULOXETINE HCL 30 MG PO CPEP: 30.0000 mg | ORAL_CAPSULE | Freq: Every day | ORAL | 2 refills | Status: DC

## 2019-12-24 ENCOUNTER — Other Ambulatory Visit: Payer: Self-pay

## 2019-12-24 ENCOUNTER — Telehealth: Payer: Self-pay

## 2019-12-24 ENCOUNTER — Telehealth: Payer: Self-pay | Admitting: *Deleted

## 2019-12-24 ENCOUNTER — Ambulatory Visit (HOSPITAL_COMMUNITY)
Admission: EM | Admit: 2019-12-24 | Discharge: 2019-12-24 | Disposition: A | Discharge status: Home / Self Care | Payer: Medicaid Other | Attending: Internal Medicine | Admitting: Internal Medicine

## 2019-12-24 ENCOUNTER — Encounter: Payer: Self-pay | Admitting: Internal Medicine

## 2019-12-24 ENCOUNTER — Encounter (HOSPITAL_COMMUNITY): Payer: Self-pay

## 2019-12-24 DIAGNOSIS — R519 Headache, unspecified: Secondary | ICD-10-CM

## 2019-12-24 DIAGNOSIS — R5383 Other fatigue: Secondary | ICD-10-CM | POA: Diagnosis not present

## 2019-12-24 LAB — POCT URINALYSIS DIPSTICK, ED / UC
Bilirubin Urine: NEGATIVE
Glucose, UA: NEGATIVE mg/dL
Ketones, ur: NEGATIVE mg/dL
Leukocytes,Ua: NEGATIVE
Nitrite: NEGATIVE
Protein, ur: NEGATIVE mg/dL
Specific Gravity, Urine: 1.02 (ref 1.005–1.030)
Urobilinogen, UA: 0.2 mg/dL (ref 0.0–1.0)
pH: 5.5 (ref 5.0–8.0)

## 2019-12-24 LAB — CBG MONITORING, ED: Glucose-Capillary: 98 mg/dL (ref 70–99)

## 2019-12-24 LAB — VITAMIN D 25 HYDROXY (VIT D DEFICIENCY, FRACTURES): Vit D, 25-Hydroxy: 9.94 ng/mL — ABNORMAL LOW (ref 30–100)

## 2019-12-24 LAB — TSH: TSH: 9.905 u[IU]/mL — ABNORMAL HIGH (ref 0.350–4.500)

## 2019-12-24 NOTE — Telephone Encounter (Signed)
Letter is ready for pick-up. Called pt - requesting letter to mail to her.

## 2019-12-24 NOTE — Progress Notes (Signed)
Internal Medicine Clinic Attending  Case discussed with Dr. Seawell  At the time of the visit.  We reviewed the resident's history and exam and pertinent patient test results.  I agree with the assessment, diagnosis, and plan of care documented in the resident's note.  

## 2019-12-24 NOTE — Telephone Encounter (Signed)
Pt needs dr note [phone] pls contact

## 2019-12-24 NOTE — Telephone Encounter (Signed)
Called pt - she needs a note for her job stating she was here on 12/1.

## 2019-12-24 NOTE — Discharge Instructions (Signed)
Please continue migraine medications prescribed by your primary care physician Please sign up for my chart so you can see your lab results If your lab results are abnormal, we will call you with recommendations.

## 2019-12-24 NOTE — Telephone Encounter (Signed)
Pt calls and states she is voiding way too much, states she is increasing her po fluid intake but she has been 10 times today and that is not normal. She denies: Urgency Pain w/ voiding, abd pain, back pain, urethral pain, no burning, stinging No fevers She is offered an appt and states she would rather go to urgent care because she knows something is going on, appt offered was for tues 12/7, refused

## 2019-12-24 NOTE — ED Triage Notes (Signed)
Pt reports having increased urinary frequency and fatigue  since 1500 today. Denies fever, chills, nausea.

## 2019-12-24 NOTE — Telephone Encounter (Signed)
Agree with urgent care evaluation if declined appointment for tomorrow. Thank you.

## 2019-12-25 ENCOUNTER — Telehealth (HOSPITAL_COMMUNITY): Payer: Self-pay | Admitting: Emergency Medicine

## 2019-12-25 MED ORDER — VITAMIN D (ERGOCALCIFEROL) 1.25 MG (50000 UNIT) PO CAPS: 50000.0000 [IU] | ORAL_CAPSULE | ORAL | 0 refills | Status: DC

## 2019-12-25 NOTE — Telephone Encounter (Signed)
Please note the lab values that were from yesterday in urgent care

## 2019-12-25 NOTE — Telephone Encounter (Signed)
Thanks for update. Can you follow-up with her and see if urgent care has contacted her or if she is able to come for additional labs?

## 2019-12-26 LAB — VITAMIN D 1,25 DIHYDROXY
Vitamin D 1, 25 (OH)2 Total: 88 pg/mL — ABNORMAL HIGH
Vitamin D2 1, 25 (OH)2: <10 pg/mL
Vitamin D3 1, 25 (OH)2: 86 pg/mL

## 2019-12-26 LAB — CBC
Hematocrit: 32.8 % — ABNORMAL LOW (ref 34.0–46.6)
Hemoglobin: 11.7 g/dL (ref 11.1–15.9)
MCH: 36.1 pg — ABNORMAL HIGH (ref 26.6–33.0)
MCHC: 35.7 g/dL (ref 31.5–35.7)
MCV: 101 fL — ABNORMAL HIGH (ref 79–97)
Platelets: 303 10*3/uL (ref 150–450)
RBC: 3.24 x10E6/uL — ABNORMAL LOW (ref 3.77–5.28)
RDW: 13.9 % (ref 11.7–15.4)
WBC: 6 10*3/uL (ref 3.4–10.8)

## 2019-12-26 LAB — FERRITIN: Ferritin: 84 ng/mL (ref 15–150)

## 2019-12-27 ENCOUNTER — Other Ambulatory Visit: Payer: Self-pay | Admitting: Internal Medicine

## 2019-12-27 DIAGNOSIS — R7989 Other specified abnormal findings of blood chemistry: Secondary | ICD-10-CM

## 2019-12-27 DIAGNOSIS — D539 Nutritional anemia, unspecified: Secondary | ICD-10-CM

## 2019-12-27 NOTE — Addendum Note (Signed)
Addended by: Molli Hazard A on: 12/27/2019 04:48 PM   Modules accepted: Orders

## 2019-12-27 NOTE — Progress Notes (Signed)
[facility]

## 2019-12-27 NOTE — ED Provider Notes (Signed)
[facility]    CSN: [id number] Arrival date & time: 12/24/19  1657      History   Chief Complaint Chief Complaint  Patient presents with  . Urinary Frequency    HPI [name] is a 49 y.o. female comes to the urgent care with complaints of increased urinary frequency without dysuria or urgency.  Patient denies any flank pain, fever or chills.  No vaginal discharge.  Patient also complains of increased fatigue, feeling depressed and some generalized body aches.  No fever, URI symptoms, cough or sputum production.  Symptoms of fatigue has been persistent for several weeks.  She denies any falls.  She has had some dizzy spells but they are not persistent.   Patient also complains of headaches light sensitivity.  No aura.  No numbness or tingling.  No dizziness.  HPI  Past Medical History:  Diagnosis Date  . Anemia   . Fibroids, submucosal   . History of blood transfusion 02/2011   [facility] - ? 2-3 units transfused    Patient Active Problem List   Diagnosis Date Noted  . Muscle pain 12/19/2019  . Anxiety 12/19/2019  . Radiculopathy 04/26/2019  . Cervical myelopathy ([place]) 03/07/2019  . Migraine 02/19/2019  . Shoulder pain, left 12/07/2018  . Iron deficiency anemia 11/07/2018  . Elevated blood pressure reading 11/07/2018  . Preventative health care 11/07/2018  . Postoperative state 04/16/2013  . Hematometra 01/09/2013  . Menorrhagia 01/09/2013  . Chronic pelvic pain in female 01/09/2013    Past Surgical History:  Procedure Laterality Date  . ANTERIOR CERVICAL DECOMPRESSION/DISCECTOMY FUSION 4 LEVELS N/A 04/26/2019   Procedure: ANTERIOR CERVICAL DECOMPRESSION FUSION CERVICAL 4-5, CERVICAL 5-6, CERVICAL 6-7 WITH INSTRUMENTATION AND ALLOGRAFT;  Surgeon: Phylliss Bob, MD;  Location: [facility];  Service: Orthopedics;  Laterality: N/A;  . APPENDECTOMY    . BILATERAL SALPINGECTOMY Bilateral 04/16/2013   Procedure: BILATERAL SALPINGECTOMY;  Surgeon: Lavonia Drafts, MD;  Location: [facility];  Service: Gynecology;  Laterality: Bilateral;  . [place], 2002  . DILATION AND CURETTAGE OF UTERUS N/A 03/31/2012   Procedure: DILATATION AND CURETTAGE;  Surgeon: Emily Filbert, MD;  Location: [facility];  Service: Gynecology;  Laterality: N/A;  . LAPAROSCOPIC LYSIS OF ADHESIONS N/A 03/31/2012   Procedure: LAPAROSCOPIC LYSIS OF ADHESIONS;  Surgeon: Emily Filbert, MD;  Location: [facility];  Service: Gynecology;  Laterality: N/A;  . LAPAROSCOPY N/A 03/31/2012   Procedure: LAPAROSCOPY OPERATIVE;  Surgeon: Emily Filbert, MD;  Location: [facility];  Service: Gynecology;  Laterality: N/A;  . NOVASURE ABLATION N/A 03/31/2012   Procedure: NOVASURE ABLATION;  Surgeon: Emily Filbert, MD;  Location: [facility];  Service: Gynecology;  Laterality: N/A;  . ROBOTIC ASSISTED TOTAL HYSTERECTOMY N/A 04/16/2013   Procedure: ROBOTIC ASSISTED TOTAL HYSTERECTOMY;  Surgeon: Lavonia Drafts, MD;  Location: [facility];  Service: Gynecology;  Laterality: N/A;  . TUBAL LIGATION  2002  . WISDOM TOOTH EXTRACTION      OB History    Gravida  3   Para  2   Term  2   Preterm      AB  1   Living  2     SAB  1   IAB      Ectopic      Multiple      Live Births               Home Medications    Prior to Admission medications   Medication  Sig Start Date End Date Taking? Authorizing Provider  acetaminophen (TYLENOL) 500 MG tablet Take 1,000 mg by mouth every 6 (six) hours as needed for moderate pain or headache.    [provider]  Acetaminophen-Caffeine 500-65 MG TABS Take 1 tablet by mouth every 6 (six) hours as needed. 07/02/19   Maudie Mercury, MD  ferrous sulfate 325 (65 FE) MG tablet Take 1 tablet (325 mg total) by mouth every other day. 11/08/18 03/11/28  Marcelyn Bruins, MD  SUMAtriptan (IMITREX) 50 MG tablet Take 1 tablet (50 mg total) by mouth once as needed for migraine. May repeat in 2 hours if headache persists or recurs. 12/10/19 12/10/20  [name], MD  Vitamin D, Ergocalciferol, (DRISDOL) 1.25 MG (50000 UNIT) CAPS capsule Take 1 capsule (50,000 Units total) by mouth every 7 (seven) days. 12/25/19   LampteyMyrene Galas, MD  azelastine (ASTELIN) 0.1 % nasal spray Place 1 spray into both nostrils 2 (two) times daily. Use in each nostril as directed Patient not taking: Reported on 03/05/2019 12/01/17 12/24/19  Shella Maxim, NP  DULoxetine (CYMBALTA) 30 MG capsule Take 1 capsule (30 mg total) by mouth daily. 12/19/19 12/24/19  Seawell, Jaimie A, DO  gabapentin (NEURONTIN) 300 MG capsule TAKE 1 CAPSULE(300 MG) BY MOUTH TWICE DAILY Patient not taking: Reported on 08/14/2019 05/23/19 12/24/19  Maudie Mercury, MD    Family History Family History  Problem Relation Age of Onset  . Hypertension Mother   . Seizures Father   . Fibroids Sister     Social History Social History   Tobacco Use  . Smoking status: Former Smoker    Packs/day: 0.00    Types: Cigarettes    Quit date: 01/21/2011    Years since quitting: 8.9  . Smokeless tobacco: Never Used  Vaping Use  . Vaping Use: Never used  Substance Use Topics  . Alcohol use: Yes    Comment: social occasions  . Drug use: No     Allergies   Bee venom, Bactrim [sulfamethoxazole-trimethoprim], and Codeine   Review of Systems Review of Systems  Constitutional: Positive for activity change and fatigue. Negative for diaphoresis and fever.  HENT: Negative.   Respiratory: Negative.   Genitourinary: Positive for frequency and urgency. Negative for dysuria.  Musculoskeletal: Negative.   Neurological: Positive for dizziness.  Psychiatric/Behavioral: Negative for confusion.     Physical Exam Triage Vital Signs ED Triage Vitals  Enc Vitals Group     BP 12/24/19 1758 136/76     Pulse Rate 12/24/19 1758 68     Resp 12/24/19 1758 18     Temp 12/24/19 1758 98.9 F (37.2 C)     Temp Source 12/24/19 1758 Oral     SpO2 12/24/19 1758 100 %     Weight --      Height --      Head  Circumference --      Peak Flow --      Pain Score 12/24/19 1759 0     Pain Loc --      Pain Edu? --      Excl. in [place]? --    No data found.  Updated Vital Signs BP 136/76 (BP Location: Right Arm)   Pulse 68   Temp 98.9 F (37.2 C) (Oral)   Resp 18   LMP 03/31/2013 (Exact Date)   SpO2 100%   Visual Acuity Right Eye Distance:   Left Eye Distance:   Bilateral Distance:    Right Eye  Near:   Left Eye Near:    Bilateral Near:     Physical Exam Vitals and nursing note reviewed.  Constitutional:      General: She is not in acute distress.    Appearance: She is not ill-appearing.     Comments: Looks depressed and tearful during the evaluation  Cardiovascular:     Rate and Rhythm: Normal rate and regular rhythm.     Pulses: Normal pulses.     Heart sounds: Normal heart sounds.  Pulmonary:     Effort: Pulmonary effort is normal.     Breath sounds: Normal breath sounds.  Neurological:     General: No focal deficit present.     Mental Status: She is alert and oriented to person, place, and time.      UC Treatments / Results  Labs (all labs ordered are listed, but only abnormal results are displayed) Labs Reviewed  VITAMIN D 25 HYDROXY (VIT D DEFICIENCY, FRACTURES) - Abnormal; Notable for the following components:      Result Value   Vit D, 25-Hydroxy 9.94 (*)    All other components within normal limits  TSH - Abnormal; Notable for the following components:   TSH 9.905 (*)    All other components within normal limits  POCT URINALYSIS DIPSTICK, ED / UC - Abnormal; Notable for the following components:   Hgb urine dipstick MODERATE (*)    All other components within normal limits  CBG MONITORING, ED    EKG   Radiology No results found.  Procedures Procedures (including critical care time)  Medications Ordered in UC Medications - No data to display  Initial Impression / Assessment and Plan / UC Course  I have reviewed the triage vital signs and the nursing  notes.  Pertinent labs & imaging results that were available during my care of the patient were reviewed by me and considered in my medical decision making (see chart for details).     1.  Fatigue likely secondary to vitamin D deficiency and hypothyroidism: Vitamin D 50,000 units weekly for 6 weeks Patient will need to follow-up with primary care physician for free T4 evaluation and possibly be started on levothyroxine. Return precautions given  2.  Migraine without aura: Continue medications prescribed by your health care provider a week ago. Final Clinical Impressions(s) / UC Diagnoses   Final diagnoses:  Fatigue, unspecified type  Worsening headaches     Discharge Instructions     Please continue migraine medications prescribed by your primary care physician Please sign up for my chart so you can see your lab results If your lab results are abnormal, we will call you with recommendations.    ED Prescriptions    None     PDMP not reviewed this encounter.   Chase Picket, MD 12/27/19 [phone]

## 2019-12-31 ENCOUNTER — Encounter: Payer: Self-pay | Admitting: Internal Medicine

## 2019-12-31 ENCOUNTER — Ambulatory Visit (INDEPENDENT_AMBULATORY_CARE_PROVIDER_SITE_OTHER): Payer: Medicaid Other | Admitting: Internal Medicine

## 2019-12-31 VITALS — BP 148/54 | HR 87 | Wt 246.9 lb

## 2019-12-31 DIAGNOSIS — E559 Vitamin D deficiency, unspecified: Secondary | ICD-10-CM | POA: Insufficient documentation

## 2019-12-31 DIAGNOSIS — E538 Deficiency of other specified B group vitamins: Secondary | ICD-10-CM

## 2019-12-31 DIAGNOSIS — G43009 Migraine without aura, not intractable, without status migrainosus: Secondary | ICD-10-CM

## 2019-12-31 DIAGNOSIS — D539 Nutritional anemia, unspecified: Secondary | ICD-10-CM | POA: Diagnosis not present

## 2019-12-31 DIAGNOSIS — E063 Autoimmune thyroiditis: Secondary | ICD-10-CM | POA: Insufficient documentation

## 2019-12-31 DIAGNOSIS — R5383 Other fatigue: Secondary | ICD-10-CM | POA: Diagnosis not present

## 2019-12-31 DIAGNOSIS — R7989 Other specified abnormal findings of blood chemistry: Secondary | ICD-10-CM

## 2019-12-31 DIAGNOSIS — R946 Abnormal results of thyroid function studies: Secondary | ICD-10-CM

## 2019-12-31 NOTE — Progress Notes (Signed)
   CC: Elevated TSH  HPI:  [name] is a 49 y.o. female, with a PMH noted below, who presents for a follow up appointment from the [facility] urgent care. To see the management of her acute and chronic conditions, please see the A&P note under the Encounters tab.   Past Medical History:  Diagnosis Date  . Anemia   . Fibroids, submucosal   . History of blood transfusion 02/2011   [facility] - ? 2-3 units transfused   Review of Systems:   Review of Systems  Constitutional: Negative for chills, fever and weight loss.       Improvement from her fatigue after starting Vitamin D supplement.   Eyes: Negative for blurred vision and double vision.  Respiratory: Negative for shortness of breath.   Cardiovascular: Negative for chest pain and palpitations.  Gastrointestinal: Negative for abdominal pain, constipation, diarrhea, nausea and vomiting.  Musculoskeletal: Negative for myalgias.  Skin: Negative for rash.    Physical Exam:  Vitals:   12/31/19 1028  BP: (!) 148/54  Pulse: 87  SpO2: 100%  Weight: 246 lb 14.4 oz (112 kg)   Physical Exam Constitutional:      General: She is not in acute distress.    Appearance: Normal appearance. She is not ill-appearing, toxic-appearing or diaphoretic.  Neck:     Comments: Thyroid non-tender to palpation, no nodules or enlargement noted.  Cardiovascular:     Rate and Rhythm: Normal rate and regular rhythm.     Pulses: Normal pulses.     Heart sounds: Normal heart sounds. No murmur heard. No friction rub. No gallop.   Pulmonary:     Effort: Pulmonary effort is normal.     Breath sounds: Normal breath sounds. No wheezing, rhonchi or rales.  Musculoskeletal:     Cervical back: Normal range of motion and neck supple. No rigidity or tenderness.  Neurological:     Mental Status: She is alert and oriented to person, place, and time.  Psychiatric:        Mood and Affect: Mood normal.        Behavior: Behavior normal.     Assessment & Plan:    See Encounters Tab for problem based charting.  Patient discussed with Dr. Dareen Piano

## 2019-12-31 NOTE — Assessment & Plan Note (Signed)
Patient presents to the clinic after subsequent evaluation at urgent care, for fatigue. During her appointment it was found that her TSH was elevated to 9.9. She was instructed to follow up with her PCP for further evaluation. Patient states that she has been feeling better since starting her Vitamin D supplementation, but is still somewhat fatigued.   A/P  Patient presents to the clinic with elevated TSH, and symptoms concerning for hypothyroidism (fatigue, weight gain, depressed mood). Will get a Free T4 today to evaluate for hypothyroidism.  - Free T4 - Pending results, will contact patient for either further testing, or to initiate Levothyroxine with a 4-6 week follow up and repeat labs

## 2019-12-31 NOTE — Patient Instructions (Signed)
To [name],   It was good to see you again! Today we discussed your elevated TSH level, Low vitamin D levels, and migraines. Please continue taking your vitamin D replacement medication. We got some blood work to further work up your TSH level, and may start you on a medication called Levothyroxine pending the results. I will call you with the results, when they arrive, and we will discuss starting the medication, if indicated, or if further lab work is needed. We will see you in 4-6 weeks to follow up your thyroid function. Have a happy holiday season!  Sincerely,  Maudie Mercury, MD

## 2019-12-31 NOTE — Assessment & Plan Note (Signed)
Patient presents for follow up from her urgent care visit. Patient states that she at the time she had been having fatigue for months.   During her urgent care visit, it was found that her Vit D, 25-Hydroxy was 9.94. She was started on Drisdol 1.25 mg weekly for 6 weeks.  Patient states that she feels much improved since starting the medication. She is compliant and has enough of the medication.   A/P:  Patient found to have Vitamin D deficiency during urgent care visit. Currently taking Drisdol 1.25 mg weekly to complete 6 weeks course. Patient endorsing improvement with medication.   - Complete full course of Drisdol 1.25 mg once a week.  - reassess Vit D, 25-Hydroxy in 10-12 weeks

## 2020-01-01 ENCOUNTER — Other Ambulatory Visit: Payer: Self-pay

## 2020-01-01 ENCOUNTER — Ambulatory Visit: Payer: Medicaid Other | Attending: Orthopedic Surgery | Admitting: Physical Therapy

## 2020-01-01 ENCOUNTER — Encounter: Payer: Self-pay | Admitting: Physical Therapy

## 2020-01-01 DIAGNOSIS — M4322 Fusion of spine, cervical region: Secondary | ICD-10-CM | POA: Insufficient documentation

## 2020-01-01 DIAGNOSIS — R293 Abnormal posture: Secondary | ICD-10-CM | POA: Diagnosis present

## 2020-01-01 DIAGNOSIS — M6281 Muscle weakness (generalized): Secondary | ICD-10-CM | POA: Insufficient documentation

## 2020-01-01 LAB — T4, FREE: Free T4: 1.03 ng/dL (ref 0.82–1.77)

## 2020-01-01 NOTE — Progress Notes (Signed)
Internal Medicine Clinic Attending ? ?Case discussed with Dr. Winters  At the time of the visit.  We reviewed the resident?s history and exam and pertinent patient test results.  I agree with the assessment, diagnosis, and plan of care documented in the resident?s note.  ?

## 2020-01-02 ENCOUNTER — Encounter: Payer: Self-pay | Admitting: Physical Therapy

## 2020-01-02 DIAGNOSIS — D510 Vitamin B12 deficiency anemia due to intrinsic factor deficiency: Secondary | ICD-10-CM | POA: Insufficient documentation

## 2020-01-02 MED ORDER — CYANOCOBALAMIN 1000 MCG/ML IJ SOLN
1000.0000 ug | INTRAMUSCULAR | 0 refills | Status: DC
Start: 2020-01-02 — End: 2020-01-15

## 2020-01-02 NOTE — Patient Instructions (Signed)
Continue with current stretches

## 2020-01-02 NOTE — Progress Notes (Deleted)
Thank you, Additionally, given that her TSH is elevated and her free T4 is wnl, I did some reading on it yesterday, and would we also start her on levothyroxine given this subclinical hypothyroidism, as her TSH is <10?

## 2020-01-02 NOTE — Addendum Note (Signed)
Addended by: Maudie Mercury C on: 01/02/2020 11:44 AM   Modules accepted: Orders

## 2020-01-02 NOTE — Addendum Note (Signed)
Addended by: Carney Living on: 01/02/2020 01:21 PM   Modules accepted: Orders

## 2020-01-02 NOTE — Therapy (Signed)
[place], [place], [address] Phone: [phone]   Fax:  [phone]  Physical Therapy Evaluation  Patient Details  Name: [name] MRN: [id number] Date of Birth: 1970/05/15 Referring Provider (PT): Dr Phylliss Bob   Encounter Date: 01/01/2020   PT End of Session - 01/02/20 1310    Visit Number 1    Number of Visits 6    Date for PT Re-Evaluation 02/13/20    Authorization Type [employer]    Authorization - Visit Number 11    Authorization - Number of Visits 27    PT Start Time 8563    PT Stop Time 1628    PT Time Calculation (min) 43 min    Activity Tolerance Patient tolerated treatment well    Behavior During Therapy [facility] for tasks assessed/performed           Past Medical History:  Diagnosis Date  . Anemia   . Fibroids, submucosal   . History of blood transfusion 02/2011   [facility] - ? 2-3 units transfused    Past Surgical History:  Procedure Laterality Date  . ANTERIOR CERVICAL DECOMPRESSION/DISCECTOMY FUSION 4 LEVELS N/A 04/26/2019   Procedure: ANTERIOR CERVICAL DECOMPRESSION FUSION CERVICAL 4-5, CERVICAL 5-6, CERVICAL 6-7 WITH INSTRUMENTATION AND ALLOGRAFT;  Surgeon: Phylliss Bob, MD;  Location: [facility];  Service: Orthopedics;  Laterality: N/A;  . APPENDECTOMY    . BILATERAL SALPINGECTOMY Bilateral 04/16/2013   Procedure: BILATERAL SALPINGECTOMY;  Surgeon: Lavonia Drafts, MD;  Location: [facility];  Service: Gynecology;  Laterality: Bilateral;  . [place], 2002  . DILATION AND CURETTAGE OF UTERUS N/A 03/31/2012   Procedure: DILATATION AND CURETTAGE;  Surgeon: Emily Filbert, MD;  Location: [facility];  Service: Gynecology;  Laterality: N/A;  . LAPAROSCOPIC LYSIS OF ADHESIONS N/A 03/31/2012   Procedure: LAPAROSCOPIC LYSIS OF ADHESIONS;  Surgeon: Emily Filbert, MD;  Location: [facility];  Service: Gynecology;  Laterality: N/A;  . LAPAROSCOPY N/A 03/31/2012   Procedure: LAPAROSCOPY OPERATIVE;   Surgeon: Emily Filbert, MD;  Location: [facility];  Service: Gynecology;  Laterality: N/A;  . NOVASURE ABLATION N/A 03/31/2012   Procedure: NOVASURE ABLATION;  Surgeon: Emily Filbert, MD;  Location: [facility];  Service: Gynecology;  Laterality: N/A;  . ROBOTIC ASSISTED TOTAL HYSTERECTOMY N/A 04/16/2013   Procedure: ROBOTIC ASSISTED TOTAL HYSTERECTOMY;  Surgeon: Lavonia Drafts, MD;  Location: [facility];  Service: Gynecology;  Laterality: N/A;  . TUBAL LIGATION  2002  . WISDOM TOOTH EXTRACTION      There were no vitals filed for this visit.    Subjective Assessment - 01/01/20 1549    Subjective Patient has had a migrane every week since her last PT visit. Her last one lasted until 12/27/2019. She feels like sometimes smells can set it off. She feels the pain behind her eyes and in her temples. She can use an ice pack at times to calm it down. Her neck has been hurting as well. She has had some pain in her upper trap.    Pertinent History Hysterectomy, 2 cesareans, appendectomy    Limitations Sitting;Lifting;House hold activities    Patient Stated Goals Be able to take control of the pain and continue to heal to 100%    Currently in Pain? Yes    Pain Score 8     Pain Location Head    Pain Orientation Right;Left    Pain Descriptors / Indicators Aching    Pain Type Chronic pain  Pain Radiating Towards into her upper trap    Pain Onset More than a month ago    Pain Frequency Constant    Aggravating Factors  certain smells, can not get comfortable at night    Pain Relieving Factors ice    Effect of Pain on Daily Activities headaches effecting her ability to work              [facility] PT Assessment - 01/02/20 0001      Assessment   Medical Diagnosis Headaches    Referring Provider (PT) Dr Phylliss Bob    Onset Date/Surgical Date 12/07/19    Hand Dominance Right    Next MD Visit After her CT    Prior Therapy yes for neck. discharged recently      Precautions   Precaution Comments will  have a ct scan on her fusion oin Junuary      Restrictions   Weight Bearing Restrictions No      Balance Screen   Has the patient fallen in the past 6 months No    Has the patient had a decrease in activity level because of a fear of falling?  No    Is the patient reluctant to leave their home because of a fear of falling?  No      Prior Function   Level of Independence Independent    Vocation Other (comment)    Vocation Requirements Stylist, part time at [employer]   Overall Cognitive Status Within Functional Limits for tasks assessed    Attention Focused    Focused Attention Appears intact    Memory Appears intact    Awareness Appears intact    Problem Solving Appears intact      Observation/Other Assessments   Focus on Therapeutic Outcomes (FOTO)  NA-MCD      Sensation   Light Touch Appears Intact    Additional Comments a little tingling in the right hand      Coordination   Gross Motor Movements are Fluid and Coordinated Yes    Fine Motor Movements are Fluid and Coordinated Yes      Posture/Postural Control   Postural Limitations Rounded Shoulders;Forward head;Increased thoracic kyphosis      AROM   Cervical Flexion 44    Cervical Extension 50    Cervical - Right Rotation 70    Cervical - Left Rotation 68      Strength   Right Shoulder Flexion 4/5    Right Shoulder Internal Rotation 5/5    Right Shoulder External Rotation 5/5    Left Shoulder Flexion 4/5    Left Shoulder Extension 5/5    Left Shoulder Internal Rotation 5/5    Left Shoulder External Rotation 5/5      Palpation   Palpation comment significant spasming in bilateral upper traps L>R                      Objective measurements completed on examination: See above findings.       [facility] Adult PT Treatment/Exercise - 01/02/20 0001      Manual Therapy   Soft tissue mobilization Left upper trap/levator scap and bilateral cervical paraspinals using IASTYM     Myofascial  Release Suboccipital release            Trigger Point Dry Needling - 01/02/20 0001    Consent Given? Yes    Other Dry Needling 2 spots in each upper trap  Upper Trapezius Response Twitch reponse elicited;Palpable increased muscle length    Cervical multifidi Response Palpable increased muscle length;Twitch reponse elicited                PT Education - 01/01/20 1554    Education Details reviewed HEP, symptom mangegement, benefits and riskof trigger point dry needling    Person(s) Educated Patient    Methods Explanation;Demonstration;Tactile cues;Verbal cues    Comprehension Verbalized understanding;Returned demonstration;Tactile cues required;Verbal cues required            PT Short Term Goals - 01/02/20 1316      PT SHORT TERM GOAL #1   Title Patient will increase bilateral shoulder flexion strength to 4+/5    Time 3    Period Weeks    Status New    Target Date 01/23/20      PT SHORT TERM GOAL #2   Title Pt will demonstrates pain free B cervical rotation to >/= 65 degrees.    Time 3    Period Weeks    Status New    Target Date 01/23/20      PT SHORT TERM GOAL #3   Title Patient will report a 50% reduction in headache frequency and intensity    Time 3    Period Weeks    Status New    Target Date 01/23/20             PT Long Term Goals - 01/02/20 1317      PT LONG TERM GOAL #1   Title Patient will report a 75% reduction in headache frequncy and intesity in order to work    Time 6    Period Weeks    Status New    Target Date 02/13/20      PT LONG TERM GOAL #2   Title Patient will use her arms at work without increased headaches or pain in ipper traps and arms    Time 6    Period Weeks    Status New    Target Date 02/13/20                  Plan - 01/02/20 0836    Clinical Impression Statement Patient returns for evalaution following new onset of migrane headaches starting 3 weeks ago. She has headaches 1x a week that can oast 2-3  days. When she has the pain they can reacha 10/10 and are causing her to miss work. The MD will be perfroming a CT of her cerical spine to look at her fusion. She will be having that in january. She has significant spasming in her upper traps and cervical paraspinals. Her motion continues to be constent from discharge. She has mild wekaness in her bilateral shoulders.    Personal Factors and Comorbidities Age;Time since onset of injury/illness/exacerbation;Past/Current Experience;Profession    Comorbidities Anemia    Examination-Activity Limitations Sit;Sleep;Carry;Lift    Examination-Participation Restrictions Driving;Community Activity;Shop;Cleaning;Laundry    Stability/Clinical Decision Making Stable/Uncomplicated    Clinical Decision Making Low    Rehab Potential Good    PT Frequency 1x / week    PT Duration 8 weeks    PT Treatment/Interventions Spinal Manipulations;Joint Manipulations;Taping;Dry needling;Passive range of motion;Manual techniques;Patient/family education;Therapeutic exercise;Neuromuscular re-education;Therapeutic activities;Functional mobility training;Traction;Moist Heat;Iontophoresis 4mg /ml Dexamethasone;ADLs/Self Care Home Management;Cryotherapy;Electrical Stimulation    PT Next Visit Plan Assess HEP and progress PRN, dry needling and manual for left upper trap/levator/suboccipitals, postural strengthening    PT Home Exercise Plan [id number]    Consulted and Agree with  Plan of Care Patient           Patient will benefit from skilled therapeutic intervention in order to improve the following deficits and impairments:  Pain,Postural dysfunction,Impaired flexibility,Increased fascial restricitons,Decreased strength,Decreased activity tolerance,Improper body mechanics,Impaired perceived functional ability,Hypomobility  Visit Diagnosis: Fusion of spine, cervical region  Abnormal posture  Muscle weakness (generalized)   Check all possible CPT codes: 97110- Therapeutic  Exercise, [phone]- Neuro Re-education, [phone] - Gait Training, [phone] - Manual Therapy, 97530 - Therapeutic Activities, 97535 - Self Care and 97014 - Electrical stimulation (unattended)          Problem List Patient Active Problem List   Diagnosis Date Noted  . Vitamin B12 deficiency 01/02/2020  . Vitamin D deficiency 12/31/2019  . Elevated TSH 12/31/2019  . Muscle pain 12/19/2019  . Anxiety 12/19/2019  . Radiculopathy 04/26/2019  . Cervical myelopathy ([place]) 03/07/2019  . Migraine 02/19/2019  . Shoulder pain, left 12/07/2018  . Iron deficiency anemia 11/07/2018  . Elevated blood pressure reading 11/07/2018  . Preventative health care 11/07/2018  . Postoperative state 04/16/2013  . Hematometra 01/09/2013  . Menorrhagia 01/09/2013  . Chronic pelvic pain in female 01/09/2013    Carney Living PT DPT  01/02/2020, 1:18 PM  [facility] [address] [place], [place], [address] Phone: [phone]   Fax:  [phone]  Name: [name] MRN: [id number] Date of Birth: November 19, 1970

## 2020-01-02 NOTE — Assessment & Plan Note (Addendum)
ADDENDUM:  Received patient's vitamin B12 level of 75. Called patient and discussed her labs. Will start IM B12 injections and will transition over to oral.  - 1,000 mcg/mL cyanocobalamin 3 times a week for 1 week.  - After first week then 1,000 mcg/mL cyanocobalamin weekly for 8 weeks.  - Follow up in 9 weeks. If sufficient B12 stores, transition to oral route.

## 2020-01-04 ENCOUNTER — Telehealth: Payer: Self-pay | Admitting: *Deleted

## 2020-01-04 NOTE — Telephone Encounter (Addendum)
Information was called to [facility] at [phone] for PA for Cyanocobalamin 1000mg /ml.  Awaiting determination within 24 hours.  Sander Nephew, RN 10:54 AM. PA for Cyanocobalam Inj 1000 ml was approved 01/04/2020 thru 02/04/2020.  Sander Nephew, RN 12/20/021 11:35 AM.

## 2020-01-06 LAB — METHYLMALONIC ACID, SERUM: Methylmalonic Acid: 899 nmol/L — ABNORMAL HIGH (ref 0–378)

## 2020-01-06 LAB — VITAMIN B12: Vitamin B-12: 75 pg/mL — ABNORMAL LOW (ref 232–1245)

## 2020-01-14 ENCOUNTER — Telehealth: Payer: Self-pay

## 2020-01-14 DIAGNOSIS — E538 Deficiency of other specified B group vitamins: Secondary | ICD-10-CM

## 2020-01-14 NOTE — Telephone Encounter (Signed)
Pt called stated that she got the B-12 shot to given at home but she is scared to inject her self . She stated that first shot was given by a friend. Please call back

## 2020-01-14 NOTE — Telephone Encounter (Signed)
Pt has used all her needles trying to be brave enough to give self shot and cant. She would like to come in for the injections, she called the insurance co and they will pay for her to come to office. Could you please put order in and how often. She will come in 12/28 at 1045

## 2020-01-15 ENCOUNTER — Other Ambulatory Visit (INDEPENDENT_AMBULATORY_CARE_PROVIDER_SITE_OTHER): Payer: Medicaid Other | Admitting: *Deleted

## 2020-01-15 DIAGNOSIS — E538 Deficiency of other specified B group vitamins: Secondary | ICD-10-CM

## 2020-01-15 MED ORDER — CYANOCOBALAMIN 1000 MCG/ML IJ SOLN
1000.0000 ug | INTRAMUSCULAR | Status: DC
Start: 2020-01-16 — End: 2020-03-03
  Administered 2020-01-15 – 2020-02-21 (×6): 1000 ug via INTRAMUSCULAR

## 2020-01-15 NOTE — Telephone Encounter (Signed)
I placed the order for in house injection of Vitamin B12.  Please have patient come in tomorrow for first injection.    Thank you  Debe Coder, MD

## 2020-01-15 NOTE — Telephone Encounter (Signed)
Wonderful. Thank you.

## 2020-01-15 NOTE — Telephone Encounter (Signed)
Pt came in today; B12 injection was given.

## 2020-01-16 ENCOUNTER — Ambulatory Visit: Payer: Medicaid Other | Admitting: Physical Therapy

## 2020-01-16 ENCOUNTER — Other Ambulatory Visit: Payer: Self-pay

## 2020-01-16 DIAGNOSIS — R293 Abnormal posture: Secondary | ICD-10-CM

## 2020-01-16 DIAGNOSIS — M6281 Muscle weakness (generalized): Secondary | ICD-10-CM

## 2020-01-16 DIAGNOSIS — M4322 Fusion of spine, cervical region: Secondary | ICD-10-CM

## 2020-01-17 ENCOUNTER — Encounter: Payer: Self-pay | Admitting: Physical Therapy

## 2020-01-17 NOTE — Therapy (Signed)
[place], [place], [address] Phone: [phone]   Fax:  [phone]  Physical Therapy Treatment  Patient Details  Name: [name] MRN: [id number] Date of Birth: 1970-09-03 Referring Provider (PT): Dr Phylliss Bob   Encounter Date: 01/16/2020   PT End of Session - 01/17/20 1337    Visit Number 2    Number of Visits 6    Date for PT Re-Evaluation 02/13/20    Authorization - Visit Number 13    PT Start Time 1630    PT Stop Time 1711    PT Time Calculation (min) 41 min    Activity Tolerance Patient tolerated treatment well    Behavior During Therapy [facility] for tasks assessed/performed           Past Medical History:  Diagnosis Date  . Anemia   . Fibroids, submucosal   . History of blood transfusion 02/2011   [facility] - ? 2-3 units transfused    Past Surgical History:  Procedure Laterality Date  . ANTERIOR CERVICAL DECOMPRESSION/DISCECTOMY FUSION 4 LEVELS N/A 04/26/2019   Procedure: ANTERIOR CERVICAL DECOMPRESSION FUSION CERVICAL 4-5, CERVICAL 5-6, CERVICAL 6-7 WITH INSTRUMENTATION AND ALLOGRAFT;  Surgeon: Phylliss Bob, MD;  Location: [facility];  Service: Orthopedics;  Laterality: N/A;  . APPENDECTOMY    . BILATERAL SALPINGECTOMY Bilateral 04/16/2013   Procedure: BILATERAL SALPINGECTOMY;  Surgeon: Lavonia Drafts, MD;  Location: [facility];  Service: Gynecology;  Laterality: Bilateral;  . [place], 2002  . DILATION AND CURETTAGE OF UTERUS N/A 03/31/2012   Procedure: DILATATION AND CURETTAGE;  Surgeon: Emily Filbert, MD;  Location: [facility];  Service: Gynecology;  Laterality: N/A;  . LAPAROSCOPIC LYSIS OF ADHESIONS N/A 03/31/2012   Procedure: LAPAROSCOPIC LYSIS OF ADHESIONS;  Surgeon: Emily Filbert, MD;  Location: [facility];  Service: Gynecology;  Laterality: N/A;  . LAPAROSCOPY N/A 03/31/2012   Procedure: LAPAROSCOPY OPERATIVE;  Surgeon: Emily Filbert, MD;  Location: [facility];  Service: Gynecology;   Laterality: N/A;  . NOVASURE ABLATION N/A 03/31/2012   Procedure: NOVASURE ABLATION;  Surgeon: Emily Filbert, MD;  Location: [facility];  Service: Gynecology;  Laterality: N/A;  . ROBOTIC ASSISTED TOTAL HYSTERECTOMY N/A 04/16/2013   Procedure: ROBOTIC ASSISTED TOTAL HYSTERECTOMY;  Surgeon: Lavonia Drafts, MD;  Location: [facility];  Service: Gynecology;  Laterality: N/A;  . TUBAL LIGATION  2002  . WISDOM TOOTH EXTRACTION      There were no vitals filed for this visit.   Subjective Assessment - 01/17/20 1336    Subjective Patient has not had a migrnae since the last visit. dshe has minor neck pain when she is working but no headaches. She feels like the needling and manual therapy worked well.    Pertinent History Hysterectomy, 2 cesareans, appendectomy    Limitations Sitting;Lifting;House hold activities    Patient Stated Goals Be able to take control of the pain and continue to heal to 100%    Currently in Pain? No/denies                             [facility] Adult PT Treatment/Exercise - 01/17/20 0001      Neck Exercises: Seated   Other Seated Exercise bilateral ERred 10; horizontal abduction 10; reviewed for HEP      Manual Therapy   Soft tissue mobilization Left upper trap/levator scap and bilateral cervical paraspinals using IASTYM     Myofascial Release  Suboccipital release    Manual Traction With suboccipital release      Neck Exercises: Stretches   Upper Trapezius Stretch 20 seconds;2 reps    Levator Stretch 20 seconds;2 reps            Trigger Point Dry Needling - 01/17/20 0001    Consent Given? Yes    Other Dry Needling 2 spots in each upper trap    Upper Trapezius Response Twitch reponse elicited;Palpable increased muscle length    Cervical multifidi Response Palpable increased muscle length;Twitch reponse elicited                PT Education - 01/17/20 1337    Education Details reviewed strewtches and light exercises to continue with at home     Person(s) Educated Patient    Methods Explanation;Demonstration;Verbal cues;Tactile cues    Comprehension Returned demonstration;Verbal cues required;Verbalized understanding;Tactile cues required            PT Short Term Goals - 01/02/20 1316      PT SHORT TERM GOAL #1   Title Patient will increase bilateral shoulder flexion strength to 4+/5    Time 3    Period Weeks    Status New    Target Date 01/23/20      PT SHORT TERM GOAL #2   Title Pt will demonstrates pain free B cervical rotation to >/= 65 degrees.    Time 3    Period Weeks    Status New    Target Date 01/23/20      PT SHORT TERM GOAL #3   Title Patient will report a 50% reduction in headache frequency and intensity    Time 3    Period Weeks    Status New    Target Date 01/23/20             PT Long Term Goals - 01/02/20 1317      PT LONG TERM GOAL #1   Title Patient will report a 75% reduction in headache frequncy and intesity in order to work    Time 6    Period Weeks    Status New    Target Date 02/13/20      PT LONG TERM GOAL #2   Title Patient will use her arms at work without increased headaches or pain in ipper traps and arms    Time 6    Period Weeks    Status New    Target Date 02/13/20                 Plan - 01/17/20 1338    Clinical Impression Statement Patient had a good tewtich repsose to needling in bilateral upper traps. She tolerated manual therapy well. Therapy reviewed stretches and exercises to continue with at home. She will follow up in a week or two.    Personal Factors and Comorbidities Age;Time since onset of injury/illness/exacerbation;Past/Current Experience;Profession    Comorbidities Anemia    Examination-Activity Limitations Sit;Sleep;Carry;Lift    Examination-Participation Restrictions Driving;Community Activity;Shop;Cleaning;Laundry    Stability/Clinical Decision Making Stable/Uncomplicated    Clinical Decision Making Low    Rehab Potential Good    PT  Frequency 1x / week    PT Duration 8 weeks    PT Treatment/Interventions Spinal Manipulations;Joint Manipulations;Taping;Dry needling;Passive range of motion;Manual techniques;Patient/family education;Therapeutic exercise;Neuromuscular re-education;Therapeutic activities;Functional mobility training;Traction;Moist Heat;Iontophoresis 4mg /ml Dexamethasone;ADLs/Self Care Home Management;Cryotherapy;Electrical Stimulation    PT Next Visit Plan Assess HEP and progress PRN, dry needling and manual for left upper trap/levator/suboccipitals,  postural strengthening    PT Home Exercise Plan [id number]    Consulted and Agree with Plan of Care Patient           Patient will benefit from skilled therapeutic intervention in order to improve the following deficits and impairments:  Pain,Postural dysfunction,Impaired flexibility,Increased fascial restricitons,Decreased strength,Decreased activity tolerance,Improper body mechanics,Impaired perceived functional ability,Hypomobility  Visit Diagnosis: Fusion of spine, cervical region  Abnormal posture  Muscle weakness (generalized)     Problem List Patient Active Problem List   Diagnosis Date Noted  . Vitamin B12 deficiency 01/02/2020  . Vitamin D deficiency 12/31/2019  . Elevated TSH 12/31/2019  . Muscle pain 12/19/2019  . Anxiety 12/19/2019  . Radiculopathy 04/26/2019  . Cervical myelopathy ([place]) 03/07/2019  . Migraine 02/19/2019  . Shoulder pain, left 12/07/2018  . Iron deficiency anemia 11/07/2018  . Elevated blood pressure reading 11/07/2018  . Preventative health care 11/07/2018  . Postoperative state 04/16/2013  . Hematometra 01/09/2013  . Menorrhagia 01/09/2013  . Chronic pelvic pain in female 01/09/2013    Carney Living PT DPT  01/17/2020, 1:43 PM  [facility] [address] [place], [place], [address] Phone: [phone]   Fax:  [phone]  Name: [name] MRN:  [id number] Date of Birth: 23-Jul-1970

## 2020-01-22 ENCOUNTER — Other Ambulatory Visit (INDEPENDENT_AMBULATORY_CARE_PROVIDER_SITE_OTHER): Payer: Medicaid Other | Admitting: *Deleted

## 2020-01-22 ENCOUNTER — Other Ambulatory Visit: Payer: Self-pay

## 2020-01-22 DIAGNOSIS — E538 Deficiency of other specified B group vitamins: Secondary | ICD-10-CM | POA: Diagnosis not present

## 2020-01-22 NOTE — Progress Notes (Signed)
   Patient presents for Bit B12 injection 1000 mcg given IM right deltoid Patient tolerated injection well. Next dose due in 7 days.  Kinnie Feil, BSN, RN-BC

## 2020-01-23 ENCOUNTER — Ambulatory Visit: Payer: Medicaid Other | Attending: Orthopedic Surgery | Admitting: Physical Therapy

## 2020-01-23 ENCOUNTER — Other Ambulatory Visit: Payer: Self-pay

## 2020-01-23 DIAGNOSIS — R293 Abnormal posture: Secondary | ICD-10-CM

## 2020-01-23 DIAGNOSIS — M4322 Fusion of spine, cervical region: Secondary | ICD-10-CM | POA: Diagnosis not present

## 2020-01-23 DIAGNOSIS — M6281 Muscle weakness (generalized): Secondary | ICD-10-CM

## 2020-01-23 NOTE — Therapy (Signed)
[facility] [address] [place], [place], [address] Phone: [phone]   Fax:  [phone]  Physical Therapy Treatment  Patient Details  Name: [name] MRN: [id number] Date of Birth: June 21, 1970 Referring Provider (PT): Dr Estill Bamberg   Encounter Date: 01/23/2020   PT End of Session - 01/23/20 1716    Visit Number 3    Number of Visits 6    Date for PT Re-Evaluation 02/13/20    Authorization Type M[employer]    Authorization - Visit Number 2    Authorization - Number of Visits 27    PT Start Time 1634    PT Stop Time 1716    PT Time Calculation (min) 42 min    Activity Tolerance Patient tolerated treatment well    Behavior During Therapy [facility] for tasks assessed/performed           Past Medical History:  Diagnosis Date  . Anemia   . Fibroids, submucosal   . History of blood transfusion 02/2011   [facility] - ? 2-3 units transfused    Past Surgical History:  Procedure Laterality Date  . ANTERIOR CERVICAL DECOMPRESSION/DISCECTOMY FUSION 4 LEVELS N/A 04/26/2019   Procedure: ANTERIOR CERVICAL DECOMPRESSION FUSION CERVICAL 4-5, CERVICAL 5-6, CERVICAL 6-7 WITH INSTRUMENTATION AND ALLOGRAFT;  Surgeon: Estill Bamberg, MD;  Location: [facility];  Service: Orthopedics;  Laterality: N/A;  . APPENDECTOMY    . BILATERAL SALPINGECTOMY Bilateral 04/16/2013   Procedure: BILATERAL SALPINGECTOMY;  Surgeon: Willodean Rosenthal, MD;  Location: [facility];  Service: Gynecology;  Laterality: Bilateral;  . CESAREAN SECTION  1994, 2002  . DILATION AND CURETTAGE OF UTERUS N/A 03/31/2012   Procedure: DILATATION AND CURETTAGE;  Surgeon: Allie Bossier, MD;  Location: [facility];  Service: Gynecology;  Laterality: N/A;  . LAPAROSCOPIC LYSIS OF ADHESIONS N/A 03/31/2012   Procedure: LAPAROSCOPIC LYSIS OF ADHESIONS;  Surgeon: Allie Bossier, MD;  [facility];  Service: Gynecology;  Laterality: N/A;  . LAPAROSCOPY N/A 03/31/2012   Procedure: LAPAROSCOPY OPERATIVE;   Surgeon: Allie Bossier, MD;  [facility];  Service: Gynecology;  Laterality: N/A;  . NOVASURE ABLATION N/A 03/31/2012   Procedure: NOVASURE ABLATION;  Surgeon: Allie Bossier, MD;  Location[facility];  Service: Gynecology;  Laterality: N/A;  . ROBOTIC ASSISTED TOTAL HYSTERECTOMY N/A 04/16/2013   Procedure: ROBOTIC ASSISTED TOTAL HYSTERECTOMY;  Surgeon: Willodean Rosenthal, MD;  Location: [facility];  Service: Gynecology;  Laterality: N/A;  . TUBAL LIGATION  2002  . WISDOM TOOTH EXTRACTION      There were no vitals filed for this visit.   Subjective Assessment - 01/23/20 1638    Subjective "I've been pretty good, I haven't had anything major since the last session regarding the migraines. I am getting a CT scan in the next week."    Patient Stated Goals Be able to take control of the pain and continue to heal to 100%              [facility] PT Assessment - 01/23/20 0001      Assessment   Medical Diagnosis Headaches    Referring Provider (PT) Dr Estill Bamberg    Onset Date/Surgical Date 12/07/19                         [facility] Adult PT Treatment/Exercise - 01/23/20 0001      Neck Exercises: Theraband   Other Theraband Exercises double ER with retraction 2 x 10 with  green band      Neck Exercises: Supine   Neck Retraction 5 reps;10 secs    Capital Flexion 5 reps;10 secs   chin tuck head lift     Lumbar Exercises: Seated   Other Seated Lumbar Exercises anterior pelvic tilt 1 x 10 holding 5 seconds      Manual Therapy   Manual therapy comments skilled palpation and pt monitoring during TPDN    Joint Mobilization T1-T10 PA grade III, bil first rib infrior mob grade III    Soft tissue mobilization Left upper trap/levator scap and bilateral cervical paraspinals using IASTYM     Manual Traction With suboccipital release      Neck Exercises: Stretches   Upper Trapezius Stretch 30 seconds;1 rep;Right;Left    Levator Stretch 1 rep;Left;Right;30 seconds            Trigger  Point Dry Needling - 01/23/20 0001    Consent Given? Yes    Education Handout Provided Previously provided    Muscles Treated Upper Quadrant Lower trapezius    Electrical Stimulation Performed with Dry Needling Yes    E-stim with Dry Needling Details frequency at 20, intensity to tolerance increasing as able x 8 min    Lower trapezius Response Twitch response elicited;Palpable increased muscle length   bil               PT Education - 01/23/20 1716    Education Details efficient posture in sitting/ standing    Person(s) Educated Patient    Methods Explanation;Verbal cues;Handout    Comprehension Verbalized understanding            PT Short Term Goals - 01/02/20 1316      PT SHORT TERM GOAL #1   Title Patient will increase bilateral shoulder flexion strength to 4+/5    Time 3    Period Weeks    Status New    Target Date 01/23/20      PT SHORT TERM GOAL #2   Title Pt will demonstrates pain free B cervical rotation to >/= 65 degrees.    Time 3    Period Weeks    Status New    Target Date 01/23/20      PT SHORT TERM GOAL #3   Title Patient will report a 50% reduction in headache frequency and intensity    Time 3    Period Weeks    Status New    Target Date 01/23/20             PT Long Term Goals - 01/02/20 1317      PT LONG TERM GOAL #1   Title Patient will report a 75% reduction in headache frequncy and intesity in order to work    Time 6    Period Weeks    Status New    Target Date 02/13/20      PT LONG TERM GOAL #2   Title Patient will use her arms at work without increased headaches or pain in ipper traps and arms    Time 6    Period Weeks    Status New    Target Date 02/13/20                 Plan - 01/23/20 1717    Clinical Impression Statement pt continues to report a redcution in migraine frequency. continued TPDN for bil upper trap combined with E-stim and followed with rib and cervicothoracic mobs. worked on efficient posture and  posterior  shoulder strengthening which she responded well to. end of session she denied pain or tension.    PT Treatment/Interventions Spinal Manipulations;Joint Manipulations;Taping;Dry needling;Passive range of motion;Manual techniques;Patient/family education;Therapeutic exercise;Neuromuscular re-education;Therapeutic activities;Functional mobility training;Traction;Moist Heat;Iontophoresis 4mg /ml Dexamethasone;ADLs/Self Care Home Management;Cryotherapy;Electrical Stimulation    PT Next Visit Plan Assess HEP and progress PRN, response to DN with e-stom and manual for left upper trap/levator/suboccipitals, postural strengthening    PT Home Exercise Plan [id number], postureal education    Consulted and Agree with Plan of Care Patient           Patient will benefit from skilled therapeutic intervention in order to improve the following deficits and impairments:  Pain,Postural dysfunction,Impaired flexibility,Increased fascial restricitons,Decreased strength,Decreased activity tolerance,Improper body mechanics,Impaired perceived functional ability,Hypomobility  Visit Diagnosis: Fusion of spine, cervical region  Abnormal posture  Muscle weakness (generalized)     Problem List Patient Active Problem List   Diagnosis Date Noted  . Vitamin B12 deficiency 01/02/2020  . Vitamin D deficiency 12/31/2019  . Elevated TSH 12/31/2019  . Muscle pain 12/19/2019  . Anxiety 12/19/2019  . Radiculopathy 04/26/2019  . Cervical myelopathy ([place]) 03/07/2019  . Migraine 02/19/2019  . Shoulder pain, left 12/07/2018  . Iron deficiency anemia 11/07/2018  . Elevated blood pressure reading 11/07/2018  . Preventative health care 11/07/2018  . Postoperative state 04/16/2013  . Hematometra 01/09/2013  . Menorrhagia 01/09/2013  . Chronic pelvic pain in female 01/09/2013   Starr Lake PT, DPT, LAT, ATC  01/23/20  5:21 PM       [facility] [address] [place], [place], [address] Phone: [phone]   Fax:  [phone]  Name: [name] MRN: [id number] Date of Birth: 1970-05-13

## 2020-01-23 NOTE — Patient Instructions (Signed)

## 2020-01-24 ENCOUNTER — Other Ambulatory Visit: Payer: Self-pay | Admitting: Family Medicine

## 2020-01-29 ENCOUNTER — Ambulatory Visit (INDEPENDENT_AMBULATORY_CARE_PROVIDER_SITE_OTHER): Payer: Medicaid Other | Admitting: *Deleted

## 2020-01-29 ENCOUNTER — Other Ambulatory Visit: Payer: Self-pay

## 2020-01-29 DIAGNOSIS — E538 Deficiency of other specified B group vitamins: Secondary | ICD-10-CM

## 2020-02-07 ENCOUNTER — Ambulatory Visit (INDEPENDENT_AMBULATORY_CARE_PROVIDER_SITE_OTHER): Payer: Medicaid Other | Admitting: *Deleted

## 2020-02-07 DIAGNOSIS — E538 Deficiency of other specified B group vitamins: Secondary | ICD-10-CM | POA: Diagnosis not present

## 2020-02-07 NOTE — Progress Notes (Signed)
B12 injection given in clinic today.

## 2020-02-12 ENCOUNTER — Encounter: Payer: Self-pay | Admitting: Physical Therapy

## 2020-02-12 ENCOUNTER — Other Ambulatory Visit: Payer: Self-pay

## 2020-02-12 ENCOUNTER — Ambulatory Visit: Payer: Medicaid Other | Admitting: Physical Therapy

## 2020-02-12 DIAGNOSIS — M6281 Muscle weakness (generalized): Secondary | ICD-10-CM

## 2020-02-12 DIAGNOSIS — M4322 Fusion of spine, cervical region: Secondary | ICD-10-CM

## 2020-02-12 DIAGNOSIS — R293 Abnormal posture: Secondary | ICD-10-CM

## 2020-02-13 NOTE — Addendum Note (Signed)
Addended by: Carney Living on: 02/13/2020 10:19 AM   Modules accepted: Orders

## 2020-02-13 NOTE — Therapy (Addendum)
[place], [place], [address] Phone: [phone]   Fax:  [phone]  Physical Therapy Treatment  Patient Details  Name: [name] MRN: [id number] Date of Birth: 12-10-1970 Referring Provider (PT): Dr Phylliss Bob   Encounter Date: 02/12/2020   PT End of Session - 02/12/20 1402    Visit Number 4    Number of Visits 8    Date for PT Re-Evaluation 03/18/20    PT Start Time 1330    PT Stop Time 1413    PT Time Calculation (min) 43 min    Activity Tolerance Patient tolerated treatment well    Behavior During Therapy [facility] for tasks assessed/performed           Past Medical History:  Diagnosis Date  . Anemia   . Fibroids, submucosal   . History of blood transfusion 02/2011   [facility] - ? 2-3 units transfused    Past Surgical History:  Procedure Laterality Date  . ANTERIOR CERVICAL DECOMPRESSION/DISCECTOMY FUSION 4 LEVELS N/A 04/26/2019   Procedure: ANTERIOR CERVICAL DECOMPRESSION FUSION CERVICAL 4-5, CERVICAL 5-6, CERVICAL 6-7 WITH INSTRUMENTATION AND ALLOGRAFT;  Surgeon: Phylliss Bob, MD;  Location: [place];  Service: Orthopedics;  Laterality: N/A;  . APPENDECTOMY    . BILATERAL SALPINGECTOMY Bilateral 04/16/2013   Procedure: BILATERAL SALPINGECTOMY;  Surgeon: Lavonia Drafts, MD;  Location: [place] [facility];  Service: Gynecology;  Laterality: Bilateral;  . [place], 2002  . DILATION AND CURETTAGE OF UTERUS N/A 03/31/2012   Procedure: DILATATION AND CURETTAGE;  Surgeon: Emily Filbert, MD;  Location: [facility];  Service: Gynecology;  Laterality: N/A;  . LAPAROSCOPIC LYSIS OF ADHESIONS N/A 03/31/2012   Procedure: LAPAROSCOPIC LYSIS OF ADHESIONS;  Surgeon: Emily Filbert, MD;  Location: [facility];  Service: Gynecology;  Laterality: N/A;  . LAPAROSCOPY N/A 03/31/2012   Procedure: LAPAROSCOPY OPERATIVE;  Surgeon: Emily Filbert, MD;  Location: [facility] [facility];  Service: Gynecology;  Laterality: N/A;  . NOVASURE ABLATION  N/A 03/31/2012   Procedure: NOVASURE ABLATION;  Surgeon: Emily Filbert, MD;  Location: [facility];  Service: Gynecology;  Laterality: N/A;  . ROBOTIC ASSISTED TOTAL HYSTERECTOMY N/A 04/16/2013   Procedure: ROBOTIC ASSISTED TOTAL HYSTERECTOMY;  Surgeon: Lavonia Drafts, MD;  Location: [facility];  Service: Gynecology;  Laterality: N/A;  . TUBAL LIGATION  2002  . WISDOM TOOTH EXTRACTION      There were no vitals filed for this visit.   Subjective Assessment - 02/12/20 1333    Subjective Patient has been having more pain in her left shoulder recently. It has been going on for a few weeks now.    Pertinent History Hysterectomy, 2 cesareans, appendectomy    Limitations Sitting;Lifting;House hold activities    Patient Stated Goals Be able to take control of the pain and continue to heal to 100%    Currently in Pain? Yes    Pain Score 6     Pain Location Neck    Pain Orientation Right;Left    Pain Descriptors / Indicators Aching    Pain Type Chronic pain    Pain Onset More than a month ago    Pain Frequency Constant    Aggravating Factors  use of her left arm    Pain Relieving Factors ice    Effect of Pain on Daily Activities difficulty using her left arm              [facility] PT Assessment - 02/13/20 0001  AROM   Cervical Flexion 44    Cervical Extension 46    Cervical - Right Rotation 70    Cervical - Left Rotation 56      Strength   Right Shoulder Flexion 4+/5    Left Shoulder Flexion 4/5    Left Shoulder Extension 5/5    Left Shoulder Internal Rotation 5/5    Left Shoulder External Rotation 5/5                         [facility] Adult PT Treatment/Exercise - 02/13/20 0001      Neck Exercises: Seated   Other Seated Exercise bilateral ERred 10; horizontal abduction 10; reviewed for HEP    Other Seated Exercise UBE 3 min posterior       Manual Therapy   Soft tissue mobilization Left upper trap/levator scap and bilateral cervical paraspinals using IASTYM      Myofascial Release Suboccipital release    Manual Traction With suboccipital release      Neck Exercises: Stretches   Upper Trapezius Stretch 30 seconds;1 rep;Right;Left    Levator Stretch 1 rep;Left;Right;30 seconds            Trigger Point Dry Needling - 02/13/20 0001    E-stim with Dry Needling Details 2 spots in left UT and left Levator using a .30x50 needle    Upper Trapezius Response Twitch reponse elicited;Palpable increased muscle length    Levator Scapulae Response Twitch response elicited                PT Education - 02/12/20 1401    Education Details HEP and symtem mangement    Person(s) Educated Patient    Methods Explanation;Demonstration;Verbal cues;Tactile cues    Comprehension Verbal cues required;Returned demonstration;Verbalized understanding;Tactile cues required            PT Short Term Goals - 02/13/20 1004      PT SHORT TERM GOAL #1   Title Patient will increase bilateral shoulder flexion strength to 4+/5    Baseline left shoulder still limited    Time 3    Period Weeks    Status On-going      PT SHORT TERM GOAL #2   Title Pt will demonstrates pain free B cervical rotation to >/= 65 degrees.    Baseline right rotation sitll limited    Time 3    Period Weeks    Status On-going      PT SHORT TERM GOAL #3   Title Patient will report a 50% reduction in headache frequency and intensity    Baseline no headaches    Time 3    Period Weeks    Status Achieved             PT Long Term Goals - 02/13/20 1005      PT LONG TERM GOAL #1   Title Patient will report a 75% reduction in headache frequncy and intesity in order to work    Baseline no headaches    Time 6    Period Weeks    Status Achieved      PT LONG TERM GOAL #2   Title Patient will use her arms at work without increased headaches or pain in ipper traps and arms    Baseline imprpivng motion in all planes    Time 6    Period Weeks    Status On-going      PT LONG TERM GOAL  #3  Title Pt will have no occurrence of headaches/ability to prevent onset of HA with HEP performance.    Baseline working on her exercises    Time 6    Period Weeks    Status On-going      PT LONG TERM GOAL #4   Title Pt will return to work full time with no c/o limitation from pain.    Baseline mild pain at work    Time 8    Period Weeks    Status On-going                 Plan - 02/12/20 1403    Clinical Impression Statement Patients headaches have resolved. She has intemrittent headaches from time to time but they are much better. At this time she is having tightness in her left shoulder and pain into her left upper trap. Hier pain is worse with acrtivity.  Her cervical motion has improved to the left but is still limited to the right. She also continues to have wekaness in left shoulder flexion. Her right has improved significantly. She would benefit from skilled therapy 1W6 to continue to work on functional strengthening of the left UE.    Comorbidities Anemia    Examination-Activity Limitations Sit;Sleep;Carry;Lift    Examination-Participation Restrictions Driving;Community Activity;Shop;Cleaning;Laundry    Stability/Clinical Decision Making Stable/Uncomplicated    Clinical Decision Making Low    Rehab Potential Good    PT Frequency 1x / week    PT Duration 8 weeks    PT Treatment/Interventions Spinal Manipulations;Joint Manipulations;Taping;Dry needling;Passive range of motion;Manual techniques;Patient/family education;Therapeutic exercise;Neuromuscular re-education;Therapeutic activities;Functional mobility training;Traction;Moist Heat;Iontophoresis 4mg /ml Dexamethasone;ADLs/Self Care Home Management;Cryotherapy;Electrical Stimulation    PT Next Visit Plan Assess HEP and progress PRN, response to DN with e-stom and manual for left upper trap/levator/suboccipitals, postural strengthening    PT Home Exercise Plan [id number], postureal education    Consulted and Agree with  Plan of Care Patient           Patient will benefit from skilled therapeutic intervention in order to improve the following deficits and impairments:  Pain,Postural dysfunction,Impaired flexibility,Increased fascial restricitons,Decreased strength,Decreased activity tolerance,Improper body mechanics,Impaired perceived functional ability,Hypomobility  Visit Diagnosis: Fusion of spine, cervical region  Abnormal posture  Muscle weakness (generalized)     Problem List Patient Active Problem List   Diagnosis Date Noted  . Vitamin B12 deficiency 01/02/2020  . Vitamin D deficiency 12/31/2019  . Elevated TSH 12/31/2019  . Muscle pain 12/19/2019  . Anxiety 12/19/2019  . Radiculopathy 04/26/2019  . Cervical myelopathy ([place]) 03/07/2019  . Migraine 02/19/2019  . Shoulder pain, left 12/07/2018  . Iron deficiency anemia 11/07/2018  . Elevated blood pressure reading 11/07/2018  . Preventative health care 11/07/2018  . Postoperative state 04/16/2013  . Hematometra 01/09/2013  . Menorrhagia 01/09/2013  . Chronic pelvic pain in female 01/09/2013    Carney Living PT DPT  02/13/2020, 10:11 AM  [place], [place], [address] Phone: [phone]   Fax:  [phone]  Name: DIERRA RIESGO MRN: [id number] Date of Birth: Jun 15, 1970

## 2020-02-14 ENCOUNTER — Other Ambulatory Visit: Payer: Self-pay

## 2020-02-14 ENCOUNTER — Ambulatory Visit (INDEPENDENT_AMBULATORY_CARE_PROVIDER_SITE_OTHER): Payer: Medicaid Other | Admitting: Student

## 2020-02-14 DIAGNOSIS — B309 Viral conjunctivitis, unspecified: Secondary | ICD-10-CM | POA: Diagnosis not present

## 2020-02-14 DIAGNOSIS — E538 Deficiency of other specified B group vitamins: Secondary | ICD-10-CM

## 2020-02-14 NOTE — Patient Instructions (Addendum)
It was a pleasure seeing you in clinic. Today we discussed:   Pink eye: This is likely due to a virus. You should stay out of work until drainage stops. I have written you a work note for this. If drainage has stopped sooner give [place] a call and we can write you a new note. You can try Ketofen drops or lubricating eyedrops to help with symptoms.   Conjuntivitis viral en los adultos Viral Conjunctivitis, Adult  La conjuntivitis viral es la inflamacin de la membrana transparente que cubre la parte blanca del ojo y la superficie interna del prpado (conjuntiva). La inflamacin es causada por una infeccin viral. Los vasos sanguneos de la conjuntiva se agrandan, el ojo se vuelve de color rojo o rosado, y a Proofreader. Por lo general, comienza en un ojo y se extiende al otro tras [employer]. Las infecciones suelen resolverse en el trmino de 1 a 2 semanas. La conjuntivitis viral es contagiosa. Esto significa que puede transmitirse fcilmente de Ardelia Mems persona a Theatre manager. A menudo, a esta afeccin se la denomina ojo rosado. Cules son las causas? La causa de esta afeccin es un virus. Puede propagarse al tocar [employer] contaminados con el virus, como las manijas de las puertas o las [place], y luego tocarse el ojo. Tambin se puede transmitir a travs de [employer], al toser o Brewing technologist. Qu incrementa el riesgo? Es ms probable que presente esta afeccin si tiene un resfro o gripe, o si est en contacto directo con una persona que tiene conjuntivitis. Cules son los signos o sntomas? Los sntomas de esta afeccin incluyen:  Enrojecimiento en el ojo.  Lagrimeo u ojos llorosos.  Irritacin y [employer] ojos.  Sensacin de ardor en los ojos.  Secrecin transparente de los ojos.  Hinchazn de los prpados.  Sensacin de [employer].  Sensibilidad a Naval architect. Esta afeccin suele estar acompaada de otros sntomas, como congestin nasal, tos y [place]. Cmo se  diagnostica? Esta afeccin se diagnostica mediante una revisin de los antecedentes mdicos y un examen fsico. Si tiene secrecin en el ojo, esta se puede analizar para descartar otras causas de conjuntivitis. Cmo se trata? La conjuntivitis viral no responde a los medicamentos que eliminan bacterias (antibiticos). Por lo general, la afeccin desaparece sin tratamiento en el transcurso de 1a2semanas. Si se necesita tratamiento, este apunta a [employer] sntomas y prevenir la propagacin de la infeccin. Este se puede Optometrist con lgrimas artificiales en gotas, antihistamnicos en gotas u otros medicamentos para los ojos. En contadas ocasiones, se pueden recetar gotas con corticoesteroides o antivirales para el herpes. Siga estas instrucciones en su casa: Medicamentos  Tome o aplquese los medicamentos de venta libre y los recetados solamente como se lo haya indicado el mdico.  No toque el borde del prpado con el frasco de las gotas oftlmicas ni con el tubo de la pomada cuando aplique los medicamentos en el ojo afectado. Esto evitar que la infeccin se propague al otro ojo o a Producer, television/film/video.   Cuidado de los ojos  Evite tocarse o frotarse los ojos.  Aplquese un pao limpio, fro y hmedo en el ojo durante 10 a 54mnutos, 3 o 4veces al da o como se lo haya indicado el mdico.  Si u[place]adalentes de [place] no los use hasta que haya desaparecido la inflamacin y su mdico le indique que es seguro usarlos nuevamente. Pregunte a su mdico cmo desinfectar o reemplazar sus lentes de contacto  antes de usarlos nuevamente. Use anteojos hasta que pueda volver a usar los lentes de [place].  Evite usar [employer] ojos hasta que la inflamacin se haya ido. Descarte cosmticos viejos para los ojos que puedan estar contaminados.  Si se formaron costras en el ojo, retrelas suavemente con un pao hmedo o con una torunda de algodn. Instrucciones generales  Cambie o lave la funda de la  almohada todos los das o como se lo haya indicado el mdico.  No comparta toallas, fundas de almohadas, toallitas para la cara, maquillaje para los ojos, brochas de [place], lentes de contacto ni anteojos. Esto puede propagar la infeccin.  Lvese las manos frecuentemente con agua y [place]. Use toallas de papel para secarse las manos. Use desinfectante para manos si no dispone de [place] y [place].  Evite el contacto con otras personas hasta que el ojo ya no est rojo y con [place], o como se lo haya indicado el mdico. Comunquese con un mdico si:  Los sntomas empeoran o no mejoran con Dispensing optician.  Siente un dolor cada vez ms intenso.  La visin se vuelve borrosa.  Tiene fiebre.  Siente dolor u observa hinchazn o enrojecimiento en la cara.  Le sale una secrecin amarilla o verde del ojo.  Aparecen nuevos sntomas. Solicite ayuda de inmediato si:  Siente dolor intenso.  La visin [employer]. Resumen  La conjuntivitis viral es la inflamacin de la membrana transparente que cubre la parte blanca del ojo y la superficie interna del prpado. Suele desaparecer en el trmino de 1 a 2semanas.  Por lo general, esta afeccin se trata con medicamentos y compresas fras. El tratamiento se centra en el alivio de los sntomas.  Esta afeccin es muy contagiosa. Para prevenir una infeccin, evite el contacto cercano con otras personas, lvese las manos con frecuencia y no comparta toallas ni toallitas para la cara.Solicite ayuda de inmediato si tiene dolor intenso o si la visin [employer]. Esta informacin no tiene Marine scientist el consejo del mdico. Asegrese de hacerle al mdico cualquier pregunta que tenga. Document Revised: 02/09/2019 Document Reviewed: 02/09/2019 Elsevier Patient Education  2021 [employer].

## 2020-02-14 NOTE — Assessment & Plan Note (Addendum)
[name] presents for redness of the right eye for the past two days. States she first noted her right eye was red at bedtime with a small amount of watery discharge at bedtime. That day she recalls going to the pet groomer and PT but does not recall getting anything in her eyes or discomfort during the day. Yesterday went to work and was sent home due to this. 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 Continued to have small amount of intermittent watery drainage from the right eye. Today also noticed redness starting to form in the left eye. Does not wear contacts, denies visual disturbance, pain with eye movement, photophobia, fever, chills, cough, nausea, or vomiting. On exam patient with diffuse conjunctival injection of the right eye, without purulence, pupils are reactive to light and visual acuity is grossly intact. Likely this is viral conjunctivitis discussed that this is very contagious and she should stay home from work until she no longer is having drainage from her eyes especially as she works as a Careers adviser. 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

## 2020-02-14 NOTE — Progress Notes (Signed)
   CC: red eye  HPI:  [name] is a 50 y.o. female with history listed below presents for redness of the right eye for the past 2 days. Please refer to problem based charting for further details and assessment and plan of current problem and chronic medical conditions.   Past Medical History:  Diagnosis Date  . Anemia   . Fibroids, submucosal   . History of blood transfusion 02/2011   [facility] - ? 2-3 units transfused   Review of Systems:  Negative as per HPI  Physical Exam:  Vitals:   02/14/20 0838  BP: 126/83  Pulse: 63  Temp: 98 F (36.7 C)  TempSrc: Oral  SpO2: 100%  Weight: 247 lb 8 oz (112.3 kg)  Height: 5\' 6"  (1.676 m)   Physical Exam Constitutional:      Appearance: Normal appearance.  HENT:     Head: Normocephalic and atraumatic.     Nose: Nose normal. No congestion or rhinorrhea.     Mouth/Throat:     Mouth: Mucous membranes are moist.     Pharynx: Oropharynx is clear.  Eyes:     General: Vision grossly intact.        Right eye: No foreign body.     Extraocular Movements: Extraocular movements intact.     Conjunctiva/sclera:     Right eye: Right conjunctiva is injected. No exudate or hemorrhage.    Left eye: Left conjunctiva is injected (mild).     Pupils: Pupils are equal, round, and reactive to light.  Cardiovascular:     Rate and Rhythm: Normal rate and regular rhythm.     Heart sounds: Normal heart sounds.  Pulmonary:     Effort: Pulmonary effort is normal.     Breath sounds: Normal breath sounds.  Abdominal:     General: Abdomen is flat. Bowel sounds are normal.     Palpations: Abdomen is soft.  Musculoskeletal:        General: Normal range of motion.  Skin:    General: Skin is warm and dry.     Capillary Refill: Capillary refill takes less than 2 seconds.  Neurological:     General: No focal deficit present.     Mental Status: She is alert. Mental status is at baseline.     Assessment & Plan:   See Encounters Tab for problem  based charting.  Patient discussed with Dr. Daryll Drown.

## 2020-02-18 ENCOUNTER — Other Ambulatory Visit: Payer: Self-pay

## 2020-02-18 ENCOUNTER — Ambulatory Visit: Payer: Medicaid Other | Admitting: Physical Therapy

## 2020-02-18 ENCOUNTER — Encounter: Payer: Self-pay | Admitting: Physical Therapy

## 2020-02-18 DIAGNOSIS — M6281 Muscle weakness (generalized): Secondary | ICD-10-CM

## 2020-02-18 DIAGNOSIS — M4322 Fusion of spine, cervical region: Secondary | ICD-10-CM | POA: Diagnosis not present

## 2020-02-18 DIAGNOSIS — R293 Abnormal posture: Secondary | ICD-10-CM

## 2020-02-18 NOTE — Therapy (Signed)
[place], [place], [address] Phone: [phone]   Fax:  [phone]  Physical Therapy Treatment  Patient Details  Name: [name] MRN: [id number] Date of Birth: Jun 13, 1970 Referring Provider (PT): Dr Phylliss Bob   Encounter Date: 02/18/2020   PT End of Session - 02/18/20 1503    Visit Number 5    Number of Visits 8    Date for PT Re-Evaluation 03/18/20    Authorization Type [employer]    Authorization - Visit Number 4    Authorization - Number of Visits 27    PT Start Time 1500    PT Stop Time 1540    PT Time Calculation (min) 40 min    Activity Tolerance Patient tolerated treatment well    Behavior During Therapy [facility] for tasks assessed/performed           Past Medical History:  Diagnosis Date  . Anemia   . Fibroids, submucosal   . History of blood transfusion 02/2011   [facility] - ? 2-3 units transfused    Past Surgical History:  Procedure Laterality Date  . ANTERIOR CERVICAL DECOMPRESSION/DISCECTOMY FUSION 4 LEVELS N/A 04/26/2019   Procedure: ANTERIOR CERVICAL DECOMPRESSION FUSION CERVICAL 4-5, CERVICAL 5-6, CERVICAL 6-7 WITH INSTRUMENTATION AND ALLOGRAFT;  Surgeon: Phylliss Bob, MD;  Location: [place];  Service: Orthopedics;  Laterality: N/A;  . APPENDECTOMY    . BILATERAL SALPINGECTOMY Bilateral 04/16/2013   Procedure: BILATERAL SALPINGECTOMY;  Surgeon: Lavonia Drafts, MD;  Location: [place];  Service: Gynecology;  Laterality: Bilateral;  . [place], 2002  . DILATION AND CURETTAGE OF UTERUS N/A 03/31/2012   Procedure: DILATATION AND CURETTAGE;  Surgeon: Emily Filbert, MD;  Location: [place] [facility];  Service: Gynecology;  Laterality: N/A;  . LAPAROSCOPIC LYSIS OF ADHESIONS N/A 03/31/2012   Procedure: LAPAROSCOPIC LYSIS OF ADHESIONS;  Surgeon: Emily Filbert, MD;  Location: [facility];  Service: Gynecology;  Laterality: N/A;  . LAPAROSCOPY N/A 03/31/2012   Procedure: LAPAROSCOPY OPERATIVE;   Surgeon: Emily Filbert, MD;  Location: [place];  Service: Gynecology;  Laterality: N/A;  . NOVASURE ABLATION N/A 03/31/2012   Procedure: NOVASURE ABLATION;  Surgeon: Emily Filbert, MD;  Location: [facility];  Service: Gynecology;  Laterality: N/A;  . ROBOTIC ASSISTED TOTAL HYSTERECTOMY N/A 04/16/2013   Procedure: ROBOTIC ASSISTED TOTAL HYSTERECTOMY;  Surgeon: Lavonia Drafts, MD;  Location: [facility];  Service: Gynecology;  Laterality: N/A;  . TUBAL LIGATION  2002  . WISDOM TOOTH EXTRACTION      There were no vitals filed for this visit.   Subjective Assessment - 02/18/20 1503    Subjective "Last week it was pretty rough and achey still. I am not as stiff as I was last session.    Currently in Pain? Yes    Pain Score 5     Pain Location Shoulder    Pain Orientation Left    Pain Descriptors / Indicators Aching    Pain Type Chronic pain    Pain Frequency Intermittent              [facility] PT Assessment - 02/18/20 0001      Assessment   Medical Diagnosis Headaches    Referring Provider (PT) Dr Phylliss Bob                         [facility] Adult PT Treatment/Exercise - 02/18/20 0001      Neck Exercises: Machines  for Strengthening   UBE (Upper Arm Bike) L5 x 4 min   fwd/bwd x 2 min ea.     Neck Exercises: Theraband   Shoulder Extension 10 reps;Green   x 2 sets   Rows 10 reps;Green   x 2 sets     Neck Exercises: Standing   Wall Push Ups 10 reps   x2 with plus only   Other Standing Exercises scaption 2 x 10 1#    Other Standing Exercises shoulder wall walks 1 x 5 flexion/ abduction      Manual Therapy   Manual therapy comments self trigger point release      Neck Exercises: Stretches   Upper Trapezius Stretch 1 rep;Left;30 seconds    Levator Stretch 1 rep;30 seconds;Left                  PT Education - 02/18/20 1511    Education Details self trigger point release and how to perform at home    Person(s) Educated Patient    Methods Explanation;Verbal cues     Comprehension Verbalized understanding;Verbal cues required            PT Short Term Goals - 02/13/20 1004      PT SHORT TERM GOAL #1   Title Patient will increase bilateral shoulder flexion strength to 4+/5    Baseline left shoulder still limited    Time 3    Period Weeks    Status On-going      PT SHORT TERM GOAL #2   Title Pt will demonstrates pain free B cervical rotation to >/= 65 degrees.    Baseline right rotation sitll limited    Time 3    Period Weeks    Status On-going      PT SHORT TERM GOAL #3   Title Patient will report a 50% reduction in headache frequency and intensity    Baseline no headaches    Time 3    Period Weeks    Status Achieved             PT Long Term Goals - 02/13/20 1005      PT LONG TERM GOAL #1   Title Patient will report a 75% reduction in headache frequncy and intesity in order to work    Baseline no headaches    Time 6    Period Weeks    Status Achieved      PT LONG TERM GOAL #2   Title Patient will use her arms at work without increased headaches or pain in ipper traps and arms    Baseline imprpivng motion in all planes    Time 6    Period Weeks    Status On-going      PT LONG TERM GOAL #3   Title Pt will have no occurrence of headaches/ability to prevent onset of HA with HEP performance.    Baseline working on her exercises    Time 6    Period Weeks    Status On-going      PT LONG TERM GOAL #4   Title Pt will return to work full time with no c/o limitation from pain.    Baseline mild pain at work    Time 8    Period Weeks    Status On-going                 Plan - 02/18/20 1540    Clinical Impression Statement pt continues to report the HA have  resolved and only are intermittent. educated on self trigger point release techniques and reviewed benefits as it compares to [facility]. focused session primarily on shoulder strengthening which she did well with. she reported no pain during or following but did report  she was going to be sore.    PT Treatment/Interventions Spinal Manipulations;Joint Manipulations;Taping;Dry needling;Passive range of motion;Manual techniques;Patient/family education;Therapeutic exercise;Neuromuscular re-education;Therapeutic activities;Functional mobility training;Traction;Moist Heat;Iontophoresis 4mg /ml Dexamethasone;ADLs/Self Care Home Management;Cryotherapy;Electrical Stimulation    PT Next Visit Plan Assess HEP and progress PRN, response to DN with e-stom and manual for left upper trap/levator/suboccipitals, postural strengthening    PT Home Exercise Plan [id number], postureal education    Consulted and Agree with Plan of Care Patient           Patient will benefit from skilled therapeutic intervention in order to improve the following deficits and impairments:  Pain,Postural dysfunction,Impaired flexibility,Increased fascial restricitons,Decreased strength,Decreased activity tolerance,Improper body mechanics,Impaired perceived functional ability,Hypomobility  Visit Diagnosis: Abnormal posture  Muscle weakness (generalized)  Fusion of spine, cervical region     Problem List Patient Active Problem List   Diagnosis Date Noted  . Viral conjunctivitis 02/14/2020  . Vitamin B12 deficiency 01/02/2020  . Vitamin D deficiency 12/31/2019  . Elevated TSH 12/31/2019  . Muscle pain 12/19/2019  . Anxiety 12/19/2019  . Radiculopathy 04/26/2019  . Cervical myelopathy ([place]) 03/07/2019  . Migraine 02/19/2019  . Shoulder pain, left 12/07/2018  . Iron deficiency anemia 11/07/2018  . Elevated blood pressure reading 11/07/2018  . Preventative health care 11/07/2018  . Postoperative state 04/16/2013  . Hematometra 01/09/2013  . Menorrhagia 01/09/2013  . Chronic pelvic pain in female 01/09/2013   Starr Lake PT, DPT, LAT, ATC  02/18/20  3:42 PM      [facility] [address] [place], [place], [address] Phone:  [phone]   Fax:  [phone]  Name: [name] MRN: [id number] Date of Birth: September 22, 1970

## 2020-02-19 NOTE — Progress Notes (Signed)
Internal Medicine Clinic Attending ? ?Case discussed with Dr. Liang  At the time of the visit.  We reviewed the resident?s history and exam and pertinent patient test results.  I agree with the assessment, diagnosis, and plan of care documented in the resident?s note. ? ?

## 2020-02-21 ENCOUNTER — Other Ambulatory Visit: Payer: Self-pay

## 2020-02-21 ENCOUNTER — Ambulatory Visit (INDEPENDENT_AMBULATORY_CARE_PROVIDER_SITE_OTHER): Payer: Medicaid Other | Admitting: *Deleted

## 2020-02-21 DIAGNOSIS — E538 Deficiency of other specified B group vitamins: Secondary | ICD-10-CM

## 2020-02-22 ENCOUNTER — Other Ambulatory Visit: Payer: Self-pay

## 2020-02-22 ENCOUNTER — Encounter (HOSPITAL_COMMUNITY): Payer: Self-pay | Admitting: Emergency Medicine

## 2020-02-22 ENCOUNTER — Ambulatory Visit (HOSPITAL_COMMUNITY)
Admission: EM | Admit: 2020-02-22 | Discharge: 2020-02-22 | Disposition: A | Discharge status: Home / Self Care | Payer: Medicaid Other | Attending: Emergency Medicine | Admitting: Emergency Medicine

## 2020-02-22 ENCOUNTER — Telehealth: Payer: Self-pay

## 2020-02-22 DIAGNOSIS — M545 Low back pain, unspecified: Secondary | ICD-10-CM | POA: Diagnosis not present

## 2020-02-22 MED ORDER — KETOROLAC TROMETHAMINE 30 MG/ML IJ SOLN
30.0000 mg | Freq: Once | INTRAMUSCULAR | Status: AC
  Administered 2020-02-22: 30 mg via INTRAMUSCULAR

## 2020-02-22 MED ORDER — TIZANIDINE HCL 2 MG PO TABS: 2.0000 mg | ORAL_TABLET | Freq: Every day | ORAL | 0 refills | Status: DC

## 2020-02-22 MED ORDER — KETOROLAC TROMETHAMINE 30 MG/ML IJ SOLN
INTRAMUSCULAR | Status: AC
  Filled 2020-02-22: qty 1

## 2020-02-22 MED ORDER — MELOXICAM 7.5 MG PO TABS: 7.5000 mg | ORAL_TABLET | Freq: Every day | ORAL | 0 refills | Status: DC

## 2020-02-22 NOTE — ED Triage Notes (Signed)
Pt presents with lower back pain on right side that radiates into hip xs 1 week.   States tylenol extra strength and heating pad only given minimal relief.

## 2020-02-22 NOTE — ED Provider Notes (Signed)
[place]    CSN: [id number] Arrival date & time: 02/22/20  0930      History   Chief Complaint Chief Complaint  Patient presents with  . Back Pain    HPI [name] is a 50 y.o. female.   [name] presents with complaints of right low back pain which started 1 week ago. No known trigger or injury. Has been waxing and waning. Pain into right buttocks. Worse with certain movements. No numbness tingling ,weakness, saddle symptoms or loss of bladder or bowel function. No urinary symptoms. No abdominal pain. Surgery on her neck/ upper back in the past. Has had some intermittent low back pain in the past. 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 Has been taking tylenol which has only minimally helped.     ROS per HPI, negative if not otherwise mentioned.      Past Medical History:  Diagnosis Date  . Anemia   . Fibroids, submucosal   . History of blood transfusion 02/2011   [facility] - ? 2-3 units transfused    Patient Active Problem List   Diagnosis Date Noted  . Viral conjunctivitis 02/14/2020  . Vitamin B12 deficiency 01/02/2020  . Vitamin D deficiency 12/31/2019  . Elevated TSH 12/31/2019  . Muscle pain 12/19/2019  . Anxiety 12/19/2019  . Radiculopathy 04/26/2019  . Cervical myelopathy ([place]) 03/07/2019  . Migraine 02/19/2019  . Shoulder pain, left 12/07/2018  . Iron deficiency anemia 11/07/2018  . Elevated blood pressure reading 11/07/2018  . Preventative health care 11/07/2018  . Postoperative state 04/16/2013  . Hematometra 01/09/2013  . Menorrhagia 01/09/2013  . Chronic pelvic pain in female 01/09/2013    Past Surgical History:  Procedure Laterality Date  . ANTERIOR CERVICAL DECOMPRESSION/DISCECTOMY FUSION 4 LEVELS N/A 04/26/2019   Procedure: ANTERIOR CERVICAL DECOMPRESSION FUSION CERVICAL 4-5, CERVICAL 5-6, CERVICAL 6-7 WITH INSTRUMENTATION AND ALLOGRAFT;  Surgeon: Phylliss Bob, MD;  Location: [place];  Service:  Orthopedics;  Laterality: N/A;  . APPENDECTOMY    . BILATERAL SALPINGECTOMY Bilateral 04/16/2013   Procedure: BILATERAL SALPINGECTOMY;  Surgeon: Lavonia Drafts, MD;  Location: [place] [facility];  Service: Gynecology;  Laterality: Bilateral;  . [place], 2002  . DILATION AND CURETTAGE OF UTERUS N/A 03/31/2012   Procedure: DILATATION AND CURETTAGE;  Surgeon: Emily Filbert, MD;  Location: [facility];  Service: Gynecology;  Laterality: N/A;  . LAPAROSCOPIC LYSIS OF ADHESIONS N/A 03/31/2012   Procedure: LAPAROSCOPIC LYSIS OF ADHESIONS;  Surgeon: Emily Filbert, MD;  Location: [facility] [facility];  Service: Gynecology;  Laterality: N/A;  . LAPAROSCOPY N/A 03/31/2012   Procedure: LAPAROSCOPY OPERATIVE;  Surgeon: Emily Filbert, MD;  Location: [place] [facility];  Service: Gynecology;  Laterality: N/A;  . NOVASURE ABLATION N/A 03/31/2012   Procedure: NOVASURE ABLATION;  Surgeon: Emily Filbert, MD;  Location: [place] [place] [facility];  Service: Gynecology;  Laterality: N/A;  . ROBOTIC ASSISTED TOTAL HYSTERECTOMY N/A 04/16/2013   Procedure: ROBOTIC ASSISTED TOTAL HYSTERECTOMY;  Surgeon: Lavonia Drafts, MD;  Location: [facility];  Service: Gynecology;  Laterality: N/A;  . TUBAL LIGATION  2002  . WISDOM TOOTH EXTRACTION      OB History    Gravida  3   Para  2   Term  2   Preterm      AB  1   Living  2     SAB  1   IAB      Ectopic  Multiple      Live Births               Home Medications    Prior to Admission medications   Medication Sig Start Date End Date Taking? Authorizing Provider  meloxicam (MOBIC) 7.5 MG tablet Take 1 tablet (7.5 mg total) by mouth daily. 02/22/20  Yes Jesseca Marsch, Lanelle Bal B, NP  tiZANidine (ZANAFLEX) 2 MG tablet Take 1-2 tablets (2-4 mg total) by mouth at bedtime. 02/22/20  Yes Augusto Gamble B, NP  acetaminophen (TYLENOL) 500 MG tablet Take 1,000 mg by mouth every 6 (six) hours as needed for moderate pain or headache.    [provider]  Acetaminophen-Caffeine 500-65 MG TABS Take 1 tablet by  mouth every 6 (six) hours as needed. 07/02/19   Maudie Mercury, MD  ferrous sulfate 325 (65 FE) MG tablet Take 1 tablet (325 mg total) by mouth every other day. 11/08/18 03/11/28  Marcelyn Bruins, MD  Vitamin D, Ergocalciferol, (DRISDOL) 1.25 MG (50000 UNIT) CAPS capsule Take 1 capsule (50,000 Units total) by mouth every 7 (seven) days. 12/25/19   LampteyMyrene Galas, MD  azelastine (ASTELIN) 0.1 % nasal spray Place 1 spray into both nostrils 2 (two) times daily. Use in each nostril as directed Patient not taking: Reported on 03/05/2019 12/01/17 12/24/19  Shella Maxim, NP  DULoxetine (CYMBALTA) 30 MG capsule Take 1 capsule (30 mg total) by mouth daily. 12/19/19 12/24/19  Seawell, Jaimie A, DO  gabapentin (NEURONTIN) 300 MG capsule TAKE 1 CAPSULE(300 MG) BY MOUTH TWICE DAILY Patient not taking: Reported on 08/14/2019 05/23/19 12/24/19  Maudie Mercury, MD    Family History Family History  Problem Relation Age of Onset  . Hypertension Mother   . Seizures Father   . Fibroids Sister     Social History Social History   Tobacco Use  . Smoking status: Former Smoker    Packs/day: 0.00    Types: Cigarettes    Quit date: 01/21/2011    Years since quitting: 9.0  . Smokeless tobacco: Never Used  Vaping Use  . Vaping Use: Never used  Substance Use Topics  . Alcohol use: Yes    Comment: social occasions  . Drug use: No     Allergies   Bee venom, Bactrim [sulfamethoxazole-trimethoprim], and Codeine   Review of Systems Review of Systems   Physical Exam Triage Vital Signs ED Triage Vitals  Enc Vitals Group     BP 02/22/20 1040 128/69     Pulse Rate 02/22/20 1040 70     Resp 02/22/20 1040 18     Temp 02/22/20 1040 98.7 F (37.1 C)     Temp Source 02/22/20 1040 Oral     SpO2 02/22/20 1040 100 %     Weight --      Height --      Head Circumference --      Peak Flow --      Pain Score 02/22/20 1038 8     Pain Loc --      Pain Edu? --      Excl. in [place]? --    No data  found.  Updated Vital Signs BP 128/69 (BP Location: Right Arm)   Pulse 70   Temp 98.7 F (37.1 C) (Oral)   Resp 18   LMP 03/31/2013 (Exact Date)   SpO2 100%    Physical Exam Constitutional:      General: She is not in acute distress.    Appearance: She is well-developed.  Cardiovascular:     Rate and Rhythm: Normal rate.  Pulmonary:     Effort: Pulmonary effort is normal.  Musculoskeletal:     Lumbar back: No lacerations, tenderness or bony tenderness. Normal range of motion.     Comments: strength equal bilaterally; gross sensation intact to lower extremities; back pain worse with right hip flexion; worse with trunk rotation such as reaching with right arm   Skin:    General: Skin is warm and dry.  Neurological:     Mental Status: She is alert and oriented to person, place, and time.      UC Treatments / Results  Labs (all labs ordered are listed, but only abnormal results are displayed) Labs Reviewed - No data to display  EKG   Radiology No results found.  Procedures Procedures (including critical care time)  Medications Ordered in UC Medications  ketorolac (TORADOL) 30 MG/ML injection 30 mg (has no administration in time range)    Initial Impression / Assessment and Plan / UC Course  I have reviewed the triage vital signs and the nursing notes.  Pertinent labs & imaging results that were available during my care of the patient were reviewed by me and considered in my medical decision making (see chart for details).     Low back pain 1 week. No red flag findings today with pain management and rehab discussed. Patient verbalized understanding and agreeable to plan.  Ambulatory out of clinic without difficulty.    Final Clinical Impressions(s) / UC Diagnoses   Final diagnoses:  Acute right-sided low back pain without sciatica     Discharge Instructions     Light and regular activity as tolerated.  See exercises provided.  Heat application while  active can help with muscle spasms.  Sleep with pillow under your knees.   Meloxicam daily. Don't take additional ibuprofen for aleve. Take with food.  Tizanidine as needed at night as a muscle relaxer at night.  Follow up with your primary care provider as needed if persistent pain.     ED Prescriptions    Medication Sig Dispense Auth. Provider   meloxicam (MOBIC) 7.5 MG tablet Take 1 tablet (7.5 mg total) by mouth daily. 30 tablet Augusto Gamble B, NP   tiZANidine (ZANAFLEX) 2 MG tablet Take 1-2 tablets (2-4 mg total) by mouth at bedtime. 20 tablet Zigmund Gottron, NP     PDMP not reviewed this encounter.   Zigmund Gottron, NP 02/22/20 1120

## 2020-02-22 NOTE — Discharge Instructions (Addendum)
Light and regular activity as tolerated.  See exercises provided.  Heat application while active can help with muscle spasms.  Sleep with pillow under your knees.   Meloxicam daily. Don't take additional ibuprofen for aleve. Take with food.  Tizanidine as needed at night as a muscle relaxer at night.  Follow up with your primary care provider as needed if persistent pain.

## 2020-02-22 NOTE — Telephone Encounter (Signed)
Received TC from patient who c/o lower to mid back pain X 1 week.  She states she was in the office yesterday and received a Vit B 12 injection and was told to call back today to see if any appt's were available.  No appts available today.  Appt made for Monday, 02/25/20 @ 2:15 w/ Dr. Wynetta Emery.  Pt informed if back pain worsens to present to urgent care for evaluation and she verbalized understanding. SChaplin, RN,BSN

## 2020-02-25 ENCOUNTER — Telehealth: Payer: Self-pay

## 2020-02-25 ENCOUNTER — Other Ambulatory Visit: Payer: Self-pay | Admitting: Orthopedic Surgery

## 2020-02-25 ENCOUNTER — Encounter: Payer: Self-pay | Admitting: Student

## 2020-02-25 ENCOUNTER — Ambulatory Visit (INDEPENDENT_AMBULATORY_CARE_PROVIDER_SITE_OTHER): Payer: Medicaid Other | Admitting: Student

## 2020-02-25 VITALS — BP 135/77 | HR 76 | Temp 98.6°F | Wt 251.4 lb

## 2020-02-25 DIAGNOSIS — D509 Iron deficiency anemia, unspecified: Secondary | ICD-10-CM

## 2020-02-25 DIAGNOSIS — D649 Anemia, unspecified: Secondary | ICD-10-CM

## 2020-02-25 DIAGNOSIS — E538 Deficiency of other specified B group vitamins: Secondary | ICD-10-CM | POA: Diagnosis not present

## 2020-02-25 DIAGNOSIS — M545 Low back pain, unspecified: Secondary | ICD-10-CM

## 2020-02-25 DIAGNOSIS — E559 Vitamin D deficiency, unspecified: Secondary | ICD-10-CM

## 2020-02-25 DIAGNOSIS — M542 Cervicalgia: Secondary | ICD-10-CM

## 2020-02-25 LAB — POCT URINALYSIS DIPSTICK
Bilirubin, UA: NEGATIVE
Glucose, UA: NEGATIVE
Ketones, UA: NEGATIVE
Leukocytes, UA: NEGATIVE
Nitrite, UA: NEGATIVE
Protein, UA: NEGATIVE
Spec Grav, UA: 1.025 (ref 1.010–1.025)
Urobilinogen, UA: 0.2 E.U./dL
pH, UA: 6 (ref 5.0–8.0)

## 2020-02-25 MED ORDER — FERROUS SULFATE 325 (65 FE) MG PO TABS: 325.0000 mg | ORAL_TABLET | ORAL | 1 refills | Status: DC

## 2020-02-25 MED ORDER — ACETAMINOPHEN 500 MG PO TABS: 1000.0000 mg | ORAL_TABLET | Freq: Four times a day (QID) | ORAL | 0 refills | Status: DC | PRN

## 2020-02-25 NOTE — Telephone Encounter (Signed)
Pls contact [phone] regarding note

## 2020-02-25 NOTE — Progress Notes (Signed)
   CC: Back pain  HPI:  [name] is a 50 y.o. with past medical history significant for migraines, cervical radiculopathy who presents to clinic for evaluation of back pain. Refer to problem list for charting of this encounter.  Past Medical History:  Diagnosis Date  . Anemia   . Fibroids, submucosal   . History of blood transfusion 02/2011   [facility] - ? 2-3 units transfused   Review of Systems:  Endorses lower right back pain. Denies dysuria, increased urinary frequency/urgency, fevers, chills, abdominal pain, nausea, vomiting, radiation to the legs.  Physical Exam:  Vitals:   02/25/20 1414  BP: 135/77  Pulse: 76  Temp: 98.6 F (37 C)  TempSrc: Oral  SpO2: 98%  Weight: 251 lb 6.4 oz (114 kg)   Physical Exam Constitutional:      General: She is not in acute distress.    Appearance: Normal appearance.  Musculoskeletal:        General: Tenderness present. No swelling, deformity or signs of injury. Normal range of motion.     Comments: No swelling, deformity or injury of the lower back. Tenderness to palpation of the right lumbar paraspinal musculature. Full ROM with flexion, extension and rotation of the lumbar spine, however patient endorsed pain with rotation to the left.  Skin:    Comments: No overlying erythema, lesion, bruising of the right lower back  Neurological:     General: No focal deficit present.     Mental Status: She is alert. Mental status is at baseline.  Psychiatric:        Mood and Affect: Mood normal.        Behavior: Behavior normal.    Assessment & Plan:   See Encounters Tab for problem based charting.  Patient discussed with Dr. Jimmye Norman

## 2020-02-25 NOTE — Assessment & Plan Note (Signed)
-  Continue with weekly vitamin B12 injections -Plan to recheck vitamin B12 level following completion of injection series

## 2020-02-25 NOTE — Telephone Encounter (Signed)
Called pt - stated she talked to someone and has an appt for today @ 1415PM.

## 2020-02-25 NOTE — Patient Instructions (Addendum)
[name],  It was a pleasure meeting you in clinic today.  For your back pain: Continue with the heating pads and placing a pillow between your knees while sleeping. If your pain is mild, continue taking over-the-counter medications like acetaminophen (Tylenol) and ibuprofen (advil). If your pain worsens, consider taking the tizanidine which was prescribed in the ED. We will also obtain a urinalysis to rule out an associate urinary component, however I am doubtful that you have a urinary tract infection. You are stable and safe to return to work with these symptoms.  For your low vitamin D: Continue taking the oral vitamin D supplementation once weekly. You will need to transition to a lower dose daily regimen of this medication once you're done with the high-dose regimen.  For your low vitamin B12: Continue receiving the weekly vitamin B12 injections. We will need to recheck your vitamin B12 level once you are done with these injections.  Sincerely, Dr. Paulla Dolly, MD

## 2020-02-25 NOTE — Assessment & Plan Note (Signed)
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 Patient describes the pain as an "achy," constant, nonradiating, mild sensation and not associated with additional symptoms. She states that the pain worsens with rotation of the trunk and improves with acetaminophen and NSAIDs. She presented to the urgent care on 02/21/20 for evaluation of this complaint and received toradol, meloxicam and tizanidine. She states that the toradol relieved her pain for the past two days and she has only needed to take one dose of the meloxicam. Additionally, patient was advised to sleep with a pillow between her knees and to apply a hot pack to her lower back which she reports has significantly helped her symptoms. She reports that she had also been sleeping on her cough for the past several weeks which may have contributed to her symptoms.  OBJECTIVE: Vitals: HDS, afebrile Physical examination: No overlying erythema, bruising, lesion. Tenderness to palpation of right paraspinal muscles. Full ROM with flexion, extension and rotation of the trunk, however endorses pain with rotation to the left.  ASSESSMENT/PLAN: Patient's low back pain consistent with musculoskeletal etiology such as a muscle strain. Patient's job, weight, and sleep environment likely contributory. -Advised to continue sleeping in bed with pillow between knees -Continue hot compress on lower back -Mild symptoms: acetaminophen, ibuprofen -Moderate symptoms: May take prescribed tizanidine -Severe symptoms: Contact our clinic or present to the ED/UC

## 2020-02-25 NOTE — Assessment & Plan Note (Signed)
Patient reports that she has stopped taking oral vitamin D supplementation since starting vitamin B12 injections. Patient reports that she still has remaining tablets of 50,000 units at home. -Advised patient to continue taking weekly vitamin D 50,000 units -Discussed need to transition to daily supplementation upon completion of loading dose

## 2020-02-27 ENCOUNTER — Encounter: Payer: Self-pay | Admitting: Physical Therapy

## 2020-02-27 ENCOUNTER — Ambulatory Visit: Payer: Medicaid Other | Attending: Orthopedic Surgery | Admitting: Physical Therapy

## 2020-02-27 ENCOUNTER — Other Ambulatory Visit: Payer: Self-pay

## 2020-02-27 DIAGNOSIS — R293 Abnormal posture: Secondary | ICD-10-CM | POA: Insufficient documentation

## 2020-02-27 DIAGNOSIS — M6281 Muscle weakness (generalized): Secondary | ICD-10-CM | POA: Diagnosis present

## 2020-02-27 DIAGNOSIS — M4322 Fusion of spine, cervical region: Secondary | ICD-10-CM | POA: Diagnosis present

## 2020-02-28 ENCOUNTER — Encounter: Payer: Self-pay | Admitting: Physical Therapy

## 2020-02-28 NOTE — Therapy (Signed)
[place], [place], [address] Phone: [phone]   Fax:  [phone]  Physical Therapy Treatment  Patient Details  Name: [name] MRN: [id number] Date of Birth: 04/27/1970 Referring Provider (PT): Dr Phylliss Bob   Encounter Date: 02/27/2020   PT End of Session - 02/28/20 1121    Visit Number 6    Number of Visits 8    Date for PT Re-Evaluation 03/18/20    PT Start Time 1630    PT Stop Time 1712    PT Time Calculation (min) 42 min    Activity Tolerance Patient tolerated treatment well    Behavior During Therapy [facility] for tasks assessed/performed           Past Medical History:  Diagnosis Date  . Anemia   . Fibroids, submucosal   . History of blood transfusion 02/2011   [facility] - ? 2-3 units transfused    Past Surgical History:  Procedure Laterality Date  . ANTERIOR CERVICAL DECOMPRESSION/DISCECTOMY FUSION 4 LEVELS N/A 04/26/2019   Procedure: ANTERIOR CERVICAL DECOMPRESSION FUSION CERVICAL 4-5, CERVICAL 5-6, CERVICAL 6-7 WITH INSTRUMENTATION AND ALLOGRAFT;  Surgeon: Phylliss Bob, MD;  Location: [facility];  Service: Orthopedics;  Laterality: N/A;  . APPENDECTOMY    . BILATERAL SALPINGECTOMY Bilateral 04/16/2013   Procedure: BILATERAL SALPINGECTOMY;  Surgeon: Lavonia Drafts, MD;  Location: [facility];  Service: Gynecology;  Laterality: Bilateral;  . [place], 2002  . DILATION AND CURETTAGE OF UTERUS N/A 03/31/2012   Procedure: DILATATION AND CURETTAGE;  Surgeon: Emily Filbert, MD;  Location: [facility];  Service: Gynecology;  Laterality: N/A;  . LAPAROSCOPIC LYSIS OF ADHESIONS N/A 03/31/2012   Procedure: LAPAROSCOPIC LYSIS OF ADHESIONS;  Surgeon: Emily Filbert, MD;  Location: [facility];  Service: Gynecology;  Laterality: N/A;  . LAPAROSCOPY N/A 03/31/2012   Procedure: LAPAROSCOPY OPERATIVE;  Surgeon: Emily Filbert, MD;  Location: [facility];  Service: Gynecology;  Laterality: N/A;  . NOVASURE ABLATION  N/A 03/31/2012   Procedure: NOVASURE ABLATION;  Surgeon: Emily Filbert, MD;  Location: [facility];  Service: Gynecology;  Laterality: N/A;  . ROBOTIC ASSISTED TOTAL HYSTERECTOMY N/A 04/16/2013   Procedure: ROBOTIC ASSISTED TOTAL HYSTERECTOMY;  Surgeon: Lavonia Drafts, MD;  Location: [facility];  Service: Gynecology;  Laterality: N/A;  . TUBAL LIGATION  2002  . WISDOM TOOTH EXTRACTION      There were no vitals filed for this visit.   Subjective Assessment - 02/27/20 1629    Subjective Patients main problem has been her back. She has been to the ED because of her back. She went to the MD who may send her to PT for her back.    Pertinent History Hysterectomy, 2 cesareans, appendectomy    Limitations Sitting;Lifting;House hold activities    Patient Stated Goals Be able to take control of the pain and continue to heal to 100%    Currently in Pain? Yes    Pain Score 2     Pain Location Neck    Pain Orientation Right    Pain Descriptors / Indicators Aching    Pain Type Acute pain    Pain Onset 1 to 4 weeks ago    Pain Frequency Constant    Aggravating Factors  use of the left arm    Pain Relieving Factors heat    Effect of Pain on Daily Activities wakes her up from sleeping    Multiple Pain Sites Yes    Pain Score  7    Pain Location Back    Pain Orientation Right;Left    Pain Descriptors / Indicators Aching    Pain Type Chronic pain    Pain Onset More than a month ago    Pain Frequency Constant    Aggravating Factors  standing and walking    Pain Relieving Factors rest    Effect of Pain on Daily Activities difficulty standing at work                             [facility] Adult PT Treatment/Exercise - 02/28/20 0001      Neck Exercises: Standing   Other Standing Exercises shoulder extension x20 red; scap retraction x20 red      Neck Exercises: Seated   Other Seated Exercise Horizontal abducytion, bilateral flexion, bilateral ER all with cuing for posture yellow band x20       Manual Therapy   Soft tissue mobilization Left upper trap/levator scap and bilateral cervical paraspinals using IASTYM     Passive ROM Upper trap and levator scap stretching    Manual Traction With suboccipital release    McConnell bil upper trap activation      Neck Exercises: Stretches   Upper Trapezius Stretch 1 rep;Left;30 seconds    Levator Stretch 1 rep;30 seconds;Left            Trigger Point Dry Needling - 02/28/20 0001    Consent Given? Yes    Education Handout Provided Previously provided    Upper Trapezius Response Twitch reponse elicited;Palpable increased muscle length    Cervical multifidi Response Palpable increased muscle length;Twitch reponse elicited                PT Education - 02/27/20 1702    Education Details HEP and symptom mangement    Person(s) Educated Patient    Methods Explanation;Tactile cues;Verbal cues;Demonstration    Comprehension Verbalized understanding;Returned demonstration;Verbal cues required;Tactile cues required            PT Short Term Goals - 02/13/20 1004      PT SHORT TERM GOAL #1   Title Patient will increase bilateral shoulder flexion strength to 4+/5    Baseline left shoulder still limited    Time 3    Period Weeks    Status On-going      PT SHORT TERM GOAL #2   Title Pt will demonstrates pain free B cervical rotation to >/= 65 degrees.    Baseline right rotation sitll limited    Time 3    Period Weeks    Status On-going      PT SHORT TERM GOAL #3   Title Patient will report a 50% reduction in headache frequency and intensity    Baseline no headaches    Time 3    Period Weeks    Status Achieved             PT Long Term Goals - 02/13/20 1005      PT LONG TERM GOAL #1   Title Patient will report a 75% reduction in headache frequncy and intesity in order to work    Baseline no headaches    Time 6    Period Weeks    Status Achieved      PT LONG TERM GOAL #2   Title Patient will use her arms  at work without increased headaches or pain in ipper traps and arms    Baseline imprpivng  motion in all planes    Time 6    Period Weeks    Status On-going      PT LONG TERM GOAL #3   Title Pt will have no occurrence of headaches/ability to prevent onset of HA with HEP performance.    Baseline working on her exercises    Time 6    Period Weeks    Status On-going      PT LONG TERM GOAL #4   Title Pt will return to work full time with no c/o limitation from pain.    Baseline mild pain at work    Time 8    Period Weeks    Status On-going                 Plan - 02/28/20 1122    Clinical Impression Statement Patient was limited in er ability to perfrom ther-ex today 2nd to a significant increase in lower back pain. Her left upper trap had spasming today but that is progressing. Overall her neck is porgressing well. her lower back is her chief complaint today. She was shown how to use the tennis ball given for her upper trap to her lower back too. She was still able to complete some of the posterior chain strengthening, but she could feel it in her back. She sees her MD on Monday. We may change our    Comorbidities Anemia    Examination-Activity Limitations Sit;Sleep;Carry;Lift    Examination-Participation Restrictions Driving;Community Activity;Shop;Cleaning;Laundry    Stability/Clinical Decision Making Stable/Uncomplicated    Clinical Decision Making Low    Rehab Potential Good    PT Frequency 1x / week    PT Duration 8 weeks    PT Treatment/Interventions Spinal Manipulations;Joint Manipulations;Taping;Dry needling;Passive range of motion;Manual techniques;Patient/family education;Therapeutic exercise;Neuromuscular re-education;Therapeutic activities;Functional mobility training;Traction;Moist Heat;Iontophoresis 4mg /ml Dexamethasone;ADLs/Self Care Home Management;Cryotherapy;Electrical Stimulation    PT Next Visit Plan Assess HEP and progress PRN, response to DN with e-stom and  manual for left upper trap/levator/suboccipitals, postural strengthening    PT Home Exercise Plan [id number], postureal education    Consulted and Agree with Plan of Care Patient           Patient will benefit from skilled therapeutic intervention in order to improve the following deficits and impairments:  Pain,Postural dysfunction,Impaired flexibility,Increased fascial restricitons,Decreased strength,Decreased activity tolerance,Improper body mechanics,Impaired perceived functional ability,Hypomobility  Visit Diagnosis: Abnormal posture  Muscle weakness (generalized)  Fusion of spine, cervical region     Problem List Patient Active Problem List   Diagnosis Date Noted  . Right low back pain 02/25/2020  . Viral conjunctivitis 02/14/2020  . Vitamin B12 deficiency 01/02/2020  . Vitamin D deficiency 12/31/2019  . Elevated TSH 12/31/2019  . Muscle pain 12/19/2019  . Anxiety 12/19/2019  . Radiculopathy 04/26/2019  . Cervical myelopathy ([place]) 03/07/2019  . Migraine 02/19/2019  . Shoulder pain, left 12/07/2018  . Iron deficiency anemia 11/07/2018  . Elevated blood pressure reading 11/07/2018  . Preventative health care 11/07/2018  . Postoperative state 04/16/2013  . Hematometra 01/09/2013  . Menorrhagia 01/09/2013  . Chronic pelvic pain in female 01/09/2013    Carney Living PT DPT  02/28/2020, 4:25 PM  [place], [place], [address] Phone: [phone]   Fax:  [phone]  Name: [name] MRN: [id number] Date of Birth: [birth year]

## 2020-02-29 NOTE — Progress Notes (Signed)
Internal Medicine Clinic Attending  Case discussed with Dr. Johnson  At the time of the visit.  We reviewed the resident's history and exam and pertinent patient test results.  I agree with the assessment, diagnosis, and plan of care documented in the resident's note.  

## 2020-03-01 ENCOUNTER — Encounter: Payer: Self-pay | Admitting: Physical Therapy

## 2020-03-03 ENCOUNTER — Ambulatory Visit: Payer: Medicaid Other | Admitting: Physical Therapy

## 2020-03-03 ENCOUNTER — Ambulatory Visit (INDEPENDENT_AMBULATORY_CARE_PROVIDER_SITE_OTHER): Payer: Medicaid Other | Admitting: Student

## 2020-03-03 ENCOUNTER — Encounter: Payer: Self-pay | Admitting: Student

## 2020-03-03 ENCOUNTER — Other Ambulatory Visit: Payer: Self-pay

## 2020-03-03 VITALS — BP 147/68 | HR 56 | Temp 98.2°F | Ht 66.0 in | Wt 248.0 lb

## 2020-03-03 DIAGNOSIS — E538 Deficiency of other specified B group vitamins: Secondary | ICD-10-CM | POA: Diagnosis not present

## 2020-03-03 DIAGNOSIS — M545 Low back pain, unspecified: Secondary | ICD-10-CM | POA: Diagnosis present

## 2020-03-03 DIAGNOSIS — D510 Vitamin B12 deficiency anemia due to intrinsic factor deficiency: Secondary | ICD-10-CM

## 2020-03-03 DIAGNOSIS — R7989 Other specified abnormal findings of blood chemistry: Secondary | ICD-10-CM

## 2020-03-03 DIAGNOSIS — E063 Autoimmune thyroiditis: Secondary | ICD-10-CM

## 2020-03-03 DIAGNOSIS — E559 Vitamin D deficiency, unspecified: Secondary | ICD-10-CM | POA: Diagnosis not present

## 2020-03-03 DIAGNOSIS — R030 Elevated blood-pressure reading, without diagnosis of hypertension: Secondary | ICD-10-CM | POA: Diagnosis not present

## 2020-03-03 DIAGNOSIS — E038 Other specified hypothyroidism: Secondary | ICD-10-CM | POA: Diagnosis not present

## 2020-03-03 DIAGNOSIS — D509 Iron deficiency anemia, unspecified: Secondary | ICD-10-CM | POA: Diagnosis not present

## 2020-03-03 MED ORDER — CHOLECALCIFEROL 25 MCG (1000 UT) PO CAPS
1000.0000 [IU] | ORAL_CAPSULE | Freq: Every day | ORAL | 3 refills | Status: DC
Start: 2020-03-03 — End: 2020-05-13

## 2020-03-03 MED ORDER — CYANOCOBALAMIN 1000 MCG/ML IJ SOLN
1000.0000 ug | INTRAMUSCULAR | Status: AC
  Administered 2020-03-03: 1000 ug via INTRAMUSCULAR

## 2020-03-03 NOTE — Assessment & Plan Note (Signed)
Patient reports that her back pain is much improved since last visit. She continues to work with physical therapy for her chronic neck pain. -Referral to physical therapy to begin working on back as well as neck

## 2020-03-03 NOTE — Progress Notes (Signed)
   CC: Follow-up vitamin B12, vitamin D, back pain, elevated TSH   HPI:  [name] is a 50 y.o.   Past Medical History:  Diagnosis Date  . Anemia   . Fibroids, submucosal   . History of blood transfusion 02/2011   [facility] - ? 2-3 units transfused   Review of Systems:  Endorses right lower back pain, constipation, weight gain, headaches. Denies fatigue, depression, fevers, chills.  Physical Exam:  Vitals:   03/03/20 0843  BP: (!) 154/84  Pulse: 60  Temp: 98.2 F (36.8 C)  TempSrc: Oral  SpO2: 100%  Weight: 248 lb (112.5 kg)  Height: 5\' 6"  (1.676 m)   Physical Exam Constitutional:      General: She is not in acute distress.    Appearance: She is obese. She is not ill-appearing.  Cardiovascular:     Rate and Rhythm: Normal rate and regular rhythm.     Pulses: Normal pulses.     Heart sounds: Normal heart sounds.  Pulmonary:     Effort: Pulmonary effort is normal. No respiratory distress.     Breath sounds: Normal breath sounds.  Abdominal:     General: Abdomen is flat. Bowel sounds are normal.     Palpations: Abdomen is soft.     Tenderness: There is no abdominal tenderness.  Neurological:     General: No focal deficit present.     Mental Status: Mental status is at baseline.  Psychiatric:        Mood and Affect: Mood normal.        Behavior: Behavior normal.      Assessment & Plan:   See Encounters Tab for problem based charting.  Patient discussed with Dr. Daryll Drown

## 2020-03-03 NOTE — Assessment & Plan Note (Signed)
25OH Vitamin D of 9.94 in December 2021. Patient started on weekly vitamin D supplementation 50,000 units. Patient stopped taking medication for brief period due to confusion with vitamin B12 injections. Patient reports that she has 1-2 tablets left at home. -Advised patient to complete course of vitamin D 50,000 units weekly -Transition to daily dosing of 1,000 units cholecalciferol upon completion of loading dose -Consider obtaining 25-OH vitamin D level in 1-2 months

## 2020-03-03 NOTE — Assessment & Plan Note (Addendum)
Patient's thyroid studies consistent with subclinical hypothyroidism (TSH 9.905, free T4 1.03) in beginning of December. Patient would benefit from continued close monitoring of this condition. Associated vitamin B12 deficiency raises suspicion for hashimoto's thyroiditis. -TSH today  ADDENDUM: [name]'s TSH remains elevated at 6.320 from 9.905. In the setting of her subclinical hypothyroidism, severe vitamin B12 deficiency, fatigue and constipation. 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   Per the algorithm on UpToDate, TSH <6.9, age <65, symptoms of hypothyroidism (fatigue, constipation), we should treat with T4. Will prescribe levothyroxine 52mcg daily with plan to titrate up to a goal TSH of 0.5-2.5 with monitoring every six weeks until stable dose achieved then transition to annual monitoring.

## 2020-03-03 NOTE — Assessment & Plan Note (Signed)
Patient's blood pressure elevated to 154/84 today in clinic. Review of patient's blood pressure readings reveals that patient is typically within goal of <140/90. Discussed importance of continued exercise and diet for prevention of progression of this condition. -Hold off starting antihypertensive at this time, if continues to remain elevated, may consider starting chlorthalidone or amlodipine

## 2020-03-03 NOTE — Assessment & Plan Note (Signed)
Patient tolerating weekly vitamin B12 injections well without adverse effects. She reports that she feels "a lot better" since starting this medication. Patient has received approximately six injections at this point. -Transition to monthly injections of vitamin B12 -CBC no Diff to follow-up macrocytosis

## 2020-03-03 NOTE — Patient Instructions (Signed)
Ms. Akamine,  It was a pleasure seeing you in clinic today.  For your vitamin B12 deficiency, we will space out your vitamin B12 injections from weekly to monthly.   For your vitamin D deficiency, please finish the weekly pills that you have at home (50,000 units). Once you finish the last one to two pills, transition to the daily lower dose medication (1,000 units). This vitamin is important for bone health.  For your thyroid, we will repeat your thyroid labs today. Depending on the results, we may need to start daily supplementation of thyroid hormone. Low thyroid hormone levels can result in symptoms like weight gain, constipation, and fatigue so it is important that we get good control of this condition.  For your back pain, we will place a referral to physical therapy. Keep up the good work!  For your blood pressure, although it is elevated today, it has been within normal limits at prior visits.  Sincerely, Dr. Paulla Dolly, MD

## 2020-03-04 LAB — CBC
Hematocrit: 39.2 % (ref 34.0–46.6)
Hemoglobin: 13.1 g/dL (ref 11.1–15.9)
MCH: 30.2 pg (ref 26.6–33.0)
MCHC: 33.4 g/dL (ref 31.5–35.7)
MCV: 90 fL (ref 79–97)
Platelets: 319 10*3/uL (ref 150–450)
RBC: 4.34 x10E6/uL (ref 3.77–5.28)
RDW: 13.6 % (ref 11.7–15.4)
WBC: 4.5 10*3/uL (ref 3.4–10.8)

## 2020-03-04 LAB — TSH: TSH: 6.32 u[IU]/mL — ABNORMAL HIGH (ref 0.450–4.500)

## 2020-03-04 MED ORDER — LEVOTHYROXINE SODIUM 50 MCG PO TABS: 50.0000 ug | ORAL_TABLET | Freq: Every day | ORAL | 1 refills | Status: DC

## 2020-03-04 NOTE — Addendum Note (Signed)
Addended by: Cato Mulligan C on: 03/04/2020 11:12 AM   Modules accepted: Orders

## 2020-03-11 ENCOUNTER — Ambulatory Visit
Admission: RE | Admit: 2020-03-11 | Discharge: 2020-03-11 | Disposition: A | Discharge status: Home / Self Care | Payer: Medicaid Other | Source: Ambulatory Visit | Attending: Orthopedic Surgery | Admitting: Orthopedic Surgery

## 2020-03-11 ENCOUNTER — Encounter: Payer: Self-pay | Admitting: Physical Therapy

## 2020-03-11 ENCOUNTER — Ambulatory Visit: Payer: Medicaid Other | Admitting: Physical Therapy

## 2020-03-11 ENCOUNTER — Other Ambulatory Visit: Payer: Self-pay

## 2020-03-11 DIAGNOSIS — M4802 Spinal stenosis, cervical region: Secondary | ICD-10-CM | POA: Diagnosis not present

## 2020-03-11 DIAGNOSIS — M4322 Fusion of spine, cervical region: Secondary | ICD-10-CM | POA: Diagnosis not present

## 2020-03-11 DIAGNOSIS — R293 Abnormal posture: Secondary | ICD-10-CM | POA: Diagnosis not present

## 2020-03-11 DIAGNOSIS — M542 Cervicalgia: Secondary | ICD-10-CM

## 2020-03-11 DIAGNOSIS — M47813 Spondylosis without myelopathy or radiculopathy, cervicothoracic region: Secondary | ICD-10-CM | POA: Diagnosis not present

## 2020-03-11 DIAGNOSIS — M6281 Muscle weakness (generalized): Secondary | ICD-10-CM

## 2020-03-11 DIAGNOSIS — M5021 Other cervical disc displacement,  high cervical region: Secondary | ICD-10-CM | POA: Diagnosis not present

## 2020-03-12 ENCOUNTER — Encounter: Payer: Self-pay | Admitting: Physical Therapy

## 2020-03-12 NOTE — Therapy (Signed)
[place], [place], [address] Phone: [phone]   Fax:  [phone]  Physical Therapy Treatment/Re-eval   Patient Details  Name: ASTRAEA GAUGHRAN MRN: [id number] Date of Birth: January 05, 1971 Referring Provider (PT): Gilles Chiquito MD   Encounter Date: 03/11/2020   PT End of Session - 03/12/20 1326    Visit Number 7    Number of Visits 14    Date for PT Re-Evaluation 04/23/20    Authorization Type [employer]    PT Start Time 1545    PT Stop Time 1628    PT Time Calculation (min) 43 min    Activity Tolerance Patient tolerated treatment well    Behavior During Therapy [facility] for tasks assessed/performed           Past Medical History:  Diagnosis Date  . Anemia   . Fibroids, submucosal   . History of blood transfusion 02/2011   [facility] - ? 2-3 units transfused    Past Surgical History:  Procedure Laterality Date  . ANTERIOR CERVICAL DECOMPRESSION/DISCECTOMY FUSION 4 LEVELS N/A 04/26/2019   Procedure: ANTERIOR CERVICAL DECOMPRESSION FUSION CERVICAL 4-5, CERVICAL 5-6, CERVICAL 6-7 WITH INSTRUMENTATION AND ALLOGRAFT;  Surgeon: Phylliss Bob, MD;  Location: [place];  Service: Orthopedics;  Laterality: N/A;  . APPENDECTOMY    . BILATERAL SALPINGECTOMY Bilateral 04/16/2013   Procedure: BILATERAL SALPINGECTOMY;  Surgeon: Lavonia Drafts, MD;  Location: [facility];  Service: Gynecology;  Laterality: Bilateral;  . [place], 2002  . DILATION AND CURETTAGE OF UTERUS N/A 03/31/2012   Procedure: DILATATION AND CURETTAGE;  Surgeon: Emily Filbert, MD;  Location: [facility];  Service: Gynecology;  Laterality: N/A;  . LAPAROSCOPIC LYSIS OF ADHESIONS N/A 03/31/2012   Procedure: LAPAROSCOPIC LYSIS OF ADHESIONS;  Surgeon: Emily Filbert, MD;  Location: [facility];  Service: Gynecology;  Laterality: N/A;  . LAPAROSCOPY N/A 03/31/2012   Procedure: LAPAROSCOPY OPERATIVE;  Surgeon: Emily Filbert, MD;  Location: [facility];  Service: Gynecology;   Laterality: N/A;  . NOVASURE ABLATION N/A 03/31/2012   Procedure: NOVASURE ABLATION;  Surgeon: Emily Filbert, MD;  Location: [facility];  Service: Gynecology;  Laterality: N/A;  . ROBOTIC ASSISTED TOTAL HYSTERECTOMY N/A 04/16/2013   Procedure: ROBOTIC ASSISTED TOTAL HYSTERECTOMY;  Surgeon: Lavonia Drafts, MD;  Location: [facility];  Service: Gynecology;  Laterality: N/A;  . TUBAL LIGATION  2002  . WISDOM TOOTH EXTRACTION      There were no vitals filed for this visit.   Subjective Assessment - 03/11/20 1550    Subjective Patient has a long hisotyr of lower back pain. Every few years she has an episode where her lower back flairs up. She feels it most when she is standing. Patient had an acute onset aof pain about 4 weeks ago. The pain has improved but it is still painful when she does activity.    Pertinent History Hysterectomy, 2 cesareans, appendectomy    Limitations Sitting;Lifting;House hold activities    How long can you stand comfortably? Standing for aperiod of time increase pain    How long can you walk comfortably? walking increases the pain    Patient Stated Goals Be able to take control of the pain and continue to heal to 100%    Currently in Pain? Yes    Pain Score 7     Pain Location Back    Pain Orientation Right;Mid    Pain Descriptors / Indicators Aching    Pain Type Chronic pain  Pain Radiating Towards The pain stays in the middle right sside of her bac.    Pain Onset More than a month ago    Pain Frequency Intermittent    Aggravating Factors  standing and walking    Pain Relieving Factors tylenol and rest    Effect of Pain on Daily Activities hurts to move at times    Multiple Pain Sites No              [facility] PT Assessment - 03/12/20 0001      Assessment   Medical Diagnosis Low Back Pain    Referring Provider (PT) Gilles Chiquito MD      Precautions   Precautions None      Restrictions   Weight Bearing Restrictions No      Balance Screen   Has the  patient fallen in the past 6 months No    Has the patient had a decrease in activity level because of a fear of falling?  No    Is the patient reluctant to leave their home because of a fear of falling?  No      Prior Function   Level of Independence Independent    Vocation Other (comment)    Vocation Requirements Stylist, part time at [employer]      AROM   Overall AROM Comments Pain with right hip flexion past 90 degrees but has movement; pain with IR    Lumbar Flexion 50    Lumbar Extension 0    Lumbar - Right Side Bend Painfull    Lumbar - Left Side Bend less pain then right    Lumbar - Right Rotation Painfull in her neck    Lumbar - Left Rotation Painful in her neck      Strength   Right Hip Flexion 4+/5    Left Hip Flexion 4+/5      Palpation   Palpation comment significant spasming in the lower lumbar spine; upper gluteal and sacral area                         [facility] Adult PT Treatment/Exercise - 03/12/20 0001      Lumbar Exercises: Stretches   Active Hamstring Stretch Limitations reviewed seated hamstring stretch 3x20 sec hold    Lower Trunk Rotation Limitations x20    Piriformis Stretch Limitations 2x20 sec hold      Lumbar Exercises: Supine   AB Set Limitations reviewed abdominal breathing      Manual Therapy   Joint Mobilization IR ER MEt for sacrum    Soft tissue mobilization to upper gluteal area    Myofascial Release to lower lumbar paraspinals; to upper gluteals    Manual Traction LAD 3-4x with occilations                  PT Education - 03/11/20 1557    Education Details reviewed HEP for lower back    Person(s) Educated Patient    Methods Explanation;Demonstration;Verbal cues;Tactile cues    Comprehension Verbalized understanding;Returned demonstration;Verbal cues required;Tactile cues required            PT Short Term Goals - 02/13/20 1004      PT SHORT TERM GOAL #1   Title Patient will increase bilateral shoulder flexion  strength to 4+/5    Baseline left shoulder still limited    Time 3    Period Weeks    Status On-going      PT SHORT  TERM GOAL #2   Title Pt will demonstrates pain free B cervical rotation to >/= 65 degrees.    Baseline right rotation sitll limited    Time 3    Period Weeks    Status On-going      PT SHORT TERM GOAL #3   Title Patient will report a 50% reduction in headache frequency and intensity    Baseline no headaches    Time 3    Period Weeks    Status Achieved             PT Long Term Goals - 02/13/20 1005      PT LONG TERM GOAL #1   Title Patient will report a 75% reduction in headache frequncy and intesity in order to work    Baseline no headaches    Time 6    Period Weeks    Status Achieved      PT LONG TERM GOAL #2   Title Patient will use her arms at work without increased headaches or pain in ipper traps and arms    Baseline imprpivng motion in all planes    Time 6    Period Weeks    Status On-going      PT LONG TERM GOAL #3   Title Pt will have no occurrence of headaches/ability to prevent onset of HA with HEP performance.    Baseline working on her exercises    Time 6    Period Weeks    Status On-going      PT LONG TERM GOAL #4   Title Pt will return to work full time with no c/o limitation from pain.    Baseline mild pain at work    Time 8    Period Weeks    Status On-going                 Plan - 03/11/20 1558    Clinical Impression Statement Patient presents with low back pain that increases with standing and prolonged sitting. She has significant spasming in her lower lumbar spien, gluteals, and sacrum. She has increased pain with flexion and can only come to nutral in standing . She has    Personal Factors and Comorbidities Age;Time since onset of injury/illness/exacerbation;Past/Current Experience;Profession    Comorbidities Anemia    Examination-Activity Limitations Sit;Sleep;Carry;Lift    Examination-Participation Restrictions  Driving;Community Activity;Shop;Cleaning;Laundry    Stability/Clinical Decision Making Stable/Uncomplicated    Clinical Decision Making Low    Rehab Potential Good    PT Frequency 1x / week    PT Duration 8 weeks    PT Treatment/Interventions Spinal Manipulations;Joint Manipulations;Taping;Dry needling;Passive range of motion;Manual techniques;Patient/family education;Therapeutic exercise;Neuromuscular re-education;Therapeutic activities;Functional mobility training;Traction;Moist Heat;Iontophoresis 4mg /ml Dexamethasone;ADLs/Self Care Home Management;Cryotherapy;Electrical Stimulation    PT Next Visit Plan Assess HEP and progress PRN, response to DN with e-stom and manual for left upper trap/levator/suboccipitals, postural strengthening    PT Home Exercise Plan [id number], postureal education    Consulted and Agree with Plan of Care Patient           Patient will benefit from skilled therapeutic intervention in order to improve the following deficits and impairments:  Pain,Postural dysfunction,Impaired flexibility,Increased fascial restricitons,Decreased strength,Decreased activity tolerance,Improper body mechanics,Impaired perceived functional ability,Hypomobility  Visit Diagnosis: Abnormal posture  Muscle weakness (generalized)  Fusion of spine, cervical region     Problem List Patient Active Problem List   Diagnosis Date Noted  . Right low back pain 02/25/2020  . Viral conjunctivitis 02/14/2020  . Pernicious  anemia 01/02/2020  . Vitamin D deficiency 12/31/2019  . Hashimoto's thyroiditis 12/31/2019  . Muscle pain 12/19/2019  . Anxiety 12/19/2019  . Radiculopathy 04/26/2019  . Cervical myelopathy ([place]) 03/07/2019  . Migraine 02/19/2019  . Shoulder pain, left 12/07/2018  . Iron deficiency anemia 11/07/2018  . Elevated blood pressure reading 11/07/2018  . Preventative health care 11/07/2018  . Postoperative state 04/16/2013  . Hematometra 01/09/2013  . Menorrhagia  01/09/2013  . Chronic pelvic pain in female 01/09/2013    Carney Living 03/12/2020, 5:03 PM  [facility] [address] [place], [place], [address] Phone: [phone]   Fax:  [phone]  Name: CRICKETT ABBETT MRN: [id number] Date of Birth: 04/26/1970

## 2020-03-12 NOTE — Progress Notes (Signed)
Internal Medicine Clinic Attending  Case discussed with Dr. Johnson  At the time of the visit.  We reviewed the resident's history and exam and pertinent patient test results.  I agree with the assessment, diagnosis, and plan of care documented in the resident's note.  

## 2020-03-17 DIAGNOSIS — M542 Cervicalgia: Secondary | ICD-10-CM | POA: Diagnosis not present

## 2020-03-17 DIAGNOSIS — G43009 Migraine without aura, not intractable, without status migrainosus: Secondary | ICD-10-CM | POA: Diagnosis not present

## 2020-03-18 ENCOUNTER — Ambulatory Visit: Payer: Medicaid Other | Attending: Orthopedic Surgery | Admitting: Physical Therapy

## 2020-03-18 ENCOUNTER — Encounter: Payer: Self-pay | Admitting: Physical Therapy

## 2020-03-18 ENCOUNTER — Other Ambulatory Visit: Payer: Self-pay

## 2020-03-18 DIAGNOSIS — M4322 Fusion of spine, cervical region: Secondary | ICD-10-CM | POA: Insufficient documentation

## 2020-03-18 DIAGNOSIS — M6281 Muscle weakness (generalized): Secondary | ICD-10-CM | POA: Insufficient documentation

## 2020-03-18 DIAGNOSIS — R293 Abnormal posture: Secondary | ICD-10-CM | POA: Diagnosis present

## 2020-03-19 NOTE — Therapy (Signed)
[place], [place], [address] Phone: [phone]   Fax:  [phone]  Physical Therapy Treatment  Patient Details  Name: [name] MRN: [id number] Date of Birth: 1970-02-19 Referring Provider (PT): Gilles Chiquito MD   Encounter Date: 03/18/2020   PT End of Session - 03/18/20 1552    Visit Number 8    Number of Visits 14    Date for PT Re-Evaluation 04/23/20    Authorization Type [employer]    Authorization - Visit Number 8    Authorization - Number of Visits 27    PT Start Time 1500    PT Stop Time 1542    PT Time Calculation (min) 42 min    Activity Tolerance Patient tolerated treatment well    Behavior During Therapy [facility] for tasks assessed/performed           Past Medical History:  Diagnosis Date  . Anemia   . Fibroids, submucosal   . History of blood transfusion 02/2011   [facility] - ? 2-3 units transfused    Past Surgical History:  Procedure Laterality Date  . ANTERIOR CERVICAL DECOMPRESSION/DISCECTOMY FUSION 4 LEVELS N/A 04/26/2019   Procedure: ANTERIOR CERVICAL DECOMPRESSION FUSION CERVICAL 4-5, CERVICAL 5-6, CERVICAL 6-7 WITH INSTRUMENTATION AND ALLOGRAFT;  Surgeon: Phylliss Bob, MD;  Location: [place];  Service: Orthopedics;  Laterality: N/A;  . APPENDECTOMY    . BILATERAL SALPINGECTOMY Bilateral 04/16/2013   Procedure: BILATERAL SALPINGECTOMY;  Surgeon: Lavonia Drafts, MD;  Location: [facility];  Service: Gynecology;  Laterality: Bilateral;  . [place], 2002  . DILATION AND CURETTAGE OF UTERUS N/A 03/31/2012   Procedure: DILATATION AND CURETTAGE;  Surgeon: Emily Filbert, MD;  Location: [facility];  Service: Gynecology;  Laterality: N/A;  . LAPAROSCOPIC LYSIS OF ADHESIONS N/A 03/31/2012   Procedure: LAPAROSCOPIC LYSIS OF ADHESIONS;  Surgeon: Emily Filbert, MD;  Location: [facility];  Service: Gynecology;  Laterality: N/A;  . LAPAROSCOPY N/A 03/31/2012   Procedure: LAPAROSCOPY OPERATIVE;   Surgeon: Emily Filbert, MD;  Location: [facility];  Service: Gynecology;  Laterality: N/A;  . NOVASURE ABLATION N/A 03/31/2012   Procedure: NOVASURE ABLATION;  Surgeon: Emily Filbert, MD;  Location: [facility];  Service: Gynecology;  Laterality: N/A;  . ROBOTIC ASSISTED TOTAL HYSTERECTOMY N/A 04/16/2013   Procedure: ROBOTIC ASSISTED TOTAL HYSTERECTOMY;  Surgeon: Lavonia Drafts, MD;  Location: [facility];  Service: Gynecology;  Laterality: N/A;  . TUBAL LIGATION  2002  . WISDOM TOOTH EXTRACTION      There were no vitals filed for this visit.   Subjective Assessment - 03/18/20 1510    Subjective Patient reports her back has been better, but she was in pain this morning when she was standing. she has been to the surgeon who thinks the fusion is doing well. She has also been to the neurologist whogave her different medication and advised she continue with therapy and needling. She had the begginings of a migrane on Saturday but was able to control it with ice.    Pertinent History Hysterectomy, 2 cesareans, appendectomy    Limitations Sitting;Lifting;House hold activities    How long can you stand comfortably? Standing for aperiod of time increase pain    How long can you walk comfortably? walking increases the pain    Patient Stated Goals Be able to take control of the pain and continue to heal to 100%    Currently in Pain? Yes    Pain Score  3     Pain Location Back    Pain Orientation Right;Left    Pain Descriptors / Indicators Aching    Pain Type Chronic pain    Pain Onset More than a month ago    Pain Frequency Intermittent    Aggravating Factors  standing and walking    Pain Relieving Factors tylenol    Effect of Pain on Daily Activities hurts to move at times    Pain Score --   No significant pain in the neck today                            [facility] Adult PT Treatment/Exercise - 03/19/20 0001      Neck Exercises: Standing   Other Standing Exercises shoulder extension x20  green; scap retraction x20 green      Lumbar Exercises: Stretches   Lower Trunk Rotation Limitations x20    Piriformis Stretch Limitations 2x20 sec hold      Manual Therapy   Manual therapy comments skilled palpation of trigger points.    Joint Mobilization IR ER MEt for sacrum    Soft tissue mobilization to upper gluteal area    Myofascial Release to lower lumbar paraspinals; to upper gluteals    Manual Traction LAD 3-4x with occilations            Trigger Point Dry Needling - 03/19/20 0001    Consent Given? Yes    Education Handout Provided Previously provided    Muscles Treated Back/Hip Lumbar multifidi;Quadratus lumborum    Upper Trapezius Response Twitch reponse elicited;Palpable increased muscle length    Lumbar multifidi Response Twitch response elicited;Palpable increased muscle length    Quadratus Lumborum Response Twitch response elicited;Palpable increased muscle length                PT Education - 03/18/20 1552    Education Details HEP and symptom mangement    Person(s) Educated Patient    Methods Explanation;Tactile cues;Demonstration;Verbal cues    Comprehension Returned demonstration;Verbalized understanding;Verbal cues required;Tactile cues required            PT Short Term Goals - 02/13/20 1004      PT SHORT TERM GOAL #1   Title Patient will increase bilateral shoulder flexion strength to 4+/5    Baseline left shoulder still limited    Time 3    Period Weeks    Status On-going      PT SHORT TERM GOAL #2   Title Pt will demonstrates pain free B cervical rotation to >/= 65 degrees.    Baseline right rotation sitll limited    Time 3    Period Weeks    Status On-going      PT SHORT TERM GOAL #3   Title Patient will report a 50% reduction in headache frequency and intensity    Baseline no headaches    Time 3    Period Weeks    Status Achieved             PT Long Term Goals - 02/13/20 1005      PT LONG TERM GOAL #1   Title Patient  will report a 75% reduction in headache frequncy and intesity in order to work    Baseline no headaches    Time 6    Period Weeks    Status Achieved      PT LONG TERM GOAL #2   Title Patient will use her  arms at work without increased headaches or pain in ipper traps and arms    Baseline imprpivng motion in all planes    Time 6    Period Weeks    Status On-going      PT LONG TERM GOAL #3   Title Pt will have no occurrence of headaches/ability to prevent onset of HA with HEP performance.    Baseline working on her exercises    Time 6    Period Weeks    Status On-going      PT LONG TERM GOAL #4   Title Pt will return to work full time with no c/o limitation from pain.    Baseline mild pain at work    Time 8    Period Weeks    Status On-going                 Plan - 03/19/20 1336    Clinical Impression Statement Patient is making good progress with her back. She had a signiifcant decrease in sapasming compared to the last visit. Seh had a great twtich respose in her lower back and left upper trap. We will continue to work on progressing her strengthening. She feels it most in her lower back when she stands for a long period of time,    Personal Factors and Comorbidities Age;Time since onset of injury/illness/exacerbation;Past/Current Experience;Profession    Comorbidities Anemia    Examination-Activity Limitations Sit;Sleep;Carry;Lift    Examination-Participation Restrictions Driving;Community Activity;Shop;Cleaning;Laundry    Stability/Clinical Decision Making Stable/Uncomplicated    Clinical Decision Making Low    Rehab Potential Good    PT Frequency 1x / week    PT Duration 8 weeks    PT Treatment/Interventions Spinal Manipulations;Joint Manipulations;Taping;Dry needling;Passive range of motion;Manual techniques;Patient/family education;Therapeutic exercise;Neuromuscular re-education;Therapeutic activities;Functional mobility training;Traction;Moist Heat;Iontophoresis  73m/ml Dexamethasone;ADLs/Self Care Home Management;Cryotherapy;Electrical Stimulation    PT Next Visit Plan Assess HEP and progress PRN, response to DN with e-stom and manual for left upper trap/levator/suboccipitals, postural strengthening    PT Home Exercise Plan [id number], postureal education    Consulted and Agree with Plan of Care Patient           Patient will benefit from skilled therapeutic intervention in order to improve the following deficits and impairments:  Pain,Postural dysfunction,Impaired flexibility,Increased fascial restricitons,Decreased strength,Decreased activity tolerance,Improper body mechanics,Impaired perceived functional ability,Hypomobility  Visit Diagnosis: Abnormal posture  Muscle weakness (generalized)  Fusion of spine, cervical region     Problem List Patient Active Problem List   Diagnosis Date Noted  . Right low back pain 02/25/2020  . Viral conjunctivitis 02/14/2020  . Pernicious anemia 01/02/2020  . Vitamin D deficiency 12/31/2019  . Hashimoto's thyroiditis 12/31/2019  . Muscle pain 12/19/2019  . Anxiety 12/19/2019  . Radiculopathy 04/26/2019  . Cervical myelopathy ([place] 03/07/2019  . Migraine 02/19/2019  . Shoulder pain, left 12/07/2018  . Iron deficiency anemia 11/07/2018  . Elevated blood pressure reading 11/07/2018  . Preventative health care 11/07/2018  . Postoperative state 04/16/2013  . Hematometra 01/09/2013  . Menorrhagia 01/09/2013  . Chronic pelvic pain in female 01/09/2013    DCarney LivingPR DPT  03/19/2020, 1:39 PM  [place] [address]: [phone]  Fax:  [phone] Name: Kimberly GODARMRN: [id number] of Birth: 111/09/1970

## 2020-03-24 ENCOUNTER — Ambulatory Visit: Payer: Medicaid Other | Admitting: Physical Therapy

## 2020-03-24 ENCOUNTER — Other Ambulatory Visit: Payer: Self-pay

## 2020-03-24 ENCOUNTER — Encounter: Payer: Self-pay | Admitting: Physical Therapy

## 2020-03-24 DIAGNOSIS — R293 Abnormal posture: Secondary | ICD-10-CM

## 2020-03-24 DIAGNOSIS — M4322 Fusion of spine, cervical region: Secondary | ICD-10-CM

## 2020-03-24 DIAGNOSIS — M6281 Muscle weakness (generalized): Secondary | ICD-10-CM

## 2020-03-24 NOTE — Therapy (Signed)
[place], [place], [address] Phone: [phone]   Fax:  [phone]  Physical Therapy Treatment  Patient Details  Name: [name] MRN: [id number] Date of Birth: [birth year] Referring Provider (PT): Gilles Chiquito MD   Encounter Date: 03/24/2020   PT End of Session - 03/24/20 1615    Visit Number 9    Number of Visits 14    Date for PT Re-Evaluation 04/23/20    Authorization Type [employer]    PT Start Time 8527    PT Stop Time 1458    PT Time Calculation (min) 43 min    Activity Tolerance Patient tolerated treatment well    Behavior During Therapy [facility] for tasks assessed/performed           Past Medical History:  Diagnosis Date  . Anemia   . Fibroids, submucosal   . History of blood transfusion 02/2011   [facility] - ? 2-3 units transfused    Past Surgical History:  Procedure Laterality Date  . ANTERIOR CERVICAL DECOMPRESSION/DISCECTOMY FUSION 4 LEVELS N/A 04/26/2019   Procedure: ANTERIOR CERVICAL DECOMPRESSION FUSION CERVICAL 4-5, CERVICAL 5-6, CERVICAL 6-7 WITH INSTRUMENTATION AND ALLOGRAFT;  Surgeon: Phylliss Bob, MD;  Location: [facility];  Service: Orthopedics;  Laterality: N/A;  . APPENDECTOMY    . BILATERAL SALPINGECTOMY Bilateral 04/16/2013   Procedure: BILATERAL SALPINGECTOMY;  Surgeon: Lavonia Drafts, MD;  Location: [facility];  Service: Gynecology;  Laterality: Bilateral;  . [place], 2002  . DILATION AND CURETTAGE OF UTERUS N/A 03/31/2012   Procedure: DILATATION AND CURETTAGE;  Surgeon: Emily Filbert, MD;  Location: [facility];  Service: Gynecology;  Laterality: N/A;  . LAPAROSCOPIC LYSIS OF ADHESIONS N/A 03/31/2012   Procedure: LAPAROSCOPIC LYSIS OF ADHESIONS;  Surgeon: Emily Filbert, MD;  Location: [facility];  Service: Gynecology;  Laterality: N/A;  . LAPAROSCOPY N/A 03/31/2012   Procedure: LAPAROSCOPY OPERATIVE;  Surgeon: Emily Filbert, MD;  Location: [facility];  Service: Gynecology;   Laterality: N/A;  . NOVASURE ABLATION N/A 03/31/2012   Procedure: NOVASURE ABLATION;  Surgeon: Emily Filbert, MD;  Location: [facility];  Service: Gynecology;  Laterality: N/A;  . ROBOTIC ASSISTED TOTAL HYSTERECTOMY N/A 04/16/2013   Procedure: ROBOTIC ASSISTED TOTAL HYSTERECTOMY;  Surgeon: Lavonia Drafts, MD;  Location: [facility];  Service: Gynecology;  Laterality: N/A;  . TUBAL LIGATION  2002  . WISDOM TOOTH EXTRACTION      There were no vitals filed for this visit.   Subjective Assessment - 03/24/20 1424    Subjective Patient continues to report her back is better. She is having very little back pain today. She is having left shoulder and neck pain. She had no headaches over the weekend.    Pertinent History Hysterectomy, 2 cesareans, appendectomy    Limitations Sitting;Lifting;House hold activities    How long can you stand comfortably? Standing for aperiod of time increase pain    How long can you walk comfortably? walking increases the pain    Patient Stated Goals Be able to take control of the pain and continue to heal to 100%    Currently in Pain? Yes    Pain Score 3     Pain Location Shoulder    Pain Orientation Left    Pain Descriptors / Indicators Aching    Pain Type Chronic pain    Pain Onset More than a month ago    Pain Frequency Constant  [place] Adult PT Treatment/Exercise - 03/24/20 0001      Neck Exercises: Standing   Other Standing Exercises shoulder extension x20 green; scap retraction x20 green    Other Standing Exercises cabinet reach 2x10 2lb; ex ball on the wall 2x10;      Neck Exercises: Seated   Other Seated Exercise 1lb wand flexion 2x10      Manual Therapy   Manual Therapy Soft tissue mobilization    Manual therapy comments skilled palpation of trigger points.    Soft tissue mobilization to upper trap and levator            Trigger Point Dry Needling - 03/24/20 0001    Consent Given? Yes    Education  Handout Provided Previously provided    Other Dry Needling 2 sapots to levator left and 2 spots to left upper trap    Upper Trapezius Response Twitch reponse elicited;Palpable increased muscle length    Levator Scapulae Response Twitch response elicited                PT Education - 03/24/20 1615    Education Details HEP and symptom mangement    Person(s) Educated Patient    Methods Explanation;Demonstration;Tactile cues;Verbal cues    Comprehension Verbalized understanding;Returned demonstration;Verbal cues required;Tactile cues required            PT Short Term Goals - 02/13/20 1004      PT SHORT TERM GOAL #1   Title Patient will increase bilateral shoulder flexion strength to 4+/5    Baseline left shoulder still limited    Time 3    Period Weeks    Status On-going      PT SHORT TERM GOAL #2   Title Pt will demonstrates pain free B cervical rotation to >/= 65 degrees.    Baseline right rotation sitll limited    Time 3    Period Weeks    Status On-going      PT SHORT TERM GOAL #3   Title Patient will report a 50% reduction in headache frequency and intensity    Baseline no headaches    Time 3    Period Weeks    Status Achieved             PT Long Term Goals - 02/13/20 1005      PT LONG TERM GOAL #1   Title Patient will report a 75% reduction in headache frequncy and intesity in order to work    Baseline no headaches    Time 6    Period Weeks    Status Achieved      PT LONG TERM GOAL #2   Title Patient will use her arms at work without increased headaches or pain in ipper traps and arms    Baseline imprpivng motion in all planes    Time 6    Period Weeks    Status On-going      PT LONG TERM GOAL #3   Title Pt will have no occurrence of headaches/ability to prevent onset of HA with HEP performance.    Baseline working on her exercises    Time 6    Period Weeks    Status On-going      PT LONG TERM GOAL #4   Title Pt will return to work full  time with no c/o limitation from pain.    Baseline mild pain at work    Time 8    Period Weeks    Status  On-going                 Plan - 03/24/20 1616    Clinical Impression Statement therapy focused on needling and manaul therapy to her levator, She reported feeling much looser afterads. We alos focused on overhead strengthening. She reported some paoin with overhead strengthening. She was advised she needs to continue this at home. 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 She needs to get stronger with these activity's. We will continue to progress as tolerated.    Personal Factors and Comorbidities Age;Time since onset of injury/illness/exacerbation;Past/Current Experience;Profession    Comorbidities Anemia    Examination-Activity Limitations Sit;Sleep;Carry;Lift    Examination-Participation Restrictions Driving;Community Activity;Shop;Cleaning;Laundry    Stability/Clinical Decision Making Stable/Uncomplicated    Clinical Decision Making Low    Rehab Potential Good    PT Frequency 1x / week    PT Duration 8 weeks    PT Treatment/Interventions Spinal Manipulations;Joint Manipulations;Taping;Dry needling;Passive range of motion;Manual techniques;Patient/family education;Therapeutic exercise;Neuromuscular re-education;Therapeutic activities;Functional mobility training;Traction;Moist Heat;Iontophoresis 4mg /ml Dexamethasone;ADLs/Self Care Home Management;Cryotherapy;Electrical Stimulation    PT Next Visit Plan Assess HEP and progress PRN, response to DN with e-stom and manual for left upper trap/levator/suboccipitals, postural strengthening    PT Home Exercise Plan [id number], postureal education    Consulted and Agree with Plan of Care Patient           Patient will benefit from skilled therapeutic intervention in order to improve the following deficits and impairments:  Pain,Postural dysfunction,Impaired flexibility,Increased fascial restricitons,Decreased strength,Decreased activity  tolerance,Improper body mechanics,Impaired perceived functional ability,Hypomobility  Visit Diagnosis: Abnormal posture  Muscle weakness (generalized)  Fusion of spine, cervical region     Problem List Patient Active Problem List   Diagnosis Date Noted  . Right low back pain 02/25/2020  . Viral conjunctivitis 02/14/2020  . Pernicious anemia 01/02/2020  . Vitamin D deficiency 12/31/2019  . Hashimoto's thyroiditis 12/31/2019  . Muscle pain 12/19/2019  . Anxiety 12/19/2019  . Radiculopathy 04/26/2019  . Cervical myelopathy ([place]) 03/07/2019  . Migraine 02/19/2019  . Shoulder pain, left 12/07/2018  . Iron deficiency anemia 11/07/2018  . Elevated blood pressure reading 11/07/2018  . Preventative health care 11/07/2018  . Postoperative state 04/16/2013  . Hematometra 01/09/2013  . Menorrhagia 01/09/2013  . Chronic pelvic pain in female 01/09/2013    Carney Living PT DPT  03/24/2020, 4:33 PM  [facility] [address] [place], [place], [address] Phone: [phone]   Fax:  [phone]  Name: JOLINA SYMONDS MRN: [id number] Date of Birth: [birth year]

## 2020-04-15 ENCOUNTER — Other Ambulatory Visit: Payer: Self-pay

## 2020-04-15 ENCOUNTER — Emergency Department (HOSPITAL_COMMUNITY)
Admission: EM | Admit: 2020-04-15 | Discharge: 2020-04-15 | Disposition: A | Discharge status: Home / Self Care | Payer: Medicaid Other | Attending: Emergency Medicine | Admitting: Emergency Medicine

## 2020-04-15 ENCOUNTER — Encounter (HOSPITAL_COMMUNITY): Payer: Self-pay | Admitting: Emergency Medicine

## 2020-04-15 DIAGNOSIS — G43009 Migraine without aura, not intractable, without status migrainosus: Secondary | ICD-10-CM | POA: Insufficient documentation

## 2020-04-15 DIAGNOSIS — Z87891 Personal history of nicotine dependence: Secondary | ICD-10-CM | POA: Diagnosis not present

## 2020-04-15 DIAGNOSIS — G43019 Migraine without aura, intractable, without status migrainosus: Secondary | ICD-10-CM | POA: Diagnosis not present

## 2020-04-15 DIAGNOSIS — R519 Headache, unspecified: Secondary | ICD-10-CM | POA: Diagnosis present

## 2020-04-15 MED ORDER — METOCLOPRAMIDE HCL 10 MG PO TABS
10.0000 mg | ORAL_TABLET | Freq: Once | ORAL | Status: AC
  Administered 2020-04-15: 10 mg via ORAL
  Filled 2020-04-15: qty 1

## 2020-04-15 MED ORDER — KETOROLAC TROMETHAMINE 30 MG/ML IJ SOLN
30.0000 mg | Freq: Once | INTRAMUSCULAR | Status: AC
  Administered 2020-04-15: 30 mg via INTRAMUSCULAR
  Filled 2020-04-15: qty 1

## 2020-04-15 MED ORDER — DIPHENHYDRAMINE HCL 25 MG PO CAPS
50.0000 mg | ORAL_CAPSULE | Freq: Once | ORAL | Status: AC
  Administered 2020-04-15: 50 mg via ORAL
  Filled 2020-04-15: qty 2

## 2020-04-15 MED ORDER — DEXAMETHASONE SODIUM PHOSPHATE 10 MG/ML IJ SOLN
10.0000 mg | Freq: Once | INTRAMUSCULAR | Status: AC
  Administered 2020-04-15: 10 mg via INTRAMUSCULAR
  Filled 2020-04-15: qty 1

## 2020-04-15 NOTE — Discharge Instructions (Signed)
Follow up with your neurologist. Home to rest. Return to the ER for worsening or concerning symptoms.

## 2020-04-15 NOTE — ED Triage Notes (Signed)
Patient reports headache onset Saturday last week , denies head injury , no blurred vision or photophobia , denies emesis or fever.

## 2020-04-15 NOTE — ED Provider Notes (Signed)
[facility] EMERGENCY DEPARTMENT Provider Note   CSN: [id number] Arrival date & time: 04/15/20  0133     History Chief Complaint  Patient presents with  . Headache    [name] is a 50 y.o. female.  50 year old female presents with complaint of migraine headache. Onset 3 days ago, located right side of head and face, sharp and aching in nature, gradual onset, with photophobia and nausea. Denies fevers, vomiting, visual disturbance, changes in speech or gait. Last similar headache was 1.5 months ago. Tried Excedrin with slight relief initially, now not helping. Has had a head CT in the past 5 years, normal. Headaches started after c-spine fusion, has been seen by neurology.         Past Medical History:  Diagnosis Date  . Anemia   . Fibroids, submucosal   . History of blood transfusion 02/2011   [facility] - ? 2-3 units transfused    Patient Active Problem List   Diagnosis Date Noted  . Right low back pain 02/25/2020  . Viral conjunctivitis 02/14/2020  . Pernicious anemia 01/02/2020  . Vitamin D deficiency 12/31/2019  . Hashimoto's thyroiditis 12/31/2019  . Muscle pain 12/19/2019  . Anxiety 12/19/2019  . Radiculopathy 04/26/2019  . Cervical myelopathy ([place]) 03/07/2019  . Migraine 02/19/2019  . Shoulder pain, left 12/07/2018  . Iron deficiency anemia 11/07/2018  . Elevated blood pressure reading 11/07/2018  . Preventative health care 11/07/2018  . Postoperative state 04/16/2013  . Hematometra 01/09/2013  . Menorrhagia 01/09/2013  . Chronic pelvic pain in female 01/09/2013    Past Surgical History:  Procedure Laterality Date  . ANTERIOR CERVICAL DECOMPRESSION/DISCECTOMY FUSION 4 LEVELS N/A 04/26/2019   Procedure: ANTERIOR CERVICAL DECOMPRESSION FUSION CERVICAL 4-5, CERVICAL 5-6, CERVICAL 6-7 WITH INSTRUMENTATION AND ALLOGRAFT;  Surgeon: Phylliss Bob, MD;  Location: [facility];  Service: Orthopedics;  Laterality: N/A;  . APPENDECTOMY    . BILATERAL  SALPINGECTOMY Bilateral 04/16/2013   Procedure: BILATERAL SALPINGECTOMY;  Surgeon: Lavonia Drafts, MD;  Location: [facility];  Service: Gynecology;  Laterality: Bilateral;  . [place], 2002  . DILATION AND CURETTAGE OF UTERUS N/A 03/31/2012   Procedure: DILATATION AND CURETTAGE;  Surgeon: Emily Filbert, MD;  Location: [facility];  Service: Gynecology;  Laterality: N/A;  . LAPAROSCOPIC LYSIS OF ADHESIONS N/A 03/31/2012   Procedure: LAPAROSCOPIC LYSIS OF ADHESIONS;  Surgeon: Emily Filbert, MD;  Location: [facility];  Service: Gynecology;  Laterality: N/A;  . LAPAROSCOPY N/A 03/31/2012   Procedure: LAPAROSCOPY OPERATIVE;  Surgeon: Emily Filbert, MD;  Location: [facility];  Service: Gynecology;  Laterality: N/A;  . NOVASURE ABLATION N/A 03/31/2012   Procedure: NOVASURE ABLATION;  Surgeon: Emily Filbert, MD;  Location: [facility];  Service: Gynecology;  Laterality: N/A;  . ROBOTIC ASSISTED TOTAL HYSTERECTOMY N/A 04/16/2013   Procedure: ROBOTIC ASSISTED TOTAL HYSTERECTOMY;  Surgeon: Lavonia Drafts, MD;  Location: [facility];  Service: Gynecology;  Laterality: N/A;  . TUBAL LIGATION  2002  . WISDOM TOOTH EXTRACTION       OB History    Gravida  3   Para  2   Term  2   Preterm      AB  1   Living  2     SAB  1   IAB      Ectopic      Multiple      Live Births              Family History  Problem Relation Age of Onset  . Hypertension Mother   . Seizures Father   . Fibroids Sister     Social History   Tobacco Use  . Smoking status: Former Smoker    Packs/day: 0.00    Types: Cigarettes    Quit date: 01/21/2011    Years since quitting: 9.2  . Smokeless tobacco: Never Used  Vaping Use  . Vaping Use: Never used  Substance Use Topics  . Alcohol use: Yes    Comment: social occasions  . Drug use: No    Home Medications Prior to Admission medications   Medication Sig Start Date End Date Taking? Authorizing Provider  acetaminophen (TYLENOL) 500 MG tablet Take 2 tablets  (1,000 mg total) by mouth every 6 (six) hours as needed for moderate pain or headache. 02/25/20   Cato Mulligan, MD  Acetaminophen-Caffeine 500-65 MG TABS Take 1 tablet by mouth every 6 (six) hours as needed. 07/02/19   Maudie Mercury, MD  Cholecalciferol 25 MCG (1000 UT) capsule Take 1 capsule (1,000 Units total) by mouth daily. 03/03/20   Cato Mulligan, MD  ferrous sulfate 325 (65 FE) MG tablet Take 1 tablet (325 mg total) by mouth every other day. 02/25/20   Cato Mulligan, MD  levothyroxine (SYNTHROID) 50 MCG tablet Take 1 tablet (50 mcg total) by mouth daily. 03/04/20 05/03/20  Cato Mulligan, MD  meloxicam (MOBIC) 7.5 MG tablet Take 1 tablet (7.5 mg total) by mouth daily. 02/22/20   Zigmund Gottron, NP  tiZANidine (ZANAFLEX) 2 MG tablet Take 1-2 tablets (2-4 mg total) by mouth at bedtime. 02/22/20   Zigmund Gottron, NP  azelastine (ASTELIN) 0.1 % nasal spray Place 1 spray into both nostrils 2 (two) times daily. Use in each nostril as directed Patient not taking: Reported on 03/05/2019 12/01/17 12/24/19  Shella Maxim, NP  DULoxetine (CYMBALTA) 30 MG capsule Take 1 capsule (30 mg total) by mouth daily. 12/19/19 12/24/19  Seawell, Jaimie A, DO  gabapentin (NEURONTIN) 300 MG capsule TAKE 1 CAPSULE(300 MG) BY MOUTH TWICE DAILY Patient not taking: Reported on 08/14/2019 05/23/19 12/24/19  Maudie Mercury, MD    Allergies    Bee venom, Bactrim [sulfamethoxazole-trimethoprim], and Codeine  Review of Systems   Review of Systems  Constitutional: Negative for fever.  Eyes: Negative for visual disturbance.  Gastrointestinal: Positive for nausea. Negative for vomiting.  Musculoskeletal: Negative for arthralgias, gait problem and myalgias.  Skin: Negative for rash and wound.  Allergic/Immunologic: Negative for immunocompromised state.  Neurological: Positive for headaches. Negative for syncope, facial asymmetry, weakness and numbness.  Hematological: Negative for adenopathy.  Psychiatric/Behavioral:  Negative for confusion.  All other systems reviewed and are negative.   Physical Exam Updated Vital Signs BP 135/73   Pulse 63   Temp 98.1 F (36.7 C) (Oral)   Resp 16   Ht 5\' 5"  (1.651 m)   Wt 115 kg   LMP 03/31/2013 (Exact Date)   SpO2 100%   BMI 42.19 kg/m   Physical Exam Vitals and nursing note reviewed.  Constitutional:      General: She is not in acute distress.    Appearance: She is well-developed. She is not diaphoretic.  HENT:     Head: Normocephalic and atraumatic.     Mouth/Throat:     Mouth: Mucous membranes are moist.  Eyes:     General: No visual field deficit.    Extraocular Movements: Extraocular movements intact.     Right eye: Normal extraocular motion.  Left eye: Normal extraocular motion.     Pupils: Pupils are equal, round, and reactive to light.  Pulmonary:     Effort: Pulmonary effort is normal.  Musculoskeletal:        General: Normal range of motion.     Cervical back: Normal range of motion and neck supple.  Skin:    General: Skin is warm and dry.  Neurological:     Mental Status: She is alert and oriented to person, place, and time.     Cranial Nerves: No cranial nerve deficit, dysarthria or facial asymmetry.     Sensory: No sensory deficit.     Motor: No weakness.     Gait: Gait normal.  Psychiatric:        Behavior: Behavior normal.     ED Results / Procedures / Treatments   Labs (all labs ordered are listed, but only abnormal results are displayed) Labs Reviewed - No data to display  EKG None  Radiology No results found.  Procedures Procedures   Medications Ordered in ED Medications  ketorolac (TORADOL) 30 MG/ML injection 30 mg (has no administration in time range)  dexamethasone (DECADRON) injection 10 mg (has no administration in time range)  metoCLOPramide (REGLAN) tablet 10 mg (has no administration in time range)  diphenhydrAMINE (BENADRYL) capsule 50 mg (has no administration in time range)    ED Course   I have reviewed the triage vital signs and the nursing notes.  Pertinent labs & imaging results that were available during my care of the patient were reviewed by me and considered in my medical decision making (see chart for details).  Clinical Course as of 04/15/20 0654  Tue Mar 29, 874  7632 50 year old female with complaint of migraine, similar to prior, no new or different symptoms, gradual onset. Exam is unremarkable, given migraine cocktail with plan to go home and rest, follow up with her neurologist and return to ER as needed.  [LM]    Clinical Course User Index [LM] Roque Lias   MDM Rules/Calculators/A&P                          Final Clinical Impression(s) / ED Diagnoses Final diagnoses:  Migraine without aura and without status migrainosus, not intractable    Rx / DC Orders ED Discharge Orders    None       Tacy Learn, PA-C 04/15/20 6270    Veryl Speak, MD 04/15/20 2257

## 2020-04-16 ENCOUNTER — Telehealth: Payer: Self-pay

## 2020-04-16 ENCOUNTER — Encounter: Payer: Self-pay | Admitting: Physical Therapy

## 2020-04-16 ENCOUNTER — Ambulatory Visit: Payer: Medicaid Other | Admitting: Physical Therapy

## 2020-04-16 DIAGNOSIS — R293 Abnormal posture: Secondary | ICD-10-CM | POA: Diagnosis not present

## 2020-04-16 DIAGNOSIS — M6281 Muscle weakness (generalized): Secondary | ICD-10-CM

## 2020-04-16 DIAGNOSIS — M4322 Fusion of spine, cervical region: Secondary | ICD-10-CM

## 2020-04-16 NOTE — Telephone Encounter (Signed)
Transition Care Management Follow-up Telephone Call  Date of discharge and from where: 04/15/2020 from [facility]  How have you been since you were released from the hospital? Pt stated that she is some what better but she is still experiencing a migraine on the right side of her head.   Any questions or concerns? No  Items Reviewed:  Did the pt receive and understand the discharge instructions provided? Yes   Medications obtained and verified? Yes   Other? No   Any new allergies since your discharge? No   Dietary orders reviewed? n/a  Do you have support at home? Yes   Functional Questionnaire: (I = Independent and D = Dependent) ADLs: I  Bathing/Dressing- I  Meal Prep- I  Eating- I  Maintaining continence- I  Transferring/Ambulation- I  Managing Meds- I  Follow up appointments reviewed:   PCP Hospital f/u appt confirmed? No    Specialist Hospital f/u appt confirmed? Yes  Scheduled to see Neurology outside of [facility] @ 04/21/2020.  Are transportation arrangements needed? No   If their condition worsens, is the pt aware to call PCP or go to the Emergency Dept.? Yes  Was the patient provided with contact information for the PCP's office or ED? Yes  Was to pt encouraged to call back with questions or concerns? Yes

## 2020-04-16 NOTE — Therapy (Addendum)
[facility] [address] [place], [place], [address] Phone: [phone]   Fax:  [phone]  Physical Therapy Treatment/Dischagre   Patient Details  Name: [name] MRN: [id number] Date of Birth: 11/18/1970 Referring Provider (PT): Emily Mullen MD   Encounter Date: 04/16/2020   PT End of Session - 04/16/20 1455    Visit Number 10    Number of Visits 14    Date for PT Re-Evaluation 04/23/20    Authorization Type [employer]    PT Start Time 1100    PT Stop Time 1147    PT Time Calculation (min) 47 min    Activity Tolerance Patient tolerated treatment well    Behavior During Therapy WFL for tasks assessed/performed           Past Medical History:  Diagnosis Date  . Anemia   . Fibroids, submucosal   . History of blood transfusion 02/2011   [facility] - ? 2-3 units transfused    Past Surgical History:  Procedure Laterality Date  . ANTERIOR CERVICAL DECOMPRESSION/DISCECTOMY FUSION 4 LEVELS N/A 04/26/2019   Procedure: ANTERIOR CERVICAL DECOMPRESSION FUSION CERVICAL 4-5, CERVICAL 5-6, CERVICAL 6-7 WITH INSTRUMENTATION AND ALLOGRAFT;  Surgeon: Dumonski, Mark, MD;  Location: [facility];  Service: Orthopedics;  Laterality: N/A;  . APPENDECTOMY    . BILATERAL SALPINGECTOMY Bilateral 04/16/2013   Procedure: BILATERAL SALPINGECTOMY;  Surgeon: Carolyn Harraway-Smith, MD;  Location: [facility];  Service: Gynecology;  Laterality: Bilateral;  . CESAREAN SECTION  1994, 2002  . DILATION AND CURETTAGE OF UTERUS N/A 03/31/2012   Procedure: DILATATION AND CURETTAGE;  Surgeon: Myra C Dove, MD;  Location: [facility];  Service: Gynecology;  Laterality: N/A;  . LAPAROSCOPIC LYSIS OF ADHESIONS N/A 03/31/2012   Procedure: LAPAROSCOPIC LYSIS OF ADHESIONS;  Surgeon: Myra C Dove, MD;  Location: [facility];  Service: Gynecology;  Laterality: N/A;  . LAPAROSCOPY N/A 03/31/2012   Procedure: LAPAROSCOPY OPERATIVE;  Surgeon: Myra C Dove, MD;  Location: [facility];  Service:  Gynecology;  Laterality: N/A;  . NOVASURE ABLATION N/A 03/31/2012   Procedure: NOVASURE ABLATION;  Surgeon: Myra C Dove, MD;  Location: [facility];  Service: Gynecology;  Laterality: N/A;  . ROBOTIC ASSISTED TOTAL HYSTERECTOMY N/A 04/16/2013   Procedure: ROBOTIC ASSISTED TOTAL HYSTERECTOMY;  Surgeon: Carolyn Harraway-Smith, MD;  Location: [facility];  Service: Gynecology;  Laterality: N/A;  . TUBAL LIGATION  2002  . WISDOM TOOTH EXTRACTION      There were no vitals filed for this visit.   Subjective Assessment - 04/16/20 1156    Subjective Patient has had a migrane since Saturday. It has been very bad. She went to the EDz yesterday. It hasn't improved. She has not slept in several days.    Pertinent History Hysterectomy, 2 cesareans, appendectomy    Limitations Sitting;Lifting;House hold activities    How long can you stand comfortably? Standing for aperiod of time increase pain    How long can you walk comfortably? walking increases the pain    Patient Stated Goals Be able to take control of the pain and continue to heal to 100%    Currently in Pain? Yes    Pain Score 9     Pain Location Head    Pain Orientation Left;Right    Pain Descriptors / Indicators Aching    Pain Type Acute pain    Pain Onset More than a month ago    Pain Frequency Constant    Aggravating Factors  standing and walking      Pain Relieving Factors tylenol    Effect of Pain on Daily Activities hurts to move his head    Multiple Pain Sites No                             [facility] Adult PT Treatment/Exercise - 04/16/20 0001      Modalities   Modalities Electrical Stimulation;Moist Heat      Manual Therapy   Manual therapy comments skilled palpation of trigger points.    Soft tissue mobilization to upper trap and levator; sub-occiptial release.    Manual Traction sub-occipital release            Trigger Point Dry Needling - 04/16/20 0001    Muscles Treated Head and Neck Suboccipitals    Other Dry  Needling 2 spots in sub-occipitals; 2 spots in cervical parapsinals bilateral 2 spots in each upper trap    Upper Trapezius Response Twitch reponse elicited;Palpable increased muscle length    Suboccipitals Response Twitch response elicited;Palpable increased muscle length    Cervical multifidi Response Palpable increased muscle length;Twitch reponse elicited                PT Education - 04/16/20 1455    Education Details reviewed self soft tidssue mobilization    Person(s) Educated Patient    Methods Demonstration;Explanation;Tactile cues;Verbal cues    Comprehension Verbalized understanding;Returned demonstration;Verbal cues required;Tactile cues required            PT Short Term Goals - 02/13/20 1004      PT SHORT TERM GOAL #1   Title Patient will increase bilateral shoulder flexion strength to 4+/5    Baseline left shoulder still limited    Time 3    Period Weeks    Status On-going      PT SHORT TERM GOAL #2   Title Pt will demonstrates pain free B cervical rotation to >/= 65 degrees.    Baseline right rotation sitll limited    Time 3    Period Weeks    Status On-going      PT SHORT TERM GOAL #3   Title Patient will report a 50% reduction in headache frequency and intensity    Baseline no headaches    Time 3    Period Weeks    Status Achieved             PT Long Term Goals - 02/13/20 1005      PT LONG TERM GOAL #1   Title Patient will report a 75% reduction in headache frequncy and intesity in order to work    Baseline no headaches    Time 6    Period Weeks    Status Achieved      PT LONG TERM GOAL #2   Title Patient will use her arms at work without increased headaches or pain in ipper traps and arms    Baseline imprpivng motion in all planes    Time 6    Period Weeks    Status On-going      PT LONG TERM GOAL #3   Title Pt will have no occurrence of headaches/ability to prevent onset of HA with HEP performance.    Baseline working on her  exercises    Time 6    Period Weeks    Status On-going      PT LONG TERM GOAL #4   Title Pt will return to work full time with no   c/o limitation from pain.    Baseline mild pain at work    Time 8    Period Weeks    Status On-going                 Plan - 04/16/20 1456    Clinical Impression Statement Patient reported a minor improvement in her headache after her treatment. She was advised if it continues to be severe to call her MD. DShe tolerated needling well. Therapy also put her on e-stim and heat.    Personal Factors and Comorbidities Age;Time since onset of injury/illness/exacerbation;Past/Current Experience;Profession    Examination-Activity Limitations Sit;Sleep;Carry;Lift    Examination-Participation Restrictions Driving;Community Activity;Shop;Cleaning;Laundry    Stability/Clinical Decision Making Stable/Uncomplicated    Clinical Decision Making Low    Rehab Potential Good    PT Frequency 1x / week    PT Duration 8 weeks    PT Treatment/Interventions Spinal Manipulations;Joint Manipulations;Taping;Dry needling;Passive range of motion;Manual techniques;Patient/family education;Therapeutic exercise;Neuromuscular re-education;Therapeutic activities;Functional mobility training;Traction;Moist Heat;Iontophoresis 34m/ml Dexamethasone;ADLs/Self Care Home Management;Cryotherapy;Electrical Stimulation    PT Next Visit Plan Assess HEP and progress PRN, response to DN with e-stom and manual for left upper trap/levator/suboccipitals, postural strengthening    PT Home Exercise Plan [email or web address], postureal education    Consulted and Agree with Plan of Care Patient           Patient will benefit from skilled therapeutic intervention in order to improve the following deficits and impairments:  Pain,Postural dysfunction,Impaired flexibility,Increased fascial restricitons,Decreased strength,Decreased activity tolerance,Improper body mechanics,Impaired perceived functional  ability,Hypomobility  Visit Diagnosis: Abnormal posture  Muscle weakness (generalized)  Fusion of spine, cervical region  PHYSICAL THERAPY DISCHARGE SUMMARY  Visits from Start of Care: 10  Current functional level related to goals / functional outcomes: Improved pain when needling performed    Remaining deficits: Overall pain returned in the time between sessions   Education / Equipment: HEP   Plan: Patient agrees to discharge.  Patient goals were not met. Patient is being discharged due to the patient's request.  ?????       Problem List Patient Active Problem List   Diagnosis Date Noted  . Right low back pain 02/25/2020  . Viral conjunctivitis 02/14/2020  . Pernicious anemia 01/02/2020  . Vitamin D deficiency 12/31/2019  . Hashimoto's thyroiditis 12/31/2019  . Muscle pain 12/19/2019  . Anxiety 12/19/2019  . Radiculopathy 04/26/2019  . [place] 03/07/[place]  . Migraine 02/19/2019  . Shoulder pain, left 12/07/2018  . Iron deficiency anemia 11/07/2018  . Elevated blood pressure reading 11/07/2018  . Preventative health care 11/07/2018  . Postoperative state 04/16/2013  . Hematometra 01/09/2013  . Menorrhagia 01/09/2013  . Chronic pelvic pain in female 01/09/2013    DCarney LivingPT DPT  04/16/2020, 2:59 PM  [facility] [place] [address]Phone: [phone]  Fax:  [phone] Name: Kimberly HULSEYMRN: [id number] of Birth: [birth year]

## 2020-04-16 NOTE — Telephone Encounter (Signed)
Transition Care Management Unsuccessful Follow-up Telephone Call  Date of discharge and from where:  04/15/2020 from [facility]  Attempts:  1st Attempt  Reason for unsuccessful TCM follow-up call:  Left voice message

## 2020-04-28 DIAGNOSIS — M542 Cervicalgia: Secondary | ICD-10-CM | POA: Diagnosis not present

## 2020-04-28 DIAGNOSIS — G43009 Migraine without aura, not intractable, without status migrainosus: Secondary | ICD-10-CM | POA: Diagnosis not present

## 2020-05-05 ENCOUNTER — Ambulatory Visit: Payer: Medicaid Other | Attending: Internal Medicine | Admitting: Physical Therapy

## 2020-05-05 ENCOUNTER — Telehealth: Payer: Self-pay | Admitting: Physical Therapy

## 2020-05-05 NOTE — Telephone Encounter (Signed)
Patient contacted regarding no-show appointment. Patient reports she did not know she had an appointment. She will be transferring to a different clinic but would like to continue working with [place] until the transfer is complete. We will see the patient 1-2 more times.

## 2020-05-07 ENCOUNTER — Other Ambulatory Visit: Payer: Self-pay

## 2020-05-07 ENCOUNTER — Emergency Department (HOSPITAL_COMMUNITY)
Admission: EM | Admit: 2020-05-07 | Discharge: 2020-05-08 | Disposition: A | Discharge status: Home / Self Care | Payer: Medicaid Other | Attending: Emergency Medicine | Admitting: Emergency Medicine

## 2020-05-07 ENCOUNTER — Encounter (HOSPITAL_COMMUNITY): Payer: Self-pay

## 2020-05-07 ENCOUNTER — Emergency Department (HOSPITAL_COMMUNITY): Payer: Medicaid Other

## 2020-05-07 DIAGNOSIS — R079 Chest pain, unspecified: Secondary | ICD-10-CM | POA: Insufficient documentation

## 2020-05-07 DIAGNOSIS — Z87891 Personal history of nicotine dependence: Secondary | ICD-10-CM | POA: Insufficient documentation

## 2020-05-07 DIAGNOSIS — R0789 Other chest pain: Secondary | ICD-10-CM | POA: Diagnosis not present

## 2020-05-07 DIAGNOSIS — R110 Nausea: Secondary | ICD-10-CM | POA: Diagnosis not present

## 2020-05-07 LAB — CBC
HCT: 38.3 % (ref 36.0–46.0)
Hemoglobin: 12.4 g/dL (ref 12.0–15.0)
MCH: 26.2 pg (ref 26.0–34.0)
MCHC: 32.4 g/dL (ref 30.0–36.0)
MCV: 80.8 fL (ref 80.0–100.0)
Platelets: 323 10*3/uL (ref 150–400)
RBC: 4.74 MIL/uL (ref 3.87–5.11)
RDW: 12.9 % (ref 11.5–15.5)
WBC: 9.9 10*3/uL (ref 4.0–10.5)
nRBC: 0 % (ref 0.0–0.2)

## 2020-05-07 LAB — TROPONIN I (HIGH SENSITIVITY)
Troponin I (High Sensitivity): <2 ng/L (ref <18)
Troponin I (High Sensitivity): 3 ng/L (ref <18)

## 2020-05-07 LAB — I-STAT BETA HCG BLOOD, ED (MC, WL, AP ONLY): I-stat hCG, quantitative: <5 m[IU]/mL (ref <5)

## 2020-05-07 LAB — BASIC METABOLIC PANEL
Anion gap: 6 (ref 5–15)
BUN: 19 mg/dL (ref 6–20)
CO2: 23 mmol/L (ref 22–32)
Calcium: 8.7 mg/dL — ABNORMAL LOW (ref 8.9–10.3)
Chloride: 106 mmol/L (ref 98–111)
Creatinine, Ser: 0.62 mg/dL (ref 0.44–1.00)
GFR, Estimated: >60 mL/min (ref >60)
Glucose, Bld: 95 mg/dL (ref 70–99)
Potassium: 3.9 mmol/L (ref 3.5–5.1)
Sodium: 135 mmol/L (ref 135–145)

## 2020-05-07 NOTE — ED Triage Notes (Signed)
Generalized chest pain started around 1pm with tingling to right arm. Denies shortness of breath, vomiting. Reports mild nausea.  No cardiac hx.

## 2020-05-07 NOTE — ED Triage Notes (Signed)
Emergency Medicine Provider Triage Evaluation Note  [name] , a 50 y.o. female  was evaluated in triage.  Pt complains of chest pain.  Symptoms started at 1 PM, generalized pain across her chest that is intermittent, she tried laying down at home for a while symptoms did not no associated shortness of breath, nausea, vomiting abdominal pain.  Questions whether she may be having some anxiety.  Got worried when she was having some tingling in her right arm.  No cardiac history  Review of Systems  Positive: Chest pain Negative: Abdominal pain, nausea, vomiting, shortness of breath  Physical Exam  BP 126/68 (BP Location: Right Arm)   Pulse 76   Temp 98.7 F (37.1 C) (Oral)   Resp 15   Ht 5\' 5"  (1.651 m)   Wt 115 kg   LMP 03/31/2013 (Exact Date)   SpO2 100%   BMI 42.19 kg/m  Gen:   Awake, no distress   HEENT:  Atraumatic  Resp:  Normal effort  Cardiac:  Normal rate  Abd:   Nondistended, MSK:   Moves extremities without difficulty  Neuro:  Speech clear   Medical Decision Making  Medically screening exam initiated at 8:25 PM.  Appropriate orders placed.  [name] was informed that the remainder of the evaluation will be completed by another provider, this initial triage assessment does not replace that evaluation, and the importance of remaining in the ED until their evaluation is complete.  Clinical Impression  1. Chest Pain   Jacqlyn Larsen, [place] 05/07/20 2027

## 2020-05-08 MED ORDER — SUCRALFATE 1 G PO TABS: 1.0000 g | ORAL_TABLET | Freq: Four times a day (QID) | ORAL | 0 refills | Status: DC | PRN

## 2020-05-08 MED ORDER — ALUM & MAG HYDROXIDE-SIMETH 200-200-20 MG/5ML PO SUSP
30.0000 mL | Freq: Once | ORAL | Status: AC
  Administered 2020-05-08: 30 mL via ORAL
  Filled 2020-05-08: qty 30

## 2020-05-08 NOTE — ED Provider Notes (Signed)
[facility] Emergency Department Provider Note MRN:  [id number]  Arrival date & time: 05/08/20     Chief Complaint   Chest Pain   History of Present Illness   [name] is a 50 y.o. year-old female with a history of no pertinent past medical history presenting to the ED with chief complaint of chest pain.  Location: Migrates throughout the left, right, upper, lower chest Duration: 12 hours Onset: Gradual Timing: Intermittent Description: Sharp Severity: Mild to moderate Exacerbating/Alleviating Factors: Began after eating some [place] fries Associated Symptoms: Denies dizziness or diaphoresis, no nausea vomiting, no shortness of breath, no leg pain or swelling   Review of Systems  A complete 10 system review of systems was obtained and all systems are negative except as noted in the HPI and PMH.   Patient's Health History    Past Medical History:  Diagnosis Date  . Anemia   . Fibroids, submucosal   . History of blood transfusion 02/2011   [facility] - ? 2-3 units transfused    Past Surgical History:  Procedure Laterality Date  . ANTERIOR CERVICAL DECOMPRESSION/DISCECTOMY FUSION 4 LEVELS N/A 04/26/2019   Procedure: ANTERIOR CERVICAL DECOMPRESSION FUSION CERVICAL 4-5, CERVICAL 5-6, CERVICAL 6-7 WITH INSTRUMENTATION AND ALLOGRAFT;  Surgeon: Phylliss Bob, MD;  Location: [place];  Service: Orthopedics;  Laterality: N/A;  . APPENDECTOMY    . BILATERAL SALPINGECTOMY Bilateral 04/16/2013   Procedure: BILATERAL SALPINGECTOMY;  Surgeon: Lavonia Drafts, MD;  Location: [facility];  Service: Gynecology;  Laterality: Bilateral;  . [place], 2002  . DILATION AND CURETTAGE OF UTERUS N/A 03/31/2012   Procedure: DILATATION AND CURETTAGE;  Surgeon: Emily Filbert, MD;  Location: [facility];  Service: Gynecology;  Laterality: N/A;  . LAPAROSCOPIC LYSIS OF ADHESIONS N/A 03/31/2012   Procedure: LAPAROSCOPIC LYSIS OF ADHESIONS;  Surgeon: Emily Filbert, MD;  Location:  [facility] [facility];  Service: Gynecology;  Laterality: N/A;  . LAPAROSCOPY N/A 03/31/2012   Procedure: LAPAROSCOPY OPERATIVE;  Surgeon: Emily Filbert, MD;  Location: [facility];  Service: Gynecology;  Laterality: N/A;  . NOVASURE ABLATION N/A 03/31/2012   Procedure: NOVASURE ABLATION;  Surgeon: Emily Filbert, MD;  Location: [facility];  Service: Gynecology;  Laterality: N/A;  . ROBOTIC ASSISTED TOTAL HYSTERECTOMY N/A 04/16/2013   Procedure: ROBOTIC ASSISTED TOTAL HYSTERECTOMY;  Surgeon: Lavonia Drafts, MD;  Location: [facility];  Service: Gynecology;  Laterality: N/A;  . TUBAL LIGATION  2002  . WISDOM TOOTH EXTRACTION      Family History  Problem Relation Age of Onset  . Hypertension Mother   . Seizures Father   . Fibroids Sister     Social History   Socioeconomic History  . Marital status: Single    Spouse name: Not on file  . Number of children: Not on file  . Years of education: Not on file  . Highest education level: Not on file  Occupational History  . Not on file  Tobacco Use  . Smoking status: Former Smoker    Packs/day: 0.00    Types: Cigarettes    Quit date: 01/21/2011    Years since quitting: 9.3  . Smokeless tobacco: Never Used  Vaping Use  . Vaping Use: Never used  Substance and Sexual Activity  . Alcohol use: Yes    Comment: social occasions  . Drug use: No  . Sexual activity: Not Currently    Birth control/protection: Condom, Surgical  Other Topics Concern  . Not on file  Social History  Narrative  . Not on file   Social Determinants of Health   Financial Resource Strain: Not on file  Food Insecurity: Not on file  Transportation Needs: Not on file  Physical Activity: Not on file  Stress: Not on file  Social Connections: Not on file  Intimate Partner Violence: Not on file     Physical Exam   Vitals:   05/07/20 2016  BP: 126/68  Pulse: 76  Resp: 15  Temp: 98.7 F (37.1 C)  SpO2: 100%    CONSTITUTIONAL: Well-appearing, NAD NEURO:  Alert and oriented x 3, no focal  deficits EYES:  eyes equal and reactive ENT/NECK:  no LAD, no JVD CARDIO: Regular rate, well-perfused, normal S1 and S2 PULM:  CTAB no wheezing or rhonchi GI/GU:  normal bowel sounds, non-distended, non-tender MSK/SPINE:  No gross deformities, no edema SKIN:  no rash, atraumatic PSYCH:  Appropriate speech and behavior  *Additional and/or pertinent findings included in MDM below  Diagnostic and Interventional Summary    EKG Interpretation  Date/Time:  Wednesday May 07 2020 20:31:15 EDT Ventricular Rate:  66 PR Interval:  148 QRS Duration: 76 QT Interval:  410 QTC Calculation: 429 R Axis:   49 Text Interpretation: Normal sinus rhythm Normal ECG Confirmed by Gerlene Fee [phone]) on 05/07/2020 11:53:37 PM      Labs Reviewed  BASIC METABOLIC PANEL - Abnormal; Notable for the following components:      Result Value   Calcium 8.7 (*)    All other components within normal limits  CBC  I-STAT BETA HCG BLOOD, ED ([facility], [facility], [facility] ONLY)  TROPONIN I (HIGH SENSITIVITY)  TROPONIN I (HIGH SENSITIVITY)    DG Chest 2 View  Final Result      Medications  alum & mag hydroxide-simeth (MAALOX/MYLANTA) 200-200-20 MG/5ML suspension 30 mL (has no administration in time range)     Procedures  /  Critical Care Procedures  ED Course and Medical Decision Making  I have reviewed the triage vital signs, the nursing notes, and pertinent available records from the EMR.  Listed above are laboratory and imaging tests that I personally ordered, reviewed, and interpreted and then considered in my medical decision making (see below for details).  Chest pain, low concern for ACS.  EKG is completely normal, troponin negative x2.  Patient is PERC negative, no hypoxia, no tachycardia, no evidence of DVT.  Favoring GI etiology.  Appropriate for discharge       [name], MD [place] [email or web address]  Final Clinical Impressions(s) / ED  Diagnoses     ICD-10-CM   1. Chest pain, unspecified type  R07.9     ED Discharge Orders         Ordered    sucralfate (CARAFATE) 1 g tablet  4 times daily PRN        05/08/20 0014           Discharge Instructions Discussed with and Provided to Patient:     Discharge Instructions     You were evaluated in the Emergency Department and after careful evaluation, we did not find any emergent condition requiring admission or further testing in the hospital.  Your exam/testing today was overall reassuring.  Symptoms seem to be due to acid reflux or indigestion.  You can use the Carafate medication as needed for any further discomfort.  Please return to the Emergency Department if you experience any worsening of your condition.  Thank you for allowing  [place] to be a part of your care.       Maudie Flakes, MD 05/08/20 [phone]

## 2020-05-08 NOTE — Discharge Instructions (Addendum)
You were evaluated in the Emergency Department and after careful evaluation, we did not find any emergent condition requiring admission or further testing in the hospital.  Your exam/testing today was overall reassuring.  Symptoms seem to be due to acid reflux or indigestion.  You can use the Carafate medication as needed for any further discomfort.  Please return to the Emergency Department if you experience any worsening of your condition.  Thank you for allowing [place] to be a part of your care.

## 2020-05-09 ENCOUNTER — Telehealth: Payer: Self-pay

## 2020-05-09 NOTE — Telephone Encounter (Signed)
Transition Care Management Unsuccessful Follow-up Telephone Call  Date of discharge and from where:  05/08/2020 from [place]  Attempts:  1st Attempt  Reason for unsuccessful TCM follow-up call:  Left voice message     

## 2020-05-12 ENCOUNTER — Ambulatory Visit: Payer: Medicaid Other | Admitting: Student

## 2020-05-12 ENCOUNTER — Encounter: Payer: Self-pay | Admitting: Student

## 2020-05-12 VITALS — BP 137/78 | HR 99 | Temp 98.5°F | Wt 245.3 lb

## 2020-05-12 DIAGNOSIS — D509 Iron deficiency anemia, unspecified: Secondary | ICD-10-CM

## 2020-05-12 DIAGNOSIS — E063 Autoimmune thyroiditis: Secondary | ICD-10-CM

## 2020-05-12 DIAGNOSIS — D649 Anemia, unspecified: Secondary | ICD-10-CM | POA: Diagnosis not present

## 2020-05-12 DIAGNOSIS — R030 Elevated blood-pressure reading, without diagnosis of hypertension: Secondary | ICD-10-CM

## 2020-05-12 DIAGNOSIS — E038 Other specified hypothyroidism: Secondary | ICD-10-CM

## 2020-05-12 DIAGNOSIS — E559 Vitamin D deficiency, unspecified: Secondary | ICD-10-CM | POA: Diagnosis not present

## 2020-05-12 DIAGNOSIS — R7989 Other specified abnormal findings of blood chemistry: Secondary | ICD-10-CM

## 2020-05-12 DIAGNOSIS — E538 Deficiency of other specified B group vitamins: Secondary | ICD-10-CM | POA: Diagnosis not present

## 2020-05-12 DIAGNOSIS — D510 Vitamin B12 deficiency anemia due to intrinsic factor deficiency: Secondary | ICD-10-CM

## 2020-05-12 DIAGNOSIS — Z0000 Encounter for general adult medical examination without abnormal findings: Secondary | ICD-10-CM | POA: Diagnosis not present

## 2020-05-12 MED ORDER — CYANOCOBALAMIN 1000 MCG/ML IJ SOLN
1000.0000 ug | Freq: Once | INTRAMUSCULAR | Status: AC
  Administered 2020-05-12: 1000 ug via INTRAMUSCULAR

## 2020-05-12 NOTE — Assessment & Plan Note (Signed)
Vitals:   05/12/20 1526  BP: 137/78   Patient's BP is well-controlled with lifestyle modification. No indication for antihypertensive at the moment.  --Continue to watch closely.

## 2020-05-12 NOTE — Telephone Encounter (Signed)
Transition Care Management Unsuccessful Follow-up Telephone Call  Date of discharge and from where:  05/08/2020 from [place]  Attempts:  2nd Attempt  Reason for unsuccessful TCM follow-up call:  Left voice message    

## 2020-05-12 NOTE — Assessment & Plan Note (Addendum)
Hx of iron deficiency anemia and occasional numbness and tingling of the hands. Patient has been without iron supplementation for almost a month. Recent hemoglobin on 4/20 was normal at 12.4. Iron and ferritin levels borderline low at 43 and 15 respectively.  Plan: --Refilled ferrous sulfate 325 mg daily

## 2020-05-12 NOTE — Patient Instructions (Signed)
Thank you, [name] for allowing [place] to provide your care today. Today we discussed vitamin D and B12 deficiency, your numbness and tingling and chest pain.    I have ordered the following labs for you:   Lab Orders     Hepatitis C antibody     Vitamin D (25 hydroxy)     Vitamin B12     TSH     Ferritin     Iron   I will call if any are abnormal. All of your labs can be accessed through "My Chart".  Please follow-up in 1 month  Should you have any questions or concerns please call the internal medicine clinic at [phone].    Linwood Dibbles, MD, MPH [facility]   My Chart Access: [email or web address]   If you have not already done so, please get your COVID 19 vaccine  To schedule an appointment for a COVID vaccine choice any of the following: Go to [email or web address]   Go to [email or web address]                  Call [phone]                                     Call [phone] and select Option 2

## 2020-05-12 NOTE — Progress Notes (Signed)
   CC: Follow up  HPI:  [name] is a 50 y.o. female with PMH as below who presents to the clinic for follow up after ED visit for chest pain. Please see problem based charting for evaluation, assessment and plan.  Past Medical History:  Diagnosis Date  . Anemia   . Fibroids, submucosal   . History of blood transfusion 02/2011   [facility] - ? 2-3 units transfused    Review of Systems:  Constitutional: Negative for fever or fatigue Eyes: Negative for visual changes Respiratory: Negative for shortness of breath Cardiac: Negative for chest pain or palpitations MSK: Negative for back pain Abdomen: Negative for abdominal pain, constipation or diarrhea Neuro: Positive for numbness, tingling and chronic headache Negative for weakness.   Physical Exam: General: Pleasant, obese middle-age woman. No acute distress. Cardiac: Tachycardic. Regular rhythm. No murmurs, rubs or gallops. No LE edema Respiratory: Lungs CTAB. No wheezing or crackles. Abdominal: Soft, symmetric and non tender. Normal BS. Skin: Warm, dry and intact without rashes or lesions Neuro: A&O x 3. Moves all extremities. Normal sensation.  Psych: Appropriate mood and affect.  Vitals:   05/12/20 1526  BP: 137/78  Pulse: 99  Temp: 98.5 F (36.9 C)  TempSrc: Oral  SpO2: 99%  Weight: 245 lb 4.8 oz (111.3 kg)    Assessment & Plan:   See Encounters Tab for problem based charting.  Patient discussed with Dr. Louann Liv, MD, MPH

## 2020-05-12 NOTE — Assessment & Plan Note (Signed)
Patient with a history of vitamin D deficiency s/p treatment with vitamin D 50,000 units weekly. Patient was transitioned to vitamin D 1000 units daily but reports she never received the prescription.   Plan: --Follow up 25-OH vitamin D levels and tailor regimen based on results.

## 2020-05-13 ENCOUNTER — Encounter: Payer: Self-pay | Admitting: Student

## 2020-05-13 LAB — FERRITIN: Ferritin: 15 ng/mL (ref 15–150)

## 2020-05-13 LAB — TSH: TSH: 3.17 u[IU]/mL (ref 0.450–4.500)

## 2020-05-13 LAB — VITAMIN B12: Vitamin B-12: >2000 pg/mL — ABNORMAL HIGH (ref 232–1245)

## 2020-05-13 LAB — VITAMIN D 25 HYDROXY (VIT D DEFICIENCY, FRACTURES): Vit D, 25-Hydroxy: 11.4 ng/mL — ABNORMAL LOW (ref 30.0–100.0)

## 2020-05-13 LAB — IRON: Iron: 43 ug/dL (ref 27–159)

## 2020-05-13 LAB — HEPATITIS C ANTIBODY: Hep C Virus Ab: <0.1 s/co ratio (ref 0.0–0.9)

## 2020-05-13 MED ORDER — LEVOTHYROXINE SODIUM 50 MCG PO TABS: 50.0000 ug | ORAL_TABLET | Freq: Every day | ORAL | 2 refills | Status: DC

## 2020-05-13 MED ORDER — FERROUS SULFATE 325 (65 FE) MG PO TABS: 325.0000 mg | ORAL_TABLET | Freq: Every day | ORAL | 1 refills | Status: DC

## 2020-05-13 MED ORDER — VITAMIN D3 1.25 MG (50000 UT) PO CAPS: 50000.0000 [IU] | ORAL_CAPSULE | ORAL | 0 refills | Status: DC

## 2020-05-13 NOTE — Assessment & Plan Note (Signed)
Hepatitis C screen was negative today.

## 2020-05-13 NOTE — Telephone Encounter (Signed)
Transition Care Management Unsuccessful Follow-up Telephone Call  Date of discharge and from where:  05/08/2020 from [facility]  Attempts:  3rd Attempt  Reason for unsuccessful TCM follow-up call:  Unable to reach patient

## 2020-05-13 NOTE — Assessment & Plan Note (Signed)
TSH levels decreased from 6.3 nine weeks ago to 3.170 today which is within the normal limits. Will continue to monitor closely --Refilled synthroid 50 mcg daily.  --Recheck TSH in 6 weeks

## 2020-05-13 NOTE — Assessment & Plan Note (Addendum)
>>  ASSESSMENT AND PLAN FOR PERNICIOUS ANEMIA WRITTEN ON 05/13/2020  1:30 PM BY Pranish Akhavan, Charisse March, MD  Patient with a history of vitamin B12 deficiency s/p multiple B12 injections in the past (6 total). Last B12 levels extremely low at 75 four months ago. Patient reports she has gone 2 months without any B12 injections. She reports numbness/tingling of hands and occasional bloating but no SOB, diarrhea, abd pain, chest pain, skin changes, gait problems, neuropsychiatric changes or weakness. She has not had any gastric resection or bypass. Patient is not a vegan, does not take a PPI, H2 blocker or metformin and denies any family history of autoimmune disorders. 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 Patient's B12 deficiency could be secondary to pernicious anemia, gastritis or celiac disease. Plan to continue addressing her B12 deficiency with B12 injections but further evaluation will be needed to identify the underlying cause of her B12 deficiency such as autoantibodies to intrinsic factor, tTG, or DGP. B12 levels now elevated (>2000) during clinic visit. Plan to discontinue the B12 injections for the time being and recheck in a few months.   Plan:  --Vitamin B12 1000 mcg injection today --Discontinue B12 injections --Consider work up for celiac, malabsorption syndrome or pernicious anemia in the future   >>ASSESSMENT AND PLAN FOR VITAMIN B12 DEFICIENCY WRITTEN ON 05/13/2020  1:26 PM BY Eldred Sooy M, MD  Patient with a history of vitamin B12 deficiency s/p multiple B12 injections in the past (6 total). Last B12 levels extremely low at 75 four months ago. Patient reports she has gone 2 months without any B12 injections. She reports numbness/tingling of hands and occasional bloating but no SOB, diarrhea, abd pain, chest pain, skin changes, gait problems, neuropsychiatric changes or weakness. She has not had any bowel resection or bypass. Patient is not  a vegan, does not take a PPI, H2 blocker or metformin and denies any family history of autoimmune disorders. 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 Patient's B12 deficiency could be secondary pernicious anemia, gastritis or celiac disease. Plan to continue addressing her B12 deficiency with B12 injections but further evaluation will be needed to identify the underlying cause of her B12 deficiency such as autoantibodies to intrinsic factor, tTG, or DGP.  B12 levels >2000 today so will discontinue B12 injections and monitor.   Plan:  --Vitamin B12 1000 mcg injection today --Discontinue monthly B12 injections.  --Check B12 levels in 4-6 months

## 2020-05-13 NOTE — Assessment & Plan Note (Addendum)
Patient with a history of vitamin B12 deficiency s/p multiple B12 injections in the past (6 total). Last B12 levels extremely low at 77 four months ago. Patient reports she has gone 2 months without any B12 injections. She reports numbness/tingling of hands and occasional bloating but no SOB, diarrhea, abd pain, chest pain, skin changes, gait problems, neuropsychiatric changes or weakness. She has not had any bowel resection or bypass. Patient is not a vegan, does not take a PPI, H2 blocker or metformin and denies any family history of autoimmune disorders. 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 Patient's B12 deficiency could be secondary pernicious anemia, gastritis or celiac disease. Plan to continue addressing her B12 deficiency with B12 injections but further evaluation will be needed to identify the underlying cause of her B12 deficiency such as autoantibodies to intrinsic factor, tTG, or DGP.  B12 levels >2000 today so will discontinue B12 injections and monitor.   Plan:  --Vitamin B12 1000 mcg injection today --Discontinue monthly B12 injections.  --Check B12 levels in 4-6 months

## 2020-05-20 NOTE — Progress Notes (Signed)
Internal Medicine Clinic Attending  Case discussed with Dr. Amponsah  At the time of the visit.  We reviewed the resident's history and exam and pertinent patient test results.  I agree with the assessment, diagnosis, and plan of care documented in the resident's note.  

## 2020-06-02 DIAGNOSIS — M2560 Stiffness of unspecified joint, not elsewhere classified: Secondary | ICD-10-CM | POA: Diagnosis not present

## 2020-06-02 DIAGNOSIS — Z9889 Other specified postprocedural states: Secondary | ICD-10-CM | POA: Diagnosis not present

## 2020-06-02 DIAGNOSIS — R519 Headache, unspecified: Secondary | ICD-10-CM | POA: Diagnosis not present

## 2020-06-02 DIAGNOSIS — M542 Cervicalgia: Secondary | ICD-10-CM | POA: Diagnosis not present

## 2020-06-02 DIAGNOSIS — G8928 Other chronic postprocedural pain: Secondary | ICD-10-CM | POA: Diagnosis not present

## 2020-06-02 DIAGNOSIS — M6281 Muscle weakness (generalized): Secondary | ICD-10-CM | POA: Diagnosis not present

## 2020-06-03 ENCOUNTER — Other Ambulatory Visit: Payer: Self-pay

## 2020-06-03 ENCOUNTER — Ambulatory Visit (HOSPITAL_COMMUNITY)
Admission: EM | Admit: 2020-06-03 | Discharge: 2020-06-03 | Disposition: A | Discharge status: Home / Self Care | Payer: Medicaid Other

## 2020-06-03 ENCOUNTER — Encounter (HOSPITAL_COMMUNITY): Payer: Self-pay

## 2020-06-03 DIAGNOSIS — J069 Acute upper respiratory infection, unspecified: Secondary | ICD-10-CM | POA: Diagnosis not present

## 2020-06-03 MED ORDER — FLUTICASONE PROPIONATE 50 MCG/ACT NA SUSP: 1.0000 | Freq: Every day | NASAL | 1 refills | Status: DC

## 2020-06-03 NOTE — ED Provider Notes (Signed)
[facility]    CSN: [id number] Arrival date & time: 06/03/20  1118     History   Chief Complaint Chief Complaint  Patient presents with  . Cough  . Nasal Congestion    HPI [name] is a 50 y.o. female who reports that for the past 8 days she has had sneezing, coughing, and runny nose.  She remembers feeling particularly bad on Mother's Day 05/25/2020 and was resting in the bed between Tuesday-Friday.  Her cough is since gotten worse, she reports she was not able to sleep at all this last Sunday.  However, overall she feels better.  Yesterday she bought some Mucinex cough and congestion and says this is helped her.  Sleeping sitting up is also been helpful because it makes her feel less stuffy (when she lies down her nose gets very stuffy and she cannot breathe).  No one else at home has similar symptoms.  She does not think that she has had a fever.  She typically takes Benadryl at night for allergies and to help her sleep.  She reports that she use Afrin nose spray for 2 days but woke up to a nosebleed and has not used it since.  HPI  Past Medical History:  Diagnosis Date  . Anemia   . Fibroids, submucosal   . History of blood transfusion 02/2011   [facility] - ? 2-3 units transfused    Patient Active Problem List   Diagnosis Date Noted  . Viral URI with cough 06/03/2020  . Vitamin B12 deficiency 05/12/2020  . Right low back pain 02/25/2020  . Viral conjunctivitis 02/14/2020  . Pernicious anemia 01/02/2020  . Vitamin D deficiency 12/31/2019  . Hashimoto's thyroiditis 12/31/2019  . Anxiety 12/19/2019  . Radiculopathy 04/26/2019  . Cervical myelopathy (HCC) 03/07/2019  . Migraine 02/19/2019  . Shoulder pain, left 12/07/2018  . Iron deficiency anemia 11/07/2018  . Elevated blood pressure reading 11/07/2018  . Preventative health care 11/07/2018  . Hematometra 01/09/2013  . Menorrhagia 01/09/2013  . Chronic pelvic pain in female 01/09/2013    Past Surgical  History:  Procedure Laterality Date  . ANTERIOR CERVICAL DECOMPRESSION/DISCECTOMY FUSION 4 LEVELS N/A 04/26/2019   Procedure: ANTERIOR CERVICAL DECOMPRESSION FUSION CERVICAL 4-5, CERVICAL 5-6, CERVICAL 6-7 WITH INSTRUMENTATION AND ALLOGRAFT;  Surgeon: Estill Bamberg, MD;  Location: [facility];  Service: Orthopedics;  Laterality: N/A;  . APPENDECTOMY    . BILATERAL SALPINGECTOMY Bilateral 04/16/2013   Procedure: BILATERAL SALPINGECTOMY;  Surgeon: Willodean Rosenthal, MD;  Location: [facility];  Service: Gynecology;  Laterality: Bilateral;  . CESAREAN SECTION  1994, 2002  . DILATION AND CURETTAGE OF UTERUS N/A 03/31/2012   Procedure: DILATATION AND CURETTAGE;  Surgeon: Allie Bossier, MD;  Location: [facility];  Service: Gynecology;  Laterality: N/A;  . LAPAROSCOPIC LYSIS OF ADHESIONS N/A 03/31/2012   Procedure: LAPAROSCOPIC LYSIS OF ADHESIONS;  Surgeon: Allie Bossier, MD;  Location: [facility];  Service: Gynecology;  Laterality: N/A;  . LAPAROSCOPY N/A 03/31/2012   Procedure: LAPAROSCOPY OPERATIVE;  Surgeon: Allie Bossier, MD;  Location: [facility];  Service: Gynecology;  Laterality: N/A;  . NOVASURE ABLATION N/A 03/31/2012   Procedure: NOVASURE ABLATION;  Surgeon: Allie Bossier, MD;  Location: [facility];  Service: Gynecology;  Laterality: N/A;  . ROBOTIC ASSISTED TOTAL HYSTERECTOMY N/A 04/16/2013   Procedure: ROBOTIC ASSISTED TOTAL HYSTERECTOMY;  Surgeon: Willodean Rosenthal, MD;  Location: [facility];  Service: Gynecology;  Laterality: N/A;  . TUBAL LIGATION  2002  . WISDOM  TOOTH EXTRACTION      OB History    Gravida  3   Para  2   Term  2   Preterm      AB  1   Living  2     SAB  1   IAB      Ectopic      Multiple      Live Births               Home Medications    Prior to Admission medications   Medication Sig Start Date End Date Taking? Authorizing Provider  Dextromethorphan-guaiFENesin [facility] DM PO) Take by mouth.   Yes [provider]  fluticasone (FLONASE) 50 MCG/ACT nasal spray  Place 1 spray into both nostrils daily. 1 spray in each nostril every day 06/03/20  Yes Milus Banister C, DO  pseudoephedrine-acetaminophen (TYLENOL SINUS) 30-500 MG TABS tablet Take 1 tablet by mouth every 4 (four) hours as needed.   Yes [provider]  acetaminophen (TYLENOL) 500 MG tablet Take 2 tablets (1,000 mg total) by mouth every 6 (six) hours as needed for moderate pain or headache. 02/25/20   Cato Mulligan, MD  Cholecalciferol (VITAMIN D3) 1.25 MG (50000 UT) CAPS Take 50,000 Units by mouth every 7 (seven) days. 05/13/20   Lacinda Axon, MD  ferrous sulfate 325 (65 FE) MG tablet Take 1 tablet (325 mg total) by mouth daily with breakfast. 05/13/20   Lacinda Axon, MD  levothyroxine (SYNTHROID) 50 MCG tablet Take 1 tablet (50 mcg total) by mouth daily. 05/13/20 08/11/20  Lacinda Axon, MD  meloxicam (MOBIC) 7.5 MG tablet Take 1 tablet (7.5 mg total) by mouth daily. 02/22/20   Zigmund Gottron, NP  tiZANidine (ZANAFLEX) 2 MG tablet Take 1-2 tablets (2-4 mg total) by mouth at bedtime. 02/22/20   Zigmund Gottron, NP  azelastine (ASTELIN) 0.1 % nasal spray Place 1 spray into both nostrils 2 (two) times daily. Use in each nostril as directed Patient not taking: Reported on 03/05/2019 12/01/17 12/24/19  Shella Maxim, NP  DULoxetine (CYMBALTA) 30 MG capsule Take 1 capsule (30 mg total) by mouth daily. 12/19/19 12/24/19  Seawell, Jaimie A, DO  gabapentin (NEURONTIN) 300 MG capsule TAKE 1 CAPSULE(300 MG) BY MOUTH TWICE DAILY Patient not taking: Reported on 08/14/2019 05/23/19 12/24/19  Maudie Mercury, MD    Family History Family History  Problem Relation Age of Onset  . Hypertension Mother   . Seizures Father   . Fibroids Sister     Social History Social History   Tobacco Use  . Smoking status: Former Smoker    Packs/day: 0.00    Types: Cigarettes    Quit date: 01/21/2011    Years since quitting: 9.3  . Smokeless tobacco: Never Used  Vaping Use  . Vaping Use: Never used   Substance Use Topics  . Alcohol use: Yes    Comment: social occasions  . Drug use: No     Allergies   Bee venom, Bactrim [sulfamethoxazole-trimethoprim], Codeine, and Sulfamethoxazole-trimethoprim   Review of Systems Review of Systems  Constitutional: Positive for activity change, chills and fatigue.  HENT: Positive for congestion, postnasal drip, rhinorrhea, sneezing and sore throat. Negative for ear pain and sinus pain.   Eyes:       Watery eyes  Respiratory: Positive for cough and chest tightness. Negative for shortness of breath.   Cardiovascular: Positive for chest pain (with coughing).  Gastrointestinal: Positive for diarrhea and nausea. Negative for  vomiting.       Mild nausea, no change in taste or smell  Genitourinary: Negative for dysuria and frequency.  Musculoskeletal: Negative.   Neurological: Positive for weakness and headaches.     Physical Exam Triage Vital Signs ED Triage Vitals  Enc Vitals Group     BP 06/03/20 1247 120/77     Pulse Rate 06/03/20 1247 77     Resp 06/03/20 1247 20     Temp 06/03/20 1247 98.2 F (36.8 C)     Temp Source 06/03/20 1247 Oral     SpO2 06/03/20 1247 99 %     Weight --      Height --      Head Circumference --      Peak Flow --      Pain Score 06/03/20 1244 0     Pain Loc --      Pain Edu? --      Excl. in [place]? --    No data found.  Updated Vital Signs BP 120/77 (BP Location: Right Arm)   Pulse 77   Temp 98.2 F (36.8 C) (Oral)   Resp 20   LMP 03/31/2013 (Exact Date)   SpO2 99%   Physical Exam Constitutional:      Appearance: Normal appearance. She is ill-appearing.  HENT:     Head: Normocephalic.     Right Ear: External ear normal.     Left Ear: Tympanic membrane and external ear normal.     Ears:     Comments: Serous effusion    Nose: No congestion or rhinorrhea.     Mouth/Throat:     Mouth: Mucous membranes are moist.     Pharynx: Posterior oropharyngeal erythema present. No oropharyngeal exudate.   Eyes:     Extraocular Movements: Extraocular movements intact.     Conjunctiva/sclera: Conjunctivae normal.  Cardiovascular:     Rate and Rhythm: Normal rate and regular rhythm.     Pulses: Normal pulses.     Heart sounds: Normal heart sounds.  Pulmonary:     Effort: Pulmonary effort is normal. No respiratory distress.     Breath sounds: Normal breath sounds.     Comments: Distant breath sounds 2/2 habitus Abdominal:     General: Bowel sounds are normal.     Palpations: Abdomen is soft.  Musculoskeletal:        General: No swelling or tenderness. Normal range of motion.     Cervical back: No rigidity or tenderness.  Lymphadenopathy:     Cervical: Cervical adenopathy present.  Skin:    General: Skin is warm and dry.     Capillary Refill: Capillary refill takes less than 2 seconds.     Findings: No rash.  Neurological:     General: No focal deficit present.     Mental Status: She is alert.    UC Treatments / Results  Labs (all labs ordered are listed, but only abnormal results are displayed) Labs Reviewed - No data to display  EKG  Radiology No results found.  Procedures Procedures (including critical care time)  Medications Ordered in UC Medications - No data to display  Initial Impression / Assessment and Plan / UC Course  I have reviewed the triage vital signs and the nursing notes.  Pertinent labs & imaging results that were available during my care of the patient were reviewed by me and considered in my medical decision making (see chart for details).    Cough: most likely in the  setting of viral upper respiratory infection.  Patient's lungs clear on physical exam, no concern for pneumonia at this time.  No sinus pressure or other signs concerning for bacterial infection requiring antibiotics.  Patient is overall feeling well/better than before. -Patient given information regarding conservative measures (honey, humidifier, Tylenol/ibuprofen, and  Flonase). -Patient given strict return precautions   Final Clinical Impressions(s) / UC Diagnoses   Final diagnoses:  Viral URI with cough     Discharge Instructions     1. Take 1 tbsp honey 3-4 times daily.  2. Use humidifier in your room at night to reduce cough/airway agitation.  3. Use Tylenol 500mg  with Ibuprofen/Advil 200mg  together 3 times daily to reduce body aches for the next 3 days or so.  4. You can continue to take Benadryl at night to help reduce allergy symptoms and help you sleep.  5. Use Flonase daily to reduce stuffy and runny nose.   If you develop worsening shortness of breath, fevers, aches, or are unable to stay hydrated, please come back and see [place].     ED Prescriptions    Medication Sig Dispense Auth. Provider   fluticasone (FLONASE) 50 MCG/ACT nasal spray Place 1 spray into both nostrils daily. 1 spray in each nostril every day 16 g Daisy Floro, DO     PDMP not reviewed this encounter.   Daisy Floro, DO 06/03/20 2111

## 2020-06-03 NOTE — ED Triage Notes (Signed)
Pt reports cough and nasal congestion x 1 week. Tylenol Sinus Severe and Mucinex gives some relief.

## 2020-06-03 NOTE — Discharge Instructions (Addendum)
Take 1 tbsp honey 3-4 times daily.  Use humidifier in your room at night to reduce cough/airway agitation.  Use Tylenol 500mg  with Ibuprofen/Advil 200mg  together 3 times daily to reduce body aches for the next 3 days or so.  You can continue to take Benadryl at night to help reduce allergy symptoms and help you sleep.  Use Flonase daily to reduce stuffy and runny nose.   If you develop worsening shortness of breath, fevers, aches, or are unable to stay hydrated, please come back and see [place].

## 2020-06-09 ENCOUNTER — Ambulatory Visit: Payer: Medicaid Other | Admitting: Internal Medicine

## 2020-06-09 VITALS — BP 120/74 | HR 78 | Wt 244.6 lb

## 2020-06-09 DIAGNOSIS — E559 Vitamin D deficiency, unspecified: Secondary | ICD-10-CM | POA: Diagnosis not present

## 2020-06-09 DIAGNOSIS — D509 Iron deficiency anemia, unspecified: Secondary | ICD-10-CM | POA: Diagnosis not present

## 2020-06-09 DIAGNOSIS — E063 Autoimmune thyroiditis: Secondary | ICD-10-CM

## 2020-06-09 NOTE — Patient Instructions (Signed)
[name],  It was a pleasure to see you today. Thank you for coming in.   Today we discussed your vitamin D levels. In regards to this please continue taking the 50,000 units weekly. I am repeating a vitamin level today, we may be able to switch you to the 1000 units if it's in an appropriate range.   We also discussed your recent infection. I am glad that you are feeling better.   Please return to clinic in 3 months or sooner if needed.   Thank you again for coming in.   [name].D.

## 2020-06-09 NOTE — Progress Notes (Signed)
   CC: Vitamin D deficiency, follow-up for viral URI  HPI:  [name] is a 50 y.o. with history listed below including Hashimoto's, vitamin D deficiency, iron deficiency, is presenting for follow-up of her vitamin D deficiency and recent urgent care visit for viral URI.  Patient recently seen in the ER 5/17 for a viral URI, she states that she was having a productive cough, feeling generally unwell, sneezing and felt like she could not breathe.  She was diagnosed with a viral URI and advised on supportive measures.  She reports that she is feeling a lot better today, has been using honey and over-the-counter Tylenol which she states has been helping.  Past Medical History:  Diagnosis Date  . Anemia   . Fibroids, submucosal   . History of blood transfusion 02/2011   [facility] - ? 2-3 units transfused   Review of Systems:   Constitutional: Negative for chills and fever.  Respiratory: Negative for shortness of breath.   Cardiovascular: Negative for chest pain and leg swelling.  Gastrointestinal: Negative for abdominal pain, nausea and vomiting.  Neurological: Negative for dizziness and headaches.   Physical Exam:  Vitals:   06/09/20 1038  BP: 120/74  Pulse: 78  SpO2: 100%  Weight: 244 lb 9.6 oz (110.9 kg)   Physical Exam HENT:     Head: Normocephalic and atraumatic.  Eyes:     Conjunctiva/sclera: Conjunctivae normal.     Pupils: Pupils are equal, round, and reactive to light.  Neck:     Thyroid: No thyromegaly.  Cardiovascular:     Rate and Rhythm: Normal rate and regular rhythm.     Heart sounds: Normal heart sounds. No murmur heard. No friction rub. No gallop.   Pulmonary:     Effort: Pulmonary effort is normal. No respiratory distress.     Breath sounds: Normal breath sounds. No wheezing.  Abdominal:     General: Bowel sounds are normal. There is no distension.     Palpations: Abdomen is soft.  Musculoskeletal:        General: Normal range of motion.      Cervical back: Normal range of motion and neck supple.  Skin:    General: Skin is warm and dry.     Findings: No erythema.  Neurological:     Mental Status: She is alert and oriented to person, place, and time.     Gait: Gait is intact.  Psychiatric:        Mood and Affect: Mood and affect normal.      Assessment & Plan:   See Encounters Tab for problem based charting.  Patient discussed with Dr. Philipp Ovens

## 2020-06-09 NOTE — Assessment & Plan Note (Signed)
Currently on Synthroid 50 mcg daily, last TSH 4 weeks ago was 3.17.  Denies any issues taking her medications.  -Continue Synthroid 50 mcg daily -Repeat TSH in 1 year

## 2020-06-09 NOTE — Progress Notes (Signed)
Internal Medicine Clinic Attending ° °Case discussed with Dr. Krienke  At the time of the visit.  We reviewed the resident’s history and exam and pertinent patient test results.  I agree with the assessment, diagnosis, and plan of care documented in the resident’s note.  °

## 2020-06-09 NOTE — Assessment & Plan Note (Signed)
Patient is currently on vitamin D 50,000 units weekly, this was started at her last office visit for a vitamin D level of 11.4.  She states that she is taking the medication 3 times, states that after taking her medication she starts feeling like her heart is fluttering, gets very anxious, and feels generally tired, this will last all day and on Saturday her symptoms resolve.  She states that she did not take her vitamin D last Friday.  She denies any fevers, chills, nausea, vomiting, headaches, lightheadedness, dizziness, or other symptoms. We discussed that typically vitamin D does not have a lot of side effects and that typically it does not cause the symptoms she is describing.  Discussed that her symptoms are unlikely related to her vitamin D and given her current level of vitamin D we would recommend continuing the higher dose for now.  Recommended to resume the vitamin D 50,000 units weekly.  Discussed that we will recheck a vitamin D level today and that if it is greater than 12 we can consider transitioning her to the 1000 units daily.  -Continue vitamin D 50,000 units weekly -Repeat vitamin D level today -Advised patient to contact us if she continues to have issues with the 50,000 unit dose, if vitamin D level is >12 can consider transitioning to 1000 units daily -Repeat vitamin D in 4-8 weeks

## 2020-06-09 NOTE — Assessment & Plan Note (Signed)
Currently on ferrous sulfate 325 mg daily, denies any issues taking these medications.  Her last iron and ferritin levels were 43 and 15.  -Consider repeating iron studies at next visit -Continue ferrous sulfate 325 mg daily

## 2020-06-10 LAB — VITAMIN D 25 HYDROXY (VIT D DEFICIENCY, FRACTURES): Vit D, 25-Hydroxy: 18.5 ng/mL — ABNORMAL LOW (ref 30.0–100.0)

## 2020-06-23 DIAGNOSIS — G8928 Other chronic postprocedural pain: Secondary | ICD-10-CM | POA: Diagnosis not present

## 2020-06-23 DIAGNOSIS — Z9889 Other specified postprocedural states: Secondary | ICD-10-CM | POA: Diagnosis not present

## 2020-06-23 DIAGNOSIS — R519 Headache, unspecified: Secondary | ICD-10-CM | POA: Diagnosis not present

## 2020-06-23 DIAGNOSIS — M542 Cervicalgia: Secondary | ICD-10-CM | POA: Diagnosis not present

## 2020-06-23 DIAGNOSIS — M6281 Muscle weakness (generalized): Secondary | ICD-10-CM | POA: Diagnosis not present

## 2020-06-23 DIAGNOSIS — M2560 Stiffness of unspecified joint, not elsewhere classified: Secondary | ICD-10-CM | POA: Diagnosis not present

## 2020-06-30 DIAGNOSIS — G43809 Other migraine, not intractable, without status migrainosus: Secondary | ICD-10-CM | POA: Diagnosis not present

## 2020-07-22 ENCOUNTER — Encounter: Payer: Self-pay | Admitting: *Deleted

## 2020-07-25 ENCOUNTER — Emergency Department (HOSPITAL_BASED_OUTPATIENT_CLINIC_OR_DEPARTMENT_OTHER)
Admission: EM | Admit: 2020-07-25 | Discharge: 2020-07-25 | Disposition: A | Discharge status: Home / Self Care | Payer: Medicaid Other | Attending: Emergency Medicine | Admitting: Emergency Medicine

## 2020-07-25 ENCOUNTER — Other Ambulatory Visit: Payer: Self-pay

## 2020-07-25 ENCOUNTER — Emergency Department (HOSPITAL_BASED_OUTPATIENT_CLINIC_OR_DEPARTMENT_OTHER): Payer: Medicaid Other

## 2020-07-25 ENCOUNTER — Encounter (HOSPITAL_BASED_OUTPATIENT_CLINIC_OR_DEPARTMENT_OTHER): Payer: Self-pay

## 2020-07-25 DIAGNOSIS — R0789 Other chest pain: Secondary | ICD-10-CM | POA: Diagnosis not present

## 2020-07-25 DIAGNOSIS — R079 Chest pain, unspecified: Secondary | ICD-10-CM | POA: Diagnosis not present

## 2020-07-25 DIAGNOSIS — R519 Headache, unspecified: Secondary | ICD-10-CM | POA: Diagnosis not present

## 2020-07-25 DIAGNOSIS — Z87891 Personal history of nicotine dependence: Secondary | ICD-10-CM | POA: Insufficient documentation

## 2020-07-25 LAB — BASIC METABOLIC PANEL
Anion gap: 9 (ref 5–15)
BUN: 14 mg/dL (ref 6–20)
CO2: 23 mmol/L (ref 22–32)
Calcium: 8.8 mg/dL — ABNORMAL LOW (ref 8.9–10.3)
Chloride: 106 mmol/L (ref 98–111)
Creatinine, Ser: 0.51 mg/dL (ref 0.44–1.00)
GFR, Estimated: >60 mL/min (ref >60)
Glucose, Bld: 107 mg/dL — ABNORMAL HIGH (ref 70–99)
Potassium: 3.6 mmol/L (ref 3.5–5.1)
Sodium: 138 mmol/L (ref 135–145)

## 2020-07-25 LAB — CBC
HCT: 36.2 % (ref 36.0–46.0)
Hemoglobin: 12.2 g/dL (ref 12.0–15.0)
MCH: 26.5 pg (ref 26.0–34.0)
MCHC: 33.7 g/dL (ref 30.0–36.0)
MCV: 78.7 fL — ABNORMAL LOW (ref 80.0–100.0)
Platelets: 326 10*3/uL (ref 150–400)
RBC: 4.6 MIL/uL (ref 3.87–5.11)
RDW: 14 % (ref 11.5–15.5)
WBC: 6.6 10*3/uL (ref 4.0–10.5)
nRBC: 0 % (ref 0.0–0.2)

## 2020-07-25 LAB — TROPONIN I (HIGH SENSITIVITY): Troponin I (High Sensitivity): 2 ng/L (ref <18)

## 2020-07-25 MED ORDER — ASPIRIN 81 MG PO CHEW: 324.0000 mg | CHEWABLE_TABLET | Freq: Once | ORAL | Status: DC

## 2020-07-25 NOTE — Discharge Instructions (Addendum)
Follow-up with your primary care doctor as needed

## 2020-07-25 NOTE — ED Provider Notes (Signed)
[facility] EMERGENCY DEPT Provider Note   CSN: [id number] Arrival date & time: 07/25/20  1923     History Chief Complaint  Patient presents with   Chest Pain    [name] is a 50 y.o. female.   Chest Pain Associated symptoms: no abdominal pain, no back pain, no shortness of breath and no weakness   Patient presents with anterior chest pain.  Dull.  Began around 9:00 this morning.  At times goes to left arm at times to right arm.  Constant.  Not worse exertion.  Not worse with food.  No shortness of breath.  States she had an episode of this in the past and was told it was anxiety.  No fevers or chills.  Is doing well the last few days.  Did have a headache few days ago.  No swelling her legs.  No history of hypertension.  Has not had previous cardiac work-up.    Past Medical History:  Diagnosis Date   Anemia    Fibroids, submucosal    History of blood transfusion 02/2011   [facility] - ? 2-3 units transfused    Patient Active Problem List   Diagnosis Date Noted   Vitamin B12 deficiency 05/12/2020   Pernicious anemia 01/02/2020   Vitamin D deficiency 12/31/2019   Hashimoto's thyroiditis 12/31/2019   Anxiety 12/19/2019   Radiculopathy 04/26/2019   Cervical myelopathy ([place]) 03/07/2019   Migraine 02/19/2019   Shoulder pain, left 12/07/2018   Iron deficiency anemia 11/07/2018   Preventative health care 11/07/2018   Hematometra 01/09/2013   Menorrhagia 01/09/2013   Chronic pelvic pain in female 01/09/2013    Past Surgical History:  Procedure Laterality Date   ANTERIOR CERVICAL DECOMPRESSION/DISCECTOMY FUSION 4 LEVELS N/A 04/26/2019   Procedure: ANTERIOR CERVICAL DECOMPRESSION FUSION CERVICAL 4-5, CERVICAL 5-6, CERVICAL 6-7 WITH INSTRUMENTATION AND ALLOGRAFT;  Surgeon: Phylliss Bob, MD;  Location: [facility];  Service: Orthopedics;  Laterality: N/A;   APPENDECTOMY     BILATERAL SALPINGECTOMY Bilateral 04/16/2013   Procedure: BILATERAL SALPINGECTOMY;  Surgeon:  Lavonia Drafts, MD;  Location: [facility];  Service: Gynecology;  Laterality: Bilateral;   CESAREAN SECTION  1994, 2002   DILATION AND CURETTAGE OF UTERUS N/A 03/31/2012   Procedure: DILATATION AND CURETTAGE;  Surgeon: Emily Filbert, MD;  Location: [facility];  Service: Gynecology;  Laterality: N/A;   LAPAROSCOPIC LYSIS OF ADHESIONS N/A 03/31/2012   Procedure: LAPAROSCOPIC LYSIS OF ADHESIONS;  Surgeon: Emily Filbert, MD;  Location: [facility];  Service: Gynecology;  Laterality: N/A;   LAPAROSCOPY N/A 03/31/2012   Procedure: LAPAROSCOPY OPERATIVE;  Surgeon: Emily Filbert, MD;  Location: [facility];  Service: Gynecology;  Laterality: N/A;   NOVASURE ABLATION N/A 03/31/2012   Procedure: NOVASURE ABLATION;  Surgeon: Emily Filbert, MD;  Location: [facility];  Service: Gynecology;  Laterality: N/A;   ROBOTIC ASSISTED TOTAL HYSTERECTOMY N/A 04/16/2013   Procedure: ROBOTIC ASSISTED TOTAL HYSTERECTOMY;  Surgeon: Lavonia Drafts, MD;  Location: [facility];  Service: Gynecology;  Laterality: N/A;   TUBAL LIGATION  2002   WISDOM TOOTH EXTRACTION       OB History     Gravida  3   Para  2   Term  2   Preterm      AB  1   Living  2      SAB  1   IAB      Ectopic      Multiple      Live Births  Family History  Problem Relation Age of Onset   Hypertension Mother    Seizures Father    Fibroids Sister     Social History   Tobacco Use   Smoking status: Former    Packs/day: 0.00    Pack years: 0.00    Types: Cigarettes    Quit date: 01/21/2011    Years since quitting: 9.5   Smokeless tobacco: Never  Vaping Use   Vaping Use: Never used  Substance Use Topics   Alcohol use: Yes    Comment: social occasions   Drug use: No    Home Medications Prior to Admission medications   Medication Sig Start Date End Date Taking? Authorizing Provider  acetaminophen (TYLENOL) 500 MG tablet Take 2 tablets (1,000 mg total) by mouth every 6 (six) hours as needed for moderate pain or headache.  02/25/20   Cato Mulligan, MD  Cholecalciferol (VITAMIN D3) 1.25 MG (50000 UT) CAPS Take 50,000 Units by mouth every 7 (seven) days. 05/13/20   Lacinda Axon, MD  Dextromethorphan-guaiFENesin [facility] DM PO) Take by mouth.    [provider]  ferrous sulfate 325 (65 FE) MG tablet Take 1 tablet (325 mg total) by mouth daily with breakfast. 05/13/20   Lacinda Axon, MD  fluticasone (FLONASE) 50 MCG/ACT nasal spray Place 1 spray into both nostrils daily. 1 spray in each nostril every day 06/03/20   Daisy Floro, DO  levothyroxine (SYNTHROID) 50 MCG tablet Take 1 tablet (50 mcg total) by mouth daily. 05/13/20 08/11/20  Lacinda Axon, MD  meloxicam (MOBIC) 7.5 MG tablet Take 1 tablet (7.5 mg total) by mouth daily. 02/22/20   Zigmund Gottron, NP  pseudoephedrine-acetaminophen (TYLENOL SINUS) 30-500 MG TABS tablet Take 1 tablet by mouth every 4 (four) hours as needed.    [provider]  tiZANidine (ZANAFLEX) 2 MG tablet Take 1-2 tablets (2-4 mg total) by mouth at bedtime. 02/22/20   Zigmund Gottron, NP  azelastine (ASTELIN) 0.1 % nasal spray Place 1 spray into both nostrils 2 (two) times daily. Use in each nostril as directed Patient not taking: Reported on 03/05/2019 12/01/17 12/24/19  Shella Maxim, NP  DULoxetine (CYMBALTA) 30 MG capsule Take 1 capsule (30 mg total) by mouth daily. 12/19/19 12/24/19  Seawell, Jaimie A, DO  gabapentin (NEURONTIN) 300 MG capsule TAKE 1 CAPSULE(300 MG) BY MOUTH TWICE DAILY Patient not taking: Reported on 08/14/2019 05/23/19 12/24/19  Maudie Mercury, MD    Allergies    Bee venom, Bactrim [sulfamethoxazole-trimethoprim], Codeine, and Sulfamethoxazole-trimethoprim  Review of Systems   Review of Systems  Constitutional:  Negative for appetite change.  HENT:  Negative for congestion.   Respiratory:  Negative for shortness of breath.   Cardiovascular:  Positive for chest pain.  Gastrointestinal:  Negative for abdominal pain.  Genitourinary:   Negative for flank pain.  Musculoskeletal:  Negative for back pain.  Skin:  Negative for rash.  Neurological:  Negative for weakness.   Physical Exam Updated Vital Signs BP (!) 141/78 (BP Location: Right Arm) Comment: Simultaneous filing. User may not have seen previous data.  Pulse 64 Comment: Simultaneous filing. User may not have seen previous data.  Temp 98.5 F (36.9 C) (Oral)   Resp 18 Comment: Simultaneous filing. User may not have seen previous data.  Ht 5\' 5"  (1.651 m)   Wt 109.3 kg   LMP 03/31/2013 (Exact Date)   SpO2 100% Comment: Simultaneous filing. User may not have seen previous data.  BMI 40.10 kg/m  Physical Exam Vitals and nursing note reviewed.  Constitutional:      Appearance: She is well-developed.  HENT:     Head: Atraumatic.  Cardiovascular:     Rate and Rhythm: Normal rate and regular rhythm.  Pulmonary:     Breath sounds: No wheezing, rhonchi or rales.  Chest:     Chest wall: No tenderness.  Abdominal:     Tenderness: There is no abdominal tenderness.  Musculoskeletal:     Cervical back: Neck supple.     Right lower leg: No edema.     Left lower leg: No edema.  Skin:    General: Skin is warm.     Capillary Refill: Capillary refill takes less than 2 seconds.  Neurological:     Mental Status: She is alert.    ED Results / Procedures / Treatments   Labs (all labs ordered are listed, but only abnormal results are displayed) Labs Reviewed  BASIC METABOLIC PANEL - Abnormal; Notable for the following components:      Result Value   Glucose, Bld 107 (*)    Calcium 8.8 (*)    All other components within normal limits  CBC - Abnormal; Notable for the following components:   MCV 78.7 (*)    All other components within normal limits  TROPONIN I (HIGH SENSITIVITY)    EKG EKG Interpretation  Date/Time:  Friday July 25 2020 19:30:44 EDT Ventricular Rate:  69 PR Interval:  146 QRS Duration: 90 QT Interval:  424 QTC Calculation: 455 R  Axis:   48 Text Interpretation: Sinus rhythm Abnormal R-wave progression, early transition Confirmed by Davonna Belling [phone]) on 07/25/2020 7:50:04 PM  Radiology DG Chest Portable 1 View  Result Date: 07/25/2020 CLINICAL DATA:  Chest pain since 11/30 now radiating to left arm EXAM: PORTABLE CHEST 1 VIEW COMPARISON:  CT 02/21/2011, radiograph 05/07/2020 FINDINGS: Accounting for body habitus, the lungs are clear. No consolidation, features of edema, pneumothorax, or effusion. Pulmonary vascularity is normally distributed. The cardiomediastinal contours are unremarkable. No acute osseous or soft tissue abnormality. Prior cervical fusion hardware, incompletely assessed. Telemetry leads overlie the chest. IMPRESSION: No acute cardiopulmonary abnormality. Electronically Signed   By: Lovena Le M.D.   On: 07/25/2020 20:17    Procedures Procedures   Medications Ordered in ED Medications - No data to display  ED Course  I have reviewed the triage vital signs and the nursing notes.  Pertinent labs & imaging results that were available during my care of the patient were reviewed by me and considered in my medical decision making (see chart for details).    MDM Rules/Calculators/A&P                          Patient with chest pain.  Dull.  Goes to both arms.  EKG reassuring.  States she has had similar pain with anxiety in the past.  EKG reassuring.  Chest x-ray reassuring.  Doubt pulmonary embolism.  Negative troponin.  Heart pathway score 3.  Appears stable for discharge home with outpatient follow-up.  Will discharge home Final Clinical Impression(s) / ED Diagnoses Final diagnoses:  Chest pain, unspecified type    Rx / DC Orders ED Discharge Orders     None        Davonna Belling, MD 07/25/20 2108

## 2020-07-25 NOTE — ED Notes (Signed)
Chest pain onset this afternoon.  Constant mid pain radiates to left arm then to right arm.  Denies SOB.  No nausea.

## 2020-07-25 NOTE — ED Triage Notes (Signed)
Pt is present for centralized chest pain since 1130 this morning that has now radiated to her left arm. Pt describes the pain as a pressure and "very uncomfortable". Denies SOB/N/V/D. Pt has only had this pain one other time and was told it was anxiety.

## 2020-07-28 ENCOUNTER — Telehealth: Payer: Self-pay

## 2020-07-28 NOTE — Telephone Encounter (Signed)
Transition Care Management Unsuccessful Follow-up Telephone Call  Date of discharge and from where:  07/25/2020- [facility]  Attempts:  1st Attempt  Reason for unsuccessful TCM follow-up call:  Left voice message

## 2020-07-29 NOTE — Telephone Encounter (Signed)
Transition Care Management Unsuccessful Follow-up Telephone Call  Date of discharge and from where:  07/25/2020- [facility]  Attempts:  2nd Attempt  Reason for unsuccessful TCM follow-up call:  Left voice message

## 2020-07-30 NOTE — Telephone Encounter (Signed)
Transition Care Management Unsuccessful Follow-up Telephone Call  Date of discharge and from where:  07/25/2020-[facility]   Attempts:  3rd Attempt  Reason for unsuccessful TCM follow-up call:  Left voice message

## 2020-08-14 ENCOUNTER — Other Ambulatory Visit: Payer: Self-pay | Admitting: *Deleted

## 2020-08-14 DIAGNOSIS — E063 Autoimmune thyroiditis: Secondary | ICD-10-CM

## 2020-08-14 DIAGNOSIS — E038 Other specified hypothyroidism: Secondary | ICD-10-CM

## 2020-08-15 MED ORDER — LEVOTHYROXINE SODIUM 50 MCG PO TABS: 50.0000 ug | ORAL_TABLET | Freq: Every day | ORAL | 2 refills | Status: DC

## 2020-10-23 ENCOUNTER — Ambulatory Visit: Payer: Medicaid Other | Admitting: Internal Medicine

## 2020-10-23 ENCOUNTER — Other Ambulatory Visit: Payer: Self-pay

## 2020-10-23 ENCOUNTER — Encounter: Payer: Self-pay | Admitting: Internal Medicine

## 2020-10-23 VITALS — BP 153/83 | HR 63 | Temp 98.2°F | Ht 65.0 in | Wt 249.1 lb

## 2020-10-23 DIAGNOSIS — E063 Autoimmune thyroiditis: Secondary | ICD-10-CM | POA: Diagnosis not present

## 2020-10-23 DIAGNOSIS — D509 Iron deficiency anemia, unspecified: Secondary | ICD-10-CM

## 2020-10-23 DIAGNOSIS — R030 Elevated blood-pressure reading, without diagnosis of hypertension: Secondary | ICD-10-CM

## 2020-10-23 DIAGNOSIS — Z0000 Encounter for general adult medical examination without abnormal findings: Secondary | ICD-10-CM

## 2020-10-23 DIAGNOSIS — E538 Deficiency of other specified B group vitamins: Secondary | ICD-10-CM | POA: Diagnosis not present

## 2020-10-23 DIAGNOSIS — E559 Vitamin D deficiency, unspecified: Secondary | ICD-10-CM | POA: Diagnosis not present

## 2020-10-23 MED ORDER — VITAMIN D3 1.25 MG (50000 UT) PO CAPS: 50000.0000 [IU] | ORAL_CAPSULE | ORAL | 0 refills | Status: DC

## 2020-10-23 NOTE — Assessment & Plan Note (Addendum)
Today in clinic patient's blood pressure was 145/92, and her blood pressure remained elevated in the 734Y to 370D systolic upon repeat measurements in both left and right arm.  Patient reports prior to today, she had blood pressures in the 120s to 130s at previous visits. Plan: -Patient was provided with blood pressure log to record blood pressures, and was instructed to take her blood pressure either at home or at a local pharmacy a few times a week and record the blood pressures so that we may review them at her follow-up appointment.

## 2020-10-23 NOTE — Assessment & Plan Note (Addendum)
Patient has history of vitamin D deficiency.  Last vitamin D level low at 18 from 06/09/2020.  Patient received prescription for vitamin D supplementation for 6 weeks.  She ran out of her prescription in May.  And has not been on vitamin D supplementation since. Plan: -Recheck Vitamin D today -Reordered vitamin D supplementation for 6 weeks until patient is all able to follow-up in clinic  Addendum: Patient called and updated with results that vitamin D was low.  Patient has already picked up vitamin D supplementation prescription.

## 2020-10-23 NOTE — Assessment & Plan Note (Signed)
>>  ASSESSMENT AND PLAN FOR VITAMIN B12 DEFICIENCY WRITTEN ON 10/24/2020 11:44 AM BY ZINOVIEV, EVA, MD  Patient has a history of B12 deficiency for which patient has received B12 supplementation in the past.  Most recent B12 level from 05/12/2020 was elevated at greater than 2000.  Patient noted in September she started having tingling and numbness in her hands especially her right hand, which is similar to symptoms she had prior with B12 deficiency.  Differential diagnosis includes B12 deficiency versus other causes such as carpal tunnel.  Plan: -Recheck B12 today  Addendum: Patient was called and updated with lab results showing low vitamin B12 187.  Ordered B12 weekly injections X4 doses to start Monday 10/10.

## 2020-10-23 NOTE — Assessment & Plan Note (Addendum)
At age 50, patient is due for colonoscopy.  Patient also endorses weight fluctuations, most recently weight 249lbs with BMI 41.  Patient is declining flu shot today since she states it makes her feel unwell and her birthday is coming up.  She states she will get her flu shot at another time. Plan: -Ambulatory referral to gastroenterology for colonoscopy -Ambulatory referral to diabetes educator for nutrition and weight management

## 2020-10-23 NOTE — Patient Instructions (Signed)
Thank you, Ms.Tonya Wantz Termini for allowing [place] to provide your care today. Today we discussed:  Vitamin D deficiency: I will refill your vitamin D prescription, we will recheck vitamin D levels today  B12 deficiency: Since you are having more numbness/tingling we will recheck B12 today  Iron deficiency anemia: Given your recent fatigue, we will recheck a CBC (blood counts) as well as iron studies  Elevated blood pressure reading: Your blood pressure today in clinic was 145/92 and on recheck stayed elevated.  We will have you check blood pressures at home or at your local pharmacy a few times a week and record those blood pressures and your log.  We will review these blood pressures when you come back to clinic.  Health maintenance: We have placed a referral to GI for colonoscopy, we have placed a referral to our dietitian here at clinic to discuss strategies for weight management.  I have ordered the following labs for you:   Lab Orders         Vitamin D (25 hydroxy)         Vitamin B12         CBC no Diff         Ferritin         Iron      I will call if any are abnormal. All of your labs can be accessed through "My Chart".  My Chart Access: [email or web address]  Please follow-up in 1 month.  If any of your lab work comes back abnormal and we need to make changes, we can call you or bring you in sooner.  Please make sure to arrive 15 minutes prior to your next appointment. If you arrive late, you may be asked to reschedule.    We look forward to seeing you next time. Please call our clinic at [phone] if you have any questions or concerns. The best time to call is Monday-Friday from 9am-4pm, but there is someone available 24/7. If after hours or the weekend, call the main hospital number and ask for the Internal Medicine Resident On-Call. If you need medication refills, please notify your pharmacy one week in advance and they will send [place] a  request.   Thank you for letting us take part in your care. Wishing you the best!  Wayland Denis, MD 10/23/2020, 9:43 AM IM Resident, PGY-1

## 2020-10-23 NOTE — Assessment & Plan Note (Signed)
Patient presents for 1 week history of fatigue which has improved. Patient feels back to baseline in clinic today.  More likely patient had an acute viral illness causing a few days of fatigue, though denies fevers, congestion, shortness of breath, cough, diarrhea.  Patient also denying lightheadedness or dizziness, hematochezia, hematuria, vaginal bleeding.  She has a history of iron deficiency anemia, last hemoglobin in July 2022 was 12.2 with microcytosis.  Last iron studies completed 04/2020 which showed both iron and ferritin on the low end of normal.  She endorses compliance with iron supplementation once every other day. Plan: -Recheck iron studies, CBC -Continue ferrous sulfate 325 mg tablet every other day -Reassuring that her fatigue has improved and only lasted for short duration, though reasonable to recheck lab work at this time

## 2020-10-23 NOTE — Assessment & Plan Note (Addendum)
Patient has a history of B12 deficiency for which patient has received B12 supplementation in the past.  Most recent B12 level from 05/12/2020 was elevated at greater than 2000.  Patient noted in September she started having tingling and numbness in her hands especially her right hand, which is similar to symptoms she had prior with B12 deficiency.  Differential diagnosis includes B12 deficiency versus other causes such as carpal tunnel.  Plan: -Recheck B12 today  Addendum: Patient was called and updated with lab results showing low vitamin B12 187.  Ordered B12 weekly injections X4 doses to start Monday 10/10.

## 2020-10-23 NOTE — Progress Notes (Signed)
  CC: Routine checkup, recent fatigue  HPI:  [name] is a 50 y.o. female with a past medical history stated below and presents today for routine health checkup, 1 week history of fatigue. Please see problem based assessment and plan for additional details.  Past Medical History:  Diagnosis Date   B12 deficiency    Elevated blood pressure reading    Fibroids, submucosal    History of blood transfusion 02/19/2011   [facility] - ? 2-3 units transfused   Hypothyroidism    Iron deficiency anemia    Vitamin D deficiency     Current Outpatient Medications on File Prior to Visit  Medication Sig Dispense Refill   acetaminophen (TYLENOL) 500 MG tablet Take 2 tablets (1,000 mg total) by mouth every 6 (six) hours as needed for moderate pain or headache. 30 tablet 0   Cholecalciferol (VITAMIN D3) 1.25 MG (50000 UT) CAPS Take 50,000 Units by mouth every 7 (seven) days. 6 capsule 0   Dextromethorphan-guaiFENesin (MUCINEX DM PO) Take by mouth.     ferrous sulfate 325 (65 FE) MG tablet Take 1 tablet (325 mg total) by mouth daily with breakfast. 90 tablet 1   fluticasone (FLONASE) 50 MCG/ACT nasal spray Place 1 spray into both nostrils daily. 1 spray in each nostril every day 16 g 1   levothyroxine (SYNTHROID) 50 MCG tablet Take 1 tablet (50 mcg total) by mouth daily. 90 tablet 2   meloxicam (MOBIC) 7.5 MG tablet Take 1 tablet (7.5 mg total) by mouth daily. 30 tablet 0   pseudoephedrine-acetaminophen (TYLENOL SINUS) 30-500 MG TABS tablet Take 1 tablet by mouth every 4 (four) hours as needed.     tiZANidine (ZANAFLEX) 2 MG tablet Take 1-2 tablets (2-4 mg total) by mouth at bedtime. 20 tablet 0   [DISCONTINUED] azelastine (ASTELIN) 0.1 % nasal spray Place 1 spray into both nostrils 2 (two) times daily. Use in each nostril as directed (Patient not taking: Reported on 03/05/2019) 30 mL 0   [DISCONTINUED] DULoxetine (CYMBALTA) 30 MG capsule Take 1 capsule (30 mg total) by mouth daily. 30 capsule 2    [DISCONTINUED] gabapentin (NEURONTIN) 300 MG capsule TAKE 1 CAPSULE(300 MG) BY MOUTH TWICE DAILY (Patient not taking: Reported on 08/14/2019) 60 capsule 5   No current facility-administered medications on file prior to visit.    Social History: Patient works as Probation officer  Review of Systems: ROS negative except for what is noted on the assessment and plan.  Vitals:   10/23/20 0851  BP: (!) 145/92  Pulse: 72  Temp: 98.2 F (36.8 C)  TempSrc: Oral  SpO2: 98%  Weight: 249 lb 1.6 oz (113 kg)  Height: 5\' 5"  (1.651 m)     Physical Exam: General: Well appearing African-American female, NAD HENT: normocephalic, atraumatic, MMM EYES: conjunctiva non-erythematous, no scleral icterus CV: regular rate, normal rhythm, no murmurs, rubs, gallops.  No lower extremity edema Pulmonary: Normal work of breathing on RA, lungs clear to auscultation, no rales, wheezes, rhonchi Abdominal: non-distended, soft, non-tender to palpation, normal BS Skin: Warm and dry, no rashes or lesions Neurological: MS: awake, alert and oriented x3, normal speech and fund of knowledge Motor: moves all extremities antigravity Psych: normal affect    Assessment & Plan:   See Encounters Tab for problem based charting.  Patient seen with Dr. Jilda Panda, M.D. [facility], PGY-1 Pager: [phone] Date 10/23/2020 Time 9:55 AM

## 2020-10-23 NOTE — Assessment & Plan Note (Signed)
Patient currently takes Synthroid 50 MCG daily and endorses adherence.  She takes her medication first thing the morning half hour prior to breakfast and taking her other medications.  She has a 1 week history of fatigue, though feels back to her baseline today in clinic.  She has intermittent constipation that she manages conservatively.  TSH was last tested in April 2022 and was normal at 3.17. Plan: -Continue Synthroid 50 MCG daily -Considering repeat TSH if patient continues to have persistent fatigue

## 2020-10-24 LAB — CBC
Hematocrit: 39.4 % (ref 34.0–46.6)
Hemoglobin: 12.8 g/dL (ref 11.1–15.9)
MCH: 26.1 pg — ABNORMAL LOW (ref 26.6–33.0)
MCHC: 32.5 g/dL (ref 31.5–35.7)
MCV: 80 fL (ref 79–97)
Platelets: 317 10*3/uL (ref 150–450)
RBC: 4.9 x10E6/uL (ref 3.77–5.28)
RDW: 13.1 % (ref 11.7–15.4)
WBC: 5.3 10*3/uL (ref 3.4–10.8)

## 2020-10-24 LAB — IRON: Iron: 68 ug/dL (ref 27–159)

## 2020-10-24 LAB — VITAMIN D 25 HYDROXY (VIT D DEFICIENCY, FRACTURES): Vit D, 25-Hydroxy: 14.6 ng/mL — ABNORMAL LOW (ref 30.0–100.0)

## 2020-10-24 LAB — FERRITIN: Ferritin: 27 ng/mL (ref 15–150)

## 2020-10-24 LAB — VITAMIN B12: Vitamin B-12: 187 pg/mL — ABNORMAL LOW (ref 232–1245)

## 2020-10-24 MED ORDER — CYANOCOBALAMIN 1000 MCG/ML IJ SOLN
1000.0000 ug | INTRAMUSCULAR | Status: AC
  Administered 2020-10-28: 1000 ug via INTRAMUSCULAR

## 2020-10-24 NOTE — Addendum Note (Signed)
Addended by: Wayland Denis on: 10/24/2020 11:46 AM   Modules accepted: Orders

## 2020-10-27 NOTE — Progress Notes (Signed)
Internal Medicine Clinic Attending ° °I saw and evaluated the patient.  I personally confirmed the key portions of the history and exam documented by Dr. Zinoviev and I reviewed pertinent patient test results.  The assessment, diagnosis, and plan were formulated together and I agree with the documentation in the resident’s note.  °

## 2020-10-28 ENCOUNTER — Ambulatory Visit (INDEPENDENT_AMBULATORY_CARE_PROVIDER_SITE_OTHER): Payer: Medicaid Other | Admitting: *Deleted

## 2020-10-28 ENCOUNTER — Encounter: Payer: Self-pay | Admitting: *Deleted

## 2020-10-28 DIAGNOSIS — E538 Deficiency of other specified B group vitamins: Secondary | ICD-10-CM

## 2020-10-28 NOTE — Progress Notes (Signed)
Pt asked for letter d/t being unable to go to work last week, Oct 5, c/o being fatigue, no energy, arm numbness d/t low Vit B12 level, She was seen on Oct 6,

## 2020-11-06 ENCOUNTER — Telehealth: Payer: Self-pay | Admitting: Dietician

## 2020-11-06 NOTE — Telephone Encounter (Signed)
Called to follow up on referral for help with weight loss. [name] states she is oN the waiting list for healthy weight & wellness and is looking forward to going.

## 2020-11-12 ENCOUNTER — Telehealth: Payer: Self-pay | Admitting: Internal Medicine

## 2020-11-12 NOTE — Telephone Encounter (Signed)
..   Medicaid Managed Care   Unsuccessful Outreach Note  11/12/2020 Name: [name] MRN: [id number] DOB: 1970-06-06  Referred by: Maudie Mercury, MD Reason for referral : High Risk Managed Medicaid (I called the patient today to get her scheduled for a phone visit with the [place]. I left my name and number on her VM.)   An unsuccessful telephone outreach was attempted today. The patient was referred to the case management team for assistance with care management and care coordination.   Follow Up Plan: The care management team will reach out to the patient again over the next 7-14 days.   [place]

## 2020-11-25 ENCOUNTER — Telehealth: Payer: Self-pay | Admitting: Internal Medicine

## 2020-11-25 NOTE — Telephone Encounter (Signed)
..   Medicaid Managed Care   Unsuccessful Outreach Note  11/25/2020 Name: [name] MRN: [id number] DOB: 1970-04-18  Referred by: Maudie Mercury, MD Reason for referral : High Risk Managed Medicaid (I called the patient today to offer her the services of the [facility] Team. I left my name and number on her VM.)   A second unsuccessful telephone outreach was attempted today. The patient was referred to the case management team for assistance with care management and care coordination.   Follow Up Plan: The care management team will reach out to the patient again over the next 14 days.   [name]

## 2020-12-02 ENCOUNTER — Telehealth: Payer: Self-pay | Admitting: Internal Medicine

## 2020-12-02 ENCOUNTER — Ambulatory Visit (INDEPENDENT_AMBULATORY_CARE_PROVIDER_SITE_OTHER): Payer: Medicaid Other | Admitting: Internal Medicine

## 2020-12-02 ENCOUNTER — Ambulatory Visit: Payer: Medicaid Other

## 2020-12-02 DIAGNOSIS — J069 Acute upper respiratory infection, unspecified: Secondary | ICD-10-CM

## 2020-12-02 NOTE — Telephone Encounter (Signed)
Pt Requesting a call back.  Pt states she has coughing, congested, fever and weak.  Pt has been sch this afternoon.  Date: 12/02/2020 Status: Sch  Time: 3:45 PM Length: 30  Visit Type: TELEPHONE OFFICE VISIT [1518] Copay: $0.00  Provider: Jose Persia, MD

## 2020-12-02 NOTE — Progress Notes (Signed)
  [facility] Internal Medicine Residency Telephone Encounter Continuity Care Appointment  HPI:  This telephone encounter was created for [name] on 12/02/2020 for the following purpose/cc cough.  [name] states she began experiencing a dry cough approximately 3 days ago in the morning but felt fine otherwise.  She subsequently began developing worsening in her cough that remained dry, body aches, fever, malaise, sneezing, generalized weakness, nausea, diarrhea (nonbloody, nonmelanotic) and headache.  She denies any shortness of breath or rhinorrhea.  She has been taking DayQuil and NyQuil with relief in her symptoms.  She has been taking Tylenol for her fevers.  She is able to tolerate p.o. intake without any difficulty.   Past Medical History:  Past Medical History:  Diagnosis Date   B12 deficiency    Elevated blood pressure reading    Fibroids, submucosal    History of blood transfusion 02/19/2011   [facility] - ? 2-3 units transfused   Hypothyroidism    Iron deficiency anemia    Vitamin D deficiency      ROS:  See above for details   Assessment / Plan / Recommendations:  Please see A&P under problem oriented charting for assessment of the patient's acute and chronic medical conditions.  As always, pt is advised that if symptoms worsen or new symptoms arise, they should go to an urgent care facility or to to ER for further evaluation.   Consent and Medical Decision Making:  Patient discussed with Dr.  Saverio Danker This is a telephone encounter between [place] and [name] on 12/02/2020 for cough. The visit was conducted with the patient located at home and [name] at [facility]. The patient's identity was confirmed using their DOB and current address. The patient has consented to being evaluated through a telephone encounter and understands the associated risks (an examination cannot be done and the patient may need to come in for an appointment) / benefits (allows the  patient to remain at home, decreasing exposure to coronavirus). I personally spent 12 minutes on medical discussion.

## 2020-12-02 NOTE — Assessment & Plan Note (Addendum)
[name] Him history is consistent with a viral URI. Home COVID-19 test negative. Recommended continued conservative management with DayQuil, NyQuil and Tylenol.  No further interventions required at this time.  - Follow-up in the clinic if no improvement in symptoms - Discussed return precautions to an ED or urgent care should she develop any shortness of breath or chest pain - Work note provided via mail

## 2020-12-02 NOTE — Telephone Encounter (Signed)
RTC, Patient states her son was sick over the weekend.   Patient started with a cough on Sunday.  Now c/o cough, body aches, low grade temp (100.3) and fatigue.  Denies SOB, denies CP.  Pt has a home covid test and RN instructed patient to take the test before her telehealth appt with physician this afternoon which she agreed.  She was informed if she develops and SOB or CP to present to ED. SChaplin, RN,BSN

## 2020-12-05 NOTE — Progress Notes (Signed)
Internal Medicine Clinic Attending  Case discussed with Dr. Basaraba  At the time of the visit.  We reviewed the resident's history and exam and pertinent patient test results.  I agree with the assessment, diagnosis, and plan of care documented in the resident's note.  

## 2020-12-08 ENCOUNTER — Telehealth: Payer: Self-pay

## 2020-12-08 NOTE — Telephone Encounter (Signed)
RTC, Patient states she was still feeling bad at the end of last week and she is actually going back to work on 11/22.  The letter in her chart is for "Return to work on 11/17"  Wants to know if Dr. B can change date to 11/22?  She will call back tomorrow. SChaplin, RN,BSN

## 2020-12-08 NOTE — Telephone Encounter (Signed)
Requesting to speak with a nurse about getting a letter to returned to work 12/09/2020. Please call pt back.

## 2020-12-09 NOTE — Telephone Encounter (Signed)
Call from pt stating she was informed to call back with the fax number for her job and we will be able to fax the return to work letter. Pt states she went back to work today. Letter has "Return to work on 11/17" in which I told the pt. She states she only works 1 day a week, on Tuesdays and she went back to today - needs date on letter to be changed to today. Fax # [phone] Thanks

## 2020-12-09 NOTE — Telephone Encounter (Signed)
Letter in chart (Return to Work 11/17) faxed to number given with confirmation received.

## 2020-12-30 ENCOUNTER — Ambulatory Visit (INDEPENDENT_AMBULATORY_CARE_PROVIDER_SITE_OTHER): Payer: Medicaid Other | Admitting: *Deleted

## 2020-12-30 DIAGNOSIS — E538 Deficiency of other specified B group vitamins: Secondary | ICD-10-CM | POA: Diagnosis present

## 2020-12-30 MED ORDER — CYANOCOBALAMIN 1000 MCG/ML IJ SOLN
1000.0000 ug | Freq: Once | INTRAMUSCULAR | Status: AC
  Administered 2020-12-30: 1000 ug via INTRAMUSCULAR

## 2020-12-31 ENCOUNTER — Telehealth: Payer: Self-pay | Admitting: Internal Medicine

## 2020-12-31 NOTE — Telephone Encounter (Signed)
.. °  Medicaid Managed Care   Unsuccessful Outreach Note  12/31/2020 Name: [name] MRN: [id number] DOB: 1970-04-04  Referred by: Maudie Mercury, MD Reason for referral : High Risk Managed Medicaid (Third attempt to reach patient to get scheduled with the MM Team. I left my name and number on her VM.)   Third unsuccessful telephone outreach was attempted today. The patient was referred to the case management team for assistance with care management and care coordination. The patient's primary care provider has been notified of our unsuccessful attempts to make or maintain contact with the patient. The care management team is pleased to engage with this patient at any time in the future should he/she be interested in assistance from the care management team.   Follow Up Plan: We have been unable to make contact with the patient for follow up. The care management team is available to follow up with the patient after provider conversation with the patient regarding recommendation for care management engagement and subsequent re-referral to the care management team.  The [place] team will follow up with the patient and will provide direct communication to the PCP for this patient.   [place]

## 2021-01-30 ENCOUNTER — Encounter (HOSPITAL_COMMUNITY): Payer: Self-pay | Admitting: Emergency Medicine

## 2021-01-30 ENCOUNTER — Other Ambulatory Visit: Payer: Self-pay

## 2021-01-30 ENCOUNTER — Ambulatory Visit (HOSPITAL_COMMUNITY)
Admission: EM | Admit: 2021-01-30 | Discharge: 2021-01-30 | Disposition: A | Discharge status: Home / Self Care | Payer: Medicaid Other | Attending: Physician Assistant | Admitting: Physician Assistant

## 2021-01-30 DIAGNOSIS — R051 Acute cough: Secondary | ICD-10-CM | POA: Diagnosis not present

## 2021-01-30 DIAGNOSIS — J209 Acute bronchitis, unspecified: Secondary | ICD-10-CM

## 2021-01-30 MED ORDER — PREDNISONE 20 MG PO TABS: 40.0000 mg | ORAL_TABLET | Freq: Every day | ORAL | 0 refills | Status: AC

## 2021-01-30 MED ORDER — PROMETHAZINE-DM 6.25-15 MG/5ML PO SYRP: 5.0000 mL | ORAL_SOLUTION | Freq: Three times a day (TID) | ORAL | 0 refills | Status: DC | PRN

## 2021-01-30 NOTE — ED Provider Notes (Signed)
[place]    CSN: [id number] Arrival date & time: 01/30/21  0802      History   Chief Complaint Chief Complaint  Patient presents with   Hoarse   Cough    HPI [name] is a 51 y.o. female.   Patient presents today with a 5-day history of URI symptoms.  She reports coughing, nasal congestion, hoarseness, sore throat, fatigue, malaise.  Denies any fever, chest pain, shortness of breath, nausea, vomiting.  She has tried over-the-counter medication including Tylenol Cold and flu without improvement of symptoms.  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  She has not had influenza vaccine but has had COVID vaccination; has not had boosters.  Denies any history of asthma, allergies, smoking, COPD.  She is confident she is not pregnant.  Denies any recent antibiotic use.   Past Medical History:  Diagnosis Date   B12 deficiency    Elevated blood pressure reading    Fibroids, submucosal    History of blood transfusion 02/19/2011   [facility] - ? 2-3 units transfused   Hypothyroidism    Iron deficiency anemia    Vitamin D deficiency     Patient Active Problem List   Diagnosis Date Noted   Viral URI with cough 06/03/2020   Vitamin B12 deficiency 05/12/2020   Pernicious anemia 01/02/2020   Vitamin D deficiency 12/31/2019   Hashimoto's thyroiditis 12/31/2019   Anxiety 12/19/2019   Radiculopathy 04/26/2019   Cervical myelopathy ([place]) 03/07/2019   Migraine 02/19/2019   Shoulder pain, left 12/07/2018   Iron deficiency anemia 11/07/2018   Elevated blood pressure reading 11/07/2018   Preventative health care 11/07/2018   Hematometra 01/09/2013   Menorrhagia 01/09/2013   Chronic pelvic pain in female 01/09/2013    Past Surgical History:  Procedure Laterality Date   ANTERIOR CERVICAL DECOMPRESSION/DISCECTOMY FUSION 4 LEVELS N/A 04/26/2019   Procedure: ANTERIOR CERVICAL DECOMPRESSION FUSION CERVICAL 4-5, CERVICAL 5-6,  CERVICAL 6-7 WITH INSTRUMENTATION AND ALLOGRAFT;  Surgeon: Phylliss Bob, MD;  Location: [place];  Service: Orthopedics;  Laterality: N/A;   APPENDECTOMY     BILATERAL SALPINGECTOMY Bilateral 04/16/2013   Procedure: BILATERAL SALPINGECTOMY;  Surgeon: Lavonia Drafts, MD;  Location: [place] [facility];  Service: Gynecology;  Laterality: Bilateral;   CESAREAN SECTION  1994, 2002   DILATION AND CURETTAGE OF UTERUS N/A 03/31/2012   Procedure: DILATATION AND CURETTAGE;  Surgeon: Emily Filbert, MD;  Location: [place] [facility];  Service: Gynecology;  Laterality: N/A;   LAPAROSCOPIC LYSIS OF ADHESIONS N/A 03/31/2012   Procedure: LAPAROSCOPIC LYSIS OF ADHESIONS;  Surgeon: Emily Filbert, MD;  Location: [place] [facility];  Service: Gynecology;  Laterality: N/A;   LAPAROSCOPY N/A 03/31/2012   Procedure: LAPAROSCOPY OPERATIVE;  Surgeon: Emily Filbert, MD;  Location: [place] [facility];  Service: Gynecology;  Laterality: N/A;   NOVASURE ABLATION N/A 03/31/2012   Procedure: NOVASURE ABLATION;  Surgeon: Emily Filbert, MD;  Location: [place] [facility];  Service: Gynecology;  Laterality: N/A;   ROBOTIC ASSISTED TOTAL HYSTERECTOMY N/A 04/16/2013   Procedure: ROBOTIC ASSISTED TOTAL HYSTERECTOMY;  Surgeon: Lavonia Drafts, MD;  Location: [place] [facility];  Service: Gynecology;  Laterality: N/A;   TUBAL LIGATION  2002   WISDOM TOOTH EXTRACTION      OB History     Gravida  3   Para  2   Term  2   Preterm      AB  1   Living  2  SAB  1   IAB      Ectopic      Multiple      Live Births               Home Medications    Prior to Admission medications   Medication Sig Start Date End Date Taking? Authorizing Provider  predniSONE (DELTASONE) 20 MG tablet Take 2 tablets (40 mg total) by mouth daily with breakfast for 5 days. 01/30/21 02/04/21 Yes Delvina Mizzell K, PA-C  promethazine-dextromethorphan (PROMETHAZINE-DM) 6.25-15 MG/5ML syrup Take 5 mLs by mouth 3 (three) times daily as needed for cough. 01/30/21  Yes Tinesha Siegrist, Derry Skill, PA-C  acetaminophen  (TYLENOL) 500 MG tablet Take 2 tablets (1,000 mg total) by mouth every 6 (six) hours as needed for moderate pain or headache. 02/25/20   Cato Mulligan, MD  Cholecalciferol (VITAMIN D3) 1.25 MG (50000 UT) CAPS Take 50,000 Units by mouth every 7 (seven) days. 10/23/20   Wayland Denis, MD  ferrous sulfate 325 (65 FE) MG tablet Take 1 tablet (325 mg total) by mouth daily with breakfast. 05/13/20   Lacinda Axon, MD  fluticasone (FLONASE) 50 MCG/ACT nasal spray Place 1 spray into both nostrils daily. 1 spray in each nostril every day 06/03/20   Daisy Floro, DO  levothyroxine (SYNTHROID) 50 MCG tablet Take 1 tablet (50 mcg total) by mouth daily. 08/15/20 11/13/20  Maudie Mercury, MD  meloxicam (MOBIC) 7.5 MG tablet Take 1 tablet (7.5 mg total) by mouth daily. 02/22/20   Zigmund Gottron, NP  pseudoephedrine-acetaminophen (TYLENOL SINUS) 30-500 MG TABS tablet Take 1 tablet by mouth every 4 (four) hours as needed.    [provider]  tiZANidine (ZANAFLEX) 2 MG tablet Take 1-2 tablets (2-4 mg total) by mouth at bedtime. 02/22/20   Zigmund Gottron, NP  azelastine (ASTELIN) 0.1 % nasal spray Place 1 spray into both nostrils 2 (two) times daily. Use in each nostril as directed Patient not taking: Reported on 03/05/2019 12/01/17 12/24/19  Shella Maxim, NP  DULoxetine (CYMBALTA) 30 MG capsule Take 1 capsule (30 mg total) by mouth daily. 12/19/19 12/24/19  Seawell, Jaimie A, DO  gabapentin (NEURONTIN) 300 MG capsule TAKE 1 CAPSULE(300 MG) BY MOUTH TWICE DAILY Patient not taking: Reported on 08/14/2019 05/23/19 12/24/19  Maudie Mercury, MD    Family History Family History  Problem Relation Age of Onset   Hypertension Mother    Seizures Father    Fibroids Sister     Social History Social History   Tobacco Use   Smoking status: Former    Packs/day: 0.00    Types: Cigarettes    Quit date: 01/21/2011    Years since quitting: 10.0   Smokeless tobacco: Never  Vaping Use   Vaping Use: Never used   Substance Use Topics   Alcohol use: Yes    Comment: social occasions   Drug use: No     Allergies   Bee venom, Bactrim [sulfamethoxazole-trimethoprim], Codeine, and Sulfamethoxazole-trimethoprim   Review of Systems Review of Systems  Constitutional:  Positive for activity change and fatigue. Negative for appetite change and fever.  HENT:  Positive for congestion and sore throat. Negative for sinus pressure and sneezing.   Respiratory:  Positive for cough. Negative for shortness of breath.   Cardiovascular:  Negative for chest pain.  Gastrointestinal:  Negative for abdominal pain, diarrhea, nausea and vomiting.  Musculoskeletal:  Negative for arthralgias and myalgias.  Neurological:  Positive for headaches. Negative for dizziness and light-headedness.  Physical Exam Triage Vital Signs ED Triage Vitals  Enc Vitals Group     BP 01/30/21 0822 (!) 137/93     Pulse Rate 01/30/21 0822 81     Resp 01/30/21 0822 18     Temp 01/30/21 0822 98.8 F (37.1 C)     Temp Source 01/30/21 0822 Oral     SpO2 01/30/21 0822 97 %     Weight --      Height --      Head Circumference --      Peak Flow --      Pain Score 01/30/21 0821 7     Pain Loc --      Pain Edu? --      Excl. in [place]? --    No data found.  Updated Vital Signs BP (!) 137/93 (BP Location: Right Arm)    Pulse 81    Temp 98.8 F (37.1 C) (Oral)    Resp 18    LMP 03/31/2013 (Exact Date)    SpO2 97%   Visual Acuity Right Eye Distance:   Left Eye Distance:   Bilateral Distance:    Right Eye Near:   Left Eye Near:    Bilateral Near:     Physical Exam Vitals reviewed.  Constitutional:      General: She is awake. She is not in acute distress.    Appearance: Normal appearance. She is well-developed. She is not ill-appearing.     Comments: Very pleasant female appears stated age in no acute distress sitting comfortably in exam room  HENT:     Head: Normocephalic and atraumatic.     Right Ear: Tympanic membrane, ear  canal and external ear normal. Tympanic membrane is not erythematous or bulging.     Left Ear: Tympanic membrane, ear canal and external ear normal. Tympanic membrane is not erythematous or bulging.     Nose:     Right Sinus: Maxillary sinus tenderness present. No frontal sinus tenderness.     Left Sinus: Maxillary sinus tenderness present. No frontal sinus tenderness.     Mouth/Throat:     Pharynx: Uvula midline. Posterior oropharyngeal erythema present. No oropharyngeal exudate.     Comments: Erythema and drainage in posterior pharynx Cardiovascular:     Rate and Rhythm: Normal rate and regular rhythm.     Heart sounds: Normal heart sounds, S1 normal and S2 normal. No murmur heard. Pulmonary:     Effort: Pulmonary effort is normal.     Breath sounds: Normal breath sounds. No wheezing, rhonchi or rales.     Comments: Clear to auscultation bilaterally Psychiatric:        Behavior: Behavior is cooperative.     UC Treatments / Results  Labs (all labs ordered are listed, but only abnormal results are displayed) Labs Reviewed - No data to display  EKG   Radiology No results found.  Procedures Procedures (including critical care time)  Medications Ordered in UC Medications - No data to display  Initial Impression / Assessment and Plan / UC Course  I have reviewed the triage vital signs and the nursing notes.  Pertinent labs & imaging results that were available during my care of the patient were reviewed by me and considered in my medical decision making (see chart for details).     Discussed likely viral etiology given short duration of symptoms.  No indication for viral testing given patient has been symptomatic for 5+ days and this would not change management.  No  evidence of acute infection on physical exam that would warrant initiation of antibiotics.  She was started on prednisone burst (40 mg in 5 days) with instruction not to take NSAIDs with this medication due to risk  of GI bleeding.  She was prescribed Promethazine DM for cough with instruction not to drive or drink alcohol with this medication due to risk of sedation.  She can use Mucinex, Flonase, Tylenol for symptom relief.  She is to rest and drink plenty of fluid.  Discussed that if symptoms or not improving within a week she should return here or see her PCP for reevaluation.  Discussed alarm symptoms that warrant emergent evaluation including fever not responding to medication, chest pain, shortness of breath, nausea/vomiting interfering with oral intake.  Strict return precautions given to which she expressed understanding.  Final Clinical Impressions(s) / UC Diagnoses   Final diagnoses:  Acute bronchitis, unspecified organism  Acute cough     Discharge Instructions      Start prednisone burst (40 mg x 5 days).  Do not take NSAIDs including aspirin, ibuprofen/Advil, naproxen/Aleve with this medication as the combination can cause stomach bleeding.  You can use Tylenol, Mucinex, Flonase for symptom relief.  Use Promethazine DM for cough.  This will make you sleepy so do not drive or drink alcohol while taking it.  Make sure you rest and drink plenty of fluid.  If you are not improving within a week please return here or see your PCP.  If at any point symptoms worsen and you develop high fever, chest pain, shortness of breath, nausea/vomiting interfering with oral intake you need to go to the emergency room.     ED Prescriptions     Medication Sig Dispense Auth. Provider   promethazine-dextromethorphan (PROMETHAZINE-DM) 6.25-15 MG/5ML syrup Take 5 mLs by mouth 3 (three) times daily as needed for cough. 118 mL Katrinia Straker K, PA-C   predniSONE (DELTASONE) 20 MG tablet Take 2 tablets (40 mg total) by mouth daily with breakfast for 5 days. 10 tablet Orestes Geiman, Derry Skill, PA-C      PDMP not reviewed this encounter.   Terrilee Croak, PA-C 01/30/21 [phone]

## 2021-01-30 NOTE — ED Triage Notes (Signed)
Pt reports past two days didn't have a voice. Coughing so much that ears and neck are hurting. Reports that been doing teas. All symptoms started on Sunday and been taking OTC meds.

## 2021-01-30 NOTE — Discharge Instructions (Signed)
Start prednisone burst (40 mg x 5 days).  Do not take NSAIDs including aspirin, ibuprofen/Advil, naproxen/Aleve with this medication as the combination can cause stomach bleeding.  You can use Tylenol, Mucinex, Flonase for symptom relief.  Use Promethazine DM for cough.  This will make you sleepy so do not drive or drink alcohol while taking it.  Make sure you rest and drink plenty of fluid.  If you are not improving within a week please return here or see your PCP.  If at any point symptoms worsen and you develop high fever, chest pain, shortness of breath, nausea/vomiting interfering with oral intake you need to go to the emergency room.

## 2021-02-01 ENCOUNTER — Telehealth (HOSPITAL_COMMUNITY): Payer: Self-pay | Admitting: *Deleted

## 2021-02-01 NOTE — Telephone Encounter (Signed)
TC to Pt to review Meds given on Friday. Pt voiced understanding on info on Meds.

## 2021-02-02 ENCOUNTER — Encounter: Payer: Medicaid Other | Admitting: Internal Medicine

## 2021-02-04 ENCOUNTER — Other Ambulatory Visit: Payer: Self-pay

## 2021-02-04 ENCOUNTER — Ambulatory Visit (INDEPENDENT_AMBULATORY_CARE_PROVIDER_SITE_OTHER): Payer: Medicaid Other | Admitting: Internal Medicine

## 2021-02-04 ENCOUNTER — Encounter: Payer: Self-pay | Admitting: Internal Medicine

## 2021-02-04 VITALS — BP 125/71 | HR 73 | Temp 98.6°F | Ht 66.0 in | Wt 251.4 lb

## 2021-02-04 DIAGNOSIS — E538 Deficiency of other specified B group vitamins: Secondary | ICD-10-CM

## 2021-02-04 DIAGNOSIS — R7309 Other abnormal glucose: Secondary | ICD-10-CM | POA: Diagnosis not present

## 2021-02-04 DIAGNOSIS — E063 Autoimmune thyroiditis: Secondary | ICD-10-CM

## 2021-02-04 DIAGNOSIS — E559 Vitamin D deficiency, unspecified: Secondary | ICD-10-CM

## 2021-02-04 LAB — GLUCOSE, CAPILLARY: Glucose-Capillary: 101 mg/dL — ABNORMAL HIGH (ref 70–99)

## 2021-02-04 LAB — POCT GLYCOSYLATED HEMOGLOBIN (HGB A1C): Hemoglobin A1C: 5.6 % (ref 4.0–5.6)

## 2021-02-04 NOTE — Patient Instructions (Signed)
To Ms. O[place],  It was a pleasure seeing you today! Today we discussed your vitamin B12 deficiency, vitamin D deficiency, elevated blood sugar at work, and hypothyroidism.   For your vitamin B12 deficiency, we will check your level today, which will help [place] with our next steps.   For your vitamin D deficiecny, we will additionally draw that level today, which will help [place] with out next dosage.    For your elevated blood sugars at work, we will check an A1c, which gives [place] an average of your blood sugars.  Lastly, we will recheck your thyroid levels today.   I will see you back in 4 months time! Have a good day,  Maudie Mercury, MD

## 2021-02-04 NOTE — Progress Notes (Signed)
° °  CC: Vitamin B12 deficiency, Vitamin D deficiency, Hypothyroid, Abnormal blood sugar at work  HPI:  [name] is a 51 y.o. person, with a PMH noted below, who presents to the clinic for a routine follow up and having an abnormal glucose reading at work. To see the management of their acute and chronic conditions, please see the A&P note under the Encounters tab.   Past Medical History:  Diagnosis Date   B12 deficiency    Elevated blood pressure reading    Fibroids, submucosal    History of blood transfusion 02/19/2011   [facility] - ? 2-3 units transfused   Hypothyroidism    Iron deficiency anemia    Vitamin D deficiency    Review of Systems:   Review of Systems  Constitutional:  Negative for chills, fever, malaise/fatigue and weight loss.  HENT:  Negative for ear pain, hearing loss and tinnitus.   Eyes:  Negative for blurred vision.  Respiratory:  Negative for cough, hemoptysis, sputum production and shortness of breath.   Cardiovascular:  Negative for chest pain and palpitations.  Gastrointestinal:  Negative for abdominal pain, blood in stool, constipation, diarrhea, nausea and vomiting.  Neurological:  Negative for dizziness and headaches.    Physical Exam:  Vitals:   02/04/21 1314  BP: 125/71  Pulse: 73  Temp: 98.6 F (37 C)  TempSrc: Oral  SpO2: 98%  Weight: 251 lb 6.4 oz (114 kg)  Height: 5\' 6"  (1.676 m)   Physical Exam Constitutional:      General: She is not in acute distress.    Appearance: Normal appearance. She is not ill-appearing, toxic-appearing or diaphoretic.  Cardiovascular:     Rate and Rhythm: Normal rate and regular rhythm.     Pulses: Normal pulses.     Heart sounds: Normal heart sounds. No murmur heard.   No friction rub. No gallop.  Pulmonary:     Effort: Pulmonary effort is normal.     Breath sounds: Normal breath sounds.  Abdominal:     General: Abdomen is flat. Bowel sounds are normal. There is no distension.     Palpations:  Abdomen is soft. There is no mass.     Tenderness: There is no abdominal tenderness. There is no right CVA tenderness, left CVA tenderness, guarding or rebound.     Hernia: No hernia is present.  Neurological:     Mental Status: She is alert and oriented to person, place, and time.  Psychiatric:        Mood and Affect: Mood normal.        Behavior: Behavior normal.     Assessment & Plan:   See Encounters Tab for problem based charting.  Patient discussed with Dr. Evette Doffing

## 2021-02-06 ENCOUNTER — Encounter: Payer: Self-pay | Admitting: Internal Medicine

## 2021-02-06 DIAGNOSIS — R7309 Other abnormal glucose: Secondary | ICD-10-CM | POA: Insufficient documentation

## 2021-02-06 LAB — VITAMIN B12: Vitamin B-12: 290 pg/mL (ref 232–1245)

## 2021-02-06 LAB — VITAMIN D 25 HYDROXY (VIT D DEFICIENCY, FRACTURES): Vit D, 25-Hydroxy: 19.6 ng/mL — ABNORMAL LOW (ref 30.0–100.0)

## 2021-02-06 LAB — TSH: TSH: 1.73 u[IU]/mL (ref 0.450–4.500)

## 2021-02-06 NOTE — Assessment & Plan Note (Signed)
>>  ASSESSMENT AND PLAN FOR VITAMIN B12 DEFICIENCY WRITTEN ON 02/06/2021  8:48 AM BY Maudie Mercury, MD  Recheck B12 level today, she recently completed her B12 injection course. We discussed that she will likely need continual replacement and that we should transition to oral supplements, which [name] is agreeable with trying.  - B12 Level  - Start B12 2000 mcg

## 2021-02-06 NOTE — Assessment & Plan Note (Signed)
Patient presents to the clinic today with concerns for diabetes after having an abnormal blood sugar lab at a workplace health screen. She does not recall if the test was an A1c, and there is no record. Patient does not have a FH or DM. She is currently working on lifestyle changes for a BMI of 40. Will repeat screening today.  - A1c  Addendum:  A1c of 5.6, discussed that she is borderline to being within prediabetic range. She is going to continue on lifestyle changes.

## 2021-02-06 NOTE — Assessment & Plan Note (Addendum)
Patient presents for follow up on her Vitamin D deficiency. Last Vitamin D level was 14. 19.6 on repeat, she did not tolerate the 50,000 weekly dose, but we discussed daily dosing of Vitamin D which she is going to start.  - Vitamin D 2000 UT daily - Follow up at next PCP visit

## 2021-02-06 NOTE — Assessment & Plan Note (Addendum)
Recheck B12 level today, she recently completed her B12 injection course. We discussed that she will likely need continual replacement and that we should transition to oral supplements, which Ms. Glasner is agreeable with trying.  - B12 Level  - Start B12 2000 mcg

## 2021-02-06 NOTE — Assessment & Plan Note (Signed)
TSH Today is 1.7, will continue current regimen.  - Levothyroxine 50 mcg daily -TSH at next PCP visit

## 2021-02-09 ENCOUNTER — Other Ambulatory Visit: Payer: Self-pay | Admitting: Internal Medicine

## 2021-02-09 NOTE — Progress Notes (Signed)
Internal Medicine Clinic Attending ? ?Case discussed with Dr. Winters  At the time of the visit.  We reviewed the resident?s history and exam and pertinent patient test results.  I agree with the assessment, diagnosis, and plan of care documented in the resident?s note.  ?

## 2021-02-26 ENCOUNTER — Telehealth: Payer: Self-pay | Admitting: Internal Medicine

## 2021-02-26 DIAGNOSIS — Z1231 Encounter for screening mammogram for malignant neoplasm of breast: Secondary | ICD-10-CM

## 2021-02-26 NOTE — Telephone Encounter (Signed)
Pt seen 02/04/2021 and states she was to have a mammogram order placed for her yearly exam.  Pt also reports she was having right breast pain at the time of her visit.  Please advise if an order can be placed or if an appt is needed.  The pt was unsuccessful at sch a regular mammogram due to right [facility].  Pt was instructed to contact her PCP's office to have the correct order placed.

## 2021-02-27 ENCOUNTER — Other Ambulatory Visit: Payer: Self-pay | Admitting: Internal Medicine

## 2021-02-27 DIAGNOSIS — N644 Mastodynia: Secondary | ICD-10-CM

## 2021-03-13 ENCOUNTER — Other Ambulatory Visit: Payer: Medicaid Other

## 2021-03-17 ENCOUNTER — Other Ambulatory Visit: Payer: Self-pay | Admitting: *Deleted

## 2021-03-17 DIAGNOSIS — E038 Other specified hypothyroidism: Secondary | ICD-10-CM

## 2021-03-17 MED ORDER — LEVOTHYROXINE SODIUM 50 MCG PO TABS: 50.0000 ug | ORAL_TABLET | Freq: Every day | ORAL | 2 refills | Status: DC

## 2021-03-23 ENCOUNTER — Ambulatory Visit: Admission: RE | Admit: 2021-03-23 | Payer: Medicaid Other | Source: Ambulatory Visit

## 2021-03-23 ENCOUNTER — Ambulatory Visit
Admission: RE | Admit: 2021-03-23 | Discharge: 2021-03-23 | Disposition: A | Discharge status: Home / Self Care | Payer: Medicaid Other | Source: Ambulatory Visit | Attending: Internal Medicine | Admitting: Internal Medicine

## 2021-03-23 ENCOUNTER — Other Ambulatory Visit: Payer: Self-pay

## 2021-03-23 DIAGNOSIS — R922 Inconclusive mammogram: Secondary | ICD-10-CM | POA: Diagnosis not present

## 2021-03-23 DIAGNOSIS — N644 Mastodynia: Secondary | ICD-10-CM

## 2021-04-13 ENCOUNTER — Encounter: Payer: Medicaid Other | Admitting: Internal Medicine

## 2021-04-13 NOTE — Progress Notes (Deleted)
Pain under breast area for last 2 months ?Bilateral mammogram completed 3/6. Showed no focal or suspicious mammographic findings. ? ?Vit D ?Low at 19.6 02/04/21. Started daily dosing of 2000 units daily ?A/P: ?Recheck vitamin D ? ?Update medications ? ?Care gaps: ?Covid ?Flu ?Colonoscopy ?

## 2021-04-15 ENCOUNTER — Encounter: Payer: Medicaid Other | Admitting: Internal Medicine

## 2021-04-15 NOTE — Progress Notes (Deleted)
51 yo person w cervical myelopathy, hypothyroidism, ? ?Pain under r breast ?Mammogram 3/6 with no mammographic evidence of malignancy. ? ?Care gaps: ?Td ?Colonoscopy ?Flu ?covid ? ?

## 2021-05-27 DIAGNOSIS — H5213 Myopia, bilateral: Secondary | ICD-10-CM | POA: Diagnosis not present

## 2021-06-15 ENCOUNTER — Ambulatory Visit (INDEPENDENT_AMBULATORY_CARE_PROVIDER_SITE_OTHER): Payer: Medicaid Other

## 2021-06-15 ENCOUNTER — Encounter (HOSPITAL_COMMUNITY): Payer: Self-pay

## 2021-06-15 ENCOUNTER — Ambulatory Visit (HOSPITAL_COMMUNITY)
Admission: EM | Admit: 2021-06-15 | Discharge: 2021-06-15 | Disposition: A | Discharge status: Home / Self Care | Payer: Medicaid Other | Attending: Sports Medicine | Admitting: Sports Medicine

## 2021-06-15 DIAGNOSIS — M79671 Pain in right foot: Secondary | ICD-10-CM

## 2021-06-15 DIAGNOSIS — M7732 Calcaneal spur, left foot: Secondary | ICD-10-CM | POA: Diagnosis not present

## 2021-06-15 DIAGNOSIS — M7731 Calcaneal spur, right foot: Secondary | ICD-10-CM | POA: Diagnosis not present

## 2021-06-15 DIAGNOSIS — M25572 Pain in left ankle and joints of left foot: Secondary | ICD-10-CM

## 2021-06-15 DIAGNOSIS — M79672 Pain in left foot: Secondary | ICD-10-CM | POA: Diagnosis not present

## 2021-06-15 MED ORDER — PREDNISONE 20 MG PO TABS: 20.0000 mg | ORAL_TABLET | Freq: Two times a day (BID) | ORAL | 0 refills | Status: AC

## 2021-06-15 NOTE — ED Provider Notes (Signed)
[facility]    CSN: [id number] Arrival date & time: 06/15/21  0801      History   Chief Complaint CC: heel pain  HPI [name] is a 51 y.o. female here for right heel pain and redness since Saturday.  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 2-3 units transfused   Hypothyroidism    Iron deficiency anemia    Vitamin D deficiency     Patient Active Problem List   Diagnosis Date Noted   Abnormal blood sugar 02/06/2021   Viral URI with cough 06/03/2020   Vitamin B12 deficiency 05/12/2020   Pernicious anemia 01/02/2020   Vitamin D deficiency 12/31/2019   Hashimoto's thyroiditis 12/31/2019   Anxiety 12/19/2019   Radiculopathy 04/26/2019   Cervical myelopathy ([place]) 03/07/2019   Migraine 02/19/2019   Shoulder pain, left 12/07/2018   Iron deficiency anemia 11/07/2018   Elevated blood pressure reading 11/07/2018   Preventative health care 11/07/2018   Hematometra 01/09/2013   Menorrhagia 01/09/2013   Chronic pelvic pain in female 01/09/2013    Past Surgical History:  Procedure Laterality Date   ANTERIOR CERVICAL DECOMPRESSION/DISCECTOMY FUSION 4 LEVELS N/A 04/26/2019   Procedure: ANTERIOR CERVICAL DECOMPRESSION FUSION CERVICAL 4-5, CERVICAL 5-6, CERVICAL 6-7 WITH INSTRUMENTATION AND ALLOGRAFT;  Surgeon: Phylliss Bob, MD;  Location: [facility];  Service: Orthopedics;  Laterality: N/A;   APPENDECTOMY     BILATERAL SALPINGECTOMY  Bilateral 04/16/2013   Procedure: BILATERAL SALPINGECTOMY;  Surgeon: Lavonia Drafts, MD;  Location: [facility];  Service: Gynecology;  Laterality: Bilateral;   CESAREAN SECTION  1994, 2002   DILATION AND CURETTAGE OF UTERUS N/A 03/31/2012   Procedure: DILATATION AND CURETTAGE;  Surgeon: Emily Filbert, MD;  Location: [facility];  Service: Gynecology;  Laterality: N/A;   LAPAROSCOPIC LYSIS OF ADHESIONS N/A 03/31/2012   Procedure: LAPAROSCOPIC LYSIS OF ADHESIONS;  Surgeon: Emily Filbert, MD;  Location: [facility];  Service: Gynecology;  Laterality: N/A;   LAPAROSCOPY N/A 03/31/2012   Procedure: LAPAROSCOPY OPERATIVE;  Surgeon: Emily Filbert, MD;  Location: [facility];  Service: Gynecology;  Laterality: N/A;   NOVASURE ABLATION N/A 03/31/2012   Procedure: NOVASURE ABLATION;  Surgeon: Emily Filbert, MD;  Location: [facility];  Service: Gynecology;  Laterality: N/A;   ROBOTIC ASSISTED TOTAL HYSTERECTOMY N/A 04/16/2013   Procedure: ROBOTIC ASSISTED TOTAL HYSTERECTOMY;  Surgeon: Lavonia Drafts, MD;  Location: [facility];  Service: Gynecology;  Laterality: N/A;   TUBAL LIGATION  2002   WISDOM TOOTH EXTRACTION      OB History     Gravida  3   Para  2   Term  2   Preterm      AB  1   Living  2      SAB  1   IAB      Ectopic      Multiple      Live Births  Home Medications    Prior to Admission medications   Medication Sig Start Date End Date Taking? Authorizing Provider  predniSONE (DELTASONE) 20 MG tablet Take 1 tablet (20 mg total) by mouth in the morning and at bedtime for 6 days. 06/15/21 06/21/21 Yes Elba Barman, DO  acetaminophen (TYLENOL) 500 MG tablet Take 2 tablets (1,000 mg total) by mouth every 6 (six) hours as needed for moderate pain or headache. 02/25/20   Cato Mulligan, MD  Cholecalciferol (VITAMIN D3) 1.25 MG (50000 UT) CAPS Take 50,000 Units by mouth every 7 (seven) days. 10/23/20   Wayland Denis, MD  ferrous sulfate 325 (65 FE) MG tablet Take 1 tablet (325 mg  total) by mouth daily with breakfast. 05/13/20   Lacinda Axon, MD  fluticasone (FLONASE) 50 MCG/ACT nasal spray Place 1 spray into both nostrils daily. 1 spray in each nostril every day 06/03/20   Daisy Floro, DO  levothyroxine (SYNTHROID) 50 MCG tablet Take 1 tablet (50 mcg total) by mouth daily. 03/17/21 06/15/21  Maudie Mercury, MD  meloxicam (MOBIC) 7.5 MG tablet Take 1 tablet (7.5 mg total) by mouth daily. 02/22/20   Zigmund Gottron, NP  promethazine-dextromethorphan (PROMETHAZINE-DM) 6.25-15 MG/5ML syrup Take 5 mLs by mouth 3 (three) times daily as needed for cough. 01/30/21   Raspet, Derry Skill, PA-C  pseudoephedrine-acetaminophen (TYLENOL SINUS) 30-500 MG TABS tablet Take 1 tablet by mouth every 4 (four) hours as needed.    [provider]  tiZANidine (ZANAFLEX) 2 MG tablet Take 1-2 tablets (2-4 mg total) by mouth at bedtime. 02/22/20   Zigmund Gottron, NP  azelastine (ASTELIN) 0.1 % nasal spray Place 1 spray into both nostrils 2 (two) times daily. Use in each nostril as directed Patient not taking: Reported on 03/05/2019 12/01/17 12/24/19  Shella Maxim, NP  DULoxetine (CYMBALTA) 30 MG capsule Take 1 capsule (30 mg total) by mouth daily. 12/19/19 12/24/19  Seawell, Jaimie A, DO  gabapentin (NEURONTIN) 300 MG capsule TAKE 1 CAPSULE(300 MG) BY MOUTH TWICE DAILY Patient not taking: Reported on 08/14/2019 05/23/19 12/24/19  Maudie Mercury, MD    Family History Family History  Problem Relation Age of Onset   Hypertension Mother    Seizures Father    Fibroids Sister    Breast cancer Neg Hx     Social History Social History   Tobacco Use   Smoking status: Former    Packs/day: 0.00    Types: Cigarettes    Quit date: 01/21/2011    Years since quitting: 10.4   Smokeless tobacco: Never  Vaping Use   Vaping Use: Never used  Substance Use Topics   Alcohol use: Yes    Comment: social occasions   Drug use: No     Allergies   Bee venom, Bactrim [sulfamethoxazole-trimethoprim],  Codeine, and Sulfamethoxazole-trimethoprim   Review of Systems Review of Systems  Constitutional:  Negative for chills and fever.  Musculoskeletal:  Positive for arthralgias (right heel pain) and joint swelling. Negative for myalgias.  Skin:  Positive for color change (redness to heel).    Physical Exam Triage Vital Signs ED Triage Vitals  Enc Vitals Group     BP 06/15/21 0815 (!) 147/92     Pulse Rate 06/15/21 0815 79     Resp 06/15/21 0815 18     Temp 06/15/21 0815 98.9 F (37.2 C)     Temp Source 06/15/21 0815 Oral     SpO2 06/15/21 0815 98 %     Weight --  Height --      Head Circumference --      Peak Flow --      Pain Score 06/15/21 0818 5     Pain Loc --      Pain Edu? --      Excl. in [place]? --    No data found.  Updated Vital Signs BP (!) 147/92 (BP Location: Left Arm)   Pulse 79   Temp 98.9 F (37.2 C) (Oral)   Resp 18   LMP 03/31/2013 (Exact Date)   SpO2 98%    Physical Exam Gen: Well-appearing, in no acute distress; non-toxic CV: Regular Rate. Well-perfused. Warm.  Resp: Breathing unlabored on room air; no wheezing. Psych: Fluid speech in conversation; appropriate affect; normal thought process Neuro: Sensation intact throughout. No gross coordination deficits.  MSK:  - Right heel: + Tenderness to palpation throughout the heel.  There is some mild soft tissue swelling as well as warmth throughout the heel.  No TTP over the Achilles tendon.  Range of motion intact both active and passively to ankle.  5/5 strength.  Patient has difficulty ambulating with putting pressure on the calcaneus.  Neurovascular intact distally.   UC Treatments / Results  Labs (all labs ordered are listed, but only abnormal results are displayed) Labs Reviewed - No data to display  EKG   Radiology DG Ankle Complete Left  Result Date: 06/15/2021 CLINICAL DATA:  51 year old female with history of pain in the heel of the left foot. EXAM: LEFT ANKLE COMPLETE - 3+ VIEW  COMPARISON:  No priors. FINDINGS: There is no evidence of fracture, dislocation, or joint effusion. Plantar calcaneal spur. Soft tissues are unremarkable. IMPRESSION: 1. No acute radiographic abnormality of the left ankle. Electronically Signed   By: Vinnie Langton M.D.   On: 06/15/2021 08:56   DG Os Calcis Left  Result Date: 06/15/2021 CLINICAL DATA:  51 year old female with history of heel pain. EXAM: LEFT OS CALCIS - 2+ VIEW COMPARISON:  No priors. FINDINGS: There is no evidence of fracture or other focal bone lesions. Large plantar calcaneal spur with prominence of the overlying soft tissues. IMPRESSION: 1. Large plantar calcaneal spur with prominence of overlying soft tissues, which may suggest plantar fasciitis. Electronically Signed   By: Vinnie Langton M.D.   On: 06/15/2021 08:57    Procedures Procedures (including critical care time)  Medications Ordered in UC Medications - No data to display  Initial Impression / Assessment and Plan / UC Course  I have reviewed the triage vital signs and the nursing notes.  Pertinent labs & imaging results that were available during my care of the patient were reviewed by me and considered in my medical decision making (see chart for details).     Patient presents with 3 days of pain and inflammation of the right heel of insidious onset.  She does have some warmth and swelling about the soft tissue of the heel throughout on exam today.  She does have a rather large plantar calcaneal spur, although her exam suggest more of an inflammatory condition.  Could be plantar fasciitis, although her story does not quite fit this picture as much she has no history of gout however.  We will treat with prednisone 20 mg twice a day for the next 6 days.  She is to ice as well and I did give her some rehab stretching exercises for the plantar fascia.  If she is not better after 1 week or this recurs, recommend she  follow-up with her PCP or the sports medicine  center. Final Clinical Impressions(s) / UC Diagnoses   Final diagnoses:  Inflammatory pain of right heel  Calcaneal spur of right foot     Discharge Instructions      - Try to rest the foot over the next few days -You may try getting a heel pad/cushion over-the-counter -We will take prednisone 20 mg twice a day for the next 6 days -You may take Tylenol on top of this as well for pain -Recommend icing the heel at least 3-4 times a day for 15 minutes each -As this is feeling better would recommend doing some of the stretches for the plantar fascia as above -If not feeling better after 1 week or recurrent, would recommend following up with the sports medicine sent     ED Prescriptions     Medication Sig Dispense Auth. Provider   predniSONE (DELTASONE) 20 MG tablet Take 1 tablet (20 mg total) by mouth in the morning and at bedtime for 6 days. 12 tablet Elba Barman, DO      PDMP not reviewed this encounter.   Elba Barman, [place] 06/15/21 [phone]

## 2021-06-15 NOTE — ED Triage Notes (Signed)
C/o left foot pain x 2 days.

## 2021-06-15 NOTE — ED Triage Notes (Signed)
No known injury or falls.

## 2021-06-15 NOTE — Discharge Instructions (Addendum)
-   Try to rest the foot over the next few days -You may try getting a heel pad/cushion over-the-counter -We will take prednisone 20 mg twice a day for the next 6 days -You may take Tylenol on top of this as well for pain -Recommend icing the heel at least 3-4 times a day for 15 minutes each -As this is feeling better would recommend doing some of the stretches for the plantar fascia as above -If not feeling better after 1 week or recurrent, would recommend following up with the sports medicine sent

## 2021-06-16 DIAGNOSIS — H5213 Myopia, bilateral: Secondary | ICD-10-CM | POA: Diagnosis not present

## 2021-06-16 DIAGNOSIS — H52223 Regular astigmatism, bilateral: Secondary | ICD-10-CM | POA: Diagnosis not present

## 2021-06-22 ENCOUNTER — Ambulatory Visit (HOSPITAL_COMMUNITY)
Admission: EM | Admit: 2021-06-22 | Discharge: 2021-06-22 | Disposition: A | Discharge status: Home / Self Care | Payer: Medicaid Other | Attending: Sports Medicine | Admitting: Sports Medicine

## 2021-06-22 ENCOUNTER — Other Ambulatory Visit: Payer: Self-pay

## 2021-06-22 ENCOUNTER — Encounter (HOSPITAL_COMMUNITY): Payer: Self-pay | Admitting: Emergency Medicine

## 2021-06-22 DIAGNOSIS — M7731 Calcaneal spur, right foot: Secondary | ICD-10-CM | POA: Diagnosis not present

## 2021-06-22 DIAGNOSIS — M79671 Pain in right foot: Secondary | ICD-10-CM

## 2021-06-22 NOTE — ED Provider Notes (Signed)
[name]    CSN: [id number] Arrival date & time: 06/22/21  2671      History   Chief Complaint Chief Complaint  Patient presents with   Foot Pain    HPI [name] is a 51 y.o. female here for follow-up of right heel pain and swelling.   Foot Pain   Patient was seen for similar on 06/15/2021.  She does note that she is improving, but still has some pain over the right heel with ambulation and prolonged periods of standing.  She is currently taking the prednisone taper and has 1 or 2 more days left.  She has noticed an improvement in swelling and redness/warmth.  She has been icing elevating taking Tylenol.  She does stand all day for her job at work and has been unable to perform this for multiple hours of the day.  She denies any new injury.  Getting better - less painful to walk Still finishing prednisone, has 1-2 more days Taking tylenol as well Wears ACE compression wrap   Past Medical History:  Diagnosis Date   B12 deficiency    Elevated blood pressure reading    Fibroids, submucosal    History of blood transfusion 02/19/2011   [facility] - ? 2-3 units transfused   Hypothyroidism    Iron deficiency anemia    Vitamin D deficiency     Patient Active Problem List   Diagnosis Date Noted   Abnormal blood sugar 02/06/2021   Viral URI with cough 06/03/2020   Vitamin B12 deficiency 05/12/2020   Pernicious anemia 01/02/2020   Vitamin D deficiency 12/31/2019   Hashimoto's thyroiditis 12/31/2019   Anxiety 12/19/2019   Radiculopathy 04/26/2019   Cervical myelopathy ([place]) 03/07/2019   Migraine 02/19/2019   Shoulder pain, left 12/07/2018   Iron deficiency anemia 11/07/2018   Elevated blood pressure reading 11/07/2018   Preventative health care 11/07/2018   Hematometra 01/09/2013   Menorrhagia 01/09/2013   Chronic pelvic pain in female 01/09/2013    Past Surgical History:  Procedure Laterality Date   ANTERIOR CERVICAL DECOMPRESSION/DISCECTOMY FUSION 4  LEVELS N/A 04/26/2019   Procedure: ANTERIOR CERVICAL DECOMPRESSION FUSION CERVICAL 4-5, CERVICAL 5-6, CERVICAL 6-7 WITH INSTRUMENTATION AND ALLOGRAFT;  Surgeon: Phylliss Bob, MD;  Location: [facility];  Service: Orthopedics;  Laterality: N/A;   APPENDECTOMY     BILATERAL SALPINGECTOMY Bilateral 04/16/2013   Procedure: BILATERAL SALPINGECTOMY;  Surgeon: Lavonia Drafts, MD;  Location: [facility];  Service: Gynecology;  Laterality: Bilateral;   CESAREAN SECTION  1994, 2002   DILATION AND CURETTAGE OF UTERUS N/A 03/31/2012   Procedure: DILATATION AND CURETTAGE;  Surgeon: Emily Filbert, MD;  Location: [facility];  Service: Gynecology;  Laterality: N/A;   LAPAROSCOPIC LYSIS OF ADHESIONS N/A 03/31/2012   Procedure: LAPAROSCOPIC LYSIS OF ADHESIONS;  Surgeon: Emily Filbert, MD;  Location: [facility];  Service: Gynecology;  Laterality: N/A;   LAPAROSCOPY N/A 03/31/2012   Procedure: LAPAROSCOPY OPERATIVE;  Surgeon: Emily Filbert, MD;  Location: [facility];  Service: Gynecology;  Laterality: N/A;   NOVASURE ABLATION N/A 03/31/2012   Procedure: NOVASURE ABLATION;  Surgeon: Emily Filbert, MD;  Location: [facility];  Service: Gynecology;  Laterality: N/A;   ROBOTIC ASSISTED TOTAL HYSTERECTOMY N/A 04/16/2013   Procedure: ROBOTIC ASSISTED TOTAL HYSTERECTOMY;  Surgeon: Lavonia Drafts, MD;  Location: [facility];  Service: Gynecology;  Laterality: N/A;   TUBAL LIGATION  2002   WISDOM TOOTH EXTRACTION      OB History  Gravida  3   Para  2   Term  2   Preterm      AB  1   Living  2      SAB  1   IAB      Ectopic      Multiple      Live Births               Home Medications    Prior to Admission medications   Medication Sig Start Date End Date Taking? Authorizing Provider  acetaminophen (TYLENOL) 500 MG tablet Take 2 tablets (1,000 mg total) by mouth every 6 (six) hours as needed for moderate pain or headache. 02/25/20   Cato Mulligan, MD  Cholecalciferol (VITAMIN D3) 1.25 MG (50000 UT) CAPS Take 50,000  Units by mouth every 7 (seven) days. 10/23/20   Wayland Denis, MD  ferrous sulfate 325 (65 FE) MG tablet Take 1 tablet (325 mg total) by mouth daily with breakfast. 05/13/20   Lacinda Axon, MD  fluticasone (FLONASE) 50 MCG/ACT nasal spray Place 1 spray into both nostrils daily. 1 spray in each nostril every day 06/03/20   Daisy Floro, DO  levothyroxine (SYNTHROID) 50 MCG tablet Take 1 tablet (50 mcg total) by mouth daily. 03/17/21 06/15/21  Maudie Mercury, MD  meloxicam (MOBIC) 7.5 MG tablet Take 1 tablet (7.5 mg total) by mouth daily. Patient not taking: Reported on 06/22/2021 02/22/20   Zigmund Gottron, NP  promethazine-dextromethorphan (PROMETHAZINE-DM) 6.25-15 MG/5ML syrup Take 5 mLs by mouth 3 (three) times daily as needed for cough. Patient not taking: Reported on 06/22/2021 01/30/21   Raspet, Derry Skill, PA-C  pseudoephedrine-acetaminophen (TYLENOL SINUS) 30-500 MG TABS tablet Take 1 tablet by mouth every 4 (four) hours as needed. Patient not taking: Reported on 06/22/2021    [provider]  tiZANidine (ZANAFLEX) 2 MG tablet Take 1-2 tablets (2-4 mg total) by mouth at bedtime. Patient not taking: Reported on 06/22/2021 02/22/20   Augusto Gamble B, NP  azelastine (ASTELIN) 0.1 % nasal spray Place 1 spray into both nostrils 2 (two) times daily. Use in each nostril as directed Patient not taking: Reported on 03/05/2019 12/01/17 12/24/19  Shella Maxim, NP  DULoxetine (CYMBALTA) 30 MG capsule Take 1 capsule (30 mg total) by mouth daily. 12/19/19 12/24/19  Seawell, Jaimie A, DO  gabapentin (NEURONTIN) 300 MG capsule TAKE 1 CAPSULE(300 MG) BY MOUTH TWICE DAILY Patient not taking: Reported on 08/14/2019 05/23/19 12/24/19  Maudie Mercury, MD    Family History Family History  Problem Relation Age of Onset   Hypertension Mother    Seizures Father    Fibroids Sister    Breast cancer Neg Hx     Social History Social History   Tobacco Use   Smoking status: Former    Packs/day: 0.00    Types:  Cigarettes    Quit date: 01/21/2011    Years since quitting: 10.4   Smokeless tobacco: Never  Vaping Use   Vaping Use: Never used  Substance Use Topics   Alcohol use: Yes    Comment: social occasions   Drug use: No     Allergies   Bee venom, Bactrim [sulfamethoxazole-trimethoprim], Codeine, and Sulfamethoxazole-trimethoprim   Review of Systems Review of Systems  Constitutional:  Negative for activity change, chills and fever.  Musculoskeletal:  Positive for arthralgias (right heel pain) and gait problem.  Skin:  Negative for pallor and rash.    Physical Exam Triage Vital Signs ED Triage Vitals  Enc Vitals Group     BP 06/22/21 0828 133/85     Pulse Rate 06/22/21 0828 87     Resp 06/22/21 0828 18     Temp 06/22/21 0828 98.4 F (36.9 C)     Temp Source 06/22/21 0828 Oral     SpO2 06/22/21 0828 97 %     Weight --      Height --      Head Circumference --      Peak Flow --      Pain Score 06/22/21 0825 5     Pain Loc --      Pain Edu? --      Excl. in [place]? --    No data found.  Updated Vital Signs BP 133/85 (BP Location: Right Arm) Comment (BP Location): large cuff  Pulse 87   Temp 98.4 F (36.9 C) (Oral)   Resp 18   LMP 03/31/2013 (Exact Date)   SpO2 97%    Physical Exam Gen: Well-appearing, in no acute distress; non-toxic CV: Regular Rate. Well-perfused. Warm.  Resp: Breathing unlabored on room air; no wheezing. Psych: Fluid speech in conversation; appropriate affect; normal thought process Neuro: Sensation intact throughout. No gross coordination deficits.  MSK:  - Right foot/heel: + TTP over the plantar aspect of the right heel, more so on the fat pad but also some calcaneal tenderness.  No specific TTP near the insertion of the plantar fascia.  There is some very mild swelling, although no redness or warmth to the area.  Slightly limited dorsiflexion, otherwise full range of motion of the ankle in all directions.  5/5 strength in all directions.   Neurovascular intact distally.   UC Treatments / Results  Labs (all labs ordered are listed, but only abnormal results are displayed) Labs Reviewed - No data to display  EKG   Radiology No results found.  Procedures Procedures (including critical care time)  Medications Ordered in UC Medications - No data to display  Initial Impression / Assessment and Plan / UC Course  I have reviewed the triage vital signs and the nursing notes.  Pertinent labs & imaging results that were available during my care of the patient were reviewed by me and considered in my medical decision making (see chart for details).     Right heel pain - has been improving both subjectively for the patient and on my exam. Residual mild soft tissue swelling but no redness or inflammation. Did provided note for work to be off of prolonged standing for an additional week. I do think this will get her over the hump, but if unable to return I did state she should see orthopedics, information of local practices provided. Complete prednisone and RICE therapy. Recommend heel cup for padding and protection. Return precautions given. Final Clinical Impressions(s) / UC Diagnoses   Final diagnoses:  Inflammatory pain of right heel  Heel spur, right     Discharge Instructions      [place] - glad you are starting to feel better. Continue icing, elevating, medicine, and the stretches  - Would recommend trying a Heel Cup in the shoe when standing  - If pain not better > 1-week, would recommend you see Orthopedics, you may call as above     ED Prescriptions   None    PDMP not reviewed this encounter.   Elba Barman, [place] 06/22/21 [phone]

## 2021-06-22 NOTE — Discharge Instructions (Addendum)
[place] - glad you are starting to feel better. Continue icing, elevating, medicine, and the stretches  - Would recommend trying a Heel Cup in the shoe when standing  - If pain not better > 1-week, would recommend you see Orthopedics, you may call as above

## 2021-06-22 NOTE — ED Triage Notes (Signed)
Skipping, but remembers coming down hard on right heel.  This occurred about 9 days ago.  Patient was seen 5/29 for the same.  Pain is better, but still swells when on her feet for long times.  Patient is icing, taking tylenol.  Foot is improving, but not considered well yet

## 2021-07-08 ENCOUNTER — Ambulatory Visit (INDEPENDENT_AMBULATORY_CARE_PROVIDER_SITE_OTHER): Payer: Self-pay | Admitting: Bariatrics

## 2021-07-14 DIAGNOSIS — M722 Plantar fascial fibromatosis: Secondary | ICD-10-CM | POA: Diagnosis not present

## 2021-07-14 DIAGNOSIS — M79671 Pain in right foot: Secondary | ICD-10-CM | POA: Diagnosis not present

## 2021-07-14 DIAGNOSIS — M25571 Pain in right ankle and joints of right foot: Secondary | ICD-10-CM | POA: Diagnosis not present

## 2021-07-22 ENCOUNTER — Ambulatory Visit (INDEPENDENT_AMBULATORY_CARE_PROVIDER_SITE_OTHER): Payer: Medicaid Other | Admitting: Bariatrics

## 2021-07-22 ENCOUNTER — Encounter (INDEPENDENT_AMBULATORY_CARE_PROVIDER_SITE_OTHER): Payer: Self-pay | Admitting: Bariatrics

## 2021-07-22 VITALS — BP 144/73 | HR 66 | Temp 97.8°F | Ht 66.0 in | Wt 250.0 lb

## 2021-07-22 DIAGNOSIS — R7309 Other abnormal glucose: Secondary | ICD-10-CM | POA: Diagnosis not present

## 2021-07-22 DIAGNOSIS — Z0000 Encounter for general adult medical examination without abnormal findings: Secondary | ICD-10-CM

## 2021-07-22 DIAGNOSIS — Z1331 Encounter for screening for depression: Secondary | ICD-10-CM | POA: Diagnosis not present

## 2021-07-22 DIAGNOSIS — R0602 Shortness of breath: Secondary | ICD-10-CM

## 2021-07-22 DIAGNOSIS — Z6841 Body Mass Index (BMI) 40.0 and over, adult: Secondary | ICD-10-CM

## 2021-07-22 DIAGNOSIS — D508 Other iron deficiency anemias: Secondary | ICD-10-CM

## 2021-07-22 DIAGNOSIS — E063 Autoimmune thyroiditis: Secondary | ICD-10-CM | POA: Diagnosis not present

## 2021-07-22 DIAGNOSIS — E538 Deficiency of other specified B group vitamins: Secondary | ICD-10-CM

## 2021-07-22 DIAGNOSIS — E559 Vitamin D deficiency, unspecified: Secondary | ICD-10-CM | POA: Diagnosis not present

## 2021-07-22 DIAGNOSIS — D509 Iron deficiency anemia, unspecified: Secondary | ICD-10-CM | POA: Diagnosis not present

## 2021-07-22 DIAGNOSIS — R5383 Other fatigue: Secondary | ICD-10-CM

## 2021-07-22 DIAGNOSIS — M25571 Pain in right ankle and joints of right foot: Secondary | ICD-10-CM | POA: Diagnosis not present

## 2021-07-23 ENCOUNTER — Encounter (INDEPENDENT_AMBULATORY_CARE_PROVIDER_SITE_OTHER): Payer: Self-pay | Admitting: Bariatrics

## 2021-07-23 DIAGNOSIS — E7800 Pure hypercholesterolemia, unspecified: Secondary | ICD-10-CM | POA: Insufficient documentation

## 2021-07-23 LAB — COMPREHENSIVE METABOLIC PANEL
ALT: 14 IU/L (ref 0–32)
AST: 14 IU/L (ref 0–40)
Albumin/Globulin Ratio: 1.7 (ref 1.2–2.2)
Albumin: 4.1 g/dL (ref 3.8–4.8)
Alkaline Phosphatase: 68 IU/L (ref 44–121)
BUN/Creatinine Ratio: 25 — ABNORMAL HIGH (ref 9–23)
BUN: 16 mg/dL (ref 6–24)
Bilirubin Total: 0.6 mg/dL (ref 0.0–1.2)
CO2: 22 mmol/L (ref 20–29)
Calcium: 9.1 mg/dL (ref 8.7–10.2)
Chloride: 104 mmol/L (ref 96–106)
Creatinine, Ser: 0.63 mg/dL (ref 0.57–1.00)
Globulin, Total: 2.4 g/dL (ref 1.5–4.5)
Glucose: 82 mg/dL (ref 70–99)
Potassium: 4.8 mmol/L (ref 3.5–5.2)
Sodium: 139 mmol/L (ref 134–144)
Total Protein: 6.5 g/dL (ref 6.0–8.5)
eGFR: 109 mL/min/{1.73_m2} (ref >59)

## 2021-07-23 LAB — LIPID PANEL WITH LDL/HDL RATIO
Cholesterol, Total: 248 mg/dL — ABNORMAL HIGH (ref 100–199)
HDL: 57 mg/dL (ref >39)
LDL Chol Calc (NIH): 173 mg/dL — ABNORMAL HIGH (ref 0–99)
LDL/HDL Ratio: 3 ratio (ref 0.0–3.2)
Triglycerides: 104 mg/dL (ref 0–149)
VLDL Cholesterol Cal: 18 mg/dL (ref 5–40)

## 2021-07-23 LAB — INSULIN, RANDOM: INSULIN: 15.8 u[IU]/mL (ref 2.6–24.9)

## 2021-07-23 LAB — TSH+T4F+T3FREE
Free T4: 1.18 ng/dL (ref 0.82–1.77)
T3, Free: 2.8 pg/mL (ref 2.0–4.4)
TSH: 3.35 u[IU]/mL (ref 0.450–4.500)

## 2021-07-23 LAB — ANEMIA PANEL
Ferritin: 16 ng/mL (ref 15–150)
Folate, Hemolysate: 297 ng/mL
Folate, RBC: 786 ng/mL (ref >498)
Hematocrit: 37.8 % (ref 34.0–46.6)
Iron Saturation: 16 % (ref 15–55)
Iron: 58 ug/dL (ref 27–159)
Retic Ct Pct: 2.3 % (ref 0.6–2.6)
Total Iron Binding Capacity: 374 ug/dL (ref 250–450)
UIBC: 316 ug/dL (ref 131–425)
Vitamin B-12: 174 pg/mL — ABNORMAL LOW (ref 232–1245)

## 2021-07-23 LAB — VITAMIN D 25 HYDROXY (VIT D DEFICIENCY, FRACTURES): Vit D, 25-Hydroxy: 13 ng/mL — ABNORMAL LOW (ref 30.0–100.0)

## 2021-07-23 LAB — HEMOGLOBIN A1C
Est. average glucose Bld gHb Est-mCnc: 114 mg/dL
Hgb A1c MFr Bld: 5.6 % (ref 4.8–5.6)

## 2021-07-23 NOTE — Progress Notes (Signed)
Chief Complaint:   OBESITY [name] (MR# [id number]) is a 51 y.o. female who presents for evaluation and treatment of obesity and related comorbidities. Current BMI is Body mass index is 40.35 kg/m. [name] has been struggling with her weight for many years and has been unsuccessful in either losing weight, maintaining weight loss, or reaching her healthy weight goal.  [name] likes to cook somewhat, but not all of the time.  She craves fish, hot dogs, and fried chicken. She considers herself a picky eater.  She sometimes skips breakfast.  [name] is currently in the action stage of change and ready to dedicate time achieving and maintaining a healthier weight. [name] is interested in becoming our patient and working on intensive lifestyle modifications including (but not limited to) diet and exercise for weight loss.  [name]'s habits were reviewed today and are as follows: she thinks her family will eat healthier with her, her desired weight loss is 50-60 lbs, she started gaining weight after her pregnancies, her heaviest weight ever was 252 pounds, she is a picky eater and doesn't like to eat healthier foods, she has significant food cravings issues, she snacks frequently in the evenings, she skips meals frequently, she is frequently drinking liquids with calories, she frequently makes poor food choices, and she struggles with emotional eating.  Depression Screen [name]'s Food and Mood (modified PHQ-9) score was 9.     07/22/2021    9:11 AM  Depression screen PHQ 2/9  Decreased Interest 1  Down, Depressed, Hopeless 1  PHQ - 2 Score 2  Altered sleeping 1  Tired, decreased energy 1  Change in appetite 1  Feeling bad or failure about yourself  1  Trouble concentrating 2  Moving slowly or fidgety/restless 1  Suicidal thoughts 0  PHQ-9 Score 9  Difficult doing work/chores Somewhat difficult   Subjective:   1. Other fatigue [name] admits to daytime somnolence and denies  waking up still tired. Patient has a history of symptoms of daytime fatigue and morning headache. [name] generally gets 6 + hours of sleep per night, and states that she has generally restful sleep. Snoring is present. Apneic episodes are not present. Epworth Sleepiness Score is 8.   2. SOB (shortness of breath) on exertion [place] notes increasing shortness of breath with exercising and seems to be worsening over time with weight gain. She notes getting out of breath sooner with activity than she used to. This has not gotten worse recently. [name] denies shortness of breath at rest or orthopnea.  Her RMR is significantly lower than expected.  Actual RMR 1267, expected RMR 1914.  3. Vitamin B12 deficiency [name] is taking B12 inconsistently.  She had gotten injections in the past.  4. Vitamin D deficiency [name] is currently taking vitamin D.  5. Abnormal blood sugar [name] is not on medications currently.  6. Hashimoto's thyroiditis [name] is taking levothyroxine.  7. Other iron deficiency anemia [name] is taking ferrous sulfate.   8. Health care maintenance [name] is due for labs and EKG.  Assessment/Plan:   1. Other fatigue [name] does feel that her weight is causing her energy to be lower than it should be. Fatigue may be related to obesity, depression or many other causes. Labs will be ordered, and in the meanwhile, [name] will focus on self care including making healthy food choices, increasing physical activity and focusing on stress reduction.  - EKG 12-Lead - TSH+T4F+T3Free - Comprehensive metabolic panel  2. SOB (shortness  of breath) on exertion [name] does feel that she gets out of breath more easily that she used to when she exercises. [name]'s shortness of breath appears to be obesity related and exercise induced. She has agreed to work on weight loss and gradually increase exercise to treat her exercise induced shortness of breath. Will continue to monitor closely.  -  TSH+T4F+T3Free  3. Vitamin B12 deficiency We will check labs today. [name] will continue her B12 supplementation. We will follow-up at her next visit.   - Vitamin B12  4. Vitamin D deficiency We will check labs today. Unknown will continue her Vitamin D supplementation. We will follow-up at her next visit.   - VITAMIN D 25 Hydroxy (Vit-D Deficiency, Fractures)  5. Abnormal blood sugar We will check labs today. [name] will keep all starches and sugars low. We will follow-up at her next visit.  - Insulin, random - Hemoglobin A1c - Comprehensive metabolic panel  6. Hashimoto's thyroiditis [name] will continue her medications as directed.   7. Other iron deficiency anemia We will check labs today, and we will follow-up at [facility] next visit.  - Anemia panel - Ferritin  8. Health care maintenance We will check labs today, and EKG was done.   - Lipid Panel With LDL/HDL Ratio  9. Depression screening [name] had a positive depression screening. Depression is commonly associated with obesity and often results in emotional eating behaviors. We will monitor this closely and work on CBT to help improve the non-hunger eating patterns. Referral to Psychology may be required if no improvement is seen as she continues in our clinic.  10. Class 3 severe obesity with serious comorbidity and body mass index (BMI) of 40.0 to 44.9 in adult, unspecified obesity type [name]) [name] is currently in the action stage of change and her goal is to continue with weight loss efforts. I recommend Lyrical begin the structured treatment plan as follows:  She has agreed to the Category 1 Plan.  Meal planning and intentional eating were discussed.  Eating out guide was given.  Exercise goals: No exercise has been prescribed at this time.   Behavioral modification strategies: increasing lean protein intake, decreasing simple carbohydrates, increasing vegetables, increasing water intake, decreasing eating out,  no skipping meals, meal planning and cooking strategies, keeping healthy foods in the home, and planning for success.  She was informed of the importance of frequent follow-up visits to maximize her success with intensive lifestyle modifications for her multiple health conditions. She was informed we would discuss her lab results at her next visit unless there is a critical issue that needs to be addressed sooner. Avarae agreed to keep her next visit at the agreed upon time to discuss these results.  Objective:   Blood pressure (!) 144/73, pulse 66, temperature 97.8 F (36.6 C), height '5\' 6"'$  (1.676 m), weight 250 lb (113.4 kg), last menstrual period 03/31/2013, SpO2 98 %. Body mass index is 40.35 kg/m.  EKG: Normal sinus rhythm, rate 62 BPM.  Indirect Calorimeter completed today shows a VO2 of 184 and a REE of 1267.  Her calculated basal metabolic rate is 0093 thus her basal metabolic rate is worse than expected.  General: Cooperative, alert, well developed, in no acute distress. HEENT: Conjunctivae and lids unremarkable. Cardiovascular: Regular rhythm.  Lungs: Normal work of breathing. Neurologic: No focal deficits.   Lab Results  Component Value Date   CREATININE 0.51 07/25/2020   BUN 14 07/25/2020   NA 138 07/25/2020   K 3.6  07/25/2020   CL 106 07/25/2020   CO2 23 07/25/2020   Lab Results  Component Value Date   ALT 12 04/18/2019   AST 16 04/18/2019   ALKPHOS 60 04/18/2019   BILITOT 0.9 04/18/2019   Lab Results  Component Value Date   HGBA1C 5.6 02/04/2021   HGBA1C 5.3 11/07/2018   No results found for: "INSULIN" Lab Results  Component Value Date   TSH 1.730 02/04/2021   No results found for: "CHOL", "HDL", "LDLCALC", "LDLDIRECT", "TRIG", "CHOLHDL" Lab Results  Component Value Date   WBC 5.3 10/23/2020   HGB 12.8 10/23/2020   HCT 39.4 10/23/2020   MCV 80 10/23/2020   PLT 317 10/23/2020   Lab Results  Component Value Date   IRON 68 10/23/2020   TIBC 376  11/07/2018   FERRITIN 27 10/23/2020   Attestation Statements:   Reviewed by clinician on day of visit: allergies, medications, problem list, medical history, surgical history, family history, social history, and previous encounter notes.   Wilhemena Durie, am acting as Location manager for [employer], DO.  I have reviewed the above documentation for accuracy and completeness, and I agree with the above. Jearld Lesch, DO

## 2021-07-23 NOTE — Progress Notes (Signed)
Notified patient of Dr. Brown's recommendations. Patient verbalized understanding. 

## 2021-07-30 DIAGNOSIS — M722 Plantar fascial fibromatosis: Secondary | ICD-10-CM | POA: Diagnosis not present

## 2021-08-04 ENCOUNTER — Ambulatory Visit (INDEPENDENT_AMBULATORY_CARE_PROVIDER_SITE_OTHER): Payer: Medicaid Other | Admitting: Bariatrics

## 2021-08-04 ENCOUNTER — Encounter (INDEPENDENT_AMBULATORY_CARE_PROVIDER_SITE_OTHER): Payer: Self-pay | Admitting: Bariatrics

## 2021-08-04 VITALS — BP 118/79 | HR 61 | Temp 97.5°F | Ht 66.0 in | Wt 246.0 lb

## 2021-08-04 DIAGNOSIS — E559 Vitamin D deficiency, unspecified: Secondary | ICD-10-CM

## 2021-08-04 DIAGNOSIS — Z6839 Body mass index (BMI) 39.0-39.9, adult: Secondary | ICD-10-CM | POA: Diagnosis not present

## 2021-08-04 DIAGNOSIS — E8881 Metabolic syndrome: Secondary | ICD-10-CM | POA: Diagnosis not present

## 2021-08-04 DIAGNOSIS — E7800 Pure hypercholesterolemia, unspecified: Secondary | ICD-10-CM

## 2021-08-04 DIAGNOSIS — E669 Obesity, unspecified: Secondary | ICD-10-CM | POA: Diagnosis not present

## 2021-08-04 DIAGNOSIS — E538 Deficiency of other specified B group vitamins: Secondary | ICD-10-CM | POA: Diagnosis not present

## 2021-08-04 MED ORDER — VITAMIN D (ERGOCALCIFEROL) 1.25 MG (50000 UNIT) PO CAPS: 50000.0000 [IU] | ORAL_CAPSULE | ORAL | 0 refills | Status: DC

## 2021-08-10 NOTE — Progress Notes (Unsigned)
Chief Complaint:   OBESITY [name] is here to discuss her progress with her obesity treatment plan along with follow-up of her obesity related diagnoses. [name] is on the Category 1 Plan and states she is following her eating plan approximately 80% of the time. [name] states she is doing 0 minutes 0 times per week.  Today's visit was #: 2 Starting weight: 250 lbs Starting date: 07/22/2021 Today's weight: 246 lbs Today's date: 08/04/2021 Total lbs lost to date: 4 Total lbs lost since last in-office visit: 4  Interim History: [name] is down 4 pounds since her first visit.  She has changed her eating habits.  Subjective:   1. Vitamin B12 deficiency [name] is currently taking B12 OTC, and her B12 level is 174.  2. Vitamin D deficiency [name] is on high-dose vitamin D, and her vitamin D level is 13.0.  3. Insulin resistance [name]'s insulin level is 15.8, and A1c 5.6.  She is not on medications currently.  4. Elevated cholesterol [name]'s total cholesterol is 248, and LDL 173.  Assessment/Plan:   1. Vitamin B12 deficiency Mailin will continue her vitamin B12 OTC.  2. Vitamin D deficiency Darris will continue prescription vitamin D 50,000 units once weekly, and we will refill for 1 month.  - Vitamin D, Ergocalciferol, (DRISDOL) 1.25 MG (50000 UNIT) CAPS capsule; Take 1 capsule (50,000 Units total) by mouth every 7 (seven) days.  Dispense: 5 capsule; Refill: 0  3. Insulin resistance Handouts on insulin resistance and prediabetes were given to the patient today.  4. Elevated cholesterol [name] will eliminate trans fats, and will limit saturated fats. MUFA's and PUFA's.  5. Obesity, Current BMI 39.7 [name] is currently in the action stage of change. As such, her goal is to continue with weight loss efforts. She has agreed to the Category 1 Plan.   Meal planning was discussed.  Reviewed labs with the patient from 07/22/2021, CMP, lipids, vitamin D, B12, iron/anemia panel, HCT,  A1c, insulin, and thyroid panel.  Protein equivalents were discussed.  She will keep her water intake high.  Exercise goals: No exercise has been prescribed at this time.  Behavioral modification strategies: increasing lean protein intake, decreasing simple carbohydrates, increasing vegetables, increasing water intake, decreasing eating out, no skipping meals, meal planning and cooking strategies, keeping healthy foods in the home, and planning for success.  [name] has agreed to follow-up with our clinic in 2 to 3 weeks. She was informed of the importance of frequent follow-up visits to maximize her success with intensive lifestyle modifications for her multiple health conditions.   Objective:   Blood pressure 118/79, pulse 61, temperature (!) 97.5 F (36.4 C), height '5\' 6"'$  (1.676 m), weight 246 lb (111.6 kg), last menstrual period 03/31/2013, SpO2 99 %. Body mass index is 39.71 kg/m.  She has known ankle boot (right foot) bone bruise and tear in her plantar fascitis. General: Cooperative, alert, well developed, in no acute distress. HEENT: Conjunctivae and lids unremarkable. Cardiovascular: Regular rhythm.  Lungs: Normal work of breathing. Neurologic: No focal deficits.   Lab Results  Component Value Date   CREATININE 0.63 07/22/2021   BUN 16 07/22/2021   NA 139 07/22/2021   K 4.8 07/22/2021   CL 104 07/22/2021   CO2 22 07/22/2021   Lab Results  Component Value Date   ALT 14 07/22/2021   AST 14 07/22/2021   ALKPHOS 68 07/22/2021   BILITOT 0.6 07/22/2021   Lab Results  Component Value Date   HGBA1C  5.6 07/22/2021   HGBA1C 5.6 02/04/2021   HGBA1C 5.3 11/07/2018   Lab Results  Component Value Date   INSULIN 15.8 07/22/2021   Lab Results  Component Value Date   TSH 3.350 07/22/2021   Lab Results  Component Value Date   CHOL 248 (H) 07/22/2021   HDL 57 07/22/2021   LDLCALC 173 (H) 07/22/2021   TRIG 104 07/22/2021   Lab Results  Component Value Date   VD25OH  13.0 (L) 07/22/2021   VD25OH 19.6 (L) 02/04/2021   VD25OH 14.6 (L) 10/23/2020   Lab Results  Component Value Date   WBC 5.3 10/23/2020   HGB 12.8 10/23/2020   HCT 37.8 07/22/2021   MCV 80 10/23/2020   PLT 317 10/23/2020   Lab Results  Component Value Date   IRON 58 07/22/2021   TIBC 374 07/22/2021   FERRITIN 16 07/22/2021   Attestation Statements:   Reviewed by clinician on day of visit: allergies, medications, problem list, medical history, surgical history, family history, social history, and previous encounter notes.   Wilhemena Durie, am acting as Location manager for [employer], DO.  I have reviewed the above documentation for accuracy and completeness, and I agree with the above. Jearld Lesch, DO

## 2021-08-11 ENCOUNTER — Encounter (INDEPENDENT_AMBULATORY_CARE_PROVIDER_SITE_OTHER): Payer: Self-pay | Admitting: Bariatrics

## 2021-08-13 ENCOUNTER — Emergency Department (HOSPITAL_BASED_OUTPATIENT_CLINIC_OR_DEPARTMENT_OTHER): Payer: Medicaid Other | Admitting: Radiology

## 2021-08-13 ENCOUNTER — Emergency Department (HOSPITAL_BASED_OUTPATIENT_CLINIC_OR_DEPARTMENT_OTHER)
Admission: EM | Admit: 2021-08-13 | Discharge: 2021-08-13 | Disposition: A | Discharge status: Home / Self Care | Payer: Medicaid Other | Attending: Emergency Medicine | Admitting: Emergency Medicine

## 2021-08-13 DIAGNOSIS — R110 Nausea: Secondary | ICD-10-CM | POA: Diagnosis not present

## 2021-08-13 DIAGNOSIS — R42 Dizziness and giddiness: Secondary | ICD-10-CM | POA: Diagnosis not present

## 2021-08-13 DIAGNOSIS — R0789 Other chest pain: Secondary | ICD-10-CM | POA: Diagnosis not present

## 2021-08-13 DIAGNOSIS — R519 Headache, unspecified: Secondary | ICD-10-CM | POA: Diagnosis not present

## 2021-08-13 DIAGNOSIS — R0602 Shortness of breath: Secondary | ICD-10-CM | POA: Diagnosis not present

## 2021-08-13 DIAGNOSIS — R072 Precordial pain: Secondary | ICD-10-CM | POA: Diagnosis not present

## 2021-08-13 LAB — BASIC METABOLIC PANEL
Anion gap: 8 (ref 5–15)
BUN: 15 mg/dL (ref 6–20)
CO2: 24 mmol/L (ref 22–32)
Calcium: 9.2 mg/dL (ref 8.9–10.3)
Chloride: 105 mmol/L (ref 98–111)
Creatinine, Ser: 0.64 mg/dL (ref 0.44–1.00)
GFR, Estimated: >60 mL/min (ref >60)
Glucose, Bld: 89 mg/dL (ref 70–99)
Potassium: 3.8 mmol/L (ref 3.5–5.1)
Sodium: 137 mmol/L (ref 135–145)

## 2021-08-13 LAB — CBC
HCT: 36 % (ref 36.0–46.0)
Hemoglobin: 12.2 g/dL (ref 12.0–15.0)
MCH: 27.3 pg (ref 26.0–34.0)
MCHC: 33.9 g/dL (ref 30.0–36.0)
MCV: 80.5 fL (ref 80.0–100.0)
Platelets: 297 10*3/uL (ref 150–400)
RBC: 4.47 MIL/uL (ref 3.87–5.11)
RDW: 12.7 % (ref 11.5–15.5)
WBC: 6.5 10*3/uL (ref 4.0–10.5)
nRBC: 0 % (ref 0.0–0.2)

## 2021-08-13 LAB — TROPONIN I (HIGH SENSITIVITY): Troponin I (High Sensitivity): 3 ng/L (ref <18)

## 2021-08-13 MED ORDER — KETOROLAC TROMETHAMINE 30 MG/ML IJ SOLN
INTRAMUSCULAR | Status: AC
  Filled 2021-08-13: qty 1

## 2021-08-13 NOTE — ED Provider Notes (Signed)
[facility] EMERGENCY DEPT Provider Note   CSN: [id number] Arrival date & time: 08/13/21  0010     History  Chief Complaint  Patient presents with   Chest Pain    [name] is a 51 y.o. female.  Patient is a 51 year old female with past medical history of Hashimoto's thyroiditis, anemia, migraines.  Patient presenting today for evaluation of chest discomfort.  She reports a sensation of pressure in her chest that started approximately 7 PM this evening.  This started after she returned home from work.  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  She also describes being exposed to a warm environment throughout the day.  She denies any nausea, diaphoresis, or radiation to the arm or jaw.  She does feel somewhat short of breath, but denies cough or fever.  Patient has no prior cardiac history.  The history is provided by the patient.       Home Medications Prior to Admission medications   Medication Sig Start Date End Date Taking? Authorizing Provider  acetaminophen (TYLENOL) 500 MG tablet Take 2 tablets (1,000 mg total) by mouth every 6 (six) hours as needed for moderate pain or headache. 02/25/20   Cato Mulligan, MD  cyanocobalamin (,VITAMIN B-12,) 1000 MCG/ML injection  01/11/20   [provider]  ferrous sulfate 325 (65 FE) MG tablet Take 1 tablet (325 mg total) by mouth daily with breakfast. 05/13/20   Lacinda Axon, MD  fluticasone (FLONASE) 50 MCG/ACT nasal spray Place 1 spray into both nostrils daily. 1 spray in each nostril every day Patient not taking: Reported on 07/22/2021 06/03/20   Daisy Floro, DO  levothyroxine (SYNTHROID) 50 MCG tablet     [provider]  meloxicam (MOBIC) 7.5 MG tablet Take 1 tablet (7.5 mg total) by mouth daily. Patient not taking: Reported on 06/22/2021 02/22/20   Zigmund Gottron, NP  promethazine-dextromethorphan (PROMETHAZINE-DM) 6.25-15 MG/5ML syrup Take 5 mLs by mouth 3 (three) times  daily as needed for cough. Patient not taking: Reported on 06/22/2021 01/30/21   Raspet, Derry Skill, PA-C  pseudoephedrine-acetaminophen (TYLENOL SINUS) 30-500 MG TABS tablet Take 1 tablet by mouth every 4 (four) hours as needed. Patient not taking: Reported on 06/22/2021    [provider]  tiZANidine (ZANAFLEX) 2 MG tablet Take 1-2 tablets (2-4 mg total) by mouth at bedtime. Patient not taking: Reported on 06/22/2021 02/22/20   Zigmund Gottron, NP  Vitamin D, Ergocalciferol, (DRISDOL) 1.25 MG (50000 UNIT) CAPS capsule Take 1 capsule (50,000 Units total) by mouth every 7 (seven) days. 08/04/21   Jearld Lesch A, DO  azelastine (ASTELIN) 0.1 % nasal spray Place 1 spray into both nostrils 2 (two) times daily. Use in each nostril as directed Patient not taking: Reported on 03/05/2019 12/01/17 12/24/19  Shella Maxim, NP  DULoxetine (CYMBALTA) 30 MG capsule Take 1 capsule (30 mg total) by mouth daily. 12/19/19 12/24/19  Seawell, Jaimie A, DO  gabapentin (NEURONTIN) 300 MG capsule TAKE 1 CAPSULE(300 MG) BY MOUTH TWICE DAILY Patient not taking: Reported on 08/14/2019 05/23/19 12/24/19  Maudie Mercury, MD      Allergies    Bee venom, Bactrim [sulfamethoxazole-trimethoprim], Codeine, and Sulfamethoxazole-trimethoprim    Review of Systems   Review of Systems  All other systems reviewed and are negative.   Physical Exam Updated Vital Signs BP (!) 146/70   Pulse 63   Temp 98.2 F (36.8 C) (Oral)   Resp 18  Ht '5\' 5"'$  (1.651 m)   Wt 110.7 kg   LMP 03/31/2013 (Exact Date)   SpO2 97%   BMI 40.60 kg/m  Physical Exam Vitals and nursing note reviewed.  Constitutional:      General: She is not in acute distress.    Appearance: She is well-developed. She is not diaphoretic.  HENT:     Head: Normocephalic and atraumatic.  Cardiovascular:     Rate and Rhythm: Normal rate and regular rhythm.     Heart sounds: No murmur heard.    No friction rub. No gallop.  Pulmonary:     Effort: Pulmonary effort is  normal. No respiratory distress.     Breath sounds: Normal breath sounds. No wheezing.  Abdominal:     General: Bowel sounds are normal. There is no distension.     Palpations: Abdomen is soft.     Tenderness: There is no abdominal tenderness.  Musculoskeletal:        General: Normal range of motion.     Cervical back: Normal range of motion and neck supple.     Right lower leg: No tenderness. No edema.     Left lower leg: No tenderness. No edema.  Skin:    General: Skin is warm and dry.  Neurological:     General: No focal deficit present.     Mental Status: She is alert and oriented to person, place, and time.     ED Results / Procedures / Treatments   Labs (all labs ordered are listed, but only abnormal results are displayed) Labs Reviewed  CBC  BASIC METABOLIC PANEL  TROPONIN I (HIGH SENSITIVITY)    EKG EKG Interpretation  Date/Time:  Thursday August 13 2021 00:17:16 EDT Ventricular Rate:  59 PR Interval:  150 QRS Duration: 97 QT Interval:  435 QTC Calculation: 431 R Axis:   35 Text Interpretation: Sinus rhythm Normal ECG Confirmed by Veryl Speak [phone]) on 08/13/2021 1:31:15 AM  Radiology No results found.  Procedures Procedures    Medications Ordered in ED Medications  ketorolac (TORADOL) 30 MG/ML injection (has no administration in time range)    ED Course/ Medical Decision Making/ A&P  Patient presenting here with complaints of chest discomfort as described in the HPI.  Her symptoms sound noncardiac in nature and her cardiac work-up is unremarkable.  Troponin is negative despite 6 hours of symptoms and EKG is unchanged.  I have considered, but doubt pulmonary embolism, aortic dissection, pneumothorax, or other significant pathology.  Patient's vital signs are stable and oxygen saturations are 100%.  I feel as though she can safely be discharged.  Cause of her discomfort is unclear, but I suspect a musculoskeletal etiology.  She also reports being in  the heat today and possibly this is a heat related illness.  She did receive Toradol and IV fluids and is feeling significantly improved.  Final Clinical Impression(s) / ED Diagnoses Final diagnoses:  None    Rx / DC Orders ED Discharge Orders     None         Veryl Speak, MD 08/13/21 [id number]

## 2021-08-13 NOTE — Discharge Instructions (Signed)
Drink plenty of fluids and get plenty of rest.  Ibuprofen 600 mg every 6 hours as needed for pain.  Follow-up with primary doctor if not improving in the next week, and return to the ER if symptoms significantly worsen or change.

## 2021-08-13 NOTE — ED Triage Notes (Signed)
Pt presents to the ED with midsternal CP that started today around 1900. Pt reports some nausea and dizziness associated with the CP. Also reports a HA.

## 2021-08-25 DIAGNOSIS — M722 Plantar fascial fibromatosis: Secondary | ICD-10-CM | POA: Diagnosis not present

## 2021-08-26 ENCOUNTER — Encounter (INDEPENDENT_AMBULATORY_CARE_PROVIDER_SITE_OTHER): Payer: Self-pay

## 2021-09-07 ENCOUNTER — Encounter (INDEPENDENT_AMBULATORY_CARE_PROVIDER_SITE_OTHER): Payer: Self-pay | Admitting: Bariatrics

## 2021-09-07 ENCOUNTER — Ambulatory Visit (INDEPENDENT_AMBULATORY_CARE_PROVIDER_SITE_OTHER): Payer: Medicaid Other | Admitting: Bariatrics

## 2021-09-07 VITALS — BP 132/83 | HR 64 | Temp 98.0°F | Ht 66.0 in | Wt 238.0 lb

## 2021-09-07 DIAGNOSIS — E559 Vitamin D deficiency, unspecified: Secondary | ICD-10-CM

## 2021-09-07 DIAGNOSIS — Z6838 Body mass index (BMI) 38.0-38.9, adult: Secondary | ICD-10-CM | POA: Diagnosis not present

## 2021-09-07 DIAGNOSIS — E7800 Pure hypercholesterolemia, unspecified: Secondary | ICD-10-CM | POA: Diagnosis not present

## 2021-09-07 DIAGNOSIS — E669 Obesity, unspecified: Secondary | ICD-10-CM

## 2021-09-07 DIAGNOSIS — Z6841 Body Mass Index (BMI) 40.0 and over, adult: Secondary | ICD-10-CM | POA: Insufficient documentation

## 2021-09-07 MED ORDER — VITAMIN D (ERGOCALCIFEROL) 1.25 MG (50000 UNIT) PO CAPS: 50000.0000 [IU] | ORAL_CAPSULE | ORAL | 0 refills | Status: DC

## 2021-09-08 DIAGNOSIS — M79671 Pain in right foot: Secondary | ICD-10-CM | POA: Diagnosis not present

## 2021-09-10 NOTE — Progress Notes (Signed)
Chief Complaint:   OBESITY [name] is here to discuss her progress with her obesity treatment plan along with follow-up of her obesity related diagnoses. [name] is on the Category 1 Plan and states she is following her eating plan approximately 90% of the time. [name] states she is not currently exercising.  Today's visit was #: 3 Starting weight: 250 lbs Starting date: 07/22/2021 Today's weight: 238 lbs Today's date: 09/07/21 Total lbs lost to date: 12 Total lbs lost since last in-office visit: -8  Interim History: She is down 8 pounds since her last visit.  Subjective:   1. Vitamin D deficiency Notes increased fatigue.  Taking vitamin D as directed.  2. Elevated cholesterol No medication.  Assessment/Plan:   1. Vitamin D deficiency Refill - Vitamin D, Ergocalciferol, (DRISDOL) 1.25 MG (50000 UNIT) CAPS capsule; Take 1 capsule (50,000 Units total) by mouth every 7 (seven) days.  Dispense: 5 capsule; Refill: 0  2. Elevated cholesterol 1.  No trans fats. 2.  Will limit saturated fats and decrease processed meat.  3. Obesity, Current BMI 38.5 1.  Meal planning 2.  Intentional eating 3.  Has taken out "bad snacks"  [name] is currently in the action stage of change. As such, her goal is to continue with weight loss efforts. She has agreed to the Category 1 Plan.   Exercise goals: Has not been able to exercise.  Behavioral modification strategies: increasing lean protein intake, decreasing simple carbohydrates, increasing vegetables, increasing water intake, decreasing eating out, no skipping meals, meal planning and cooking strategies, keeping healthy foods in the home, and planning for success.  [name] has agreed to follow-up with our clinic in 2 to 3 weeks at [facility]. She was informed of the importance of frequent follow-up visits to maximize her success with intensive lifestyle modifications for her multiple health conditions.    Objective:   Blood pressure  132/83, pulse 64, temperature 98 F (36.7 C), height '5\' 6"'$  (1.676 m), weight 238 lb (108 kg), last menstrual period 03/31/2013, SpO2 100 %. Body mass index is 38.41 kg/m.  General: Cooperative, alert, well developed, in no acute distress. HEENT: Conjunctivae and lids unremarkable. Cardiovascular: Regular rhythm.  Lungs: Normal work of breathing. Neurologic: No focal deficits.   Lab Results  Component Value Date   CREATININE 0.64 08/13/2021   BUN 15 08/13/2021   NA 137 08/13/2021   K 3.8 08/13/2021   CL 105 08/13/2021   CO2 24 08/13/2021   Lab Results  Component Value Date   ALT 14 07/22/2021   AST 14 07/22/2021   ALKPHOS 68 07/22/2021   BILITOT 0.6 07/22/2021   Lab Results  Component Value Date   HGBA1C 5.6 07/22/2021   HGBA1C 5.6 02/04/2021   HGBA1C 5.3 11/07/2018   Lab Results  Component Value Date   INSULIN 15.8 07/22/2021   Lab Results  Component Value Date   TSH 3.350 07/22/2021   Lab Results  Component Value Date   CHOL 248 (H) 07/22/2021   HDL 57 07/22/2021   LDLCALC 173 (H) 07/22/2021   TRIG 104 07/22/2021   Lab Results  Component Value Date   VD25OH 13.0 (L) 07/22/2021   VD25OH 19.6 (L) 02/04/2021   VD25OH 14.6 (L) 10/23/2020   Lab Results  Component Value Date   WBC 6.5 08/13/2021   HGB 12.2 08/13/2021   HCT 36.0 08/13/2021   MCV 80.5 08/13/2021   PLT 297 08/13/2021   Lab Results  Component Value Date  IRON 58 07/22/2021   TIBC 374 07/22/2021   FERRITIN 16 07/22/2021    Attestation Statements:   Reviewed by clinician on day of visit: allergies, medications, problem list, medical history, surgical history, family history, social history, and previous encounter notes.  I, Dawn Whitmire, FNP-C, am acting as transcriptionist for Dr. Jearld Lesch.  I have reviewed the above documentation for accuracy and completeness, and I agree with the above. Jearld Lesch, DO

## 2021-09-11 ENCOUNTER — Other Ambulatory Visit (INDEPENDENT_AMBULATORY_CARE_PROVIDER_SITE_OTHER): Payer: Self-pay | Admitting: Bariatrics

## 2021-09-11 DIAGNOSIS — E559 Vitamin D deficiency, unspecified: Secondary | ICD-10-CM

## 2021-09-15 ENCOUNTER — Encounter (INDEPENDENT_AMBULATORY_CARE_PROVIDER_SITE_OTHER): Payer: Self-pay | Admitting: Bariatrics

## 2021-09-17 DIAGNOSIS — M79671 Pain in right foot: Secondary | ICD-10-CM | POA: Diagnosis not present

## 2021-09-24 DIAGNOSIS — M79671 Pain in right foot: Secondary | ICD-10-CM | POA: Diagnosis not present

## 2021-10-02 ENCOUNTER — Other Ambulatory Visit: Payer: Self-pay

## 2021-10-02 ENCOUNTER — Encounter (HOSPITAL_BASED_OUTPATIENT_CLINIC_OR_DEPARTMENT_OTHER): Payer: Self-pay

## 2021-10-02 DIAGNOSIS — I471 Supraventricular tachycardia: Secondary | ICD-10-CM | POA: Diagnosis not present

## 2021-10-02 DIAGNOSIS — Z79899 Other long term (current) drug therapy: Secondary | ICD-10-CM | POA: Insufficient documentation

## 2021-10-02 DIAGNOSIS — R002 Palpitations: Secondary | ICD-10-CM | POA: Diagnosis present

## 2021-10-02 NOTE — ED Triage Notes (Signed)
Pt states that for that past 45 minutes she has been having a panic attack and feeling like her heart is racing, pt is SOB, HR 160-170

## 2021-10-02 NOTE — ED Notes (Signed)
RT assessed pt in triage for SOB. Pt HR in 170's at this time. Pt RR 26 BLBS clear throughout all fields, pts respiratory status is stable w/minimal distress noted (pt states she feels like she is having a panic attack), WOB mild at this time. RT will continue to monitor pt while in [facility] ED.

## 2021-10-03 ENCOUNTER — Emergency Department (HOSPITAL_BASED_OUTPATIENT_CLINIC_OR_DEPARTMENT_OTHER): Payer: Medicaid Other | Admitting: Radiology

## 2021-10-03 ENCOUNTER — Other Ambulatory Visit: Payer: Self-pay

## 2021-10-03 ENCOUNTER — Emergency Department (HOSPITAL_BASED_OUTPATIENT_CLINIC_OR_DEPARTMENT_OTHER)
Admission: EM | Admit: 2021-10-03 | Discharge: 2021-10-03 | Disposition: A | Discharge status: Home / Self Care | Payer: Medicaid Other | Attending: Emergency Medicine | Admitting: Emergency Medicine

## 2021-10-03 DIAGNOSIS — R0602 Shortness of breath: Secondary | ICD-10-CM | POA: Diagnosis not present

## 2021-10-03 DIAGNOSIS — I471 Supraventricular tachycardia: Secondary | ICD-10-CM

## 2021-10-03 LAB — BASIC METABOLIC PANEL
Anion gap: 13 (ref 5–15)
BUN: 15 mg/dL (ref 6–20)
CO2: 21 mmol/L — ABNORMAL LOW (ref 22–32)
Calcium: 10 mg/dL (ref 8.9–10.3)
Chloride: 104 mmol/L (ref 98–111)
Creatinine, Ser: 0.75 mg/dL (ref 0.44–1.00)
GFR, Estimated: >60 mL/min (ref >60)
Glucose, Bld: 117 mg/dL — ABNORMAL HIGH (ref 70–99)
Potassium: 4 mmol/L (ref 3.5–5.1)
Sodium: 138 mmol/L (ref 135–145)

## 2021-10-03 LAB — TROPONIN I (HIGH SENSITIVITY): Troponin I (High Sensitivity): 3 ng/L (ref <18)

## 2021-10-03 LAB — CBC
HCT: 39.6 % (ref 36.0–46.0)
Hemoglobin: 13.6 g/dL (ref 12.0–15.0)
MCH: 27.1 pg (ref 26.0–34.0)
MCHC: 34.3 g/dL (ref 30.0–36.0)
MCV: 79 fL — ABNORMAL LOW (ref 80.0–100.0)
Platelets: 367 10*3/uL (ref 150–400)
RBC: 5.01 MIL/uL (ref 3.87–5.11)
RDW: 12.8 % (ref 11.5–15.5)
WBC: 10.8 10*3/uL — ABNORMAL HIGH (ref 4.0–10.5)
nRBC: 0 % (ref 0.0–0.2)

## 2021-10-03 LAB — RAPID URINE DRUG SCREEN, HOSP PERFORMED
Amphetamines: NOT DETECTED
Barbiturates: NOT DETECTED
Benzodiazepines: NOT DETECTED
Cocaine: NOT DETECTED
Opiates: NOT DETECTED
Tetrahydrocannabinol: POSITIVE — AB

## 2021-10-03 LAB — D-DIMER, QUANTITATIVE: D-Dimer, Quant: 0.91 ug/mL-FEU — ABNORMAL HIGH (ref 0.00–0.50)

## 2021-10-03 LAB — TSH: TSH: 12.479 u[IU]/mL — ABNORMAL HIGH (ref 0.350–4.500)

## 2021-10-03 LAB — PREGNANCY, URINE: Preg Test, Ur: NEGATIVE

## 2021-10-03 MED ORDER — ONDANSETRON HCL 4 MG/2ML IJ SOLN
4.0000 mg | Freq: Once | INTRAMUSCULAR | Status: AC
  Administered 2021-10-03: 4 mg via INTRAVENOUS
  Filled 2021-10-03: qty 2

## 2021-10-03 MED ORDER — METOPROLOL TARTRATE 25 MG PO TABS: 12.5000 mg | ORAL_TABLET | Freq: Two times a day (BID) | ORAL | 0 refills | Status: DC

## 2021-10-03 MED ORDER — METOPROLOL TARTRATE 5 MG/5ML IV SOLN
2.5000 mg | Freq: Once | INTRAVENOUS | Status: AC
  Administered 2021-10-03: 2.5 mg via INTRAVENOUS
  Filled 2021-10-03: qty 5

## 2021-10-03 NOTE — ED Notes (Signed)
Pt went to bathroom and had large BM, also having dizziness, nausea.

## 2021-10-03 NOTE — Discharge Instructions (Addendum)
Please follow-up with cardiology soon. You also need to have your thyroid studies rechecked with your primary doctor as you may need more medication You will take a half a tablet of metoprolol twice a day for the next 2 weeks

## 2021-10-03 NOTE — ED Provider Notes (Signed)
[facility] EMERGENCY DEPT Provider Note   CSN: [id number] Arrival date & time: 10/02/21  2344     History  Chief Complaint  Patient presents with   Panic Attack  Level 5 caveat due to acuity of condition  [name] is a 51 y.o. female.  The history is provided by the patient.   Patient presents with panic attack and palpitations. This episode started about 45 minutes prior to arrival.  She feels that her heart is racing and she is anxious.  She feels shortness of breath.  No LOC.  She reports similar episode about 2 weeks ago.  She has never been treated for any heart conditions. History is limited by acuity of condition    Home Medications Prior to Admission medications   Medication Sig Start Date End Date Taking? Authorizing Provider  metoprolol tartrate (LOPRESSOR) 25 MG tablet Take 0.5 tablets (12.5 mg total) by mouth 2 (two) times daily. 10/03/21  Yes Ripley Fraise, MD  acetaminophen (TYLENOL) 500 MG tablet Take 2 tablets (1,000 mg total) by mouth every 6 (six) hours as needed for moderate pain or headache. 02/25/20   Cato Mulligan, MD  cyanocobalamin (,VITAMIN B-12,) 1000 MCG/ML injection  01/11/20   [provider]  ferrous sulfate 325 (65 FE) MG tablet Take 1 tablet (325 mg total) by mouth daily with breakfast. 05/13/20   Lacinda Axon, MD  fluticasone (FLONASE) 50 MCG/ACT nasal spray Place 1 spray into both nostrils daily. 1 spray in each nostril every day 06/03/20   Daisy Floro, DO  levothyroxine (SYNTHROID) 50 MCG tablet     [provider]  meloxicam (MOBIC) 7.5 MG tablet Take 1 tablet (7.5 mg total) by mouth daily. 02/22/20   Zigmund Gottron, NP  promethazine-dextromethorphan (PROMETHAZINE-DM) 6.25-15 MG/5ML syrup Take 5 mLs by mouth 3 (three) times daily as needed for cough. 01/30/21   Raspet, Derry Skill, PA-C  pseudoephedrine-acetaminophen (TYLENOL SINUS) 30-500 MG TABS tablet Take 1 tablet by mouth every 4 (four) hours  as needed.    [provider]  tiZANidine (ZANAFLEX) 2 MG tablet Take 1-2 tablets (2-4 mg total) by mouth at bedtime. 02/22/20   Zigmund Gottron, NP  Vitamin D, Ergocalciferol, (DRISDOL) 1.25 MG (50000 UNIT) CAPS capsule Take 1 capsule (50,000 Units total) by mouth every 7 (seven) days. 09/07/21   Jearld Lesch A, DO  azelastine (ASTELIN) 0.1 % nasal spray Place 1 spray into both nostrils 2 (two) times daily. Use in each nostril as directed Patient not taking: Reported on 03/05/2019 12/01/17 12/24/19  Shella Maxim, NP  DULoxetine (CYMBALTA) 30 MG capsule Take 1 capsule (30 mg total) by mouth daily. 12/19/19 12/24/19  Seawell, Jaimie A, DO  gabapentin (NEURONTIN) 300 MG capsule TAKE 1 CAPSULE(300 MG) BY MOUTH TWICE DAILY Patient not taking: Reported on 08/14/2019 05/23/19 12/24/19  Maudie Mercury, MD      Allergies    Bee venom, Bactrim [sulfamethoxazole-trimethoprim], Codeine, and Sulfamethoxazole-trimethoprim    Review of Systems   Review of Systems  Unable to perform ROS: Acuity of condition    Physical Exam Updated Vital Signs BP (!) 140/90   Pulse 69   Temp 98.1 F (36.7 C)   Resp 18   LMP 03/31/2013 (Exact Date)   SpO2 99%  Physical Exam CONSTITUTIONAL: Well developed/well nourished, anxious HEAD: Normocephalic/atraumatic EYES: EOMI ENMT: Mucous membranes moist CV: S1/S2 noted, tachycardic LUNGS: Lungs are clear to auscultation bilaterally, tachypneic ABDOMEN: soft, nontender, NEURO: Pt is awake/alert/appropriate, moves all extremitiesx4.  No facial droop.   EXTREMITIES: pulses normal/equal, full ROM, no lower extremity edema is noted SKIN: warm, color normal PSYCH: Anxious ED Results / Procedures / Treatments   Labs (all labs ordered are listed, but only abnormal results are displayed) Labs Reviewed  BASIC METABOLIC PANEL - Abnormal; Notable for the following components:      Result Value   CO2 21 (*)    Glucose, Bld 117 (*)    All other components within normal  limits  CBC - Abnormal; Notable for the following components:   WBC 10.8 (*)    MCV 79.0 (*)    All other components within normal limits  TSH - Abnormal; Notable for the following components:   TSH 12.479 (*)    All other components within normal limits  D-DIMER, QUANTITATIVE - Abnormal; Notable for the following components:   D-Dimer, Quant 0.91 (*)    All other components within normal limits  RAPID URINE DRUG SCREEN, HOSP PERFORMED - Abnormal; Notable for the following components:   Tetrahydrocannabinol POSITIVE (*)    All other components within normal limits  PREGNANCY, URINE  TROPONIN I (HIGH SENSITIVITY)    EKG EKG Interpretation  Date/Time:  Friday October 02 2021 23:55:30 EDT Ventricular Rate:  111 PR Interval:  160 QRS Duration: 76 QT Interval:  352 QTC Calculation: 478 R Axis:   45 Text Interpretation: Sinus tachycardia with frequent Premature ventricular complexes Otherwise normal ECG When compared with ECG of 13-Aug-2021 00:17, PREVIOUS ECG IS PRESENT Confirmed by Ripley Fraise ([id number]) on 10/03/2021 12:10:23 AM    Radiology DG Chest Port 1 View  Result Date: 10/03/2021 CLINICAL DATA:  [id number]. Pt states that for that past 45 minutes she has been having a panic attack and feeling like her heart is racing, pt is SOB, HR 160-170 Pt states that for that past 45 minutes she has been having a panic EXAM: PORTABLE CHEST 1 VIEW COMPARISON:  Chest x-ray 08/13/2021, CT chest 02/21/2011 FINDINGS: The heart and mediastinal contours are unchanged. No focal consolidation. No pulmonary edema. No pleural effusion. No pneumothorax. No acute osseous abnormality.  Spine surgical hardware. IMPRESSION: No active disease. Electronically Signed   By: Iven Finn M.D.   On: 10/03/2021 00:43    Procedures .Critical Care  Performed by: Ripley Fraise, MD Authorized by: Ripley Fraise, MD   Critical care provider statement:    Critical care time (minutes):  61   Critical  care start time:  10/03/2021 12:48 AM   Critical care end time:  10/03/2021 1:49 AM   Critical care time was exclusive of:  Separately billable procedures and treating other patients   Critical care was necessary to treat or prevent imminent or life-threatening deterioration of the following conditions:  Cardiac failure and circulatory failure   Critical care was time spent personally by me on the following activities:  Examination of patient, evaluation of patient's response to treatment, development of treatment plan with patient or surrogate, re-evaluation of patient's condition, pulse oximetry, ordering and review of radiographic studies, ordering and review of laboratory studies, ordering and performing treatments and interventions and obtaining history from patient or surrogate   I assumed direction of critical care for this patient from another provider in my specialty: no       Medications Ordered in ED Medications  metoprolol tartrate (LOPRESSOR) injection 2.5 mg (2.5 mg Intravenous Given 10/03/21 0041)  ondansetron (ZOFRAN) injection 4 mg (4 mg Intravenous Given 10/03/21 0041)    ED Course/ Medical Decision  Making/ A&P Clinical Course as of 10/03/21 0413  Sat Oct 03, 2021  0051 WBC(!): 10.8 Mild leukocytosis [DW]  0051 I was called to the room as patient was having tachycardia.  Initial EKG showed heart rate in the low 100s, repeat EKG showed heart rate in the 160s with likely SVT.  It is very intermittent and terminated spontaneously.  She had an additional episode while I was in the room.  We will give a dose of metoprolol and reassess.  Patient denies any known cardiac disease no previous history of PE [DW]  0100 D-dimer elevated but less than 1, by years algorithm pulmonary embolism has been ruled out [DW]  0147 Patient reports she is feeling improved.  She denies any recent pleuritic or sharp chest pain.  No tearing chest pain.  No history of DVT, no lower extremity edema or calf  tenderness. [DW]  0147 Patient reports multiple episodes recently that are increasing in frequency.  She always thought she had panic attacks. [DW]  1761 Patient continues to feel improved.  She ambulated to the bathroom without any symptoms.  She was informed of her elevated TSH and need for outpatient management and recheck [DW]  [phone] Patient feels back to baseline.  After metoprolol, no further tachycardic episodes.  At this point she is safe for discharge home.  No other signs of arrhythmias.  She will follow-up with cardiology as an outpatient.  Low suspicion for ACS/PE at this time [DW]    Clinical Course User Index [DW] Ripley Fraise, MD                           Medical Decision Making Amount and/or Complexity of Data Reviewed Labs: ordered. Decision-making details documented in ED Course. Radiology: ordered.  Risk Prescription drug management.   This patient presents to the ED for concern of palpitations, this involves an extensive number of treatment options, and is a complaint that carries with it a high risk of complications and morbidity.  The differential diagnosis includes but is not limited to SVT, atrial fibrillation, atrial flutter, ventricular tachycardia, panic attack  Comorbidities that complicate the patient evaluation: Patient's presentation is complicated by their history of thyroid disease  Additional history obtained: Records reviewed Primary Care Documents  Lab Tests: I Ordered, and personally interpreted labs.  The pertinent results include:  elevated TSH  Imaging Studies ordered: I ordered imaging studies including X-ray chest   I independently visualized and interpreted imaging which showed no acute findings I agree with the radiologist interpretation  Cardiac Monitoring: The patient was maintained on a cardiac monitor.  I personally viewed and interpreted the cardiac monitor which showed an underlying rhythm of:  sinus tachycardia  Medicines  ordered and prescription drug management: I ordered medication including metoprolol for tachycardia Reevaluation of the patient after these medicines showed that the patient    improved   Critical Interventions:  lopressor IV     Reevaluation: After the interventions noted above, I reevaluated the patient and found that they have :improved  Complexity of problems addressed: Patient's presentation is most consistent with  acute presentation with potential threat to life or bodily function  Disposition: After consideration of the diagnostic results and the patient's response to treatment,  I feel that the patent would benefit from discharge   .           Final Clinical Impression(s) / ED Diagnoses Final diagnoses:  SVT (supraventricular tachycardia) ([place])  Rx / DC Orders ED Discharge Orders          Ordered    Ambulatory referral to Cardiology       Comments: If you have not heard from the Cardiology office within the next 72 hours please call [phone].   10/03/21 0148    metoprolol tartrate (LOPRESSOR) 25 MG tablet  2 times daily        10/03/21 0246              Ripley Fraise, MD 10/03/21 [phone]

## 2021-10-05 ENCOUNTER — Telehealth: Payer: Self-pay

## 2021-10-05 NOTE — Telephone Encounter (Signed)
RTC to patient .  States has spoken to someone already from the Clinics and was given an appointment for 10/20/2021.  Awaiting call from Cardiology.

## 2021-10-05 NOTE — Telephone Encounter (Signed)
Pt seen at the ED on 10/02/2021. PT states she was instructed to contact her PCP's office in reference to her Thyroid medication.  Pt states she is still feeling weak today and is concerned.  Please call the patient back.

## 2021-10-06 ENCOUNTER — Ambulatory Visit: Payer: Self-pay | Admitting: Bariatrics

## 2021-10-06 ENCOUNTER — Telehealth: Payer: Self-pay

## 2021-10-06 NOTE — Telephone Encounter (Signed)
Transition Care Management Unsuccessful Follow-up Telephone Call  Date of discharge and from where:  10/03/2021 from [facility]  Attempts:  1st Attempt  Reason for unsuccessful TCM follow-up call:  Left voice message

## 2021-10-08 DIAGNOSIS — M79671 Pain in right foot: Secondary | ICD-10-CM | POA: Diagnosis not present

## 2021-10-08 NOTE — Telephone Encounter (Signed)
Transition Care Management Unsuccessful Follow-up Telephone Call  Date of discharge and from where:  10/03/2021 from [facility]  Attempts:  2nd Attempt  Reason for unsuccessful TCM follow-up call:  Left voice message

## 2021-10-09 NOTE — Telephone Encounter (Signed)
Transition Care Management Unsuccessful Follow-up Telephone Call  Date of discharge and from where:  10/03/2021 from [facility]  Attempts:  3rd Attempt  Reason for unsuccessful TCM follow-up call:  Unable to reach patient

## 2021-10-12 ENCOUNTER — Ambulatory Visit: Payer: Medicaid Other | Admitting: Bariatrics

## 2021-10-12 ENCOUNTER — Encounter: Payer: Self-pay | Admitting: Bariatrics

## 2021-10-12 VITALS — BP 136/80 | HR 63 | Temp 98.5°F | Ht 66.0 in | Wt 232.0 lb

## 2021-10-12 DIAGNOSIS — E7800 Pure hypercholesterolemia, unspecified: Secondary | ICD-10-CM

## 2021-10-12 DIAGNOSIS — E559 Vitamin D deficiency, unspecified: Secondary | ICD-10-CM

## 2021-10-12 DIAGNOSIS — Z6837 Body mass index (BMI) 37.0-37.9, adult: Secondary | ICD-10-CM | POA: Diagnosis not present

## 2021-10-12 DIAGNOSIS — E669 Obesity, unspecified: Secondary | ICD-10-CM | POA: Diagnosis not present

## 2021-10-12 DIAGNOSIS — Z6841 Body Mass Index (BMI) 40.0 and over, adult: Secondary | ICD-10-CM

## 2021-10-12 MED ORDER — VITAMIN D (ERGOCALCIFEROL) 1.25 MG (50000 UNIT) PO CAPS: 50000.0000 [IU] | ORAL_CAPSULE | ORAL | 0 refills | Status: DC

## 2021-10-13 DIAGNOSIS — M79671 Pain in right foot: Secondary | ICD-10-CM | POA: Diagnosis not present

## 2021-10-14 ENCOUNTER — Encounter: Payer: Self-pay | Admitting: Bariatrics

## 2021-10-14 NOTE — Progress Notes (Signed)
Chief Complaint:   OBESITY [name] is here to discuss her progress with her obesity treatment plan along with follow-up of her obesity related diagnoses. [name] is on the Category 1 Plan and states she is following her eating plan approximately 95% of the time. [name] states she has not been exercising.  Today's visit was #: 4 Starting weight: 250 lbs Starting date: 07/22/2021 Today's weight: 232 lbs Today's date: 10/12/21 Total lbs lost to date: 18 Total lbs lost since last in-office visit: -6  Interim History: She is down another 6 pounds since her last visit.  She is eating more protein.  Subjective:   1. Vitamin D deficiency Taking vitamin D.  2. Elevated cholesterol No medications.  Assessment/Plan:   1. Vitamin D deficiency Refill: - Vitamin D, Ergocalciferol, (DRISDOL) 1.25 MG (50000 UNIT) CAPS capsule; Take 1 capsule (50,000 Units total) by mouth every 7 (seven) days.  Dispense: 5 capsule; Refill: 0  2. Elevated cholesterol 1.  No trans fats 2.  Decrease saturated fat except unprocessed meat , diary , and dark chocolate.   3. Obesity, Current BMI 37.5 1.  Meal planning 2.  Intentional eating 3.  Increase water intake  [name] is currently in the action stage of change. As such, her goal is to continue with weight loss efforts. She has agreed to the Category 1 Plan.   Exercise goals: She is going to be walking more steadily next week.  Behavioral modification strategies: increasing lean protein intake, decreasing simple carbohydrates, increasing vegetables, increasing water intake, decreasing eating out, no skipping meals, meal planning and cooking strategies, keeping healthy foods in the home, and planning for success.  [name] has agreed to follow-up with our clinic in 2-3 weeks. She was informed of the importance of frequent follow-up visits to maximize her success with intensive lifestyle modifications for her multiple health conditions.    Objective:    Blood pressure 136/80, pulse 63, temperature 98.5 F (36.9 C), height '5\' 6"'$  (1.676 m), weight 232 lb (105.2 kg), last menstrual period 03/31/2013, SpO2 100 %. Body mass index is 37.45 kg/m.  General: Cooperative, alert, well developed, in no acute distress. HEENT: Conjunctivae and lids unremarkable. Cardiovascular: Regular rhythm.  Lungs: Normal work of breathing. Neurologic: No focal deficits.   Lab Results  Component Value Date   CREATININE 0.75 10/03/2021   BUN 15 10/03/2021   NA 138 10/03/2021   K 4.0 10/03/2021   CL 104 10/03/2021   CO2 21 (L) 10/03/2021   Lab Results  Component Value Date   ALT 14 07/22/2021   AST 14 07/22/2021   ALKPHOS 68 07/22/2021   BILITOT 0.6 07/22/2021   Lab Results  Component Value Date   HGBA1C 5.6 07/22/2021   HGBA1C 5.6 02/04/2021   HGBA1C 5.3 11/07/2018   Lab Results  Component Value Date   INSULIN 15.8 07/22/2021   Lab Results  Component Value Date   TSH 12.479 (H) 10/03/2021   Lab Results  Component Value Date   CHOL 248 (H) 07/22/2021   HDL 57 07/22/2021   LDLCALC 173 (H) 07/22/2021   TRIG 104 07/22/2021   Lab Results  Component Value Date   VD25OH 13.0 (L) 07/22/2021   VD25OH 19.6 (L) 02/04/2021   VD25OH 14.6 (L) 10/23/2020   Lab Results  Component Value Date   WBC 10.8 (H) 10/03/2021   HGB 13.6 10/03/2021   HCT 39.6 10/03/2021   MCV 79.0 (L) 10/03/2021   PLT 367 10/03/2021   Lab  Results  Component Value Date   IRON 58 07/22/2021   TIBC 374 07/22/2021   FERRITIN 16 07/22/2021    Attestation Statements:   Reviewed by clinician on day of visit: allergies, medications, problem list, medical history, surgical history, family history, social history, and previous encounter notes.  I, Dawn Whitmire, FNP-C, am acting as transcriptionist for Dr. Jearld Lesch.  I have reviewed the above documentation for accuracy and completeness, and I agree with the above. Jearld Lesch, DO

## 2021-10-16 ENCOUNTER — Telehealth: Payer: Self-pay | Admitting: Cardiovascular Disease

## 2021-10-16 MED ORDER — METOPROLOL TARTRATE 25 MG PO TABS: 12.5000 mg | ORAL_TABLET | Freq: Two times a day (BID) | ORAL | 0 refills | Status: DC

## 2021-10-16 NOTE — Telephone Encounter (Signed)
*  STAT* If patient is at the pharmacy, call can be transferred to refill team.   1. Which medications need to be refilled? (please list name of each medication and dose if known) metoprolol tartrate (LOPRESSOR) 25 MG tablet  2. Which pharmacy/location (including street and city if local pharmacy) is medication to be sent to? [place], [place] - [address] AT [place]  3. Do they need a 30 day or 90 day supply? Would like enough until she is seen on 10/21/2021

## 2021-10-20 ENCOUNTER — Ambulatory Visit (INDEPENDENT_AMBULATORY_CARE_PROVIDER_SITE_OTHER): Payer: Medicaid Other

## 2021-10-20 ENCOUNTER — Other Ambulatory Visit: Payer: Self-pay

## 2021-10-20 VITALS — BP 138/70 | HR 65 | Temp 98.2°F | Ht 66.0 in | Wt 237.3 lb

## 2021-10-20 DIAGNOSIS — E538 Deficiency of other specified B group vitamins: Secondary | ICD-10-CM

## 2021-10-20 DIAGNOSIS — D509 Iron deficiency anemia, unspecified: Secondary | ICD-10-CM | POA: Diagnosis not present

## 2021-10-20 DIAGNOSIS — Z139 Encounter for screening, unspecified: Secondary | ICD-10-CM

## 2021-10-20 DIAGNOSIS — R000 Tachycardia, unspecified: Secondary | ICD-10-CM

## 2021-10-20 DIAGNOSIS — E063 Autoimmune thyroiditis: Secondary | ICD-10-CM | POA: Diagnosis not present

## 2021-10-20 DIAGNOSIS — Z0000 Encounter for general adult medical examination without abnormal findings: Secondary | ICD-10-CM

## 2021-10-20 MED ORDER — LEVOTHYROXINE SODIUM 75 MCG PO TABS: 75.0000 ug | ORAL_TABLET | Freq: Every day | ORAL | 2 refills | Status: DC

## 2021-10-20 NOTE — Patient Instructions (Addendum)
Thank you for coming to see [place] in clinic [name].   Plan: - We will have you follow up with cardiology tomorrow to reassess your palpitations - We will increase your levothyroxine dose to 75 mcg - We will order iron labs today to see if you still need to be taking ferrous sulfate - We are ordering several labs to find a cause of your low vitamin B12 - We will refer your for a colonoscopy  It was very nice to meet you.

## 2021-10-20 NOTE — Progress Notes (Signed)
CC: ED f/u visit  HPI:  [name] is a 51 y.o. female with past medical history of Hashimoto's thyroiditis, migraine, anxiety, and obesity that presents for an ED f/u visit.  Patient presented to the ED on 9/16 with worsening anxiety, palpitations, shortness of breath, warmth, and nausea.  It was noted that the patient experienced a similar episode 2 weeks prior.  She denies having dizziness or lightheadedness at that time.  In the ED she was noted to have heart rate in the 160s and SVT on EKG.  Troponin was negative.  Patient was given IV metoprolol and tachycardia resolved.  She was prescribed metoprolol 12.5 mg BID. She states that she has been taking this medication and denies having recurrence of these symptoms since her ED visit.  Patient is scheduled to see cardiology tomorrow.  She denies history of heart conditions, heart failure, abnormal heart rhythms, or having an echo done previously.  Patient has history of Hashimoto's thyroiditis. TSH was elevated at most recent ED visit. She is currently prescribed levothyroxine 50 mcg and states that she takes this medication regularly.  She states that she takes her levothyroxine on an empty stomach, not with citrus, and is not on a PPI currently.  Patient notes a couple pounds of weight gain over the last few months but also states that she has been eating more than usual.  Patient also endorses occasional fatigue.  Patient has a history of vitamin B12 deficiency. She has experienced numbness and tingling in her hands for several years and attributes this to her vitamin B12 deficiency.  She continues to have this numbness and tingling sensation.  Patient has received vitamin B12 injections previously but is currently only taking over-the-counter vitamin B12.  Patient has a history of iron deficiency anemia.  She was prescribed ferrous sulfate 325 mg every other day previously but has not taken this medication in several months.  She  endorses occasional fatigue.  Patient is requesting a referral for colonoscopy today.  Allergies as of 10/20/2021       Reactions   Bee Venom Anaphylaxis   Bactrim [sulfamethoxazole-trimethoprim] Itching   Codeine Nausea And Vomiting   Sulfamethoxazole-trimethoprim Itching        Medication List        Accurate as of October 20, 2021  7:01 AM. If you have any questions, ask your nurse or doctor.          acetaminophen 500 MG tablet Commonly known as: TYLENOL Take 2 tablets (1,000 mg total) by mouth every 6 (six) hours as needed for moderate pain or headache.   cyanocobalamin 1000 MCG/ML injection Commonly known as: VITAMIN B12   ferrous sulfate 325 (65 FE) MG tablet Take 1 tablet (325 mg total) by mouth daily with breakfast.   fluticasone 50 MCG/ACT nasal spray Commonly known as: FLONASE Place 1 spray into both nostrils daily. 1 spray in each nostril every day   levothyroxine 50 MCG tablet Commonly known as: SYNTHROID   meloxicam 7.5 MG tablet Commonly known as: Mobic Take 1 tablet (7.5 mg total) by mouth daily.   metoprolol tartrate 25 MG tablet Commonly known as: LOPRESSOR Take 0.5 tablets (12.5 mg total) by mouth 2 (two) times daily.   promethazine-dextromethorphan 6.25-15 MG/5ML syrup Commonly known as: PROMETHAZINE-DM Take 5 mLs by mouth 3 (three) times daily as needed for cough.   pseudoephedrine-acetaminophen 30-500 MG Tabs tablet Commonly known as: TYLENOL SINUS Take 1 tablet by mouth every 4 (four) hours as  needed.   tiZANidine 2 MG tablet Commonly known as: ZANAFLEX Take 1-2 tablets (2-4 mg total) by mouth at bedtime.   Vitamin D (Ergocalciferol) 1.25 MG (50000 UNIT) Caps capsule Commonly known as: DRISDOL Take 1 capsule (50,000 Units total) by mouth every 7 (seven) days.         Past Medical History:  Diagnosis Date   B12 deficiency    Back pain    Elevated blood pressure reading    Fibroids, submucosal    History of blood  transfusion 02/19/2011   [facility] - ? 2-3 units transfused   Hypothyroidism    Iron deficiency anemia    Vitamin D deficiency    Review of Systems:  per HPI.   Physical Exam: Vitals:   10/20/21 0949  BP: 138/70  Pulse: 65  Temp: 98.2 F (36.8 C)  TempSrc: Oral  SpO2: 100%  Weight: 237 lb 4.8 oz (107.6 kg)  Height: '5\' 6"'$  (1.676 m)   Constitutional:  appears comfortable  Cardiovascular: Regular rate, regular rhythm. No murmurs, rubs, or gallops. Normal radial and PT pulses bilaterally. No LE edema.  Pulmonary: Normal respiratory effort. No wheezes, rales, or rhonchi.   Abdominal: Normal bowel sounds.  Musculoskeletal: Normal range of motion. Neurological: Alert and oriented to person, place, and time. Non-focal. Skin: warm and dry.    Assessment & Plan:    See Encounters Tab for problem based charting.  Patient seen with Dr. Daryll Drown

## 2021-10-20 NOTE — Assessment & Plan Note (Signed)
RBC and Hgb normal on CBC in 09/2021. Iron studies demonstrate normal iron and ferritin in 07/2021. Patient was prescribed ferrous sulfate 325 mg every other day previously but has not taken this medication in several months.  She endorses occasional fatigue.  Plan: - Repeat iron studies today - Reassess need for ferrous sulfate 325 mg at next visit

## 2021-10-20 NOTE — Assessment & Plan Note (Signed)
>>  ASSESSMENT AND PLAN FOR VITAMIN B12 DEFICIENCY WRITTEN ON 10/30/2021  2:48 PM BY [facility], Claudia Desanctis, MD  Patient has a history of vitamin B12 deficiency. She has experienced numbness and tingling in her hands for several years and attributes this to her vitamin B12 deficiency.  She continues to have this numbness and tingling sensation.  Patient has received vitamin B12 injections previously but is currently only taking over-the-counter vitamin B12. B12 low on most recent labs in 07/2021. The cause of the patients vitamin B12 deficiency has not been worked up previously.   Plan: - Recheck vitamin B12 level at next visit and consider restarting injections - Ordered Methylmalonic acid (MMA) and homocysteine levels to better characterize vitamin B12 deficiency - Ordered autoantibodies to intrinsic factor (IF) to assess for potential pernicious anemia - Ordered total IgA, tissue transglutaminase, and deamidated gliadin peptide to assess for potential celiac disease   Addendum (10/30/2021):  IF abs positive w/ elevated serum IgA. Concerning for pernicious anemia. Patient will need GI referral for evaluation of GI malignancy.  Plan: - Added evaluation of pernicious anemia to her  referral to GI - Patient will need vitamin B12 injections in clinic until her B12 normalizes - Could consider rheumatology referral

## 2021-10-20 NOTE — Assessment & Plan Note (Signed)
Patient was referred for colonoscopy previously but was unable to have this completed due to an issue with the referral.  Plan: - Referral for colonoscopy today

## 2021-10-20 NOTE — Assessment & Plan Note (Addendum)
Patient has a history of vitamin B12 deficiency. She has experienced numbness and tingling in her hands for several years and attributes this to her vitamin B12 deficiency.  She continues to have this numbness and tingling sensation.  Patient has received vitamin B12 injections previously but is currently only taking over-the-counter vitamin B12. B12 low on most recent labs in 07/2021. The cause of the patients vitamin B12 deficiency has not been worked up previously.   Plan: - Recheck vitamin B12 level at next visit and consider restarting injections - Ordered Methylmalonic acid (MMA) and homocysteine levels to better characterize vitamin B12 deficiency - Ordered autoantibodies to intrinsic factor (IF) to assess for potential pernicious anemia - Ordered total IgA, tissue transglutaminase, and deamidated gliadin peptide to assess for potential celiac disease   Addendum (10/30/2021):  IF abs positive w/ elevated serum IgA. Concerning for pernicious anemia. Patient will need GI referral for evaluation of GI malignancy.  Plan: - Added evaluation of pernicious anemia to her  referral to GI - Patient will need vitamin B12 injections in clinic until her B12 normalizes - Could consider rheumatology referral

## 2021-10-20 NOTE — Assessment & Plan Note (Signed)
Patient presented to the ED on 9/16 with worsening anxiety, palpitations, shortness of breath, warmth, and nausea.  It was noted that the patient experienced a similar episode 2 weeks prior.  She denies having dizziness or lightheadedness at that time.  In the ED she was noted to have heart rate in the 160s and SVT on EKG.  Troponin was negative.  Patient was given IV metoprolol and tachycardia resolved.  She was prescribed metoprolol 12.5 mg BID. She states that she has been taking this medication and denies having recurrence of these symptoms since her ED visit.  Patient is scheduled to see cardiology tomorrow.  She denies history of heart conditions, heart failure, abnormal heart rhythms, or having an echo done previously.  Plan: - F/u at scheduled cardiology visit tomorrow - Will not refill metoprolol 12.5 mg BID at this time

## 2021-10-20 NOTE — Assessment & Plan Note (Signed)
TSH was elevated at most recent ED visit on 9/16. She is currently prescribed levothyroxine 50 mcg and states that she takes this medication regularly.  She states that she takes her levothyroxine on an empty stomach, not with citrus, and is not on a PPI currently.  Patient notes a couple pounds of weight gain over the last few months but also states that she has been eating more than usual.  Patient also endorses occasional fatigue.  Plan: - Will increase Levothyroxine to 75 mcg given elevated TSH - Repeat TSH at next visit

## 2021-10-21 ENCOUNTER — Encounter: Payer: Self-pay | Admitting: Cardiovascular Disease

## 2021-10-21 ENCOUNTER — Ambulatory Visit: Payer: Medicaid Other | Attending: Cardiovascular Disease | Admitting: Cardiovascular Disease

## 2021-10-21 VITALS — BP 121/72 | HR 53 | Ht 66.0 in | Wt 235.8 lb

## 2021-10-21 DIAGNOSIS — D510 Vitamin B12 deficiency anemia due to intrinsic factor deficiency: Secondary | ICD-10-CM

## 2021-10-21 DIAGNOSIS — R002 Palpitations: Secondary | ICD-10-CM

## 2021-10-21 DIAGNOSIS — E039 Hypothyroidism, unspecified: Secondary | ICD-10-CM

## 2021-10-21 DIAGNOSIS — I471 Supraventricular tachycardia, unspecified: Secondary | ICD-10-CM

## 2021-10-21 MED ORDER — METOPROLOL TARTRATE 25 MG PO TABS: 12.5000 mg | ORAL_TABLET | Freq: Two times a day (BID) | ORAL | 3 refills | Status: DC

## 2021-10-21 NOTE — Patient Instructions (Signed)
Medication Instructions:  No changes *If you need a refill on your cardiac medications before your next appointment, please call your pharmacy*   Lab Work: None ordered If you have labs (blood work) drawn today and your tests are completely normal, you will receive your results only by: [place] (if you have MyChart) OR A paper copy in the mail If you have any lab test that is abnormal or we need to change your treatment, we will call you to review the results.   Testing/Procedures: Your physician has requested that you have an echocardiogram. Echocardiography is a painless test that uses sound waves to create images of your heart. It provides your doctor with information about the size and shape of your heart and how well your heart's chambers and valves are working. You may receive an ultrasound enhancing agent through an IV if needed to better visualize your heart during the echo.This procedure takes approximately one hour. There are no restrictions for this procedure. This will take place at the [address], [address].     Follow-Up: At [facility], you and your health needs are our priority.  As part of our continuing mission to provide you with exceptional heart care, we have created designated Provider Care Teams.  These Care Teams include your primary Cardiologist (physician) and Advanced Practice Providers (APPs -  Physician Assistants and Nurse Practitioners) who all work together to provide you with the care you need, when you need it.  We recommend signing up for the patient portal called "MyChart".  Sign up information is provided on this After Visit Summary.  MyChart is used to connect with patients for Virtual Visits (Telemedicine).  Patients are able to view lab/test results, encounter notes, upcoming appointments, etc.  Non-urgent messages can be sent to your provider as well.   To learn more about what you can do with MyChart, go to  [email or web address].    Your next appointment:   12 month(s)  The format for your next appointment:   In Person  Provider:   Dr. Sallyanne Kuster  Important Information About Sugar

## 2021-10-21 NOTE — Progress Notes (Signed)
Cardiology Office Note:    Date:  10/25/2021   ID:  [name], DOB 1970-03-11, MRN [id number]  PCP:  Starlyn Skeans, MD   [place] Providers Cardiologist:  Sanda Klein, MD     Referring MD: Ripley Fraise, MD   Chief Complaint  Patient presents with   Irregular Heart Beat       Kimberly ROSCH is a 51 y.o. female who is being seen today for the evaluation of SVT at the request of Ripley Fraise, MD.   History of Present Illness:    [name] is a 51 y.o. 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  She was at rest, under no particular emotional or physical stress when she suddenly felt very hot, fidgety" excited".  She had only a very vague awareness that her heart was beating fast.  She did not have significant dyspnea, presyncope or any chest pain.  She felt to be in.  She needs to use the restroom.  These episodes have occurred in the past and usually resolved by taking deep breaths, but this episode was quite lengthy.  When she arrived at the emergency room she was in narrow complex tachycardia at 167 bpm.  As they were preparing to inject intravenous adenosine and she converted spontaneously to normal sinus rhythm.  She was discharged from the emergency room on metoprolol tartrate 12.5 mg twice daily.  She has not had any new episodes since then.  The ECG during arrhythmia shows narrow complex tachycardia with a short RP mechanism, but with a longer than usual RP interval with a retrograde.  There appears to be well outside of the terminal QRS complex.  The ECG in sinus rhythm does not show any evidence of preexcitation although PVCs are seen.  None of the ECG showed ischemic repolarization abnormalities.  Electrocardiogram performed in the office  today shows sinus bradycardia at 53 bpm, again without evidence of preexcitation or repolarization abnormalities.  PR interval 148 ms.  QT interval was normal at 416 ms.  Similar episodes have happened frequently, but briefly over the last 10 years or so.  She has never experienced syncope.  She denies exertional angina or dyspnea, orthopnea, PND, lower extremity edema, focal neurological complaints, intermittent claudication.  She has chronic problems with pain in her right foot assumed to be due to planter fasciitis.  She has chronic back pain.  She takes levothyroxine supplement for hypothyroidism and her recent TSH was actually high at 12.479 suggesting she was slightly hypothyroid.  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  Her total cholesterol 3 months ago was 248 with an LDL of 173 and HDL of 57 and normal triglycerides.  She had a total hysterectomy with bilateral salpingectomy in 2015 after having severe dysfunctional uterine bleeding due to fibroids, requiring blood transfusion.  Past Medical History:  Diagnosis Date   B12 deficiency    Back pain    Elevated blood pressure reading    Fibroids, submucosal    History of blood transfusion 02/19/2011   [facility] - ? 2-3 units transfused   Hypothyroidism    Iron deficiency anemia    Vitamin D deficiency     Past Surgical History:  Procedure Laterality Date   ANTERIOR CERVICAL DECOMPRESSION/DISCECTOMY FUSION  4 LEVELS N/A 04/26/2019   Procedure: ANTERIOR CERVICAL DECOMPRESSION FUSION CERVICAL 4-5, CERVICAL 5-6, CERVICAL 6-7 WITH INSTRUMENTATION AND ALLOGRAFT;  Surgeon: Phylliss Bob, MD;  Location: [facility];  Service: Orthopedics;  Laterality: N/A;   APPENDECTOMY     BILATERAL SALPINGECTOMY Bilateral 04/16/2013   Procedure: BILATERAL SALPINGECTOMY;  Surgeon: Lavonia Drafts, MD;  Location: [facility];  Service: Gynecology;  Laterality: Bilateral;   CESAREAN SECTION   1994, 2002   DILATION AND CURETTAGE OF UTERUS N/A 03/31/2012   Procedure: DILATATION AND CURETTAGE;  Surgeon: Emily Filbert, MD;  Location: [facility];  Service: Gynecology;  Laterality: N/A;   LAPAROSCOPIC LYSIS OF ADHESIONS N/A 03/31/2012   Procedure: LAPAROSCOPIC LYSIS OF ADHESIONS;  Surgeon: Emily Filbert, MD;  Location: [facility];  Service: Gynecology;  Laterality: N/A;   LAPAROSCOPY N/A 03/31/2012   Procedure: LAPAROSCOPY OPERATIVE;  Surgeon: Emily Filbert, MD;  Location: [facility];  Service: Gynecology;  Laterality: N/A;   NOVASURE ABLATION N/A 03/31/2012   Procedure: NOVASURE ABLATION;  Surgeon: Emily Filbert, MD;  Location: [facility];  Service: Gynecology;  Laterality: N/A;   ROBOTIC ASSISTED TOTAL HYSTERECTOMY N/A 04/16/2013   Procedure: ROBOTIC ASSISTED TOTAL HYSTERECTOMY;  Surgeon: Lavonia Drafts, MD;  Location: [facility];  Service: Gynecology;  Laterality: N/A;   TUBAL LIGATION  2002   WISDOM TOOTH EXTRACTION      Current Medications: Current Meds  Medication Sig   acetaminophen (TYLENOL) 500 MG tablet Take 2 tablets (1,000 mg total) by mouth every 6 (six) hours as needed for moderate pain or headache.   cyanocobalamin (,VITAMIN B-12,) 1000 MCG/ML injection    ferrous sulfate 325 (65 FE) MG tablet Take 1 tablet (325 mg total) by mouth daily with breakfast.   levothyroxine (SYNTHROID) 75 MCG tablet Take 1 tablet (75 mcg total) by mouth daily.   meloxicam (MOBIC) 7.5 MG tablet Take 1 tablet (7.5 mg total) by mouth daily.   Vitamin D, Ergocalciferol, (DRISDOL) 1.25 MG (50000 UNIT) CAPS capsule Take 1 capsule (50,000 Units total) by mouth every 7 (seven) days.   [DISCONTINUED] metoprolol tartrate (LOPRESSOR) 25 MG tablet Take 0.5 tablets (12.5 mg total) by mouth 2 (two) times daily.     Allergies:   Bee venom, Bactrim [sulfamethoxazole-trimethoprim], Codeine, and Sulfamethoxazole-trimethoprim   Social History   Socioeconomic History   Marital status: Single    Spouse name: Not on file   Number  of children: Not on file   Years of education: Not on file   Highest education level: Not on file  Occupational History   Not on file  Tobacco Use   Smoking status: Former    Packs/day: 0.00    Types: Cigarettes    Quit date: 01/21/2011    Years since quitting: 10.7   Smokeless tobacco: Never  Vaping Use   Vaping Use: Never used  Substance and Sexual Activity   Alcohol use: Yes    Comment: social occasions   Drug use: No   Sexual activity: Not Currently    Birth control/protection: Condom, Surgical  Other Topics Concern   Not on file  Social History Narrative   Not on file   Social Determinants of Health   Financial Resource Strain: Not on file  Food Insecurity: Not on file  Transportation Needs: Not on file  Physical Activity: Not on file  Stress: Not on file  Social Connections: Not on file     Family History: The patient's family history includes Fibroids in her sister; Hypertension in  her mother; Seizures in her father; Sudden death in her father. There is no history of Breast cancer.  ROS:   Please see the history of present illness.     All other systems reviewed and are negative.  EKGs/Labs/Other Studies Reviewed:    The following studies were reviewed today: Notes, ECG tracings, x-rays and labs from recent emergency room visit.  EKG:  EKG is  ordered today.  The ekg ordered today demonstrates sinus bradycardia, no preexcitation, no repolarization abnormalities, QTc 416 ms  Recent Labs: 07/22/2021: ALT 14 10/03/2021: BUN 15; Creatinine, Ser 0.75; Hemoglobin 13.6; Platelets 367; Potassium 4.0; Sodium 138; TSH 12.479  Recent Lipid Panel    Component Value Date/Time   CHOL 248 (H) 07/22/2021 1051   TRIG 104 07/22/2021 1051   HDL 57 07/22/2021 1051   LDLCALC 173 (H) 07/22/2021 1051     Risk Assessment/Calculations:             Physical Exam:    VS:  BP 121/72 (BP Location: Right Arm, Patient Position: Sitting, Cuff Size: Large)   Pulse (!) 53    Ht '5\' 6"'$  (1.676 m)   Wt 235 lb 12.8 oz (107 kg)   LMP 03/31/2013 (Exact Date)   SpO2 96%   BMI 38.06 kg/m     Wt Readings from Last 3 Encounters:  10/21/21 235 lb 12.8 oz (107 kg)  10/20/21 237 lb 4.8 oz (107.6 kg)  10/12/21 232 lb (105.2 kg)     GEN: Severely obese, well nourished, well developed in no acute distress HEENT: Normal NECK: No JVD; No carotid bruits LYMPHATICS: No lymphadenopathy CARDIAC: RRR, no murmurs, rubs, gallops RESPIRATORY:  Clear to auscultation without rales, wheezing or rhonchi  ABDOMEN: Soft, non-tender, non-distended MUSCULOSKELETAL:  No edema; No deformity  SKIN: Warm and dry NEUROLOGIC:  Alert and oriented x 3 PSYCHIATRIC:  Normal affect   ASSESSMENT:    1. SVT (supraventricular tachycardia)   2. Pernicious anemia   3. 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  So far doing well on beta-blocker therapy.  If she has recurrent events on beta-blockers we may be limited by sinus bradycardia and she may do better with referral for ablation.  We will check an echocardiogram to exclude structural heart disease.  Would be helpful to have a personal electronic rhythm monitoring device such as a smart watch or Kardia. Pernicious anemia: Receiving B12 supplements. Hypothyroidism: Recent TSH was mildly elevated, but TSH, free T3 and free T4 were all normal on 07/22/2021.  Defer to PCP/endocrinologist.           Medication Adjustments/Labs and Tests Ordered: Current medicines are reviewed at length with the patient today.  Concerns regarding medicines are outlined above.  Orders Placed This Encounter  Procedures   EKG 12-Lead   ECHOCARDIOGRAM COMPLETE   Meds ordered this encounter  Medications   metoprolol tartrate (LOPRESSOR) 25 MG tablet    Sig: Take 0.5 tablets  (12.5 mg total) by mouth 2 (two) times daily.    Dispense:  90 tablet    Refill:  3    Patient Instructions  Medication Instructions:  No changes *If you need a refill on your cardiac medications before your next appointment, please call your pharmacy*   Lab Work: None ordered If you have labs (blood  work) drawn today and your tests are completely normal, you will receive your results only by: [facility] (if you have MyChart) OR A paper copy in the mail If you have any lab test that is abnormal or we need to change your treatment, we will call you to review the results.   Testing/Procedures: Your physician has requested that you have an echocardiogram. Echocardiography is a painless test that uses sound waves to create images of your heart. It provides your doctor with information about the size and shape of your heart and how well your heart's chambers and valves are working. You may receive an ultrasound enhancing agent through an IV if needed to better visualize your heart during the echo.This procedure takes approximately one hour. There are no restrictions for this procedure. This will take place at the [address].     Follow-Up: At [facility], you and your health needs are our priority.  As part of our continuing mission to provide you with exceptional heart care, we have created designated Provider Care Teams.  These Care Teams include your primary Cardiologist (physician) and Advanced Practice Providers (APPs -  Physician Assistants and Nurse Practitioners) who all work together to provide you with the care you need, when you need it.  We recommend signing up for the patient portal called "MyChart".  Sign up information is provided on this After Visit Summary.  MyChart is used to connect with patients for Virtual Visits (Telemedicine).  Patients are able to view lab/test results, encounter notes, upcoming appointments, etc.  Non-urgent messages can  be sent to your provider as well.   To learn more about what you can do with MyChart, go to [email or web address].    Your next appointment:   12 month(s)  The format for your next appointment:   In Person  Provider:   Dr. Sallyanne Kuster  Important Information About Sugar         Signed, Sanda Klein, MD  10/25/2021 7:18 PM    [place]

## 2021-10-23 LAB — IRON,TIBC AND FERRITIN PANEL
Ferritin: 16 ng/mL (ref 15–150)
Iron Saturation: 20 % (ref 15–55)
Iron: 75 ug/dL (ref 27–159)
Total Iron Binding Capacity: 372 ug/dL (ref 250–450)
UIBC: 297 ug/dL (ref 131–425)

## 2021-10-23 LAB — GLIADIN DEAMIDATED PEPT AB,IGA: Antigliadin Abs, IgA: 6 units (ref 0–19)

## 2021-10-23 LAB — HOMOCYSTEINE: Homocysteine: 14.3 umol/L (ref 0.0–14.5)

## 2021-10-23 LAB — METHYLMALONIC ACID, SERUM: Methylmalonic Acid: 114 nmol/L (ref 0–378)

## 2021-10-23 LAB — INTRINSIC FACTOR ANTIBODIES: Intrinsic Factor Abs, Serum: 70.8 AU/mL — ABNORMAL HIGH (ref 0.0–1.1)

## 2021-10-23 LAB — TISSUE TRANSGLUTAMINASE, IGA: Transglutaminase IgA: <2 U/mL (ref 0–3)

## 2021-10-23 LAB — IGA: IgA/Immunoglobulin A, Serum: 381 mg/dL — ABNORMAL HIGH (ref 87–352)

## 2021-10-23 NOTE — Progress Notes (Signed)
I called the patient and updated her on the results of her labs that are concerning for pernicious anemia. Discussed that our management of her vitamin B12 deficiency will not change. Discussed continuing to take OTC vitamin B12 at home and that we will check her vitamin B12 level periodically. Discussed that we can continue with vitamin B12 injections in clinic if indicated.

## 2021-10-25 ENCOUNTER — Encounter: Payer: Self-pay | Admitting: Cardiovascular Disease

## 2021-10-27 DIAGNOSIS — M722 Plantar fascial fibromatosis: Secondary | ICD-10-CM | POA: Diagnosis not present

## 2021-10-29 ENCOUNTER — Other Ambulatory Visit (INDEPENDENT_AMBULATORY_CARE_PROVIDER_SITE_OTHER): Payer: Self-pay | Admitting: Bariatrics

## 2021-10-29 DIAGNOSIS — E559 Vitamin D deficiency, unspecified: Secondary | ICD-10-CM

## 2021-10-30 NOTE — Progress Notes (Signed)
Internal Medicine Clinic Attending  Case discussed with Dr. Alton Revere  at the time of the visit.  We reviewed the resident's history and exam and pertinent patient test results.  I agree with the assessment, diagnosis, and plan of care documented in the resident's note.   IF Antibodies +.  She will need follow up for SQ injections of B12 depending on level, and referral to GI for routine surveillance as auto-antibodies can be associated with GI malignancy.

## 2021-10-30 NOTE — Addendum Note (Signed)
Addended by: Starlyn Skeans on: 10/30/2021 02:57 PM   Modules accepted: Orders

## 2021-10-31 ENCOUNTER — Other Ambulatory Visit: Payer: Self-pay | Admitting: Cardiovascular Disease

## 2021-11-02 ENCOUNTER — Ambulatory Visit: Payer: Medicaid Other | Admitting: Bariatrics

## 2021-11-04 ENCOUNTER — Telehealth: Payer: Self-pay | Admitting: Cardiovascular Disease

## 2021-11-04 MED ORDER — METOPROLOL TARTRATE 25 MG PO TABS: 12.5000 mg | ORAL_TABLET | Freq: Two times a day (BID) | ORAL | 3 refills | Status: DC

## 2021-11-04 NOTE — Telephone Encounter (Signed)
Pt c/o medication issue:  1. Name of Medication: metoprolol tartrate (LOPRESSOR) 25 MG tablet  2. How are you currently taking this medication (dosage and times per day)?   3. Are you having a reaction (difficulty breathing--STAT)?   4. What is your medication issue? Pt is on her last pill today and she says the pharmacy did not receive this medication. Please resend.

## 2021-11-04 NOTE — Telephone Encounter (Signed)
Rx resent to pharmacy-patient aware.   metoprolol tartrate (LOPRESSOR) 25 MG tablet 90 tablet 3 11/04/2021    Sig - Route: Take 0.5 tablets (12.5 mg total) by mouth 2 (two) times daily. - Oral   Sent to pharmacy as: metoprolol tartrate (LOPRESSOR) 25 MG tablet   E-Prescribing Status: Receipt confirmed by pharmacy (11/04/2021  9:16 AM EDT)    [id number] - [place], [place] - [address] AT [place]

## 2021-11-09 ENCOUNTER — Ambulatory Visit (HOSPITAL_COMMUNITY): Payer: Medicaid Other | Attending: Cardiovascular Disease

## 2021-11-09 DIAGNOSIS — I471 Supraventricular tachycardia, unspecified: Secondary | ICD-10-CM | POA: Diagnosis not present

## 2021-11-09 LAB — ECHOCARDIOGRAM COMPLETE
Area-P 1/2: 3.08 cm2
S' Lateral: 2.9 cm

## 2021-11-10 DIAGNOSIS — M79671 Pain in right foot: Secondary | ICD-10-CM | POA: Diagnosis not present

## 2021-11-17 DIAGNOSIS — M79671 Pain in right foot: Secondary | ICD-10-CM | POA: Diagnosis not present

## 2021-11-23 ENCOUNTER — Encounter: Payer: Medicaid Other | Admitting: Student

## 2021-11-24 DIAGNOSIS — M79671 Pain in right foot: Secondary | ICD-10-CM | POA: Diagnosis not present

## 2021-11-26 ENCOUNTER — Ambulatory Visit: Payer: Medicaid Other | Admitting: Student

## 2021-11-26 ENCOUNTER — Encounter: Payer: Self-pay | Admitting: Student

## 2021-11-26 VITALS — BP 146/75 | HR 68 | Temp 98.1°F | Ht 66.0 in | Wt 239.3 lb

## 2021-11-26 DIAGNOSIS — E559 Vitamin D deficiency, unspecified: Secondary | ICD-10-CM | POA: Diagnosis not present

## 2021-11-26 DIAGNOSIS — D510 Vitamin B12 deficiency anemia due to intrinsic factor deficiency: Secondary | ICD-10-CM

## 2021-11-26 DIAGNOSIS — M7712 Lateral epicondylitis, left elbow: Secondary | ICD-10-CM | POA: Diagnosis not present

## 2021-11-26 DIAGNOSIS — E063 Autoimmune thyroiditis: Secondary | ICD-10-CM | POA: Diagnosis not present

## 2021-11-26 DIAGNOSIS — M7710 Lateral epicondylitis, unspecified elbow: Secondary | ICD-10-CM | POA: Insufficient documentation

## 2021-11-26 NOTE — Assessment & Plan Note (Addendum)
Patient with history of cervical myelopathy s/p cervical spine surgery, presenting with left arm pain for the past week. 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 Her pain is located on the lateral aspect of the arm, from her mid-forearm to elbow. Pain is worse with extension of her arm, but other ROM is intact.  On exam of her left arm, she has tenderness to palpation of lateral epicondyle and radial head. She also has pain in her lateral forearm and lateral elbow when resistance is applied against supination. Negative tinnel sign in radial tunnel and no tenderness with manipulation of digits in left arm. No overlying warmth, erythema, swelling.  Given acute left arm pain with TTP of lateral epicondyle and pain with resistance to supination, my top differential is lateral epicondylitis. 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 Advised patient to use heating pad, ibuprofen prn, and provided pamphlet to educate patient about lateral epicondylitis.  Plan: -heating pad -ibuprofen prn

## 2021-11-26 NOTE — Assessment & Plan Note (Addendum)
Patient living with pernicious anemia (elevated intrinsic factor antibody). She was previously receiving B12 injections (1014mg weekly) which she was finding benefit from. She was recently transitioned to oral supplementation and feels like her energy is not the same as when she was receiving injections. Will check B12 level today and see if patient has impaired oral absorption.   Plan: -continue oral supplementation for now -f/u B12 level -change to weekly injections if remains deficient despite oral supplementation  ADDENDUM: Vitamin B12 level 1129, suggesting adequate absorption with current oral supplementation.

## 2021-11-26 NOTE — Patient Instructions (Signed)
[name],  It was a pleasure seeing you in the clinic today.   I believe that your arm pain is from inflammation in that area. I have provided a pamphlet for your information. Please take ibuprofen as needed and ice the area to help with the pain. Also, try to limit strenuous activities with that arm to let it heal. We are checking some labs today -- I will call you with these results. Please follow up in 3 months for your next visit.  Please call our clinic at [phone] if you have any questions or concerns. The best time to call is Monday-Friday from 9am-4pm, but there is someone available 24/7 at the same number. If you need medication refills, please notify your pharmacy one week in advance and they will send [place] a request.   Thank you for letting us take part in your care. We look forward to seeing you next time!

## 2021-11-26 NOTE — Assessment & Plan Note (Addendum)
Was taking vitamin D supplementation at 50000UT weekly for 5 doses, with her last dose about 1 week ago. Will check vitamin D level today.  Plan: -f/u vitamin D level  ADDENDUM: Vitamin D level of 22, mildly low. Advised supplementation with 1000 IU daily.

## 2021-11-26 NOTE — Assessment & Plan Note (Addendum)
Patient currently takes synthroid 81mg daily, recently increased from 551m at last visit. Will recheck TSH level today to see if it has normalized.  -continue synthroid 7546mdaily -f/u TSH  ADDENDUM: TSH normalized, continue current dosing

## 2021-11-26 NOTE — Progress Notes (Signed)
   CC: evaluation of L arm pain  HPI:  [name] is a 51 y.o. female with history listed below presenting to the [facility] for evaluation of left arm pain. Please see individualized problem based charting for full HPI.  Past Medical History:  Diagnosis Date   B12 deficiency    Back pain    Elevated blood pressure reading    Fibroids, submucosal    History of blood transfusion 02/19/2011   [facility] - ? 2-3 units transfused   Hypothyroidism    Iron deficiency anemia    Vitamin D deficiency     Review of Systems:  Negative aside from that listed in individualized problem based charting.  Physical Exam:  Vitals:   11/26/21 1017 11/26/21 1035  BP: (!) 140/72 (!) 146/75  Pulse: 76 68  Temp: 98.1 F (36.7 C)   TempSrc: Oral   SpO2: 99%   Weight: 239 lb 4.8 oz (108.5 kg)   Height: '5\' 6"'$  (1.676 m)    Physical Exam Constitutional:      Appearance: She is obese. She is not ill-appearing.  HENT:     Mouth/Throat:     Mouth: Mucous membranes are moist.     Pharynx: Oropharynx is clear.  Eyes:     Extraocular Movements: Extraocular movements intact.     Conjunctiva/sclera: Conjunctivae normal.     Pupils: Pupils are equal, round, and reactive to light.  Cardiovascular:     Rate and Rhythm: Normal rate and regular rhythm.     Pulses: Normal pulses.     Heart sounds: Normal heart sounds. No murmur heard.    No friction rub. No gallop.  Pulmonary:     Effort: Pulmonary effort is normal.     Breath sounds: Normal breath sounds. No wheezing, rhonchi or rales.  Abdominal:     General: Bowel sounds are normal. There is no distension.     Palpations: Abdomen is soft.     Tenderness: There is no abdominal tenderness.  Musculoskeletal:     Comments: Pain in forearm and lateral elbow with resistance to supination of left arm. Tenderness to palpation of lateral epicondyle and radial head. Negative tinnel sign in radial tunnel. No tenderness with manipulation of digits in left arm. No  overlying redness, erythema, swelling. No numbness or tingling with manipulation of left arm.  Skin:    General: Skin is warm and dry.  Neurological:     General: No focal deficit present.     Mental Status: She is alert and oriented to person, place, and time.  Psychiatric:        Mood and Affect: Mood normal.        Behavior: Behavior normal.      Assessment & Plan:   See Encounters Tab for problem based charting.  Patient discussed with Dr. Philipp Ovens

## 2021-11-27 LAB — VITAMIN D 25 HYDROXY (VIT D DEFICIENCY, FRACTURES): Vit D, 25-Hydroxy: 22 ng/mL — ABNORMAL LOW (ref 30.0–100.0)

## 2021-11-27 LAB — TSH: TSH: 3.07 u[IU]/mL (ref 0.450–4.500)

## 2021-11-27 LAB — VITAMIN B12: Vitamin B-12: 1129 pg/mL (ref 232–1245)

## 2021-11-27 NOTE — Progress Notes (Signed)
Internal Medicine Clinic Attending  Case discussed with Dr. Jinwala  At the time of the visit.  We reviewed the resident's history and exam and pertinent patient test results.  I agree with the assessment, diagnosis, and plan of care documented in the resident's note.  

## 2021-12-03 DIAGNOSIS — M79672 Pain in left foot: Secondary | ICD-10-CM | POA: Diagnosis not present

## 2021-12-03 DIAGNOSIS — M722 Plantar fascial fibromatosis: Secondary | ICD-10-CM | POA: Diagnosis not present

## 2021-12-16 ENCOUNTER — Encounter (HOSPITAL_COMMUNITY): Payer: Self-pay

## 2021-12-16 ENCOUNTER — Ambulatory Visit (HOSPITAL_COMMUNITY)
Admission: EM | Admit: 2021-12-16 | Discharge: 2021-12-16 | Disposition: A | Discharge status: Home / Self Care | Payer: Medicaid Other | Attending: Internal Medicine | Admitting: Internal Medicine

## 2021-12-16 DIAGNOSIS — R059 Cough, unspecified: Secondary | ICD-10-CM | POA: Diagnosis present

## 2021-12-16 DIAGNOSIS — J069 Acute upper respiratory infection, unspecified: Secondary | ICD-10-CM | POA: Diagnosis not present

## 2021-12-16 DIAGNOSIS — Z87891 Personal history of nicotine dependence: Secondary | ICD-10-CM | POA: Insufficient documentation

## 2021-12-16 DIAGNOSIS — Z20822 Contact with and (suspected) exposure to covid-19: Secondary | ICD-10-CM | POA: Diagnosis not present

## 2021-12-16 LAB — POC INFLUENZA A AND B ANTIGEN (URGENT CARE ONLY)
INFLUENZA A ANTIGEN, POC: NEGATIVE
INFLUENZA B ANTIGEN, POC: NEGATIVE

## 2021-12-16 NOTE — ED Provider Notes (Signed)
[place]   [id number] 12/16/21 Arrival Time: 0801  ASSESSMENT & PLAN:  1. Viral upper respiratory tract infection   -History and exam consistent with a viral URI.  Point-of-care flu was negative today.  I have high suspicion for COVID.  We will call her if her COVID test is positive.  We discussed conservative treatment including over-the-counter ibuprofen for aches and cough medicine for symptomatic relief.  Encouraged vigorous p.o. hydration.  All questions were answered she agrees to plan.   No orders of the defined types were placed in this encounter.  Discharge Instructions   None       Reviewed expectations re: course of current medical issues. Questions answered. Outlined signs and symptoms indicating need for more acute intervention. Patient verbalized understanding. After Visit Summary given.   SUBJECTIVE: Pleasant 51 year old female comes urgent care to be evaluated for cough, fatigue, body aches, and ear pain x 3 days.  She was with a friend this weekend and her friend texted her on Monday and telling her she has COVID.  Patient symptoms started on Monday with bodyaches.  In the last 48 hours she is developed a constant cough.  Her chest is sore from coughing.  The cough is productive with yellow sputum.  Worse when laying down.  She is also been congested.  She denies any fevers.  Denies any nausea or vomiting.  She is vaccinated against COVID.  She did not get her flu shot this year.  Patient's last menstrual period was 03/31/2013 (exact date). Past Surgical History:  Procedure Laterality Date   ANTERIOR CERVICAL DECOMPRESSION/DISCECTOMY FUSION 4 LEVELS N/A 04/26/2019   Procedure: ANTERIOR CERVICAL DECOMPRESSION FUSION CERVICAL 4-5, CERVICAL 5-6, CERVICAL 6-7 WITH INSTRUMENTATION AND ALLOGRAFT;  Surgeon: Phylliss Bob, MD;  Location: [facility];  Service: Orthopedics;  Laterality: N/A;   APPENDECTOMY     BILATERAL SALPINGECTOMY Bilateral 04/16/2013    Procedure: BILATERAL SALPINGECTOMY;  Surgeon: Lavonia Drafts, MD;  Location: [facility];  Service: Gynecology;  Laterality: Bilateral;   CESAREAN SECTION  1994, 2002   DILATION AND CURETTAGE OF UTERUS N/A 03/31/2012   Procedure: DILATATION AND CURETTAGE;  Surgeon: Emily Filbert, MD;  Location: [facility];  Service: Gynecology;  Laterality: N/A;   LAPAROSCOPIC LYSIS OF ADHESIONS N/A 03/31/2012   Procedure: LAPAROSCOPIC LYSIS OF ADHESIONS;  Surgeon: Emily Filbert, MD;  Location: [facility];  Service: Gynecology;  Laterality: N/A;   LAPAROSCOPY N/A 03/31/2012   Procedure: LAPAROSCOPY OPERATIVE;  Surgeon: Emily Filbert, MD;  Location: [facility];  Service: Gynecology;  Laterality: N/A;   NOVASURE ABLATION N/A 03/31/2012   Procedure: NOVASURE ABLATION;  Surgeon: Emily Filbert, MD;  Location: [facility];  Service: Gynecology;  Laterality: N/A;   ROBOTIC ASSISTED TOTAL HYSTERECTOMY N/A 04/16/2013   Procedure: ROBOTIC ASSISTED TOTAL HYSTERECTOMY;  Surgeon: Lavonia Drafts, MD;  Location: [facility];  Service: Gynecology;  Laterality: N/A;   TUBAL LIGATION  2002   WISDOM TOOTH EXTRACTION       OBJECTIVE:  Vitals:   12/16/21 0818  BP: (!) 152/75  Pulse: 71  Resp: 16  Temp: 98 F (36.7 C)  TempSrc: Oral  SpO2: 95%     Physical Exam Vitals reviewed.  Constitutional:      Appearance: Normal appearance. She is not ill-appearing.  HENT:     Head: Normocephalic.     Right Ear: Tympanic membrane is erythematous.     Left Ear: Tympanic membrane is erythematous.     Nose: Congestion present.  Mouth/Throat:     Mouth: Mucous membranes are moist.     Pharynx: Posterior oropharyngeal erythema present.  Cardiovascular:     Rate and Rhythm: Normal rate.  Pulmonary:     Effort: Pulmonary effort is normal.  Musculoskeletal:        General: Normal range of motion.     Cervical back: Normal range of motion.  Lymphadenopathy:     Cervical: Cervical adenopathy present.  Neurological:     General: No focal deficit  present.  Psychiatric:        Mood and Affect: Mood normal.      Labs: Results for orders placed or performed in visit on 11/26/21  TSH  Result Value Ref Range   TSH 3.070 0.450 - 4.500 uIU/mL  Vitamin B12  Result Value Ref Range   Vitamin B-12 1,129 232 - 1,245 pg/mL  Vitamin D (25 hydroxy)  Result Value Ref Range   Vit D, 25-Hydroxy 22.0 (L) 30.0 - 100.0 ng/mL   Labs Reviewed  SARS CORONAVIRUS 2 (TAT 6-24 HRS)  POC INFLUENZA A AND B ANTIGEN (URGENT CARE ONLY)    Imaging: No results found.   Allergies  Allergen Reactions   Bee Venom Anaphylaxis   Bactrim [Sulfamethoxazole-Trimethoprim] Itching   Codeine Nausea And Vomiting   Sulfamethoxazole-Trimethoprim Itching                                               Past Medical History:  Diagnosis Date   B12 deficiency    Back pain    Elevated blood pressure reading    Fibroids, submucosal    History of blood transfusion 02/19/2011   [facility] - ? 2-3 units transfused   Hypothyroidism    Iron deficiency anemia    Vitamin D deficiency     Social History   Socioeconomic History   Marital status: Single    Spouse name: Not on file   Number of children: Not on file   Years of education: Not on file   Highest education level: Not on file  Occupational History   Not on file  Tobacco Use   Smoking status: Former    Packs/day: 0.00    Types: Cigarettes    Quit date: 01/21/2011    Years since quitting: 10.9   Smokeless tobacco: Never  Vaping Use   Vaping Use: Never used  Substance and Sexual Activity   Alcohol use: Yes    Comment: social occasions   Drug use: No   Sexual activity: Not Currently    Birth control/protection: Condom, Surgical  Other Topics Concern   Not on file  Social History Narrative   Not on file   Social Determinants of Health   Financial Resource Strain: Medium Risk (11/26/2021)   Overall Financial Resource Strain (CARDIA)    Difficulty of Paying Living Expenses: Somewhat hard  Food  Insecurity: No Food Insecurity (11/26/2021)   Hunger Vital Sign    Worried About Running Out of Food in the Last Year: Never true    Ran Out of Food in the Last Year: Never true  Transportation Needs: No Transportation Needs (11/26/2021)   PRAPARE - Hydrologist (Medical): No    Lack of Transportation (Non-Medical): No  Physical Activity: Not on file  Stress: Not on file  Social Connections: Moderately Isolated (11/26/2021)   Social  Connection and Isolation Panel [NHANES]    Frequency of Communication with Friends and Family: More than three times a week    Frequency of Social Gatherings with Friends and Family: Once a week    Attends Religious Services: More than 4 times per year    Active Member of [employer] or Organizations: No    Attends Archivist Meetings: Never    Marital Status: Never married  Intimate Partner Violence: Not At Risk (11/26/2021)   Humiliation, Afraid, Rape, and Kick questionnaire    Fear of Current or Ex-Partner: No    Emotionally Abused: No    Physically Abused: No    Sexually Abused: No    Family History  Problem Relation Age of Onset   Hypertension Mother    Seizures Father    Sudden death Father    Fibroids Sister    Breast cancer Neg Hx       Abdoul Encinas, Dorian Pod, MD 12/16/21 [phone]

## 2021-12-16 NOTE — ED Triage Notes (Signed)
Pt is here for covid exposure bilateral ear pain , and cough, headache , low energy

## 2021-12-16 NOTE — Discharge Instructions (Signed)

## 2021-12-17 LAB — SARS CORONAVIRUS 2 (TAT 6-24 HRS): SARS Coronavirus 2: NEGATIVE

## 2021-12-31 ENCOUNTER — Encounter (HOSPITAL_BASED_OUTPATIENT_CLINIC_OR_DEPARTMENT_OTHER): Payer: Self-pay

## 2021-12-31 ENCOUNTER — Emergency Department (HOSPITAL_BASED_OUTPATIENT_CLINIC_OR_DEPARTMENT_OTHER)
Admission: EM | Admit: 2021-12-31 | Discharge: 2021-12-31 | Disposition: A | Discharge status: Home / Self Care | Payer: Medicaid Other | Attending: Emergency Medicine | Admitting: Emergency Medicine

## 2021-12-31 ENCOUNTER — Other Ambulatory Visit: Payer: Self-pay

## 2021-12-31 DIAGNOSIS — S61315A Laceration without foreign body of left ring finger with damage to nail, initial encounter: Secondary | ICD-10-CM | POA: Insufficient documentation

## 2021-12-31 DIAGNOSIS — S61215A Laceration without foreign body of left ring finger without damage to nail, initial encounter: Secondary | ICD-10-CM

## 2021-12-31 DIAGNOSIS — X58XXXA Exposure to other specified factors, initial encounter: Secondary | ICD-10-CM | POA: Insufficient documentation

## 2021-12-31 DIAGNOSIS — Z23 Encounter for immunization: Secondary | ICD-10-CM | POA: Insufficient documentation

## 2021-12-31 DIAGNOSIS — S6992XA Unspecified injury of left wrist, hand and finger(s), initial encounter: Secondary | ICD-10-CM | POA: Diagnosis present

## 2021-12-31 MED ORDER — LIDOCAINE HCL (PF) 1 % IJ SOLN
5.0000 mL | Freq: Once | INTRAMUSCULAR | Status: AC
  Administered 2021-12-31: 5 mL
  Filled 2021-12-31: qty 5

## 2021-12-31 MED ORDER — TETANUS-DIPHTH-ACELL PERTUSSIS 5-2.5-18.5 LF-MCG/0.5 IM SUSY
0.5000 mL | PREFILLED_SYRINGE | Freq: Once | INTRAMUSCULAR | Status: AC
  Administered 2021-12-31: 0.5 mL via INTRAMUSCULAR
  Filled 2021-12-31: qty 0.5

## 2021-12-31 NOTE — ED Triage Notes (Addendum)
Patient here POV from Home.  Endorses Lacerating her Left Fourth Anterior Digit. 1 cm laceration with Bleeding Controlled. Unknown Tetanus  NAD Noted during Triage. A&Ox4. GCS 15. Ambulatory.

## 2021-12-31 NOTE — ED Provider Notes (Signed)
[facility] EMERGENCY DEPT Provider Note   CSN: [id number] Arrival date & time: 12/31/21  1451     History  Chief Complaint  Patient presents with   Laceration    [name] is a 51 y.o. female patient who presents to the emergency department today for further evaluation of a laceration over the left fourth anterior digit.  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  Bleeding is controlled with direct pressure.  She denies numbness or weakness to the finger.  Denies any other injury.  Tetanus is not up-to-date.  Laceration      Home Medications Prior to Admission medications   Medication Sig Start Date End Date Taking? Authorizing Provider  acetaminophen (TYLENOL) 500 MG tablet Take 2 tablets (1,000 mg total) by mouth every 6 (six) hours as needed for moderate pain or headache. 02/25/20   Cato Mulligan, MD  cyanocobalamin (,VITAMIN B-12,) 1000 MCG/ML injection  01/11/20   [provider]  ferrous sulfate 325 (65 FE) MG tablet Take 1 tablet (325 mg total) by mouth daily with breakfast. 05/13/20   Lacinda Axon, MD  levothyroxine (SYNTHROID) 75 MCG tablet Take 1 tablet (75 mcg total) by mouth daily. 10/20/21 01/18/22  Mapp, Claudia Desanctis, MD  meloxicam (MOBIC) 7.5 MG tablet Take 1 tablet (7.5 mg total) by mouth daily. 02/22/20   Zigmund Gottron, NP  metoprolol tartrate (LOPRESSOR) 25 MG tablet Take 0.5 tablets (12.5 mg total) by mouth 2 (two) times daily. 11/04/21   Croitoru, Mihai, MD  pseudoephedrine-acetaminophen (TYLENOL SINUS) 30-500 MG TABS tablet Take 1 tablet by mouth every 4 (four) hours as needed. Patient not taking: Reported on 10/21/2021    [provider]  Vitamin D, Ergocalciferol, (DRISDOL) 1.25 MG (50000 UNIT) CAPS capsule Take 1 capsule (50,000 Units total) by mouth every 7 (seven) days. 10/12/21   Jearld Lesch A, DO  azelastine (ASTELIN) 0.1 % nasal spray  Place 1 spray into both nostrils 2 (two) times daily. Use in each nostril as directed Patient not taking: Reported on 03/05/2019 12/01/17 12/24/19  Shella Maxim, NP  DULoxetine (CYMBALTA) 30 MG capsule Take 1 capsule (30 mg total) by mouth daily. 12/19/19 12/24/19  Seawell, Jaimie A, DO  gabapentin (NEURONTIN) 300 MG capsule TAKE 1 CAPSULE(300 MG) BY MOUTH TWICE DAILY Patient not taking: Reported on 08/14/2019 05/23/19 12/24/19  Maudie Mercury, MD      Allergies    Bee venom, Bactrim [sulfamethoxazole-trimethoprim], Codeine, and Sulfamethoxazole-trimethoprim    Review of Systems   Review of Systems  All other systems reviewed and are negative.   Physical Exam Updated Vital Signs BP (!) 144/87 (BP Location: Right Arm)   Pulse 74   Temp 97.9 F (36.6 C)   Resp 16   Ht '5\' 6"'$  (1.676 m)   Wt 108.5 kg   LMP 03/31/2013 (Exact Date)   SpO2 100%   BMI 38.61 kg/m  Physical Exam Vitals and nursing note reviewed.  Constitutional:      Appearance: Normal appearance.  HENT:     Head: Normocephalic and atraumatic.  Eyes:     General:        Right eye: No discharge.        Left eye: No discharge.     Conjunctiva/sclera: Conjunctivae normal.  Pulmonary:     Effort: Pulmonary effort is normal.  Skin:    General: Skin is warm and dry.  Findings: No rash.     Comments: 1 cm superficial laceration to the left anterior fourth digit just d proximal to the DIP.  Neurological:     General: No focal deficit present.     Mental Status: She is alert.  Psychiatric:        Mood and Affect: Mood normal.        Behavior: Behavior normal.     ED Results / Procedures / Treatments   Labs (all labs ordered are listed, but only abnormal results are displayed) Labs Reviewed - No data to display  EKG None  Radiology No results found.  Procedures .Marland KitchenLaceration Repair  Date/Time: 12/31/2021 6:42 PM  Performed by: Hendricks Limes, PA-C Authorized by: Hendricks Limes, PA-C   Consent:     Consent obtained:  Verbal   Consent given by:  Patient   Risks, benefits, and alternatives were discussed: yes     Risks discussed:  Infection   Alternatives discussed:  No treatment Universal protocol:    Procedure explained and questions answered to patient or proxy's satisfaction: yes     Relevant documents present and verified: yes     Test results available: yes     Imaging studies available: no     Required blood products, implants, devices, and special equipment available: no     Site/side marked: yes     Immediately prior to procedure, a time out was called: yes     Patient identity confirmed:  Verbally with patient and arm band Anesthesia:    Anesthesia method:  Local infiltration   Local anesthetic:  Lidocaine 1% w/o epi Laceration details:    Location:  Finger   Finger location:  L ring finger   Length (cm):  1   Depth (mm):  2 Pre-procedure details:    Preparation:  Patient was prepped and draped in usual sterile fashion Exploration:    Limited defect created (wound extended): no     Hemostasis achieved with:  Direct pressure   Imaging outcome: foreign body not noted     Wound exploration: wound explored through full range of motion and entire depth of wound visualized     Wound extent: areolar tissue not violated, fascia not violated, no foreign body, no signs of injury, no nerve damage, no tendon damage, no underlying fracture and no vascular damage     Contaminated: no   Treatment:    Area cleansed with:  Povidone-iodine   Amount of cleaning:  Extensive   Debridement:  None   Undermining:  None   Scar revision: no   Skin repair:    Repair method:  Tissue adhesive Approximation:    Approximation:  Close Repair type:    Repair type:  Simple Post-procedure details:    Dressing:  Non-adherent dressing   Procedure completion:  Tolerated well, no immediate complications     Medications Ordered in ED Medications  lidocaine (PF) (XYLOCAINE) 1 % injection 5 mL  (has no administration in time range)  Tdap (BOOSTRIX) injection 0.5 mL (has no administration in time range)    ED Course/ Medical Decision Making/ A&P                           Medical Decision Making [name] is a 51 y.o. female patient who presents to the emergency department today for further evaluation of a fourth anterior left finger laceration.  Is very superficial.  I closed the wound  with Dermabond.  Please see procedure note for further detail.  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  Tdap was updated.  Strict return precautions were discussed.  She safer discharge at this time.   Risk Prescription drug management. Risk Details: I will low suspicion at this time for any underlying ligamentous or muscular injury.  She has full sensation and range of motion and strength to the finger.  She does not meet inpatient criteria and is safe for outpatient follow-up.    Final Clinical Impression(s) / ED Diagnoses Final diagnoses:  Laceration of left ring finger without foreign body without damage to nail, initial encounter    Rx / DC Orders ED Discharge Orders     None         Cherrie Gauze 12/31/21 Wonda Cheng, MD 12/31/21 2311

## 2021-12-31 NOTE — ED Notes (Signed)
Patient verbalizes understanding of discharge instructions. Opportunity for questioning and answers were provided. Patient discharged from ED.  °

## 2021-12-31 NOTE — Discharge Instructions (Signed)
Please keep the finger out of submerged water for several days.  You may wash your hands as normal.  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  Dermabond should fall off in 5 to 7 days.  Ibuprofen or Tylenol for pain.  You may return to the emergency department for any worsening symptoms you might have.  Otherwise, follow-up with your primary care doctor if you have 1.

## 2022-01-01 ENCOUNTER — Telehealth: Payer: Self-pay | Admitting: Licensed Clinical Social Worker

## 2022-01-01 ENCOUNTER — Ambulatory Visit: Payer: Self-pay

## 2022-01-01 NOTE — Telephone Encounter (Signed)
Message from [place] sent at 01/01/2022  2:57 PM EST  Summary: Pt has concerns about how to care for a wound on her hand   Pt was seen at [facility] and requests call back to advise of how to care for the wound on her hand. Pt stated she forgot to ask a few questions. Cb# [phone]           Reason for Disposition  Minor cut or scratch  Answer Assessment - Initial Assessment Questions 1. APPEARANCE of INJURY: "What does the injury look like?"      Site is "a little swollen" no redness 2. SIZE: "How large is the cut?"      1 cm 3. BLEEDING: "Is it bleeding now?" If Yes, ask: "Is it difficult to stop?"      no 4. LOCATION: "Where is the injury located?"      Left ring finger 5. ONSET: "How long ago did the injury occur?"      yesterday 6. MECHANISM: "Tell me how it happened."      Cut herself with sharp stying scissors 7. TETANUS: "When was the last tetanus booster?"     In ED  8. PREGNANCY: "Is there any chance you are pregnant?" "When was your last menstrual period?"     N/a  Protocols used: Cuts and Lacerations-A-AH

## 2022-01-01 NOTE — Patient Outreach (Signed)
Transition Care Management Follow-up Telephone Call Date of discharge and from where: 12/31/21 from [place] ED How have you been since you were released from the hospital? I've been doing a lot better Any questions or concerns? No  Items Reviewed: Did the pt receive and understand the discharge instructions provided? Yes  Medications obtained and verified? Yes  Other? No  Any new allergies since your discharge? No  Dietary orders reviewed? No Do you have support at home? Yes   Home Care and Equipment/Supplies: Were home health services ordered? no If so, what is the name of the agency? na  Has the agency set up a time to come to the patient's home? not applicable Were any new equipment or medical supplies ordered?  No What is the name of the medical supply agency? na Were you able to get the supplies/equipment? not applicable Do you have any questions related to the use of the equipment or supplies? na  Functional Questionnaire: (I = Independent and D = Dependent) ADLs: I  Bathing/Dressing- I  Meal Prep- I  Eating- I  Maintaining continence- I  Transferring/Ambulation- I  Managing Meds- I  Follow up appointments reviewed:  PCP Hospital f/u appt confirmed? ED visit only [facility] f/u appt confirmed? No   Are transportation arrangements needed? No  If their condition worsens, is the pt aware to call PCP or go to the Emergency Dept.? Yes Was the patient provided with contact information for the PCP's office or ED? Yes Was to pt encouraged to call back with questions or concerns? Yes  SDOH assessments and interventions completed:   Yes

## 2022-01-01 NOTE — Patient Outreach (Signed)
Transition Care Management Unsuccessful Follow-up Telephone Call  Date of discharge and from where:  12/31/21 from [facility] ED  Attempts:  1st Attempt  Reason for unsuccessful TCM follow-up call:  Left voice message  Eula Fried, BSW, MSW, [employer] Managed [employer] [email or web address] Phone: [phone]

## 2022-01-15 ENCOUNTER — Other Ambulatory Visit: Payer: Self-pay

## 2022-01-19 ENCOUNTER — Encounter: Payer: Self-pay | Admitting: Gastroenterology

## 2022-01-19 NOTE — Telephone Encounter (Signed)
Pt's calling and stating she's currently out of Levothyroxine and needs a refill today. Thanks

## 2022-01-26 ENCOUNTER — Ambulatory Visit: Payer: Medicaid Other | Admitting: Bariatrics

## 2022-01-29 ENCOUNTER — Other Ambulatory Visit: Payer: Self-pay

## 2022-01-29 ENCOUNTER — Emergency Department (HOSPITAL_BASED_OUTPATIENT_CLINIC_OR_DEPARTMENT_OTHER)
Admission: EM | Admit: 2022-01-29 | Discharge: 2022-01-29 | Disposition: A | Discharge status: Home / Self Care | Payer: Medicaid Other | Attending: Emergency Medicine | Admitting: Emergency Medicine

## 2022-01-29 ENCOUNTER — Encounter (HOSPITAL_BASED_OUTPATIENT_CLINIC_OR_DEPARTMENT_OTHER): Payer: Self-pay | Admitting: Radiology

## 2022-01-29 DIAGNOSIS — G43809 Other migraine, not intractable, without status migrainosus: Secondary | ICD-10-CM | POA: Diagnosis not present

## 2022-01-29 DIAGNOSIS — R519 Headache, unspecified: Secondary | ICD-10-CM | POA: Diagnosis present

## 2022-01-29 DIAGNOSIS — Z79899 Other long term (current) drug therapy: Secondary | ICD-10-CM | POA: Insufficient documentation

## 2022-01-29 MED ORDER — KETOROLAC TROMETHAMINE 15 MG/ML IJ SOLN
15.0000 mg | Freq: Once | INTRAMUSCULAR | Status: AC
  Administered 2022-01-29: 15 mg via INTRAVENOUS
  Filled 2022-01-29: qty 1

## 2022-01-29 MED ORDER — METOCLOPRAMIDE HCL 5 MG/ML IJ SOLN
10.0000 mg | Freq: Once | INTRAMUSCULAR | Status: AC
  Administered 2022-01-29: 10 mg via INTRAVENOUS
  Filled 2022-01-29: qty 2

## 2022-01-29 MED ORDER — SODIUM CHLORIDE 0.9 % IV BOLUS
1000.0000 mL | Freq: Once | INTRAVENOUS | Status: AC
  Administered 2022-01-29: 1000 mL via INTRAVENOUS

## 2022-01-29 MED ORDER — DEXAMETHASONE SODIUM PHOSPHATE 10 MG/ML IJ SOLN
10.0000 mg | Freq: Once | INTRAMUSCULAR | Status: AC
  Administered 2022-01-29: 10 mg via INTRAVENOUS
  Filled 2022-01-29: qty 1

## 2022-01-29 MED ORDER — DIPHENHYDRAMINE HCL 50 MG/ML IJ SOLN
12.5000 mg | Freq: Once | INTRAMUSCULAR | Status: AC
  Administered 2022-01-29: 12.5 mg via INTRAVENOUS
  Filled 2022-01-29: qty 1

## 2022-01-29 NOTE — ED Triage Notes (Signed)
Pt states she has been having a migraine since Monday. She has been taking Excedrin migraine but it isn't helping anymore.

## 2022-01-29 NOTE — ED Provider Notes (Signed)
[facility] Provider Note   CSN: [id number] Arrival date & time: 01/29/22  1720     History  Chief Complaint  Patient presents with   Migraine    [name] is a 52 y.o. female presenting to emergency department complaint of headache.  Patient suffers from chronic migraines that she had a recurrence of her migraine in her typical presentation about 4 days ago.  She has been taking Excedrin with some brief relief but it is not going away.  No new symptoms from normal.  She has not seen a neurologist in several months or years per her report.  HPI     Home Medications Prior to Admission medications   Medication Sig Start Date End Date Taking? Authorizing Provider  acetaminophen (TYLENOL) 500 MG tablet Take 2 tablets (1,000 mg total) by mouth every 6 (six) hours as needed for moderate pain or headache. 02/25/20   Cato Mulligan, MD  cyanocobalamin (,VITAMIN B-12,) 1000 MCG/ML injection  01/11/20   [provider]  ferrous sulfate 325 (65 FE) MG tablet Take 1 tablet (325 mg total) by mouth daily with breakfast. 05/13/20   Lacinda Axon, MD  levothyroxine (SYNTHROID) 75 MCG tablet TAKE 1 TABLET(75 MCG) BY MOUTH DAILY 01/19/22   Velna Ochs, MD  meloxicam (MOBIC) 7.5 MG tablet Take 1 tablet (7.5 mg total) by mouth daily. 02/22/20   Zigmund Gottron, NP  metoprolol tartrate (LOPRESSOR) 25 MG tablet Take 0.5 tablets (12.5 mg total) by mouth 2 (two) times daily. 11/04/21   Croitoru, Mihai, MD  pseudoephedrine-acetaminophen (TYLENOL SINUS) 30-500 MG TABS tablet Take 1 tablet by mouth every 4 (four) hours as needed. Patient not taking: Reported on 10/21/2021    [provider]  Vitamin D, Ergocalciferol, (DRISDOL) 1.25 MG (50000 UNIT) CAPS capsule Take 1 capsule (50,000 Units total) by mouth every 7 (seven) days. 10/12/21   Jearld Lesch A, DO  azelastine (ASTELIN) 0.1 % nasal spray Place 1 spray into both nostrils 2 (two) times daily. Use  in each nostril as directed Patient not taking: Reported on 03/05/2019 12/01/17 12/24/19  Shella Maxim, NP  DULoxetine (CYMBALTA) 30 MG capsule Take 1 capsule (30 mg total) by mouth daily. 12/19/19 12/24/19  Seawell, Jaimie A, DO  gabapentin (NEURONTIN) 300 MG capsule TAKE 1 CAPSULE(300 MG) BY MOUTH TWICE DAILY Patient not taking: Reported on 08/14/2019 05/23/19 12/24/19  Maudie Mercury, MD      Allergies    Bee venom, Bactrim [sulfamethoxazole-trimethoprim], Codeine, and Sulfamethoxazole-trimethoprim    Review of Systems   Review of Systems  Physical Exam Updated Vital Signs BP (!) 148/78 (BP Location: Right Arm)   Pulse 83   Temp 97.8 F (36.6 C)   Resp 17   Ht '5\' 6"'$  (1.676 m)   Wt 108.9 kg   LMP 03/31/2013 (Exact Date)   SpO2 99%   BMI 38.74 kg/m  Physical Exam Constitutional:      General: She is not in acute distress. HENT:     Head: Normocephalic and atraumatic.  Eyes:     Conjunctiva/sclera: Conjunctivae normal.     Pupils: Pupils are equal, round, and reactive to light.  Cardiovascular:     Rate and Rhythm: Normal rate and regular rhythm.  Pulmonary:     Effort: Pulmonary effort is normal. No respiratory distress.  Abdominal:     General: There is no distension.     Tenderness: There is no abdominal tenderness.  Skin:    General: Skin is  warm and dry.  Neurological:     General: No focal deficit present.     Mental Status: She is alert and oriented to person, place, and time. Mental status is at baseline.     Cranial Nerves: No cranial nerve deficit.     Sensory: No sensory deficit.     Motor: No weakness.  Psychiatric:        Mood and Affect: Mood normal.        Behavior: Behavior normal.     ED Results / Procedures / Treatments   Labs (all labs ordered are listed, but only abnormal results are displayed) Labs Reviewed - No data to display  EKG None  Radiology No results found.  Procedures Procedures    Medications Ordered in ED Medications   ketorolac (TORADOL) 15 MG/ML injection 15 mg (15 mg Intravenous Given 01/29/22 1917)  metoCLOPramide (REGLAN) injection 10 mg (10 mg Intravenous Given 01/29/22 1918)  dexamethasone (DECADRON) injection 10 mg (10 mg Intravenous Given 01/29/22 1918)  sodium chloride 0.9 % bolus 1,000 mL (1,000 mLs Intravenous New Bag/Given 01/29/22 1917)  diphenhydrAMINE (BENADRYL) injection 12.5 mg (12.5 mg Intravenous Given 01/29/22 1918)    ED Course/ Medical Decision Making/ A&P Clinical Course as of 01/29/22 2059  Fri Jan 29, 2022  2044 Patient's migraine has completely resolved and she is ready to go home.  She has neurology to follow-up with already. [MT]    Clinical Course User Index [MT] Camp Gopal, Carola Rhine, MD                              This patient presents to the Emergency Department with complaint of headache.  This involves an extensive number of treatment options, and is a complaint that carries with it a high risk of complications and morbidity.  The differential diagnosis for headache includes tension type headache vs occipital headache vs migraine vs sinusitis vs intracranial bleed vs other  Clinically I am most suspicious that this is consistent with the patient's chronic migraines.  It is the same presentation as her typical headaches.  She has no new or concerning or logical deficits to raise concern for meningitis, subarachnoid hemorrhage, ruptured aneurysm, or thrombosis, or other life-threatening condition.  I ordered medication  for headache and/or nausea  I do not see an indication for emergent neuroimaging of the brain at this time.  After the interventions stated above, I reevaluated the patient and found that the patient remained clinically stable.  Based on the patient's clinical exam, vital signs, risk factors, and ED testing, I felt that the patient's overall risk of life-threatening emergency such as ICH, meningitis, intracranial mass or tumor was quite low.  I suspect this  clinical presentation is most consistent with migraine headache, but explained to the patient that this evaluation was not a definitive diagnostic workup.  I discussed outpatient follow up with primary care provider, and provided specialist office number on the patient's discharge paper if a referral was deemed necessary.  I discussed return precautions with the patient. I felt the patient was clinically stable for discharge.         Final Clinical Impression(s) / ED Diagnoses Final diagnoses:  Other migraine without status migrainosus, not intractable    Rx / DC Orders ED Discharge Orders     None         Wyvonnia Dusky, MD 01/29/22 2059

## 2022-02-01 ENCOUNTER — Ambulatory Visit: Payer: Medicaid Other | Admitting: Bariatrics

## 2022-02-02 ENCOUNTER — Ambulatory Visit (AMBULATORY_SURGERY_CENTER): Payer: Medicaid Other

## 2022-02-02 VITALS — Ht 66.0 in | Wt 234.0 lb

## 2022-02-02 DIAGNOSIS — Z1211 Encounter for screening for malignant neoplasm of colon: Secondary | ICD-10-CM

## 2022-02-02 MED ORDER — PLENVU 140 G PO SOLR: 1.0000 | Freq: Once | ORAL | 0 refills | Status: AC

## 2022-02-02 NOTE — Progress Notes (Signed)

## 2022-02-04 ENCOUNTER — Ambulatory Visit: Payer: Medicaid Other | Admitting: Bariatrics

## 2022-02-08 ENCOUNTER — Ambulatory Visit: Payer: Medicaid Other | Admitting: Nurse Practitioner

## 2022-02-08 ENCOUNTER — Other Ambulatory Visit: Payer: Self-pay | Admitting: Nurse Practitioner

## 2022-02-08 ENCOUNTER — Encounter: Payer: Self-pay | Admitting: Nurse Practitioner

## 2022-02-08 VITALS — BP 149/86 | HR 65 | Temp 98.6°F | Ht 66.0 in | Wt 237.0 lb

## 2022-02-08 DIAGNOSIS — I10 Essential (primary) hypertension: Secondary | ICD-10-CM

## 2022-02-08 DIAGNOSIS — R638 Other symptoms and signs concerning food and fluid intake: Secondary | ICD-10-CM | POA: Diagnosis not present

## 2022-02-08 DIAGNOSIS — E669 Obesity, unspecified: Secondary | ICD-10-CM

## 2022-02-08 DIAGNOSIS — E559 Vitamin D deficiency, unspecified: Secondary | ICD-10-CM

## 2022-02-08 DIAGNOSIS — Z6838 Body mass index (BMI) 38.0-38.9, adult: Secondary | ICD-10-CM | POA: Diagnosis not present

## 2022-02-08 MED ORDER — TOPIRAMATE 50 MG PO TABS: ORAL_TABLET | ORAL | 0 refills | Status: DC

## 2022-02-08 MED ORDER — VITAMIN D (ERGOCALCIFEROL) 1.25 MG (50000 UNIT) PO CAPS: 50000.0000 [IU] | ORAL_CAPSULE | ORAL | 0 refills | Status: DC

## 2022-02-15 NOTE — Progress Notes (Unsigned)
Chief Complaint:   OBESITY [name] is here to discuss her progress with her obesity treatment plan along with follow-up of her obesity related diagnoses. [name] is on keeping a food journal and adhering to recommended goals of 1000 calories and ? protein and states she is following her eating plan approximately 85% of the time. [name] states she is walking 30-45 minutes 2 times per week.  Today's visit was #: 5 Starting weight: 250 lbs Starting date: 07/22/2021 Today's weight: 237 lbs Today's date: 02/08/2022 Total lbs lost to date: 13 lbs Total lbs lost since last in-office visit: 0  Interim History: [name] was seen here last on 10/12/2021.  Notes got off track over the holidays.  Started following Category 1 meal plan 2 weeks ago.  She has been trying to eat more fruits and vegetables.  Drinking water daily.  Denies sugary drinks.  Subjective:   1. Vitamin D deficiency [name] is not currently taking Vit D.  Took in the past and felt overall better while taking it.  Last Vit D was 22 on 11/26/21.  2. Hypertension, unspecified type Pepper is taking Lopressor once daily.  Not taking twice a day on a regular basis.  Denies any chest pain,shortness of breath or palpitations.  Struggling with migraines.  Saw Neurology today.  3. Abnormal craving Myrtha is struggling with cravings.  Assessment/Plan:   1. Vitamin D deficiency We will refill Vit D 50K IU once a week for 1 month with 0 refills.  Denies any side effects.  -Refill Vitamin D, Ergocalciferol, (DRISDOL) 1.25 MG (50000 UNIT) CAPS capsule; Take 1 capsule (50,000 Units total) by mouth every 7 (seven) days.  Dispense: 5 capsule; Refill: 0  2. Hypertension, unspecified type Take as directed.  3. Abnormal craving Start Topamax 50 mg/ take 25 mg daily for 1 week then increase to full pill for 1 month with 0 refills.  Side effects discussed.   -Start topiramate (TOPAMAX) 50 MG tablet; Take a 1/2 tablet po daily for one week and  then increase to a full pill  Dispense: 30 tablet; Refill: 0  4. Obesity, Current BMI 38.3 [name] is currently in the action stage of change. As such, her goal is to continue with weight loss efforts. She has agreed to the Category 1 Plan.   Will consider repeating IC in the next 2 to 6 months.  Exercise goals: All adults should avoid inactivity. Some physical activity is better than none, and adults who participate in any amount of physical activity gain some health benefits.  Behavioral modification strategies: increasing lean protein intake, increasing vegetables, and increasing water intake.  Lusero has agreed to follow-up with our clinic in 4 weeks. She was informed of the importance of frequent follow-up visits to maximize her success with intensive lifestyle modifications for her multiple health conditions.   Objective:   Blood pressure (!) 149/86, pulse 65, temperature 98.6 F (37 C), height '5\' 6"'$  (1.676 m), last menstrual period 03/31/2013, SpO2 100 %. Body mass index is 37.77 kg/m.  General: Cooperative, alert, well developed, in no acute distress. HEENT: Conjunctivae and lids unremarkable. Cardiovascular: Regular rhythm.  Lungs: Normal work of breathing. Neurologic: No focal deficits.   Lab Results  Component Value Date   CREATININE 0.75 10/03/2021   BUN 15 10/03/2021   NA 138 10/03/2021   K 4.0 10/03/2021   CL 104 10/03/2021   CO2 21 (L) 10/03/2021   Lab Results  Component Value Date   ALT 14  07/22/2021   AST 14 07/22/2021   ALKPHOS 68 07/22/2021   BILITOT 0.6 07/22/2021   Lab Results  Component Value Date   HGBA1C 5.6 07/22/2021   HGBA1C 5.6 02/04/2021   HGBA1C 5.3 11/07/2018   Lab Results  Component Value Date   INSULIN 15.8 07/22/2021   Lab Results  Component Value Date   TSH 3.070 11/26/2021   Lab Results  Component Value Date   CHOL 248 (H) 07/22/2021   HDL 57 07/22/2021   LDLCALC 173 (H) 07/22/2021   TRIG 104 07/22/2021   Lab Results   Component Value Date   VD25OH 22.0 (L) 11/26/2021   VD25OH 13.0 (L) 07/22/2021   VD25OH 19.6 (L) 02/04/2021   Lab Results  Component Value Date   WBC 10.8 (H) 10/03/2021   HGB 13.6 10/03/2021   HCT 39.6 10/03/2021   MCV 79.0 (L) 10/03/2021   PLT 367 10/03/2021   Lab Results  Component Value Date   IRON 75 10/20/2021   TIBC 372 10/20/2021   FERRITIN 16 10/20/2021   Attestation Statements:   Reviewed by clinician on day of visit: allergies, medications, problem list, medical history, surgical history, family history, social history, and previous encounter notes.  I, Brendell Tyus, RMA, am acting as transcriptionist for Everardo Pacific, FNP.  I have reviewed the above documentation for accuracy and completeness, and I agree with the above. -  ***

## 2022-02-22 NOTE — Progress Notes (Deleted)
CC: 3 month f/u   HPI:  [name] is a 52 y.o. female with past medical history of Hashimoto's thyroiditis, migraine, pernicious anemia, lateral epicondylitis, chronic pelvic pain, obesity, and anxiety that presents for a 3 month f/u visit.    Allergies as of 02/22/2022       Reactions   Bee Venom Anaphylaxis   Bactrim [sulfamethoxazole-trimethoprim] Itching   Codeine Nausea And Vomiting   Sulfamethoxazole-trimethoprim Itching        Medication List        Accurate as of February 22, 2022  7:37 AM. If you have any questions, ask your nurse or doctor.          acetaminophen 500 MG tablet Commonly known as: TYLENOL Take 2 tablets (1,000 mg total) by mouth every 6 (six) hours as needed for moderate pain or headache.   cyanocobalamin 1000 MCG tablet Take 1,000 mcg by mouth daily.   cyanocobalamin 1000 MCG/ML injection Commonly known as: VITAMIN B12   ferrous sulfate 325 (65 FE) MG tablet Take 1 tablet (325 mg total) by mouth daily with breakfast.   levothyroxine 75 MCG tablet Commonly known as: SYNTHROID TAKE 1 TABLET(75 MCG) BY MOUTH DAILY   meloxicam 7.5 MG tablet Commonly known as: Mobic Take 1 tablet (7.5 mg total) by mouth daily.   metoprolol tartrate 25 MG tablet Commonly known as: LOPRESSOR Take 0.5 tablets (12.5 mg total) by mouth 2 (two) times daily.   topiramate 50 MG tablet Commonly known as: Topamax Take a 1/2 tablet po daily for one week and then increase to a full pill   Vitamin D (Ergocalciferol) 1.25 MG (50000 UNIT) Caps capsule Commonly known as: DRISDOL Take 1 capsule (50,000 Units total) by mouth every 7 (seven) days.         Past Medical History:  Diagnosis Date   Arthritis    B12 deficiency    Back pain    Elevated blood pressure reading    Fibroids, submucosal    History of blood transfusion 02/19/2011   [facility] - ? 2-3 units transfused   Hypothyroidism    Iron deficiency anemia    Vitamin D deficiency    Review of  Systems:  per HPI.   Physical Exam: *** There were no vitals filed for this visit.  *** Constitutional: Well-developed, well-nourished, appears comfortable  HENT: Normocephalic and atraumatic.  Eyes: EOM are normal. PERRL.  Neck: Normal range of motion.  Cardiovascular: Regular rate, regular rhythm. No murmurs, rubs, or gallops. Normal radial and PT pulses bilaterally. No LE edema.  Pulmonary: Normal respiratory effort. No wheezes, rales, or rhonchi.   Abdominal: Soft. Non-distended. No tenderness. Normal bowel sounds.  Musculoskeletal: Normal range of motion.     Neurological: Alert and oriented to person, place, and time. Non-focal. Skin: warm and dry.    Assessment & Plan:   Hashimotos Thyroiditis  Patient has a history of Hashimotos Thyroiditis. Current medications include levothyroxine 75 mcg daily. Patient reports that they are *** compliant with this medication. Patient denies fatigue, constipation, weight gain, or dry skin***. Most recent TSH was WNL 2 months ago.   Plan: - Continue levothyroxine 75 mcg daily  2. Pernicious anemia Patient has a history of pernicious anemia. Previously received B12 injections (1000 mcg weekly) but was transitioned to oral supplementation. Vitamin B12 level was WNL 2 months ago.   Plan: - Continue oral supplementation  3. - Current medications include  Plan: - Continue  4. Health Screening: - colonoscopy () -  Medication refill?     See Encounters Tab for problem based charting.  Patient seen with Dr. Barbaraann Boys

## 2022-02-23 ENCOUNTER — Encounter: Payer: Self-pay | Admitting: Gastroenterology

## 2022-02-23 ENCOUNTER — Ambulatory Visit (AMBULATORY_SURGERY_CENTER): Payer: Medicaid Other | Admitting: Gastroenterology

## 2022-02-23 VITALS — BP 103/71 | HR 80 | Temp 98.0°F | Resp 14 | Ht 66.0 in | Wt 236.0 lb

## 2022-02-23 DIAGNOSIS — Z1211 Encounter for screening for malignant neoplasm of colon: Secondary | ICD-10-CM

## 2022-02-23 DIAGNOSIS — D128 Benign neoplasm of rectum: Secondary | ICD-10-CM | POA: Diagnosis not present

## 2022-02-23 DIAGNOSIS — D120 Benign neoplasm of cecum: Secondary | ICD-10-CM

## 2022-02-23 DIAGNOSIS — D124 Benign neoplasm of descending colon: Secondary | ICD-10-CM

## 2022-02-23 MED ORDER — SODIUM CHLORIDE 0.9 % IV SOLN: 500.0000 mL | Freq: Once | INTRAVENOUS | Status: DC

## 2022-02-23 NOTE — Progress Notes (Signed)
Called to room to assist during endoscopic procedure.  Patient ID and intended procedure confirmed with present staff. Received instructions for my participation in the procedure from the performing physician.  

## 2022-02-23 NOTE — Patient Instructions (Signed)
Repeat colonoscopy after pathology results. Handouts given on hemorrhoids & colon polyps.  YOU HAD AN ENDOSCOPIC PROCEDURE TODAY AT [facility]:   Refer to the procedure report that was given to you for any specific questions about what was found during the examination.  If the procedure report does not answer your questions, please call your gastroenterologist to clarify.  If you requested that your care partner not be given the details of your procedure findings, then the procedure report has been included in a sealed envelope for you to review at your convenience later.  YOU SHOULD EXPECT: Some feelings of bloating in the abdomen. Passage of more gas than usual.  Walking can help get rid of the air that was put into your GI tract during the procedure and reduce the bloating. If you had a lower endoscopy (such as a colonoscopy or flexible sigmoidoscopy) you may notice spotting of blood in your stool or on the toilet paper. If you underwent a bowel prep for your procedure, you may not have a normal bowel movement for a few days.  Please Note:  You might notice some irritation and congestion in your nose or some drainage.  This is from the oxygen used during your procedure.  There is no need for concern and it should clear up in a day or so.  SYMPTOMS TO REPORT IMMEDIATELY:  Following lower endoscopy (colonoscopy or flexible sigmoidoscopy):  Excessive amounts of blood in the stool  Significant tenderness or worsening of abdominal pains  Swelling of the abdomen that is new, acute  Fever of 100F or higher  For urgent or emergent issues, a gastroenterologist can be reached at any hour by calling [phone]. Do not use MyChart messaging for urgent concerns.    DIET:  We do recommend a small meal at first, but then you may proceed to your regular diet.  Drink plenty of fluids but you should avoid alcoholic beverages for 24 hours.  ACTIVITY:  You should plan to take it easy for  the rest of today and you should NOT DRIVE or use heavy machinery until tomorrow (because of the sedation medicines used during the test).    FOLLOW UP: Our staff will call the number listed on your records the next business day following your procedure.  We will call around 7:15- 8:00 am to check on you and address any questions or concerns that you may have regarding the information given to you following your procedure. If we do not reach you, we will leave a message.     If any biopsies were taken you will be contacted by phone or by letter within the next 1-3 weeks.  Please call us at [phone] if you have not heard about the biopsies in 3 weeks.    SIGNATURES/CONFIDENTIALITY: You and/or your care partner have signed paperwork which will be entered into your electronic medical record.  These signatures attest to the fact that that the information above on your After Visit Summary has been reviewed and is understood.  Full responsibility of the confidentiality of this discharge information lies with you and/or your care-partner.

## 2022-02-23 NOTE — Progress Notes (Signed)
See 02/08/2022 H&P no changes

## 2022-02-23 NOTE — Op Note (Signed)
[facility] Patient Name: [name] Procedure Date: 02/23/2022 9:24 AM MRN: [id number] Endoscopist: Ladene Artist , MD, [id number] Age: 52 Referring MD:  Date of Birth: 07-28-1970 Gender: Female Account #: [id number] Procedure:                Colonoscopy Indications:              Screening for colorectal malignant neoplasm Medicines:                Monitored Anesthesia Care Procedure:                Pre-Anesthesia Assessment:                           - Prior to the procedure, a History and Physical                            was performed, and patient medications and                            allergies were reviewed. The patient's tolerance of                            previous anesthesia was also reviewed. The risks                            and benefits of the procedure and the sedation                            options and risks were discussed with the patient.                            All questions were answered, and informed consent                            was obtained. Prior Anticoagulants: The patient has                            taken no anticoagulant or antiplatelet agents. ASA                            Grade Assessment: II - A patient with mild systemic                            disease. After reviewing the risks and benefits,                            the patient was deemed in satisfactory condition to                            undergo the procedure.                           After obtaining informed consent, the colonoscope  was passed under direct vision. Throughout the                            procedure, the patient's blood pressure, pulse, and                            oxygen saturations were monitored continuously. The                            CF [id number] #[id number] was introduced through the anus                            and advanced to the the cecum, identified by                            appendiceal  orifice and ileocecal valve. The                            ileocecal valve, appendiceal orifice, and rectum                            were photographed. The quality of the bowel                            preparation was good. The colonoscopy was performed                            without difficulty. The patient tolerated the                            procedure well. Scope In: 9:29:48 AM Scope Out: 9:43:57 AM Scope Withdrawal Time: 0 hours 12 minutes 1 second  Total Procedure Duration: 0 hours 14 minutes 9 seconds  Findings:                 The perianal and digital rectal examinations were                            normal.                           A 7 mm polyp was found in the appendiceal orifice.                            The polyp was sessile. The polyp was removed with a                            hot snare. Resection and retrieval were complete.                           Two sessile polyps were found in the rectum and                            descending colon. The polyps were 5 to 7 mm in  size. These polyps were removed with a cold snare.                            Resection and retrieval were complete.                           Internal hemorrhoids were found during                            retroflexion. The hemorrhoids were small and Grade                            I (internal hemorrhoids that do not prolapse).                           The exam was otherwise without abnormality on                            direct and retroflexion views. Complications:            No immediate complications. Estimated blood loss:                            None. Estimated Blood Loss:     Estimated blood loss: none. Impression:               - One 7 mm polyp at the appendiceal orifice,                            removed with a hot snare. Resected and retrieved.                           - Two 5 to 7 mm polyps in the rectum and in the                             descending colon, removed with a cold snare.                            Resected and retrieved.                           - Internal hemorrhoids.                           - The examination was otherwise normal on direct                            and retroflexion views. Recommendation:           - Repeat colonoscopy after studies are complete for                            surveillance based on pathology results.                           - Patient has a contact number  available for                            emergencies. The signs and symptoms of potential                            delayed complications were discussed with the                            patient. Return to normal activities tomorrow.                            Written discharge instructions were provided to the                            patient.                           - Resume previous diet.                           - Continue present medications.                           - Await pathology results. Ladene Artist, MD 02/23/2022 9:47:02 AM This report has been signed electronically.

## 2022-02-23 NOTE — Progress Notes (Signed)
Pt's states no medical or surgical changes since previsit or office visit. 

## 2022-02-23 NOTE — Progress Notes (Signed)
[facility]ss nAD TRANS TO PACU

## 2022-02-24 ENCOUNTER — Telehealth: Payer: Self-pay | Admitting: *Deleted

## 2022-02-24 NOTE — Telephone Encounter (Signed)
  Follow up Call-     02/23/2022    9:11 AM  Call back number  Post procedure Call Back phone  # [phone]  Permission to leave phone message Yes     Patient questions:  Do you have a fever, pain , or abdominal swelling? No. Pain Score  0 *  Have you tolerated food without any problems? Yes.    Have you been able to return to your normal activities? Yes.    Do you have any questions about your discharge instructions: Diet   No. Medications  No. Follow up visit  No.  Do you have questions or concerns about your Care? No.  Actions: * If pain score is 4 or above: No action needed, pain <4.

## 2022-03-02 ENCOUNTER — Other Ambulatory Visit: Payer: Self-pay | Admitting: Nurse Practitioner

## 2022-03-02 DIAGNOSIS — R638 Other symptoms and signs concerning food and fluid intake: Secondary | ICD-10-CM

## 2022-03-03 ENCOUNTER — Other Ambulatory Visit: Payer: Self-pay | Admitting: Nurse Practitioner

## 2022-03-03 DIAGNOSIS — R638 Other symptoms and signs concerning food and fluid intake: Secondary | ICD-10-CM

## 2022-03-04 ENCOUNTER — Encounter: Payer: Self-pay | Admitting: Gastroenterology

## 2022-03-13 ENCOUNTER — Other Ambulatory Visit: Payer: Self-pay | Admitting: Nurse Practitioner

## 2022-03-13 DIAGNOSIS — E559 Vitamin D deficiency, unspecified: Secondary | ICD-10-CM

## 2022-03-15 ENCOUNTER — Ambulatory Visit: Payer: Medicaid Other | Admitting: Bariatrics

## 2022-03-22 ENCOUNTER — Ambulatory Visit: Payer: Medicaid Other | Admitting: Bariatrics

## 2022-05-11 DIAGNOSIS — S93491A Sprain of other ligament of right ankle, initial encounter: Secondary | ICD-10-CM | POA: Diagnosis not present

## 2022-05-11 DIAGNOSIS — M7671 Peroneal tendinitis, right leg: Secondary | ICD-10-CM | POA: Diagnosis not present

## 2022-05-11 DIAGNOSIS — M722 Plantar fascial fibromatosis: Secondary | ICD-10-CM | POA: Diagnosis not present

## 2022-06-07 DIAGNOSIS — M25571 Pain in right ankle and joints of right foot: Secondary | ICD-10-CM | POA: Diagnosis not present

## 2022-06-15 DIAGNOSIS — S93491A Sprain of other ligament of right ankle, initial encounter: Secondary | ICD-10-CM | POA: Diagnosis not present

## 2022-07-12 DIAGNOSIS — M79671 Pain in right foot: Secondary | ICD-10-CM | POA: Diagnosis not present

## 2022-07-12 DIAGNOSIS — M25571 Pain in right ankle and joints of right foot: Secondary | ICD-10-CM | POA: Diagnosis not present

## 2022-07-12 DIAGNOSIS — M722 Plantar fascial fibromatosis: Secondary | ICD-10-CM | POA: Diagnosis not present

## 2022-07-12 DIAGNOSIS — M25572 Pain in left ankle and joints of left foot: Secondary | ICD-10-CM | POA: Diagnosis not present

## 2022-07-26 ENCOUNTER — Encounter: Payer: Medicaid Other | Admitting: Student

## 2022-08-03 ENCOUNTER — Ambulatory Visit (INDEPENDENT_AMBULATORY_CARE_PROVIDER_SITE_OTHER): Payer: Medicaid Other | Admitting: Student

## 2022-08-03 ENCOUNTER — Encounter: Payer: Self-pay | Admitting: Student

## 2022-08-03 VITALS — BP 133/78 | HR 66 | Temp 98.4°F | Ht 65.0 in | Wt 246.6 lb

## 2022-08-03 DIAGNOSIS — M79671 Pain in right foot: Secondary | ICD-10-CM | POA: Insufficient documentation

## 2022-08-03 DIAGNOSIS — R000 Tachycardia, unspecified: Secondary | ICD-10-CM | POA: Diagnosis not present

## 2022-08-03 DIAGNOSIS — L819 Disorder of pigmentation, unspecified: Secondary | ICD-10-CM

## 2022-08-03 NOTE — Progress Notes (Signed)
CC: white spots on R foot  HPI:  [name] is a 52 y.o. female living with a history stated below and presents today for white spots on R foot. Please see problem based assessment and plan for additional details.  Past Medical History:  Diagnosis Date   Arthritis    B12 deficiency    Back pain    Elevated blood pressure reading    Fibroids, submucosal    History of blood transfusion 02/19/2011   [facility] - ? 2-3 units transfused   Hypothyroidism    Iron deficiency anemia    Vitamin D deficiency     Current Outpatient Medications on File Prior to Visit  Medication Sig Dispense Refill   acetaminophen (TYLENOL) 500 MG tablet Take 2 tablets (1,000 mg total) by mouth every 6 (six) hours as needed for moderate pain or headache. 30 tablet 0   cyanocobalamin (,VITAMIN B-12,) 1000 MCG/ML injection  (Patient not taking: Reported on 02/02/2022)     cyanocobalamin 1000 MCG tablet Take 1,000 mcg by mouth daily.     ferrous sulfate 325 (65 FE) MG tablet Take 1 tablet (325 mg total) by mouth daily with breakfast. (Patient not taking: Reported on 02/02/2022) 90 tablet 1   levothyroxine (SYNTHROID) 75 MCG tablet TAKE 1 TABLET(75 MCG) BY MOUTH DAILY 30 tablet 11   meloxicam (MOBIC) 7.5 MG tablet Take 1 tablet (7.5 mg total) by mouth daily. (Patient not taking: Reported on 02/02/2022) 30 tablet 0   metoprolol tartrate (LOPRESSOR) 25 MG tablet Take 0.5 tablets (12.5 mg total) by mouth 2 (two) times daily. 90 tablet 3   topiramate (TOPAMAX) 50 MG tablet Take 1 tablet daily 90 tablet 0   Vitamin D, Ergocalciferol, (DRISDOL) 1.25 MG (50000 UNIT) CAPS capsule Take 1 capsule (50,000 Units total) by mouth every 7 (seven) days. 5 capsule 0   [DISCONTINUED] azelastine (ASTELIN) 0.1 % nasal spray Place 1 spray into both nostrils 2 (two) times daily. Use in each nostril as directed (Patient not taking: Reported on 03/05/2019) 30 mL 0   [DISCONTINUED] DULoxetine (CYMBALTA) 30 MG capsule Take 1 capsule (30 mg  total) by mouth daily. 30 capsule 2   [DISCONTINUED] gabapentin (NEURONTIN) 300 MG capsule TAKE 1 CAPSULE(300 MG) BY MOUTH TWICE DAILY (Patient not taking: Reported on 08/14/2019) 60 capsule 5   No current facility-administered medications on file prior to visit.   Review of Systems: ROS negative except for what is noted on the assessment and plan.  Vitals:   08/03/22 0906 08/03/22 1010  BP: (!) 141/81 133/78  Pulse: 63 66  Temp: 98.4 F (36.9 C)   TempSrc: Oral   SpO2: 99%   Weight: 246 lb 9.6 oz (111.9 kg)   Height: 5\' 5"  (1.651 m)    Physical Exam: Constitutional: well-appearing female sitting in chair comfortably, in no acute distress Cardiovascular: regular rate Pulmonary/Chest: normal work of breathing on room air MSK: no swelling, erythema or calf tenderness of RLE, minimal tenderness of anterior RLE and right heel Neurological: alert & oriented x 3 Skin: small round hypopigmented macules on right foot without erythema, blisters or tenderness  Psych: pleasant mood  Assessment & Plan:   Hypopigmented skin lesion Presents today with few hypopigmented lesions on right foot. States noticed them a few months ago. Does not bother her. Denies any itching, rashes or blisters. She was concerned it was from low B12 or vitamin D. Exam showed small round hypopigmented macules on dorsal of right foot. No rashes, erythema,  tenderness or other findings on exam. Last vitamin B12 level was normal and mildly low vitamin D. On supplementation for both currently. 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 Discussed with patient about this, no further workup at this time.   Pain of right heel Presents with ongoing right heel pain with pain extending up to anterior shin of right LE. States pain for several months roughly at least 5-6 months. Evaluated by [facility] for right plantar fascitis and peroneal tendonitis. Working with  PT but has not been called for recent sessions. Pain worse at end of day, her job requires standing for prolonged period of time. Taken ibuprofen once or twice with relief. Exam today showed no calf tenderness, swelling, erythema of RLE. Minimal tenderness of anterior RLE or foot. Encouraged patient to continue following with [facility] and PT for further management.   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 Tolerating metoprolol and reports fair adherence. HR steady in 60s today with repeat BP 133/78.    Patient discussed with Dr. Rollene Fare, D.O. [facility] Internal Medicine, PGY-2 Phone: [phone] Date 08/03/2022 Time 8:47 PM

## 2022-08-03 NOTE — Assessment & Plan Note (Signed)
Presents with ongoing right heel pain with pain extending up to anterior shin of right LE. States pain for several months roughly at least 5-6 months. Evaluated by [facility] for right plantar fascitis and peroneal tendonitis. Working with PT but has not been called for recent sessions. Pain worse at end of day, her job requires standing for prolonged period of time. Taken ibuprofen once or twice with relief. Exam today showed no calf tenderness, swelling, erythema of RLE. Minimal tenderness of anterior RLE or foot. Encouraged patient to continue following with [facility] and PT for further management.   Plan -Continue f/u with [facility] and PT -Ibuprofen as needed along with supportive care for pain

## 2022-08-03 NOTE — Assessment & Plan Note (Addendum)
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 Tolerating metoprolol and reports fair adherence. HR steady in 60s today with repeat BP 133/78.

## 2022-08-03 NOTE — Assessment & Plan Note (Addendum)
Presents today with few hypopigmented lesions on right foot. States noticed them a few months ago. Does not bother her. Denies any itching, rashes or blisters. She was concerned it was from low B12 or vitamin D. Exam showed small round hypopigmented macules on dorsal of right foot. No rashes, erythema, tenderness or other findings on exam. Last vitamin B12 level was normal and mildly low vitamin D. On supplementation for both currently. 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 Discussed with patient about this, no further workup at this time.

## 2022-08-03 NOTE — Patient Instructions (Addendum)
Thank you, [name] for allowing [place] to provide your care today. Today we discussed:  -White spots on foot does not appear to be any rash or causing other symptoms.  -Reach out about scheduling with physical therapy and can take ibuprofen if needed for leg pain.  -Continue working on eating healthy and staying active.  -Blood pressure better on recheck. Not adding blood pressure medicine today.    Follow up:  1 month    Should you have any questions or concerns please call the internal medicine clinic at [phone].    Rana Snare, D.O. [facility]

## 2022-08-05 NOTE — Progress Notes (Signed)
Internal Medicine Clinic Attending  Case discussed with the resident at the time of the visit.  We reviewed the resident's history and exam and pertinent patient test results.  I agree with the assessment, diagnosis, and plan of care documented in the resident's note.  

## 2022-08-05 NOTE — Addendum Note (Signed)
Addended by: Earl Lagos on: 08/05/2022 10:23 AM   Modules accepted: Level of Service

## 2022-08-16 DIAGNOSIS — M79671 Pain in right foot: Secondary | ICD-10-CM | POA: Diagnosis not present

## 2022-08-16 DIAGNOSIS — M25571 Pain in right ankle and joints of right foot: Secondary | ICD-10-CM | POA: Diagnosis not present

## 2022-08-16 DIAGNOSIS — M25572 Pain in left ankle and joints of left foot: Secondary | ICD-10-CM | POA: Diagnosis not present

## 2022-08-16 DIAGNOSIS — M722 Plantar fascial fibromatosis: Secondary | ICD-10-CM | POA: Diagnosis not present

## 2022-08-17 ENCOUNTER — Telehealth: Payer: Self-pay

## 2022-08-17 ENCOUNTER — Other Ambulatory Visit: Payer: Self-pay | Admitting: Internal Medicine

## 2022-08-17 DIAGNOSIS — S93491A Sprain of other ligament of right ankle, initial encounter: Secondary | ICD-10-CM | POA: Diagnosis not present

## 2022-08-17 DIAGNOSIS — Z1231 Encounter for screening mammogram for malignant neoplasm of breast: Secondary | ICD-10-CM

## 2022-08-17 NOTE — Telephone Encounter (Signed)
Patient called she needs a new referral to get her mammogram done at [facility] imaging.

## 2022-08-17 NOTE — Telephone Encounter (Signed)
complete

## 2022-08-24 DIAGNOSIS — M25571 Pain in right ankle and joints of right foot: Secondary | ICD-10-CM | POA: Diagnosis not present

## 2022-08-24 DIAGNOSIS — M25572 Pain in left ankle and joints of left foot: Secondary | ICD-10-CM | POA: Diagnosis not present

## 2022-08-24 DIAGNOSIS — M79671 Pain in right foot: Secondary | ICD-10-CM | POA: Diagnosis not present

## 2022-08-24 DIAGNOSIS — M722 Plantar fascial fibromatosis: Secondary | ICD-10-CM | POA: Diagnosis not present

## 2022-09-06 DIAGNOSIS — M25571 Pain in right ankle and joints of right foot: Secondary | ICD-10-CM | POA: Diagnosis not present

## 2022-09-06 DIAGNOSIS — M722 Plantar fascial fibromatosis: Secondary | ICD-10-CM | POA: Diagnosis not present

## 2022-09-06 DIAGNOSIS — M25572 Pain in left ankle and joints of left foot: Secondary | ICD-10-CM | POA: Diagnosis not present

## 2022-09-06 DIAGNOSIS — M79671 Pain in right foot: Secondary | ICD-10-CM | POA: Diagnosis not present

## 2022-09-08 DIAGNOSIS — M25571 Pain in right ankle and joints of right foot: Secondary | ICD-10-CM | POA: Diagnosis not present

## 2022-09-08 DIAGNOSIS — M7051 Other bursitis of knee, right knee: Secondary | ICD-10-CM | POA: Diagnosis not present

## 2022-09-13 DIAGNOSIS — M722 Plantar fascial fibromatosis: Secondary | ICD-10-CM | POA: Diagnosis not present

## 2022-09-13 DIAGNOSIS — M25571 Pain in right ankle and joints of right foot: Secondary | ICD-10-CM | POA: Diagnosis not present

## 2022-09-13 DIAGNOSIS — M25572 Pain in left ankle and joints of left foot: Secondary | ICD-10-CM | POA: Diagnosis not present

## 2022-09-13 DIAGNOSIS — M79671 Pain in right foot: Secondary | ICD-10-CM | POA: Diagnosis not present

## 2022-09-22 DIAGNOSIS — M25572 Pain in left ankle and joints of left foot: Secondary | ICD-10-CM | POA: Diagnosis not present

## 2022-09-22 DIAGNOSIS — M79671 Pain in right foot: Secondary | ICD-10-CM | POA: Diagnosis not present

## 2022-09-22 DIAGNOSIS — M722 Plantar fascial fibromatosis: Secondary | ICD-10-CM | POA: Diagnosis not present

## 2022-09-22 DIAGNOSIS — M25571 Pain in right ankle and joints of right foot: Secondary | ICD-10-CM | POA: Diagnosis not present

## 2022-09-28 DIAGNOSIS — M722 Plantar fascial fibromatosis: Secondary | ICD-10-CM | POA: Diagnosis not present

## 2022-09-28 DIAGNOSIS — M25572 Pain in left ankle and joints of left foot: Secondary | ICD-10-CM | POA: Diagnosis not present

## 2022-09-28 DIAGNOSIS — M25571 Pain in right ankle and joints of right foot: Secondary | ICD-10-CM | POA: Diagnosis not present

## 2022-09-28 DIAGNOSIS — M79671 Pain in right foot: Secondary | ICD-10-CM | POA: Diagnosis not present

## 2022-10-04 ENCOUNTER — Ambulatory Visit
Admission: RE | Admit: 2022-10-04 | Discharge: 2022-10-04 | Disposition: A | Discharge status: Home / Self Care | Payer: Medicaid Other | Source: Ambulatory Visit | Attending: Internal Medicine | Admitting: Internal Medicine

## 2022-10-04 DIAGNOSIS — Z1231 Encounter for screening mammogram for malignant neoplasm of breast: Secondary | ICD-10-CM

## 2022-10-11 DIAGNOSIS — M722 Plantar fascial fibromatosis: Secondary | ICD-10-CM | POA: Diagnosis not present

## 2022-10-11 DIAGNOSIS — M25571 Pain in right ankle and joints of right foot: Secondary | ICD-10-CM | POA: Diagnosis not present

## 2022-10-11 DIAGNOSIS — M79671 Pain in right foot: Secondary | ICD-10-CM | POA: Diagnosis not present

## 2022-10-11 DIAGNOSIS — M25572 Pain in left ankle and joints of left foot: Secondary | ICD-10-CM | POA: Diagnosis not present

## 2022-10-12 ENCOUNTER — Other Ambulatory Visit: Payer: Self-pay | Admitting: Internal Medicine

## 2022-10-13 ENCOUNTER — Other Ambulatory Visit: Payer: Self-pay | Admitting: Internal Medicine

## 2022-10-21 ENCOUNTER — Emergency Department (HOSPITAL_BASED_OUTPATIENT_CLINIC_OR_DEPARTMENT_OTHER)
Admission: EM | Admit: 2022-10-21 | Discharge: 2022-10-21 | Disposition: A | Discharge status: Home / Self Care | Payer: Medicaid Other | Attending: Emergency Medicine | Admitting: Emergency Medicine

## 2022-10-21 ENCOUNTER — Encounter (HOSPITAL_BASED_OUTPATIENT_CLINIC_OR_DEPARTMENT_OTHER): Payer: Self-pay | Admitting: Urology

## 2022-10-21 DIAGNOSIS — R519 Headache, unspecified: Secondary | ICD-10-CM | POA: Diagnosis present

## 2022-10-21 DIAGNOSIS — G43809 Other migraine, not intractable, without status migrainosus: Secondary | ICD-10-CM

## 2022-10-21 MED ORDER — METOCLOPRAMIDE HCL 5 MG/ML IJ SOLN
10.0000 mg | Freq: Once | INTRAMUSCULAR | Status: AC
  Administered 2022-10-21: 10 mg via INTRAMUSCULAR
  Filled 2022-10-21: qty 2

## 2022-10-21 MED ORDER — DIPHENHYDRAMINE HCL 50 MG/ML IJ SOLN
25.0000 mg | Freq: Once | INTRAMUSCULAR | Status: AC
  Administered 2022-10-21: 25 mg via INTRAMUSCULAR
  Filled 2022-10-21: qty 1

## 2022-10-21 MED ORDER — KETOROLAC TROMETHAMINE 30 MG/ML IJ SOLN
30.0000 mg | Freq: Once | INTRAMUSCULAR | Status: AC
  Administered 2022-10-21: 30 mg via INTRAMUSCULAR
  Filled 2022-10-21: qty 1

## 2022-10-21 NOTE — ED Notes (Signed)
Pt sts headache better, 2/10 pain. Md notified.

## 2022-10-21 NOTE — ED Triage Notes (Signed)
Pt states headache that started last night after a lot of stress  States sensitive to light and sound Felt anxious throughout the day  Took Excedrin migraine with little relief

## 2022-10-21 NOTE — ED Provider Notes (Signed)
[place] EMERGENCY DEPARTMENT AT [facility] Provider Note   CSN: [id number] Arrival date & time: 10/21/22  1800     History  Chief Complaint  Patient presents with   Migraine    [name] is a 52 y.o. female.  52 year old female who presents with left-sided headache similar to her prior migraines.  Denies any fever or chills.  States that she has been under more stress recently.  Denies any associated photo phobia or phonophobia.  Has had nausea but no vomiting.  No nuchal rigidity.  No sore throat.  No new focal weakness.  No visual changes noted.  Unrelieved with home medications       Home Medications Prior to Admission medications   Medication Sig Start Date End Date Taking? Authorizing Provider  acetaminophen (TYLENOL) 500 MG tablet Take 2 tablets (1,000 mg total) by mouth every 6 (six) hours as needed for moderate pain or headache. 02/25/20   Roylene Reason, MD  cyanocobalamin (,VITAMIN B-12,) 1000 MCG/ML injection  01/11/20   [provider]  cyanocobalamin 1000 MCG tablet Take 1,000 mcg by mouth daily.    [provider]  ferrous sulfate 325 (65 FE) MG tablet Take 1 tablet (325 mg total) by mouth daily with breakfast. Patient not taking: Reported on 02/02/2022 05/13/20   Steffanie Rainwater, MD  levothyroxine (SYNTHROID) 75 MCG tablet TAKE 1 TABLET(75 MCG) BY MOUTH DAILY 01/19/22   Reymundo Poll, MD  meloxicam (MOBIC) 7.5 MG tablet Take 1 tablet (7.5 mg total) by mouth daily. Patient not taking: Reported on 02/02/2022 02/22/20   Linus Mako B, NP  metoprolol tartrate (LOPRESSOR) 25 MG tablet Take 0.5 tablets (12.5 mg total) by mouth 2 (two) times daily. 11/04/21   Croitoru, Mihai, MD  topiramate (TOPAMAX) 50 MG tablet Take 1 tablet daily 03/03/22   Irene Limbo, FNP  Vitamin D, Ergocalciferol, (DRISDOL) 1.25 MG (50000 UNIT) CAPS capsule Take 1 capsule (50,000 Units total) by mouth every 7 (seven) days. 02/08/22   Irene Limbo, FNP  azelastine (ASTELIN) 0.1 % nasal spray Place 1 spray into both nostrils 2 (two) times daily. Use in each nostril as directed Patient not taking: Reported on 03/05/2019 12/01/17 12/24/19  Zachery Dauer, NP  DULoxetine (CYMBALTA) 30 MG capsule Take 1 capsule (30 mg total) by mouth daily. 12/19/19 12/24/19  Seawell, Jaimie A, DO  gabapentin (NEURONTIN) 300 MG capsule TAKE 1 CAPSULE(300 MG) BY MOUTH TWICE DAILY Patient not taking: Reported on 08/14/2019 05/23/19 12/24/19  Dolan Amen, MD      Allergies    Bee venom, Bactrim [sulfamethoxazole-trimethoprim], Codeine, and Sulfamethoxazole-trimethoprim    Review of Systems   Review of Systems  All other systems reviewed and are negative.   Physical Exam Updated Vital Signs BP (!) 150/84 (BP Location: Right Arm)   Pulse 70   Temp 98.1 F (36.7 C)   Resp 16   Ht 1.651 m (5\' 5" )   Wt 111.9 kg   LMP 03/31/2013 (Exact Date)   SpO2 100%   BMI 41.05 kg/m  Physical Exam Vitals and nursing note reviewed.  Constitutional:      General: She is not in acute distress.    Appearance: Normal appearance. She is well-developed. She is not toxic-appearing.  HENT:     Head: Normocephalic and atraumatic.  Eyes:     General: Lids are normal.     Conjunctiva/sclera: Conjunctivae normal.     Pupils: Pupils are equal, round, and reactive to light.  Neck:  Thyroid: No thyroid mass.     Trachea: No tracheal deviation.  Cardiovascular:     Rate and Rhythm: Normal rate and regular rhythm.     Heart sounds: Normal heart sounds. No murmur heard.    No gallop.  Pulmonary:     Effort: Pulmonary effort is normal. No respiratory distress.     Breath sounds: Normal breath sounds. No stridor. No decreased breath sounds, wheezing, rhonchi or rales.  Abdominal:     General: There is no distension.     Palpations: Abdomen is soft.     Tenderness: There is no abdominal tenderness. There is no rebound.  Musculoskeletal:        General: No  tenderness. Normal range of motion.     Cervical back: Normal range of motion and neck supple.  Skin:    General: Skin is warm and dry.     Findings: No abrasion or rash.  Neurological:     General: No focal deficit present.     Mental Status: She is alert and oriented to person, place, and time. Mental status is at baseline.     GCS: GCS eye subscore is 4. GCS verbal subscore is 5. GCS motor subscore is 6.     Cranial Nerves: Cranial nerves are intact. No cranial nerve deficit.     Sensory: No sensory deficit.     Motor: Motor function is intact.  Psychiatric:        Attention and Perception: Attention normal.        Speech: Speech normal.        Behavior: Behavior normal.     ED Results / Procedures / Treatments   Labs (all labs ordered are listed, but only abnormal results are displayed) Labs Reviewed - No data to display  EKG None  Radiology No results found.  Procedures Procedures    Medications Ordered in ED Medications  ketorolac (TORADOL) 30 MG/ML injection 30 mg (has no administration in time range)  metoCLOPramide (REGLAN) injection 10 mg (has no administration in time range)  diphenhydrAMINE (BENADRYL) injection 25 mg (has no administration in time range)    ED Course/ Medical Decision Making/ A&P                                 Medical Decision Making Risk Prescription drug management.   Patient treated with migraine cocktail and feels better at this time.  Repeat neurological exam remained stable.  Low suspicion for subarachnoid or meningitis.  Will discharge home        Final Clinical Impression(s) / ED Diagnoses Final diagnoses:  None    Rx / DC Orders ED Discharge Orders     None         Lorre Nick, MD 10/21/22 2055

## 2022-10-25 DIAGNOSIS — M79671 Pain in right foot: Secondary | ICD-10-CM | POA: Diagnosis not present

## 2022-10-25 DIAGNOSIS — M25571 Pain in right ankle and joints of right foot: Secondary | ICD-10-CM | POA: Diagnosis not present

## 2022-10-25 DIAGNOSIS — M722 Plantar fascial fibromatosis: Secondary | ICD-10-CM | POA: Diagnosis not present

## 2022-10-25 DIAGNOSIS — M25572 Pain in left ankle and joints of left foot: Secondary | ICD-10-CM | POA: Diagnosis not present

## 2022-11-02 DIAGNOSIS — M722 Plantar fascial fibromatosis: Secondary | ICD-10-CM | POA: Diagnosis not present

## 2022-11-02 DIAGNOSIS — M25572 Pain in left ankle and joints of left foot: Secondary | ICD-10-CM | POA: Diagnosis not present

## 2022-11-02 DIAGNOSIS — M79671 Pain in right foot: Secondary | ICD-10-CM | POA: Diagnosis not present

## 2022-11-02 DIAGNOSIS — M25571 Pain in right ankle and joints of right foot: Secondary | ICD-10-CM | POA: Diagnosis not present

## 2022-11-04 DIAGNOSIS — S93491A Sprain of other ligament of right ankle, initial encounter: Secondary | ICD-10-CM | POA: Diagnosis not present

## 2022-11-04 DIAGNOSIS — M7671 Peroneal tendinitis, right leg: Secondary | ICD-10-CM | POA: Diagnosis not present

## 2022-11-04 DIAGNOSIS — M722 Plantar fascial fibromatosis: Secondary | ICD-10-CM | POA: Diagnosis not present

## 2022-11-15 DIAGNOSIS — M79671 Pain in right foot: Secondary | ICD-10-CM | POA: Diagnosis not present

## 2022-11-15 DIAGNOSIS — M25571 Pain in right ankle and joints of right foot: Secondary | ICD-10-CM | POA: Diagnosis not present

## 2022-11-15 DIAGNOSIS — M25572 Pain in left ankle and joints of left foot: Secondary | ICD-10-CM | POA: Diagnosis not present

## 2022-11-15 DIAGNOSIS — M722 Plantar fascial fibromatosis: Secondary | ICD-10-CM | POA: Diagnosis not present

## 2022-11-20 IMAGING — CT CT CERVICAL SPINE W/O CM
2 series · 10 of 14 positions shown, 12 images · non-contrast
Comparison: MRI cervical spine 01/13/2019

CLINICAL DATA: Headache.  Left arm pain.  Fusion April 2019

EXAM:
CT CERVICAL SPINE WITHOUT CONTRAST
TECHNIQUE: Multidetector CT imaging of the cervical spine was performed without
intravenous contrast. Multiplanar CT image reconstructions were also
generated.

[Series 2: cspine soft · axial · 0.25mm/px · z∈[+676,+814]mm · 5 of 105 slices shown, 7 images]
[im 18/105  soft-tissue]
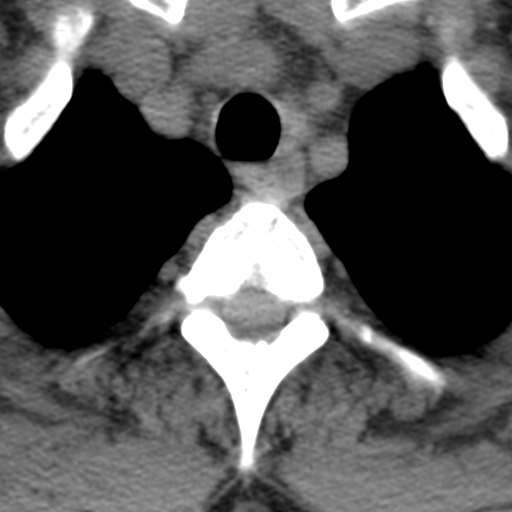
[im 18/105  bone]
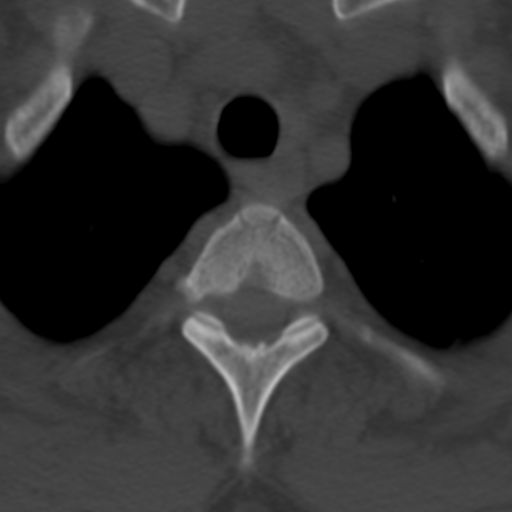
[im 35/105  bone]
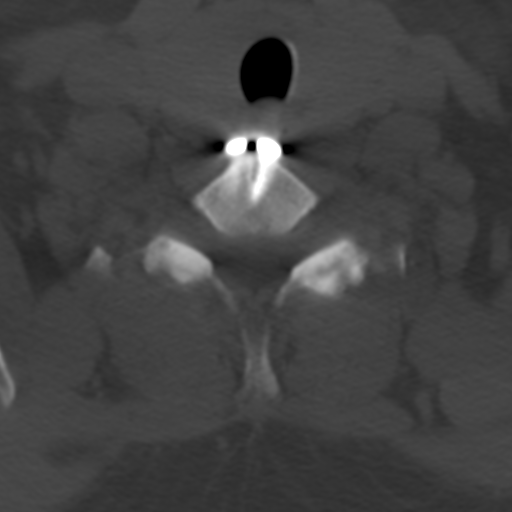
[im 53/105  bone]
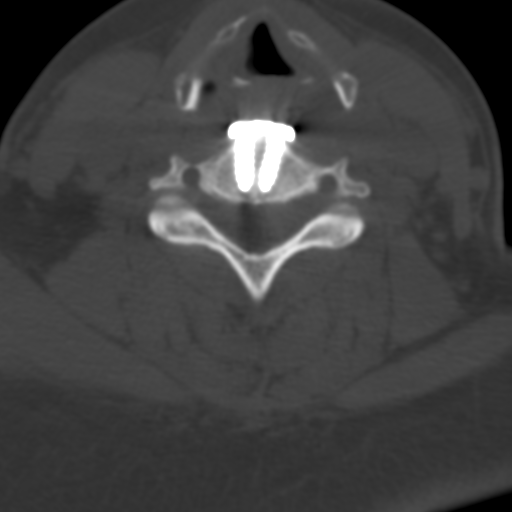
[im 70/105  bone]
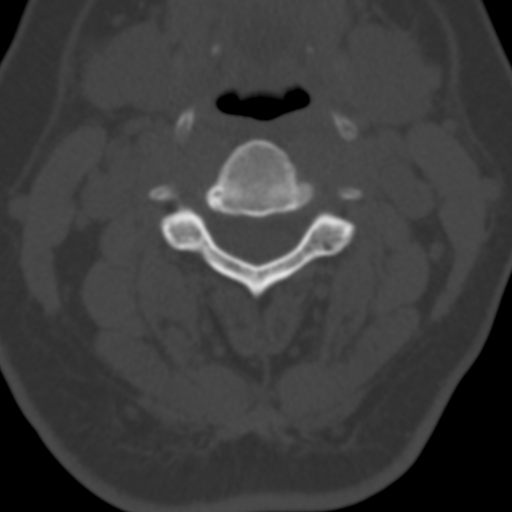
[im 87/105  soft-tissue]
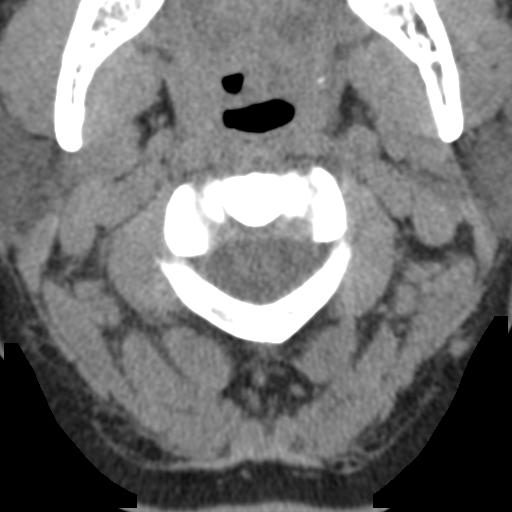
[im 87/105  bone]
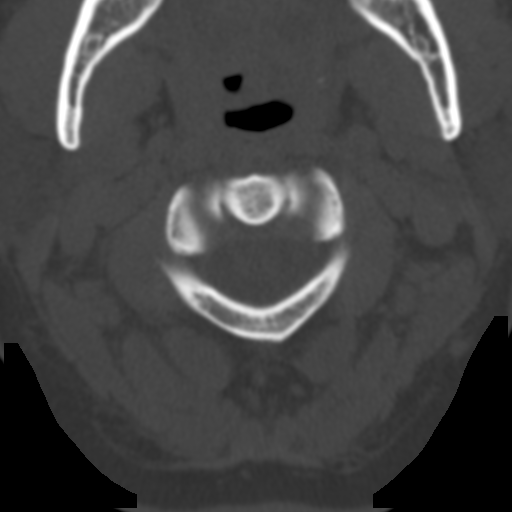

[Series 8: angled axial soft · axial · 0.24mm/px · z∈[+678,+814]mm · 5 of 104 slices shown]
[im 18/104  soft-tissue]
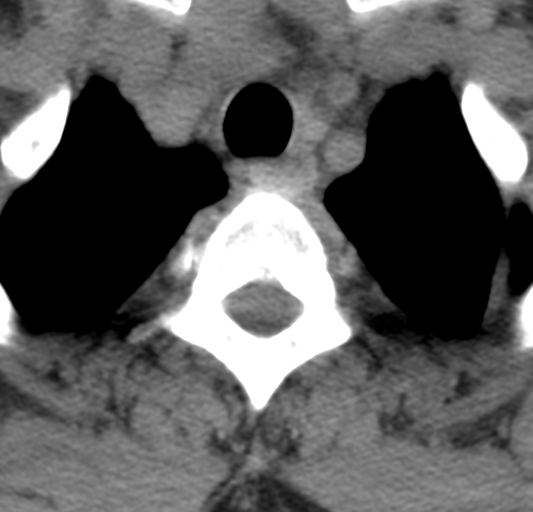
[im 35/104  soft-tissue]
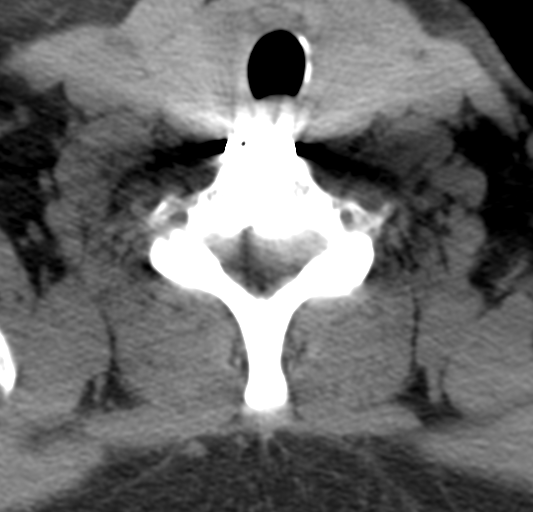
[im 52/104  soft-tissue]
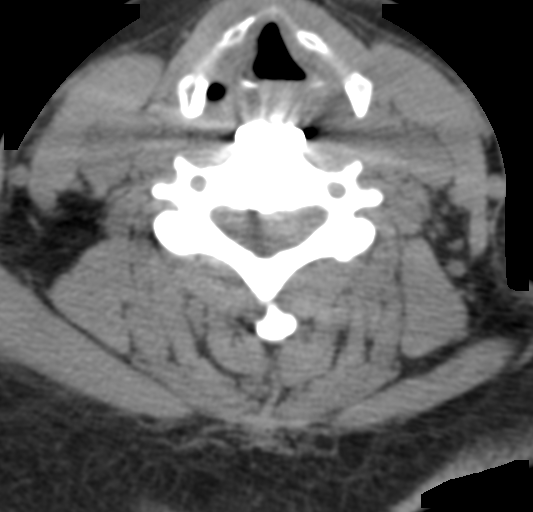
[im 69/104  soft-tissue]
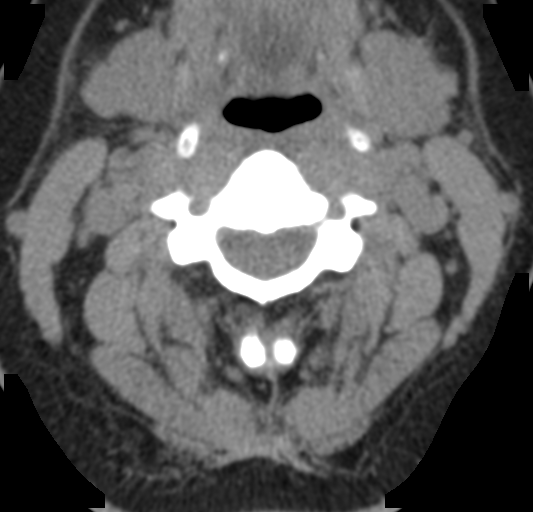
[im 86/104  soft-tissue]
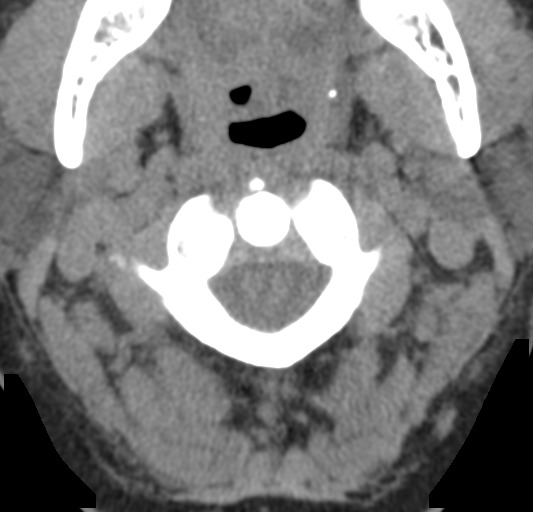

[10 of 14 positions shown; findings below may reference images not displayed]

FINDINGS: Alignment: Normal

Skull base and vertebrae: Negative for fracture or mass.

Hardware: ACDF C4 through C7. Anterior plate and screws in good
position. Interbody bone graft and spacer at C4-5, C5-6, and C6-7 in
good position.

Soft tissues and spinal canal: Negative

Disc levels:  C2-3: Negative

C3-4: Shallow central disc protrusion. Diffuse uncinate spurring.
Mild spinal stenosis unchanged. Mild to moderate foraminal stenosis
bilaterally also unchanged.

C4-5: ACDF. Solid bony fusion. Improvement in spinal stenosis which
is now mild. No significant foraminal stenosis.

C5-6: ACDF. Solid bony fusion. Mild spinal stenosis. Significant
improvement in stenosis since the prior study. Neural foramina
patent bilaterally.

C6-7: ACDF. Possible early bony fusion. Follow-up recommended for
confirmation. Diffuse uncinate spurring is present with mild spinal
stenosis and mild foraminal stenosis bilaterally. Improvement since
the prior MRI.

C7-T1: Mild facet degeneration.  Negative for stenosis.

Upper chest: Lung apices clear bilaterally.

Other: None
IMPRESSION: 1. ACDF C4 through C7.
2. ACDF C4-5 with solid fusion. Improvement in spinal stenosis which
is now mild.
3. ACDF C5-6 with solid fusion. Significant improvement in spinal
stenosis.
4. ACDF C6-7. Possible early fusion. Follow-up recommended.
Improvement in spinal and foraminal stenosis since the prior study.
5. Central disc protrusion at C3-4. Mild spinal stenosis and mild to
moderate foraminal stenosis bilaterally.

## 2022-11-22 ENCOUNTER — Encounter: Payer: Self-pay | Admitting: Cardiovascular Disease

## 2022-11-22 ENCOUNTER — Ambulatory Visit: Payer: Medicaid Other | Attending: Cardiovascular Disease | Admitting: Cardiovascular Disease

## 2022-11-22 ENCOUNTER — Ambulatory Visit (INDEPENDENT_AMBULATORY_CARE_PROVIDER_SITE_OTHER): Payer: Medicaid Other

## 2022-11-22 VITALS — BP 154/86 | HR 65 | Ht 66.0 in | Wt 251.0 lb

## 2022-11-22 DIAGNOSIS — I471 Supraventricular tachycardia, unspecified: Secondary | ICD-10-CM

## 2022-11-22 NOTE — Progress Notes (Unsigned)
Cardiology Office Note:    Date:  11/25/2022   ID:  [name], DOB July 13, 1970, MRN [id number]  PCP:  Rana Snare, DO   [facility] Providers Cardiologist:  Thurmon Fair, MD     Referring MD: Inez Catalina, MD   Chief Complaint  Patient presents with   Irregular Heart Beat    History of Present Illness:    [name] is a 52 y.o. 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  She generally been feeling okay and has not required urgent evaluation for arrhythmia recently, although she was seen in the emergency room 10/21/2022 with a migraine.  She is worried because recently one of her aunts had unexplained sudden cardiac arrest after undergoing lumbar surgery, in her early 60s.  She previously had narrow complex tachycardia with short RP mechanism, but without distinct retrograde P waves well outside the terminal QRS complex in September 2023, resolved spontaneously before adenosine bolus, no recurrence on low-dose metoprolol therapy.  Her most recent TSH was normal at 3.07.  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  Her total cholesterol 3 months ago was 248 with an LDL of 173 and HDL of 57 and normal triglycerides.  She had a total hysterectomy with bilateral salpingectomy in 2015 after having severe dysfunctional uterine bleeding due to fibroids, requiring blood transfusion.  Past Medical History:  Diagnosis Date   Arthritis    B12 deficiency    Back pain    Elevated blood pressure reading    Fibroids, submucosal    History of blood transfusion 02/19/2011   [facility] - ? 2-3 units transfused   Hypothyroidism    Iron deficiency anemia    Vitamin D deficiency     Past  Surgical History:  Procedure Laterality Date   ANTERIOR CERVICAL DECOMPRESSION/DISCECTOMY FUSION 4 LEVELS N/A 04/26/2019   Procedure: ANTERIOR CERVICAL DECOMPRESSION FUSION CERVICAL 4-5, CERVICAL 5-6, CERVICAL 6-7 WITH INSTRUMENTATION AND ALLOGRAFT;  Surgeon: Estill Bamberg, MD;  Location: [facility] OR;  Service: Orthopedics;  Laterality: N/A;   APPENDECTOMY     BILATERAL SALPINGECTOMY Bilateral 04/16/2013   Procedure: BILATERAL SALPINGECTOMY;  Surgeon: Willodean Rosenthal, MD;  Location: [facility];  Service: Gynecology;  Laterality: Bilateral;   CESAREAN SECTION  1994, 2002   DILATION AND CURETTAGE OF UTERUS N/A 03/31/2012   Procedure: DILATATION AND CURETTAGE;  Surgeon: Allie Bossier, MD;  Location: [facility];  Service: Gynecology;  Laterality: N/A;   LAPAROSCOPIC LYSIS OF ADHESIONS N/A 03/31/2012   Procedure: LAPAROSCOPIC LYSIS OF ADHESIONS;  Surgeon: Allie Bossier, MD;  Location: [facility];  Service: Gynecology;  Laterality: N/A;   LAPAROSCOPY N/A 03/31/2012   Procedure: LAPAROSCOPY OPERATIVE;  Surgeon: Allie Bossier, MD;  Location: [facility];  Service: Gynecology;  Laterality: N/A;   NOVASURE ABLATION N/A 03/31/2012   Procedure: NOVASURE ABLATION;  Surgeon: Allie Bossier, MD;  Location: [facility];  Service: Gynecology;  Laterality: N/A;   ROBOTIC ASSISTED TOTAL HYSTERECTOMY N/A 04/16/2013   Procedure: ROBOTIC ASSISTED TOTAL HYSTERECTOMY;  Surgeon: Willodean Rosenthal, MD;  Location: [facility];  Service: Gynecology;  Laterality: N/A;   TUBAL LIGATION  2002   WISDOM TOOTH EXTRACTION      Current Medications: Current Meds  Medication Sig   Cholecalciferol (D 1000) 25  MCG (1000 UT) capsule Take 1,000 Units by mouth daily.   cyanocobalamin 1000 MCG tablet Take 1,000 mcg by mouth daily.   levothyroxine (SYNTHROID) 75 MCG tablet TAKE 1 TABLET(75 MCG) BY MOUTH DAILY   metoprolol tartrate (LOPRESSOR) 25 MG tablet Take 0.5 tablets (12.5 mg total) by mouth 2 (two) times daily.     Allergies:   Bee venom, Bactrim  [sulfamethoxazole-trimethoprim], Codeine, and Sulfamethoxazole-trimethoprim   Social History   Socioeconomic History   Marital status: Single    Spouse name: Not on file   Number of children: Not on file   Years of education: Not on file   Highest education level: Not on file  Occupational History   Not on file  Tobacco Use   Smoking status: Former    Current packs/day: 0.00    Types: Cigarettes    Quit date: 01/21/2011    Years since quitting: 11.8   Smokeless tobacco: Never  Vaping Use   Vaping status: Never Used  Substance and Sexual Activity   Alcohol use: Yes    Comment: social occasions   Drug use: No   Sexual activity: Not Currently    Birth control/protection: Condom, Surgical  Other Topics Concern   Not on file  Social History Narrative   Not on file   Social Determinants of Health   Financial Resource Strain: Medium Risk (11/26/2021)   Overall Financial Resource Strain (CARDIA)    Difficulty of Paying Living Expenses: Somewhat hard  Food Insecurity: No Food Insecurity (11/26/2021)   Hunger Vital Sign    Worried About Running Out of Food in the Last Year: Never true    Ran Out of Food in the Last Year: Never true  Transportation Needs: No Transportation Needs (11/26/2021)   PRAPARE - Administrator, Civil Service (Medical): No    Lack of Transportation (Non-Medical): No  Physical Activity: Not on file  Stress: No Stress Concern Present (01/01/2022)   [employer] of Occupational Health - Occupational Stress Questionnaire    Feeling of Stress : Not at all  Social Connections: Moderately Isolated (11/26/2021)   Social Connection and Isolation Panel [NHANES]    Frequency of Communication with Friends and Family: More than three times a week    Frequency of Social Gatherings with Friends and Family: Once a week    Attends Religious Services: More than 4 times per year    Active Member of [employer] or Organizations: No    Attends Banker  Meetings: Never    Marital Status: Never married     Family History: The patient's family history includes Fibroids in her sister; Hypertension in her mother; Seizures in her father; Sudden death in her father. There is no history of Breast cancer, Colon cancer, Colon polyps, Esophageal cancer, Rectal cancer, or Stomach cancer.  ROS:   Please see the history of present illness.     All other systems reviewed and are negative.  EKGs/Labs/Other Studies Reviewed:    The following studies were reviewed today:    EKG Interpretation Date/Time:  Monday November 22 2022 14:12:28 EST Ventricular Rate:  65 PR Interval:  160 QRS Duration:  76 QT Interval:  426 QTC Calculation: 443 R Axis:   26  Text Interpretation: Normal sinus rhythm Normal ECG When compared with ECG of 03-Oct-2021 01:42, PREVIOUS ECG IS PRESENT Confirmed by Lacie Landry [phone]) on 11/22/2022 2:41:32 PM          Recent Labs: 11/26/2021: TSH 3.070  Recent  Lipid Panel    Component Value Date/Time   CHOL 248 (H) 07/22/2021 1051   TRIG 104 07/22/2021 1051   HDL 57 07/22/2021 1051   LDLCALC 173 (H) 07/22/2021 1051     Risk Assessment/Calculations:             Physical Exam:    VS:  BP (!) 154/86 (BP Location: Right Arm, Patient Position: Sitting, Cuff Size: Large)   Pulse 65   Ht 5\' 6"  (1.676 m)   Wt 251 lb (113.9 kg)   LMP 03/31/2013 (Exact Date)   SpO2 99%   BMI 40.51 kg/m     Wt Readings from Last 3 Encounters:  11/22/22 251 lb (113.9 kg)  10/21/22 246 lb 11.1 oz (111.9 kg)  08/03/22 246 lb 9.6 oz (111.9 kg)     GEN: Severely obese, well nourished, well developed in no acute distress HEENT: Normal NECK: No JVD; No carotid bruits LYMPHATICS: No lymphadenopathy CARDIAC: RRR, no murmurs, rubs, gallops RESPIRATORY:  Clear to auscultation without rales, wheezing or rhonchi  ABDOMEN: Soft, non-tender, non-distended MUSCULOSKELETAL:  No edema; No deformity  SKIN: Warm and dry NEUROLOGIC:   Alert and oriented x 3 PSYCHIATRIC:  Normal affect   ASSESSMENT:    1. 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  So far doing well on beta-blocker therapy.  If she has recurrent events on beta-blockers we may be limited by sinus bradycardia and she may do better with referral for ablation.  We will check an echocardiogram to exclude structural heart disease.  Would be helpful to have a personal electronic rhythm monitoring device such as a smart watch or Kardia.  Will have her wear a monitor due to her concern about malignant arrhythmias in her family. Family history of cardiac arrest: In her previously healthy middle-aged aunt.  Following surgery. Pernicious anemia: Receiving B12 supplements. Hypothyroidism: Clinically euthyroid.           Medication Adjustments/Labs and Tests Ordered: Current medicines are reviewed at length with the patient today.  Concerns regarding medicines are outlined above.  Orders Placed This Encounter  Procedures   LONG TERM MONITOR (3-14 DAYS)   EKG 12-Lead   No orders of the defined types were placed in this encounter.   Patient Instructions  Medication Instructions:   NO CHANGES   *If you need a refill on your cardiac medications before your next appointment, please call your pharmacy*   Lab Work: NONE   If you have labs (blood work) drawn today and your tests are completely normal, you will receive your results only by: MyChart Message (if you have MyChart) OR A paper copy in the mail If you have any lab test that is abnormal or we need to change your treatment, we will call you to review the results.   Testing/Procedures: Christena Deem- Long Term Monitor Instructions  Your physician has requested you wear a ZIO patch monitor for 14 days.   This is a single patch monitor. [employer] supplies one patch monitor per enrollment. Additional stickers are not available. Please do not apply patch if you will be having a Nuclear Stress Test,  Echocardiogram, Cardiac CT, MRI, or Chest Xray during the period you would be wearing the  monitor. The patch cannot be worn during these tests. You  cannot remove and re-apply the  ZIO XT patch monitor.  Your ZIO patch monitor will be mailed 3 day USPS to your address on file. It may take 3-5 days  to receive your monitor after you have been enrolled.  Once you have received your monitor, please review the enclosed instructions. Your monitor  has already been registered assigning a specific monitor serial # to you.  Billing and Patient Assistance Program Information  We have supplied [employer] with any of your insurance information on file for billing purposes. [employer] offers a sliding scale Patient Assistance Program for patients that do not have  insurance, or whose insurance does not completely cover the cost of the ZIO monitor.  You must apply for the Patient Assistance Program to qualify for this discounted rate.  To apply, please call [employer] at [phone], select option 4, select option 2, ask to apply for  Patient Assistance Program. [name] will ask your household income, and how many people  are in your household. They will quote your out-of-pocket cost based on that information.  [employer] will also be able to set up a 42-month, interest-free payment plan if needed.  Applying the monitor   Shave hair from upper left chest.  Hold abrader disc by orange tab. Rub abrader in 40 strokes over the upper left chest as  indicated in your monitor instructions.  Clean area with 4 enclosed alcohol pads. Let dry.  Apply patch as indicated in monitor instructions. Patch will be placed under collarbone on left  side of chest with arrow pointing upward.  Rub patch adhesive wings for 2 minutes. Remove  white label marked "1". Remove the white  label marked "2". Rub patch adhesive wings for 2 additional minutes.  While looking in a mirror, press and release button in center of patch. A small green light will  flash 3-4 times. This will be your only indicator that the monitor has been turned on.  Do not shower for the first 24 hours. You may shower after the first 24 hours.  Press the button if you feel a symptom. You will hear a small click. Record Date, Time and  Symptom in the Patient Logbook.  When you are ready to remove the patch, follow instructions on the last 2 pages of Patient  Logbook. Stick patch monitor onto the last page of Patient Logbook.  Place Patient Logbook in the blue and white box. Use locking tab on box and tape box closed  securely. The blue and white box has prepaid postage on it. Please place it in the mailbox as  soon as possible. Your physician should have your test results approximately 7 days after the  monitor has been mailed back to [facility].  Call [facility] Customer Care at [phone] if you have questions regarding  your ZIO XT patch monitor. Call them immediately if you see an orange light blinking on your  monitor.  If your monitor falls off in less than 4 days, contact our Monitor department at [phone].  If your monitor becomes loose or falls off after 4 days call [employer] at [phone] for  suggestions on securing your monitor    Follow-Up: At [facility], you and your health needs are our priority.  As part of our continuing mission to provide you with exceptional heart care, we have created designated Provider Care Teams.  These Care Teams include your primary Cardiologist (physician) and Advanced Practice Providers (APPs -  Physician Assistants and Nurse Practitioners) who all work  together to provide you with the care you need, when you need it.  We recommend signing up for the patient portal called "MyChart".  Sign up  information is provided on this After Visit Summary.  MyChart is used to connect with patients for Virtual Visits (Telemedicine).  Patients are able to view lab/test results, encounter notes, upcoming appointments, etc.  Non-urgent messages can be sent to your provider as well.   To learn more about what you can do with MyChart, go to [email or web address].    Your next appointment:   1 year(s)  The format for your next appointment:   In Person  Provider:   Thurmon Fair, MD    Other Instructions How to Take Your Blood Pressure Blood pressure is a measurement of how strongly your blood is pressing against the walls of your arteries. Arteries are blood vessels that carry blood from your heart throughout your body. Your health care provider takes your blood pressure at each office visit. You can also take your own blood pressure at home with a blood pressure monitor. You may need to take your own blood pressure to: Confirm a diagnosis of high blood pressure (hypertension). Monitor your blood pressure over time. Make sure your blood pressure medicine is working. Supplies needed: Blood pressure monitor. A chair to sit in. This should be a chair where you can sit upright with your back supported. Do not sit on a soft couch or an armchair. Table or desk. Small notebook and pencil or pen. How to prepare To get the most accurate reading, avoid the following for 30 minutes before you check your blood pressure: Drinking caffeine. Drinking alcohol. Eating. Smoking. Exercising. Five minutes before you check your blood pressure: Use the bathroom and urinate so that you have an empty bladder. Sit quietly in a chair. Do not talk. How to take your blood pressure To check your blood pressure, follow the instructions in the manual that came with your blood pressure monitor. If you have a digital blood pressure monitor, the instructions may be as follows: Sit up straight in a chair. Place your  feet on the floor. Do not cross your ankles or legs. Rest your left arm at the level of your heart on a table or desk or on the arm of a chair. Pull up your shirt sleeve. Wrap the blood pressure cuff around the upper part of your left arm, 1 inch (2.5 cm) above your elbow. It is best to wrap the cuff around bare skin. Fit the cuff snugly, but not too tightly, around your arm. You should be able to place only one finger between the cuff and your arm. Position the cord so that it rests in the bend of your elbow. Press the power button. Sit quietly while the cuff inflates and deflates. Read the digital reading on the monitor screen and write the numbers down (record them) in a notebook. Wait 2-3 minutes, then repeat the steps, starting at step 1. What does my blood pressure reading mean? A blood pressure reading consists of a higher number over a lower number. Ideally, your blood pressure should be below 120/80. The first ("top") number is called the systolic pressure. It is a measure of the pressure in your arteries as your heart beats. The second ("bottom") number is called the diastolic pressure. It is a measure of the pressure in your arteries as the heart relaxes. Blood pressure is classified into four stages. The following are the stages for adults who do  not have a short-term serious illness or a chronic condition. Systolic pressure and diastolic pressure are measured in a unit called mm Hg (millimeters of mercury).  Normal Systolic pressure: below 120. Diastolic pressure: below 80. Elevated Systolic pressure: 120-129. Diastolic pressure: below 80. Hypertension stage 1 Systolic pressure: 130-139. Diastolic pressure: 80-89. Hypertension stage 2 Systolic pressure: 140 or above. Diastolic pressure: 90 or above. You can have elevated blood pressure or hypertension even if only the systolic or only the diastolic number in your reading is higher than normal. Follow these instructions at  home: Medicines Take over-the-counter and prescription medicines only as told by your health care provider. Tell your health care provider if you are having any side effects from blood pressure medicine. General instructions Check your blood pressure as often as recommended by your health care provider. Check your blood pressure at the same time every day. Take your monitor to the next appointment with your health care provider to make sure that: You are using it correctly. It provides accurate readings. Understand what your goal blood pressure numbers are. Keep all follow-up visits. This is important. General tips Your health care provider can suggest a reliable monitor that will meet your needs. There are several types of home blood pressure monitors. Choose a monitor that has an arm cuff. Do not choose a monitor that measures your blood pressure from your wrist or finger. Choose a cuff that wraps snugly, not too tight or too loose, around your upper arm. You should be able to fit only one finger between your arm and the cuff. You can buy a blood pressure monitor at most drugstores or online. Where to find more information American Heart Association: www.heart.org Contact a health care provider if: Your blood pressure is consistently high. Your blood pressure is suddenly low. Get help right away if: Your systolic blood pressure is higher than 180. Your diastolic blood pressure is higher than 120. These symptoms may be an emergency. Get help right away. Call 911. Do not wait to see if the symptoms will go away. Do not drive yourself to the hospital. Summary Blood pressure is a measurement of how strongly your blood is pressing against the walls of your arteries. A blood pressure reading consists of a higher number over a lower number. Ideally, your blood pressure should be below 120/80. Check your blood pressure at the same time every day. Avoid caffeine, alcohol, smoking, and  exercise for 30 minutes prior to checking your blood pressure. These agents can affect the accuracy of the blood pressure reading. This information is not intended to replace advice given to you by your health care provider. Make sure you discuss any questions you have with your health care provider. Document Revised: 09/18/2020 Document Reviewed: 09/18/2020 Elsevier Patient Education  2024 [employer].    Signed, Thurmon Fair, MD  11/25/2022 2:18 PM    [facility]

## 2022-11-22 NOTE — Progress Notes (Unsigned)
Enrolled for [employer] to mail a ZIO XT long term holter monitor to the patients address on file.  

## 2022-11-22 NOTE — Patient Instructions (Signed)
Medication Instructions:   NO CHANGES   *If you need a refill on your cardiac medications before your next appointment, please call your pharmacy*   Lab Work: NONE   If you have labs (blood work) drawn today and your tests are completely normal, you will receive your results only by: MyChart Message (if you have MyChart) OR A paper copy in the mail If you have any lab test that is abnormal or we need to change your treatment, we will call you to review the results.   Testing/Procedures: [name]- Long Term Monitor Instructions  Your physician has requested you wear a ZIO patch monitor for 14 days.  This is a single patch monitor. [employer] supplies one patch monitor per enrollment. Additional stickers are not available. Please do not apply patch if you will be having a Nuclear Stress Test,  Echocardiogram, Cardiac CT, MRI, or Chest Xray during the period you would be wearing the  monitor. The patch cannot be worn during these tests. You cannot remove and re-apply the  ZIO XT patch monitor.  Your ZIO patch monitor will be mailed 3 day USPS to your address on file. It may take 3-5 days  to receive your monitor after you have been enrolled.  Once you have received your monitor, please review the enclosed instructions. Your monitor  has already been registered assigning a specific monitor serial # to you.  Billing and Patient Assistance Program Information  We have supplied [employer] with any of your insurance information on file for billing purposes. [employer] offers a sliding scale Patient Assistance Program for patients that do not have  insurance, or whose insurance does not completely cover the cost of the ZIO monitor.  You must apply for the Patient Assistance Program to qualify for this discounted rate.  To apply, please call [employer] at [phone], select option 4, select option 2, ask to apply for  Patient Assistance Program. [name] will ask your household income, and how many  people  are in your household. They will quote your out-of-pocket cost based on that information.  [employer] will also be able to set up a 57-month, interest-free payment plan if needed.  Applying the monitor   Shave hair from upper left chest.  Hold abrader disc by orange tab. Rub abrader in 40 strokes over the upper left chest as  indicated in your monitor instructions.  Clean area with 4 enclosed alcohol pads. Let dry.  Apply patch as indicated in monitor instructions. Patch will be placed under collarbone on left  side of chest with arrow pointing upward.  Rub patch adhesive wings for 2 minutes. Remove white label marked "1". Remove the white  label marked "2". Rub patch adhesive wings for 2 additional minutes.  While looking in a mirror, press and release button in center of patch. A small green light will  flash 3-4 times. This will be your only indicator that the monitor has been turned on.  Do not shower for the first 24 hours. You may shower after the first 24 hours.  Press the button if you feel a symptom. You will hear a small click. Record Date, Time and  Symptom in the Patient Logbook.  When you are ready to remove the patch, follow instructions on the last 2 pages of Patient  Logbook. Stick patch monitor onto the last page of Patient Logbook.  Place Patient Logbook in the blue and white box. Use locking tab on box and tape box closed  securely. The  blue and white box has prepaid postage on it. Please place it in the mailbox as  soon as possible. Your physician should have your test results approximately 7 days after the  monitor has been mailed back to [facility].  Call [facility] Customer Care at [phone] if you have questions regarding  your ZIO XT patch monitor. Call them immediately if you see an orange light blinking on your  monitor.  If your monitor falls off in less than 4 days, contact our Monitor department at [phone].  If your monitor becomes  loose or falls off after 4 days call [employer] at [phone] for  suggestions on securing your monitor    Follow-Up: At [facility], you and your health needs are our priority.  As part of our continuing mission to provide you with exceptional heart care, we have created designated Provider Care Teams.  These Care Teams include your primary Cardiologist (physician) and Advanced Practice Providers (APPs -  Physician Assistants and Nurse Practitioners) who all work together to provide you with the care you need, when you need it.  We recommend signing up for the patient portal called "MyChart".  Sign up information is provided on this After Visit Summary.  MyChart is used to connect with patients for Virtual Visits (Telemedicine).  Patients are able to view lab/test results, encounter notes, upcoming appointments, etc.  Non-urgent messages can be sent to your provider as well.   To learn more about what you can do with MyChart, go to [email or web address].    Your next appointment:   1 year(s)  The format for your next appointment:   In Person  Provider:   Thurmon Fair, MD    Other Instructions How to Take Your Blood Pressure Blood pressure is a measurement of how strongly your blood is pressing against the walls of your arteries. Arteries are blood vessels that carry blood from your heart throughout your body. Your health care provider takes your blood pressure at each office visit. You can also take your own blood pressure at home with a blood pressure monitor. You may need to take your own blood pressure to: Confirm a diagnosis of high blood pressure (hypertension). Monitor your blood pressure over time. Make sure your blood pressure medicine is working. Supplies needed: Blood pressure monitor. A chair to sit in. This should be a chair where you can sit upright with your back supported. Do not sit on a soft couch or an armchair. Table or desk. Small notebook and pencil or  pen. How to prepare To get the most accurate reading, avoid the following for 30 minutes before you check your blood pressure: Drinking caffeine. Drinking alcohol. Eating. Smoking. Exercising. Five minutes before you check your blood pressure: Use the bathroom and urinate so that you have an empty bladder. Sit quietly in a chair. Do not talk. How to take your blood pressure To check your blood pressure, follow the instructions in the manual that came with your blood pressure monitor. If you have a digital blood pressure monitor, the instructions may be as follows: Sit up straight in a chair. Place your feet on the floor. Do not cross your ankles or legs. Rest your left arm at the level of your heart on a table or desk or on the arm of a chair. Pull up your shirt sleeve. Wrap the blood pressure cuff around the upper part of your left arm, 1 inch (2.5 cm) above your elbow. It is best to wrap the cuff  around bare skin. Fit the cuff snugly, but not too tightly, around your arm. You should be able to place only one finger between the cuff and your arm. Position the cord so that it rests in the bend of your elbow. Press the power button. Sit quietly while the cuff inflates and deflates. Read the digital reading on the monitor screen and write the numbers down (record them) in a notebook. Wait 2-3 minutes, then repeat the steps, starting at step 1. What does my blood pressure reading mean? A blood pressure reading consists of a higher number over a lower number. Ideally, your blood pressure should be below 120/80. The first ("top") number is called the systolic pressure. It is a measure of the pressure in your arteries as your heart beats. The second ("bottom") number is called the diastolic pressure. It is a measure of the pressure in your arteries as the heart relaxes. Blood pressure is classified into four stages. The following are the stages for adults who do not have a short-term serious  illness or a chronic condition. Systolic pressure and diastolic pressure are measured in a unit called mm Hg (millimeters of mercury).  Normal Systolic pressure: below 120. Diastolic pressure: below 80. Elevated Systolic pressure: 120-129. Diastolic pressure: below 80. Hypertension stage 1 Systolic pressure: 130-139. Diastolic pressure: 80-89. Hypertension stage 2 Systolic pressure: 140 or above. Diastolic pressure: 90 or above. You can have elevated blood pressure or hypertension even if only the systolic or only the diastolic number in your reading is higher than normal. Follow these instructions at home: Medicines Take over-the-counter and prescription medicines only as told by your health care provider. Tell your health care provider if you are having any side effects from blood pressure medicine. General instructions Check your blood pressure as often as recommended by your health care provider. Check your blood pressure at the same time every day. Take your monitor to the next appointment with your health care provider to make sure that: You are using it correctly. It provides accurate readings. Understand what your goal blood pressure numbers are. Keep all follow-up visits. This is important. General tips Your health care provider can suggest a reliable monitor that will meet your needs. There are several types of home blood pressure monitors. Choose a monitor that has an arm cuff. Do not choose a monitor that measures your blood pressure from your wrist or finger. Choose a cuff that wraps snugly, not too tight or too loose, around your upper arm. You should be able to fit only one finger between your arm and the cuff. You can buy a blood pressure monitor at most drugstores or online. Where to find more information American Heart Association: www.heart.org Contact a health care provider if: Your blood pressure is consistently high. Your blood pressure is suddenly low. Get  help right away if: Your systolic blood pressure is higher than 180. Your diastolic blood pressure is higher than 120. These symptoms may be an emergency. Get help right away. Call 911. Do not wait to see if the symptoms will go away. Do not drive yourself to the hospital. Summary Blood pressure is a measurement of how strongly your blood is pressing against the walls of your arteries. A blood pressure reading consists of a higher number over a lower number. Ideally, your blood pressure should be below 120/80. Check your blood pressure at the same time every day. Avoid caffeine, alcohol, smoking, and exercise for 30 minutes prior to checking your blood pressure.  These agents can affect the accuracy of the blood pressure reading. This information is not intended to replace advice given to you by your health care provider. Make sure you discuss any questions you have with your health care provider. Document Revised: 09/18/2020 Document Reviewed: 09/18/2020 Elsevier Patient Education  2024 [employer].

## 2022-11-25 DIAGNOSIS — I471 Supraventricular tachycardia, unspecified: Secondary | ICD-10-CM

## 2022-11-30 DIAGNOSIS — M722 Plantar fascial fibromatosis: Secondary | ICD-10-CM | POA: Diagnosis not present

## 2022-11-30 DIAGNOSIS — M25571 Pain in right ankle and joints of right foot: Secondary | ICD-10-CM | POA: Diagnosis not present

## 2022-11-30 DIAGNOSIS — M79671 Pain in right foot: Secondary | ICD-10-CM | POA: Diagnosis not present

## 2022-11-30 DIAGNOSIS — M25572 Pain in left ankle and joints of left foot: Secondary | ICD-10-CM | POA: Diagnosis not present

## 2022-12-02 ENCOUNTER — Telehealth: Payer: Self-pay | Admitting: Cardiovascular Disease

## 2022-12-02 NOTE — Telephone Encounter (Signed)
Patient identification verified by 2 forms. Marilynn Rail, RN   Called and spoke to patient  Patient states:   -monitor fell off, has had it for 7 days   -has a rash where monitor was placed   -has small rough bumps around monitor, no open sores, red and itchy  Informed patient will send to order provider and device clinic to see if she will need a new monitor or if okay to send back monitor with data obtained  Patient verbalized understanding, no questions at this time

## 2022-12-02 NOTE — Telephone Encounter (Signed)
Patient identification verified by 2 forms. Marilynn Rail, RN   Called and spoke to patient  Relayed provider message below  Informed patient:   -Provider will outreach with input/advisement once results available   -continue to monitor rash, keep area clean and dry   -outreach if develops fever, drainage/discharge at area of rash  Patient verbalized understanding, no questions at this time

## 2022-12-02 NOTE — Telephone Encounter (Signed)
Patient calling in because her heart monitor has come off, and she is unable to put it back on. She also states that she has broken out in rash real bad. She was able to wear it for 7 days. Please advise

## 2022-12-02 NOTE — Telephone Encounter (Signed)
I think 7 days will be sufficient.  Lets just see what the data on the monitor shows.

## 2022-12-03 ENCOUNTER — Other Ambulatory Visit: Payer: Self-pay

## 2022-12-03 ENCOUNTER — Encounter (HOSPITAL_BASED_OUTPATIENT_CLINIC_OR_DEPARTMENT_OTHER): Payer: Self-pay

## 2022-12-03 ENCOUNTER — Emergency Department (HOSPITAL_BASED_OUTPATIENT_CLINIC_OR_DEPARTMENT_OTHER): Payer: Medicaid Other | Admitting: Radiology

## 2022-12-03 ENCOUNTER — Emergency Department (HOSPITAL_BASED_OUTPATIENT_CLINIC_OR_DEPARTMENT_OTHER)
Admission: EM | Admit: 2022-12-03 | Discharge: 2022-12-03 | Disposition: A | Discharge status: Home / Self Care | Payer: Medicaid Other | Attending: Emergency Medicine | Admitting: Emergency Medicine

## 2022-12-03 DIAGNOSIS — Z981 Arthrodesis status: Secondary | ICD-10-CM | POA: Diagnosis not present

## 2022-12-03 DIAGNOSIS — I10 Essential (primary) hypertension: Secondary | ICD-10-CM | POA: Diagnosis not present

## 2022-12-03 DIAGNOSIS — Z79899 Other long term (current) drug therapy: Secondary | ICD-10-CM | POA: Diagnosis not present

## 2022-12-03 DIAGNOSIS — R200 Anesthesia of skin: Secondary | ICD-10-CM | POA: Diagnosis not present

## 2022-12-03 DIAGNOSIS — R7989 Other specified abnormal findings of blood chemistry: Secondary | ICD-10-CM | POA: Diagnosis not present

## 2022-12-03 DIAGNOSIS — R946 Abnormal results of thyroid function studies: Secondary | ICD-10-CM | POA: Diagnosis not present

## 2022-12-03 DIAGNOSIS — G5601 Carpal tunnel syndrome, right upper limb: Secondary | ICD-10-CM | POA: Insufficient documentation

## 2022-12-03 DIAGNOSIS — R079 Chest pain, unspecified: Secondary | ICD-10-CM | POA: Diagnosis not present

## 2022-12-03 DIAGNOSIS — R202 Paresthesia of skin: Secondary | ICD-10-CM | POA: Diagnosis not present

## 2022-12-03 DIAGNOSIS — R519 Headache, unspecified: Secondary | ICD-10-CM | POA: Insufficient documentation

## 2022-12-03 LAB — BASIC METABOLIC PANEL
Anion gap: 7 (ref 5–15)
BUN: 14 mg/dL (ref 6–20)
CO2: 26 mmol/L (ref 22–32)
Calcium: 8.9 mg/dL (ref 8.9–10.3)
Chloride: 104 mmol/L (ref 98–111)
Creatinine, Ser: 0.65 mg/dL (ref 0.44–1.00)
GFR, Estimated: >60 mL/min (ref >60)
Glucose, Bld: 82 mg/dL (ref 70–99)
Potassium: 3.8 mmol/L (ref 3.5–5.1)
Sodium: 137 mmol/L (ref 135–145)

## 2022-12-03 LAB — CBC
HCT: 36.9 % (ref 36.0–46.0)
Hemoglobin: 12.2 g/dL (ref 12.0–15.0)
MCH: 24.7 pg — ABNORMAL LOW (ref 26.0–34.0)
MCHC: 33.1 g/dL (ref 30.0–36.0)
MCV: 74.8 fL — ABNORMAL LOW (ref 80.0–100.0)
Platelets: 308 10*3/uL (ref 150–400)
RBC: 4.93 MIL/uL (ref 3.87–5.11)
RDW: 14.2 % (ref 11.5–15.5)
WBC: 6.1 10*3/uL (ref 4.0–10.5)
nRBC: 0 % (ref 0.0–0.2)

## 2022-12-03 LAB — TROPONIN I (HIGH SENSITIVITY)
Troponin I (High Sensitivity): 3 ng/L (ref <18)
Troponin I (High Sensitivity): 3 ng/L (ref <18)

## 2022-12-03 LAB — TSH: TSH: 5.293 u[IU]/mL — ABNORMAL HIGH (ref 0.350–4.500)

## 2022-12-03 MED ORDER — DROPERIDOL 2.5 MG/ML IJ SOLN
1.2500 mg | Freq: Once | INTRAMUSCULAR | Status: AC
  Administered 2022-12-03: 1.25 mg via INTRAVENOUS
  Filled 2022-12-03: qty 2

## 2022-12-03 MED ORDER — DEXAMETHASONE SODIUM PHOSPHATE 10 MG/ML IJ SOLN
10.0000 mg | Freq: Once | INTRAMUSCULAR | Status: AC
  Administered 2022-12-03: 10 mg via INTRAVENOUS
  Filled 2022-12-03: qty 1

## 2022-12-03 MED ORDER — DIAZEPAM 5 MG/ML IJ SOLN
2.5000 mg | Freq: Once | INTRAMUSCULAR | Status: AC
  Administered 2022-12-03: 2.5 mg via INTRAVENOUS
  Filled 2022-12-03: qty 2

## 2022-12-03 NOTE — Discharge Instructions (Signed)

## 2022-12-03 NOTE — ED Provider Notes (Signed)
[place] EMERGENCY DEPARTMENT AT [facility] Provider Note   CSN: [id number] Arrival date & time: 12/03/22  1232     History {Add pertinent medical, surgical, social history, OB history to HPI:1} Chief Complaint  Patient presents with   Chest Pain   Migraine    [name] is a 52 y.o. female who presents    Chest Pain Pain location:  Substernal area Pain quality: pressure   Pain radiates to:  Does not radiate Pain severity:  Mild Migraine Associated symptoms include chest pain.       Home Medications Prior to Admission medications   Medication Sig Start Date End Date Taking? Authorizing Provider  acetaminophen (TYLENOL) 500 MG tablet Take 2 tablets (1,000 mg total) by mouth every 6 (six) hours as needed for moderate pain or headache. Patient not taking: Reported on 11/22/2022 02/25/20   Roylene Reason, MD  Cholecalciferol (D 1000) 25 MCG (1000 UT) capsule Take 1,000 Units by mouth daily. 03/03/20   [provider]  cyanocobalamin 1000 MCG tablet Take 1,000 mcg by mouth daily.    [provider]  levothyroxine (SYNTHROID) 75 MCG tablet TAKE 1 TABLET(75 MCG) BY MOUTH DAILY 01/19/22   Reymundo Poll, MD  metoprolol tartrate (LOPRESSOR) 25 MG tablet Take 0.5 tablets (12.5 mg total) by mouth 2 (two) times daily. 11/04/21   Croitoru, Mihai, MD  topiramate (TOPAMAX) 50 MG tablet Take 1 tablet daily Patient not taking: Reported on 11/22/2022 03/03/22   Irene Limbo, FNP  azelastine (ASTELIN) 0.1 % nasal spray Place 1 spray into both nostrils 2 (two) times daily. Use in each nostril as directed Patient not taking: Reported on 03/05/2019 12/01/17 12/24/19  Zachery Dauer, NP  DULoxetine (CYMBALTA) 30 MG capsule Take 1 capsule (30 mg total) by mouth daily. 12/19/19 12/24/19  Seawell, Jaimie A, DO  gabapentin (NEURONTIN) 300 MG capsule TAKE 1 CAPSULE(300 MG) BY MOUTH TWICE DAILY Patient not taking: Reported on 08/14/2019 05/23/19 12/24/19  Dolan Amen, MD      Allergies    Bee venom, Bactrim [sulfamethoxazole-trimethoprim], Codeine, and Sulfamethoxazole-trimethoprim    Review of Systems   Review of Systems  Cardiovascular:  Positive for chest pain.    Physical Exam Updated Vital Signs BP 134/71   Pulse 67   Temp (!) 97.3 F (36.3 C)   Resp 15   Ht 5\' 6"  (1.676 m)   Wt 113.4 kg   LMP 03/31/2013 (Exact Date)   SpO2 100%   BMI 40.35 kg/m  Physical Exam Vitals and nursing note reviewed.  Constitutional:      General: She is not in acute distress.    Appearance: She is well-developed. She is not diaphoretic.  HENT:     Head: Normocephalic and atraumatic.     Mouth/Throat:     Mouth: Oropharynx is clear and moist.  Eyes:     General: No scleral icterus.    Extraocular Movements: EOM normal.     Conjunctiva/sclera: Conjunctivae normal.     Pupils: Pupils are equal, round, and reactive to light.     Comments: No horizontal, vertical or rotational nystagmus  Neck:     Comments: Full active and passive ROM without pain No midline or paraspinal tenderness No nuchal rigidity or meningeal signs Cardiovascular:     Rate and Rhythm: Normal rate and regular rhythm.  Pulmonary:     Effort: Pulmonary effort is normal. No respiratory distress.     Breath sounds: Normal breath sounds. No wheezing or rales.  Abdominal:     General: Bowel sounds are normal.     Palpations: Abdomen is soft.     Tenderness: There is no abdominal tenderness. There is no guarding or rebound.  Musculoskeletal:        General: Normal range of motion.     Right wrist: No swelling, deformity or snuff box tenderness. Normal range of motion.     Right hand: Normal range of motion. Normal strength. Normal sensation.     Cervical back: Normal range of motion and neck supple.     Comments: + Tinnel and Phalen R side  Lymphadenopathy:     Cervical: No cervical adenopathy.  Skin:    General: Skin is warm and dry.     Findings: No rash.   Neurological:     Mental Status: She is alert and oriented to person, place, and time.     Cranial Nerves: No cranial nerve deficit.     Motor: No abnormal muscle tone.     Coordination: Coordination normal.     Deep Tendon Reflexes: Reflexes are normal and symmetric.     Comments: Mental Status:  Alert, oriented, thought content appropriate. Speech fluent without evidence of aphasia. Able to follow 2 step commands without difficulty.  Cranial Nerves:  II:  Peripheral visual fields grossly normal, pupils equal, round, reactive to light III,IV, VI: ptosis not present, extra-ocular motions intact bilaterally  V,VII: smile symmetric, facial light touch sensation equal VIII: hearing grossly normal bilaterally  IX,X: midline uvula rise  XI: bilateral shoulder shrug equal and strong XII: midline tongue extension  Motor:  5/5 in upper and lower extremities bilaterally including strong and equal grip strength and dorsiflexion/plantar flexion Sensory: Pinprick and light touch normal in all extremities.  Deep Tendon Reflexes: 2+ and symmetric  Cerebellar: normal finger-to-nose with bilateral upper extremities Gait: normal gait and balance CV: distal pulses palpable throughout   Psychiatric:        Mood and Affect: Mood and affect normal.        Behavior: Behavior normal.        Thought Content: Thought content normal.        Judgment: Judgment normal.     ED Results / Procedures / Treatments   Labs (all labs ordered are listed, but only abnormal results are displayed) Labs Reviewed  CBC - Abnormal; Notable for the following components:      Result Value   MCV 74.8 (*)    MCH 24.7 (*)    All other components within normal limits  BASIC METABOLIC PANEL  TROPONIN I (HIGH SENSITIVITY)    EKG None  Radiology No results found.  Procedures Procedures  {Document cardiac monitor, telemetry assessment procedure when appropriate:1}  Medications Ordered in ED Medications - No data  to display  ED Course/ Medical Decision Making/ A&P   {   Click here for ABCD2, HEART and other calculatorsREFRESH Note before signing :1}                              Medical Decision Making Amount and/or Complexity of Data Reviewed Labs: ordered. Radiology: ordered.  Risk Prescription drug management.   ***  {Document critical care time when appropriate:1} {Document review of labs and clinical decision tools ie heart score, Chads2Vasc2 etc:1}  {Document your independent review of radiology images, and any outside records:1} {Document your discussion with family members, caretakers, and with consultants:1} {Document social determinants of health  affecting pt's care:1} {Document your decision making why or why not admission, treatments were needed:1} Final Clinical Impression(s) / ED Diagnoses Final diagnoses:  None    Rx / DC Orders ED Discharge Orders     None

## 2022-12-03 NOTE — ED Triage Notes (Signed)
Pt presents c/o R arm pain/hand numbness and central chest pressure since Monday. Also c/o migraine sx, light sensitivity, dizziness, nausea, pressure behind eyes and in sinus region.   Hx migraines, HTN

## 2022-12-07 DIAGNOSIS — M79671 Pain in right foot: Secondary | ICD-10-CM | POA: Diagnosis not present

## 2022-12-07 DIAGNOSIS — M25571 Pain in right ankle and joints of right foot: Secondary | ICD-10-CM | POA: Diagnosis not present

## 2022-12-07 DIAGNOSIS — M25572 Pain in left ankle and joints of left foot: Secondary | ICD-10-CM | POA: Diagnosis not present

## 2022-12-07 DIAGNOSIS — M722 Plantar fascial fibromatosis: Secondary | ICD-10-CM | POA: Diagnosis not present

## 2022-12-23 DIAGNOSIS — M25572 Pain in left ankle and joints of left foot: Secondary | ICD-10-CM | POA: Diagnosis not present

## 2022-12-23 DIAGNOSIS — M722 Plantar fascial fibromatosis: Secondary | ICD-10-CM | POA: Diagnosis not present

## 2022-12-23 DIAGNOSIS — M79671 Pain in right foot: Secondary | ICD-10-CM | POA: Diagnosis not present

## 2022-12-23 DIAGNOSIS — M25571 Pain in right ankle and joints of right foot: Secondary | ICD-10-CM | POA: Diagnosis not present

## 2022-12-28 DIAGNOSIS — M722 Plantar fascial fibromatosis: Secondary | ICD-10-CM | POA: Diagnosis not present

## 2022-12-28 DIAGNOSIS — S93491A Sprain of other ligament of right ankle, initial encounter: Secondary | ICD-10-CM | POA: Diagnosis not present

## 2022-12-28 DIAGNOSIS — M7671 Peroneal tendinitis, right leg: Secondary | ICD-10-CM | POA: Diagnosis not present

## 2023-01-06 ENCOUNTER — Other Ambulatory Visit: Payer: Self-pay | Admitting: Cardiovascular Disease

## 2023-01-06 DIAGNOSIS — M25571 Pain in right ankle and joints of right foot: Secondary | ICD-10-CM | POA: Diagnosis not present

## 2023-01-06 DIAGNOSIS — M25572 Pain in left ankle and joints of left foot: Secondary | ICD-10-CM | POA: Diagnosis not present

## 2023-01-06 DIAGNOSIS — M79671 Pain in right foot: Secondary | ICD-10-CM | POA: Diagnosis not present

## 2023-01-06 DIAGNOSIS — M722 Plantar fascial fibromatosis: Secondary | ICD-10-CM | POA: Diagnosis not present

## 2023-01-24 DIAGNOSIS — M79671 Pain in right foot: Secondary | ICD-10-CM | POA: Diagnosis not present

## 2023-01-24 DIAGNOSIS — M722 Plantar fascial fibromatosis: Secondary | ICD-10-CM | POA: Diagnosis not present

## 2023-01-24 DIAGNOSIS — M25572 Pain in left ankle and joints of left foot: Secondary | ICD-10-CM | POA: Diagnosis not present

## 2023-01-24 DIAGNOSIS — M25571 Pain in right ankle and joints of right foot: Secondary | ICD-10-CM | POA: Diagnosis not present

## 2023-01-31 DIAGNOSIS — M25572 Pain in left ankle and joints of left foot: Secondary | ICD-10-CM | POA: Diagnosis not present

## 2023-01-31 DIAGNOSIS — M79671 Pain in right foot: Secondary | ICD-10-CM | POA: Diagnosis not present

## 2023-01-31 DIAGNOSIS — M25571 Pain in right ankle and joints of right foot: Secondary | ICD-10-CM | POA: Diagnosis not present

## 2023-01-31 DIAGNOSIS — M722 Plantar fascial fibromatosis: Secondary | ICD-10-CM | POA: Diagnosis not present

## 2023-02-14 DIAGNOSIS — M722 Plantar fascial fibromatosis: Secondary | ICD-10-CM | POA: Diagnosis not present

## 2023-02-14 DIAGNOSIS — M25571 Pain in right ankle and joints of right foot: Secondary | ICD-10-CM | POA: Diagnosis not present

## 2023-02-14 DIAGNOSIS — M25572 Pain in left ankle and joints of left foot: Secondary | ICD-10-CM | POA: Diagnosis not present

## 2023-02-14 DIAGNOSIS — M79671 Pain in right foot: Secondary | ICD-10-CM | POA: Diagnosis not present

## 2023-02-21 DIAGNOSIS — M25572 Pain in left ankle and joints of left foot: Secondary | ICD-10-CM | POA: Diagnosis not present

## 2023-02-21 DIAGNOSIS — M722 Plantar fascial fibromatosis: Secondary | ICD-10-CM | POA: Diagnosis not present

## 2023-02-21 DIAGNOSIS — M79671 Pain in right foot: Secondary | ICD-10-CM | POA: Diagnosis not present

## 2023-02-21 DIAGNOSIS — M25571 Pain in right ankle and joints of right foot: Secondary | ICD-10-CM | POA: Diagnosis not present

## 2023-03-05 ENCOUNTER — Other Ambulatory Visit: Payer: Self-pay

## 2023-03-05 ENCOUNTER — Encounter (HOSPITAL_BASED_OUTPATIENT_CLINIC_OR_DEPARTMENT_OTHER): Payer: Self-pay

## 2023-03-05 ENCOUNTER — Emergency Department (HOSPITAL_BASED_OUTPATIENT_CLINIC_OR_DEPARTMENT_OTHER)
Admission: EM | Admit: 2023-03-05 | Discharge: 2023-03-05 | Disposition: A | Discharge status: Home / Self Care | Payer: Medicaid Other | Attending: Emergency Medicine | Admitting: Emergency Medicine

## 2023-03-05 DIAGNOSIS — Z20822 Contact with and (suspected) exposure to covid-19: Secondary | ICD-10-CM | POA: Insufficient documentation

## 2023-03-05 DIAGNOSIS — I10 Essential (primary) hypertension: Secondary | ICD-10-CM | POA: Diagnosis not present

## 2023-03-05 DIAGNOSIS — R519 Headache, unspecified: Secondary | ICD-10-CM | POA: Diagnosis present

## 2023-03-05 DIAGNOSIS — Z79899 Other long term (current) drug therapy: Secondary | ICD-10-CM | POA: Insufficient documentation

## 2023-03-05 DIAGNOSIS — R6889 Other general symptoms and signs: Secondary | ICD-10-CM | POA: Insufficient documentation

## 2023-03-05 DIAGNOSIS — R002 Palpitations: Secondary | ICD-10-CM | POA: Insufficient documentation

## 2023-03-05 LAB — URINALYSIS, ROUTINE W REFLEX MICROSCOPIC
Bacteria, UA: NONE SEEN
Bilirubin Urine: NEGATIVE
Glucose, UA: NEGATIVE mg/dL
Ketones, ur: NEGATIVE mg/dL
Leukocytes,Ua: NEGATIVE
Nitrite: NEGATIVE
Protein, ur: NEGATIVE mg/dL
Specific Gravity, Urine: 1.015 (ref 1.005–1.030)
pH: 6 (ref 5.0–8.0)

## 2023-03-05 LAB — RESP PANEL BY RT-PCR (RSV, FLU A&B, COVID)  RVPGX2
Influenza A by PCR: NEGATIVE
Influenza B by PCR: NEGATIVE
Resp Syncytial Virus by PCR: NEGATIVE
SARS Coronavirus 2 by RT PCR: NEGATIVE

## 2023-03-05 LAB — CBG MONITORING, ED: Glucose-Capillary: 110 mg/dL — ABNORMAL HIGH (ref 70–99)

## 2023-03-05 LAB — CBC
HCT: 34.3 % — ABNORMAL LOW (ref 36.0–46.0)
Hemoglobin: 11.2 g/dL — ABNORMAL LOW (ref 12.0–15.0)
MCH: 24.9 pg — ABNORMAL LOW (ref 26.0–34.0)
MCHC: 32.7 g/dL (ref 30.0–36.0)
MCV: 76.4 fL — ABNORMAL LOW (ref 80.0–100.0)
Platelets: 289 10*3/uL (ref 150–400)
RBC: 4.49 MIL/uL (ref 3.87–5.11)
RDW: 13.9 % (ref 11.5–15.5)
WBC: 5.5 10*3/uL (ref 4.0–10.5)
nRBC: 0 % (ref 0.0–0.2)

## 2023-03-05 LAB — BASIC METABOLIC PANEL
Anion gap: 9 (ref 5–15)
BUN: 12 mg/dL (ref 6–20)
CO2: 26 mmol/L (ref 22–32)
Calcium: 9 mg/dL (ref 8.9–10.3)
Chloride: 105 mmol/L (ref 98–111)
Creatinine, Ser: 0.5 mg/dL (ref 0.44–1.00)
GFR, Estimated: >60 mL/min (ref >60)
Glucose, Bld: 88 mg/dL (ref 70–99)
Potassium: 3.6 mmol/L (ref 3.5–5.1)
Sodium: 140 mmol/L (ref 135–145)

## 2023-03-05 NOTE — ED Triage Notes (Addendum)
Patient arrives with complaints of headache and dizziness that started today. Patient states that she does have a history of migraine as well. Took Excedrin which did give her some relief. Rates her headache a 4/10. She is also on Lopressor for elevated heart rate.

## 2023-03-05 NOTE — ED Provider Notes (Signed)
[place] EMERGENCY DEPARTMENT AT [facility] Provider Note   CSN: [id number] Arrival date & time: 03/05/23  1335     History  Chief Complaint  Patient presents with   Headache   Dizziness    [name] is a 53 y.o. female presented to ED complaining of jitteriness, mood lability, mild headache, dizziness.  Patient ports he developed a mild headache earlier this morning.  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  She says the headache was not any worse than normal.  In fact the headache appears of improved since earlier today.  She is noted however she was having "shakes and jitters" while at work today.  She said her blood pressure was higher than normal.  She felt that "something was just off".  She says about a week ago she had 7 days of cough and congestion, which have since resolved.  She does take metoprolol for palpitations and has been taking this medication regularly.  She currently has a very mild headache, does not want any medications.  She took Excedrin prior to coming to the ED.  HPI     Home Medications Prior to Admission medications   Medication Sig Start Date End Date Taking? Authorizing Provider  acetaminophen (TYLENOL) 500 MG tablet Take 2 tablets (1,000 mg total) by mouth every 6 (six) hours as needed for moderate pain or headache. Patient not taking: Reported on 11/22/2022 02/25/20   Roylene Reason, MD  Cholecalciferol (D 1000) 25 MCG (1000 UT) capsule Take 1,000 Units by mouth daily. 03/03/20   [provider]  cyanocobalamin 1000 MCG tablet Take 1,000 mcg by mouth daily.    [provider]  levothyroxine (SYNTHROID) 75 MCG tablet TAKE 1 TABLET(75 MCG) BY MOUTH DAILY 01/19/22   Reymundo Poll, MD  metoprolol tartrate (LOPRESSOR) 25 MG tablet TAKE 1/2 TABLET(12.5 MG) BY MOUTH TWICE DAILY 01/06/23   Croitoru, Mihai, MD  topiramate (TOPAMAX) 50 MG tablet  Take 1 tablet daily Patient not taking: Reported on 11/22/2022 03/03/22   Irene Limbo, FNP  azelastine (ASTELIN) 0.1 % nasal spray Place 1 spray into both nostrils 2 (two) times daily. Use in each nostril as directed Patient not taking: Reported on 03/05/2019 12/01/17 12/24/19  Zachery Dauer, NP  DULoxetine (CYMBALTA) 30 MG capsule Take 1 capsule (30 mg total) by mouth daily. 12/19/19 12/24/19  Seawell, Jaimie A, DO  gabapentin (NEURONTIN) 300 MG capsule TAKE 1 CAPSULE(300 MG) BY MOUTH TWICE DAILY Patient not taking: Reported on 08/14/2019 05/23/19 12/24/19  Dolan Amen, MD      Allergies    Bee venom, Bactrim [sulfamethoxazole-trimethoprim], Codeine, and Sulfamethoxazole-trimethoprim    Review of Systems   Review of Systems  Physical Exam Updated Vital Signs BP (!) 172/73 (BP Location: Right Arm)   Pulse 78   Temp (!) 97.2 F (36.2 C)   Resp 16   Ht 5\' 6"  (1.676 m)   Wt 113.4 kg   LMP 03/31/2013 (Exact Date)   SpO2 100%   BMI 40.35 kg/m  Physical Exam Constitutional:      General: She is not in acute distress. HENT:     Head: Normocephalic and atraumatic.  Eyes:     Conjunctiva/sclera: Conjunctivae normal.     Pupils: Pupils are equal, round, and reactive to light.  Cardiovascular:     Rate and Rhythm: Normal rate and regular rhythm.  Pulmonary:     Effort: Pulmonary effort  is normal. No respiratory distress.  Abdominal:     General: There is no distension.     Tenderness: There is no abdominal tenderness.  Skin:    General: Skin is warm and dry.  Neurological:     General: No focal deficit present.     Mental Status: She is alert. Mental status is at baseline.  Psychiatric:        Mood and Affect: Mood normal.        Behavior: Behavior normal.     ED Results / Procedures / Treatments   Labs (all labs ordered are listed, but only abnormal results are displayed) Labs Reviewed  CBC - Abnormal; Notable for the following components:      Result Value    Hemoglobin 11.2 (*)    HCT 34.3 (*)    MCV 76.4 (*)    MCH 24.9 (*)    All other components within normal limits  URINALYSIS, ROUTINE W REFLEX MICROSCOPIC - Abnormal; Notable for the following components:   Hgb urine dipstick SMALL (*)    All other components within normal limits  CBG MONITORING, ED - Abnormal; Notable for the following components:   Glucose-Capillary 110 (*)    All other components within normal limits  RESP PANEL BY RT-PCR (RSV, FLU A&B, COVID)  [id number]  BASIC METABOLIC PANEL    EKG None  Radiology No results found.  Procedures Procedures    Medications Ordered in ED Medications - No data to display  ED Course/ Medical Decision Making/ A&P                                 Medical Decision Making Amount and/or Complexity of Data Reviewed Labs: ordered.   Differential would include viral illness versus complex migraine or other type of migraine headache versus electrolyte derangement versus infection versus other  No red flag symptoms for subarachnoid hemorrhage, meningitis, stroke.  There is no emergent indication for LP or neuroimaging at this time.  Her headache is extremely minimal and she does not want medications for it.  I personally reviewed her labs and workup.  No significant electrolyte derangement.  No significant worsening anemia or leukocytosis.  Her EKG per my interpretation shows NSR with Qtc 252, no emergent findings. (Note ECG has not crossed over onto Muse but I reviewed physical copy)  Blood pressure is mildly elevated but I doubt hypertensive emergency.  Likely this is reactive to her not feeling well.  She will follow-up and her flu and COVID testing at home, but she is likely outside the acute infectious period at this time.  I think she is stable for discharge, and she is comfortable with this plan        Final Clinical Impression(s) / ED Diagnoses Final diagnoses:  Palpitations  Nonintractable headache, unspecified  chronicity pattern, unspecified headache type  Feeling unwell  Hypertension, unspecified type    Rx / DC Orders ED Discharge Orders     None         Terald Sleeper, MD 03/05/23 [phone]

## 2023-03-05 NOTE — Discharge Instructions (Addendum)
Please follow-up with your primary care clinic or your cardiology clinic for your high blood pressure and your symptoms.  Continue to take your normal blood pressure medication (metoprolol) and check your blood pressure once or twice a day, keeping a daily diary.    You can follow-up on the results of your flu and COVID testing at home.  They should be available within the next 2 or 3 hours.  If you test positive, it is likely that you had the symptoms last week.  You can be infectious or flu or COVID for up to 7 days from the start of your symptoms.  You should return to the ER if you have new or worsening symptoms, including lightheadedness, difficulty breathing, chest pressure, or any other emergency medical concerns.

## 2023-03-07 DIAGNOSIS — M722 Plantar fascial fibromatosis: Secondary | ICD-10-CM | POA: Diagnosis not present

## 2023-03-07 DIAGNOSIS — M79671 Pain in right foot: Secondary | ICD-10-CM | POA: Diagnosis not present

## 2023-03-07 DIAGNOSIS — M25571 Pain in right ankle and joints of right foot: Secondary | ICD-10-CM | POA: Diagnosis not present

## 2023-03-07 DIAGNOSIS — M25572 Pain in left ankle and joints of left foot: Secondary | ICD-10-CM | POA: Diagnosis not present

## 2023-03-15 DIAGNOSIS — M79671 Pain in right foot: Secondary | ICD-10-CM | POA: Diagnosis not present

## 2023-03-15 DIAGNOSIS — M722 Plantar fascial fibromatosis: Secondary | ICD-10-CM | POA: Diagnosis not present

## 2023-03-15 DIAGNOSIS — M25572 Pain in left ankle and joints of left foot: Secondary | ICD-10-CM | POA: Diagnosis not present

## 2023-03-15 DIAGNOSIS — M25571 Pain in right ankle and joints of right foot: Secondary | ICD-10-CM | POA: Diagnosis not present

## 2023-03-22 DIAGNOSIS — M79671 Pain in right foot: Secondary | ICD-10-CM | POA: Diagnosis not present

## 2023-03-22 DIAGNOSIS — M722 Plantar fascial fibromatosis: Secondary | ICD-10-CM | POA: Diagnosis not present

## 2023-03-22 DIAGNOSIS — M25571 Pain in right ankle and joints of right foot: Secondary | ICD-10-CM | POA: Diagnosis not present

## 2023-03-22 DIAGNOSIS — M25572 Pain in left ankle and joints of left foot: Secondary | ICD-10-CM | POA: Diagnosis not present

## 2023-03-29 DIAGNOSIS — M25572 Pain in left ankle and joints of left foot: Secondary | ICD-10-CM | POA: Diagnosis not present

## 2023-03-29 DIAGNOSIS — M79671 Pain in right foot: Secondary | ICD-10-CM | POA: Diagnosis not present

## 2023-03-29 DIAGNOSIS — M722 Plantar fascial fibromatosis: Secondary | ICD-10-CM | POA: Diagnosis not present

## 2023-03-29 DIAGNOSIS — M25571 Pain in right ankle and joints of right foot: Secondary | ICD-10-CM | POA: Diagnosis not present

## 2023-04-12 DIAGNOSIS — M25572 Pain in left ankle and joints of left foot: Secondary | ICD-10-CM | POA: Diagnosis not present

## 2023-04-12 DIAGNOSIS — M25571 Pain in right ankle and joints of right foot: Secondary | ICD-10-CM | POA: Diagnosis not present

## 2023-04-12 DIAGNOSIS — M722 Plantar fascial fibromatosis: Secondary | ICD-10-CM | POA: Diagnosis not present

## 2023-04-12 DIAGNOSIS — M79671 Pain in right foot: Secondary | ICD-10-CM | POA: Diagnosis not present

## 2023-04-26 DIAGNOSIS — M25572 Pain in left ankle and joints of left foot: Secondary | ICD-10-CM | POA: Diagnosis not present

## 2023-04-26 DIAGNOSIS — M79671 Pain in right foot: Secondary | ICD-10-CM | POA: Diagnosis not present

## 2023-04-26 DIAGNOSIS — M722 Plantar fascial fibromatosis: Secondary | ICD-10-CM | POA: Diagnosis not present

## 2023-04-26 DIAGNOSIS — M25571 Pain in right ankle and joints of right foot: Secondary | ICD-10-CM | POA: Diagnosis not present

## 2023-05-02 DIAGNOSIS — M79671 Pain in right foot: Secondary | ICD-10-CM | POA: Diagnosis not present

## 2023-05-02 DIAGNOSIS — M722 Plantar fascial fibromatosis: Secondary | ICD-10-CM | POA: Diagnosis not present

## 2023-05-02 DIAGNOSIS — M25572 Pain in left ankle and joints of left foot: Secondary | ICD-10-CM | POA: Diagnosis not present

## 2023-05-02 DIAGNOSIS — M25571 Pain in right ankle and joints of right foot: Secondary | ICD-10-CM | POA: Diagnosis not present

## 2023-05-09 DIAGNOSIS — M79671 Pain in right foot: Secondary | ICD-10-CM | POA: Diagnosis not present

## 2023-05-09 DIAGNOSIS — M25571 Pain in right ankle and joints of right foot: Secondary | ICD-10-CM | POA: Diagnosis not present

## 2023-05-09 DIAGNOSIS — M722 Plantar fascial fibromatosis: Secondary | ICD-10-CM | POA: Diagnosis not present

## 2023-05-09 DIAGNOSIS — M25572 Pain in left ankle and joints of left foot: Secondary | ICD-10-CM | POA: Diagnosis not present

## 2023-05-15 DIAGNOSIS — E039 Hypothyroidism, unspecified: Secondary | ICD-10-CM | POA: Insufficient documentation

## 2023-05-15 DIAGNOSIS — R519 Headache, unspecified: Secondary | ICD-10-CM | POA: Insufficient documentation

## 2023-05-15 DIAGNOSIS — Z87891 Personal history of nicotine dependence: Secondary | ICD-10-CM | POA: Insufficient documentation

## 2023-05-15 DIAGNOSIS — R9431 Abnormal electrocardiogram [ECG] [EKG]: Secondary | ICD-10-CM | POA: Diagnosis not present

## 2023-05-16 ENCOUNTER — Emergency Department (HOSPITAL_BASED_OUTPATIENT_CLINIC_OR_DEPARTMENT_OTHER)
Admission: EM | Admit: 2023-05-16 | Discharge: 2023-05-16 | Disposition: A | Discharge status: Home / Self Care | Attending: Emergency Medicine | Admitting: Emergency Medicine

## 2023-05-16 ENCOUNTER — Encounter (HOSPITAL_BASED_OUTPATIENT_CLINIC_OR_DEPARTMENT_OTHER): Payer: Self-pay | Admitting: *Deleted

## 2023-05-16 ENCOUNTER — Other Ambulatory Visit: Payer: Self-pay

## 2023-05-16 DIAGNOSIS — R519 Headache, unspecified: Secondary | ICD-10-CM

## 2023-05-16 HISTORY — DX: Anxiety disorder, unspecified: F41.9

## 2023-05-16 MED ORDER — SODIUM CHLORIDE 0.9 % IV BOLUS
1000.0000 mL | Freq: Once | INTRAVENOUS | Status: AC
  Administered 2023-05-16: 1000 mL via INTRAVENOUS

## 2023-05-16 MED ORDER — PROCHLORPERAZINE EDISYLATE 10 MG/2ML IJ SOLN
10.0000 mg | Freq: Once | INTRAMUSCULAR | Status: AC
  Administered 2023-05-16: 10 mg via INTRAVENOUS
  Filled 2023-05-16: qty 2

## 2023-05-16 MED ORDER — KETOROLAC TROMETHAMINE 15 MG/ML IJ SOLN
15.0000 mg | Freq: Once | INTRAMUSCULAR | Status: AC
  Administered 2023-05-16: 15 mg via INTRAVENOUS
  Filled 2023-05-16: qty 1

## 2023-05-16 MED ORDER — DIPHENHYDRAMINE HCL 50 MG/ML IJ SOLN
25.0000 mg | Freq: Once | INTRAMUSCULAR | Status: AC
  Administered 2023-05-16: 25 mg via INTRAVENOUS
  Filled 2023-05-16: qty 1

## 2023-05-16 NOTE — ED Provider Notes (Signed)
 [facility] Emergency Department Provider Note MRN:  [id number]  Arrival date & time: 05/16/23     Chief Complaint   Headache   History of Present Illness   [name] is a 53 y.o. year-old female with a history of migraines presenting to the ED with chief complaint of headache.  Gradual onset headache over the past day or 2.  Has been having increased pain to the left shoulder and left lateral neck.  Explains that she had surgery to her neck years ago and this has left her with some chronic pain to these areas but the pain seems to be more frequent recently.  She thinks that the pain in the neck is triggering some pain in her head on occasion.  Also feeling very jittery and stressed recently.  2 kids graduating college and her daughter has a new baby.  Review of Systems  A thorough review of systems was obtained and all systems are negative except as noted in the HPI and PMH.   Patient's Health History    Past Medical History:  Diagnosis Date   Anxiety    Arthritis    B12 deficiency    Back pain    Elevated blood pressure reading    Fibroids, submucosal    History of blood transfusion 02/19/2011   [facility] - ? 2-3 units transfused   Hypothyroidism    Iron  deficiency anemia    Vitamin D  deficiency     Past Surgical History:  Procedure Laterality Date   ANTERIOR CERVICAL DECOMPRESSION/DISCECTOMY FUSION 4 LEVELS N/A 04/26/2019   Procedure: ANTERIOR CERVICAL DECOMPRESSION FUSION CERVICAL 4-5, CERVICAL 5-6, CERVICAL 6-7 WITH INSTRUMENTATION AND ALLOGRAFT;  Surgeon: Virl Grimes, MD;  Location: [facility] OR;  Service: Orthopedics;  Laterality: N/A;   APPENDECTOMY     BILATERAL SALPINGECTOMY Bilateral 04/16/2013   Procedure: BILATERAL SALPINGECTOMY;  Surgeon: Lenord Radon, MD;  Location: [facility] ORS;  Service: Gynecology;  Laterality: Bilateral;   CESAREAN SECTION  1994, 2002   DILATION AND CURETTAGE OF UTERUS N/A 03/31/2012   Procedure: DILATATION AND  CURETTAGE;  Surgeon: Ana Balling, MD;  Location: [facility] ORS;  Service: Gynecology;  Laterality: N/A;   LAPAROSCOPIC LYSIS OF ADHESIONS N/A 03/31/2012   Procedure: LAPAROSCOPIC LYSIS OF ADHESIONS;  Surgeon: Ana Balling, MD;  Location: [facility];  Service: Gynecology;  Laterality: N/A;   LAPAROSCOPY N/A 03/31/2012   Procedure: LAPAROSCOPY OPERATIVE;  Surgeon: Ana Balling, MD;  Location: [facility];  Service: Gynecology;  Laterality: N/A;   NOVASURE ABLATION N/A 03/31/2012   Procedure: NOVASURE ABLATION;  Surgeon: Ana Balling, MD;  Location: [facility];  Service: Gynecology;  Laterality: N/A;   ROBOTIC ASSISTED TOTAL HYSTERECTOMY N/A 04/16/2013   Procedure: ROBOTIC ASSISTED TOTAL HYSTERECTOMY;  Surgeon: Lenord Radon, MD;  Location: [facility];  Service: Gynecology;  Laterality: N/A;   TUBAL LIGATION  2002   WISDOM TOOTH EXTRACTION      Family History  Problem Relation Age of Onset   Hypertension Mother    Seizures Father    Sudden death Father    Fibroids Sister    Breast cancer Neg Hx    Colon cancer Neg Hx    Colon polyps Neg Hx    Esophageal cancer Neg Hx    Rectal cancer Neg Hx    Stomach cancer Neg Hx     Social History   Socioeconomic History   Marital status: Single    Spouse name: Not on file   Number of  children: Not on file   Years of education: Not on file   Highest education level: Not on file  Occupational History   Not on file  Tobacco Use   Smoking status: Former    Current packs/day: 0.00    Types: Cigarettes    Quit date: 01/21/2011    Years since quitting: 12.3   Smokeless tobacco: Never  Vaping Use   Vaping status: Never Used  Substance and Sexual Activity   Alcohol use: Yes    Comment: social occasions   Drug use: No   Sexual activity: Not Currently    Birth control/protection: Condom, Surgical  Other Topics Concern   Not on file  Social History Narrative   Not on file   Social Drivers of Health   Financial Resource Strain: Medium Risk (11/26/2021)   Overall  Financial Resource Strain (CARDIA)    Difficulty of Paying Living Expenses: Somewhat hard  Food Insecurity: No Food Insecurity (11/26/2021)   Hunger Vital Sign    Worried About Running Out of Food in the Last Year: Never true    Ran Out of Food in the Last Year: Never true  Transportation Needs: No Transportation Needs (11/26/2021)   PRAPARE - Administrator, Civil Service (Medical): No    Lack of Transportation (Non-Medical): No  Physical Activity: Not on file  Stress: No Stress Concern Present (01/01/2022)   [employer] of Occupational Health - Occupational Stress Questionnaire    Feeling of Stress : Not at all  Social Connections: Moderately Isolated (11/26/2021)   Social Connection and Isolation Panel [NHANES]    Frequency of Communication with Friends and Family: More than three times a week    Frequency of Social Gatherings with Friends and Family: Once a week    Attends Religious Services: More than 4 times per year    Active Member of [employer] or Organizations: No    Attends Banker Meetings: Never    Marital Status: Never married  Intimate Partner Violence: Not At Risk (11/26/2021)   Humiliation, Afraid, Rape, and Kick questionnaire    Fear of Current or Ex-Partner: No    Emotionally Abused: No    Physically Abused: No    Sexually Abused: No     Physical Exam   Vitals:   05/16/23 0006  BP: (!) 179/77  Pulse: 70  Resp: 20  Temp: 98.4 F (36.9 C)  SpO2: 99%    CONSTITUTIONAL: Well-appearing, NAD NEURO/PSYCH:  Alert and oriented x 3, normal and symmetric strength and sensation, normal coordination, normal speech EYES:  eyes equal and reactive ENT/NECK:  no LAD, no JVD CARDIO: Regular rate, well-perfused, normal S1 and S2 PULM:  CTAB no wheezing or rhonchi GI/GU:  non-distended, non-tender MSK/SPINE:  No gross deformities, no edema SKIN:  no rash, atraumatic   *Additional and/or pertinent findings included in MDM below  Diagnostic and  Interventional Summary    EKG Interpretation Date/Time:  Monday May 16 2023 01:31:39 EDT Ventricular Rate:  60 PR Interval:  162 QRS Duration:  86 QT Interval:  474 QTC Calculation: 474 R Axis:   43  Text Interpretation: Sinus rhythm Probable anteroseptal infarct, old Confirmed by Gwenetta Lennert [phone]) on 05/16/2023 1:39:30 AM       Labs Reviewed - No data to display  No orders to display    Medications  sodium chloride  0.9 % bolus 1,000 mL (1,000 mLs Intravenous New Bag/Given 05/16/23 0141)  ketorolac  (TORADOL ) 15 MG/ML injection 15 mg (15  mg Intravenous Given 05/16/23 0141)  diphenhydrAMINE  (BENADRYL ) injection 25 mg (25 mg Intravenous Given 05/16/23 0142)  prochlorperazine (COMPAZINE) injection 10 mg (10 mg Intravenous Given 05/16/23 0142)     Procedures  /  Critical Care Procedures  ED Course and Medical Decision Making  Initial Impression and Ddx Favoring cervicogenic headache.  Per chart review patient had a surgery on the cervical spine back in April 2021 for left-sided cervical radiculopathy and spinal stenosis.  This is left her with chronic pain in this area.  A bit worse over the past several weeks, has been thinking about trying to follow-up with her surgeon about her ongoing symptoms.  She may be having cervicogenic headaches waited to this pain.  She is exhibiting no signs or symptoms of myelopathy, no numbness or weakness to the arms or legs, no bowel or bladder dysfunction, with reassuring neurological exam I also highly doubt vascular emergencies such as carotid artery dissection.  Favoring cervicogenic headache versus migraine type headache, has had this headache in the past, feels similar.  Will reassess after migraine cocktail.  Past medical/surgical history that increases complexity of ED encounter: History of cervical radiculopathy/myelopathy  Interpretation of Diagnostics I personally reviewed the EKG and my interpretation is as follows: Sinus rhythm without  concerning changes    Patient Reassessment and Ultimate Disposition/Management     Patient feeling much better and blood pressures improved and she would like to go home.  Nothing to suggest emergent process, appropriate for discharge.  Patient management required discussion with the following services or consulting groups:  None  Complexity of Problems Addressed Acute illness or injury that poses threat of life of bodily function  Additional Data Reviewed and Analyzed Further history obtained from: Recent Consult notes and Prior labs/imaging results  Additional Factors Impacting ED Encounter Risk Consideration of hospitalization  Merrick Abe. Harless Lien, MD [facility] Emergency Medicine [facility] [email or web address]  Final Clinical Impressions(s) / ED Diagnoses     ICD-10-CM   1. Acute nonintractable headache, unspecified headache type  R51.9       ED Discharge Orders     None        Discharge Instructions Discussed with and Provided to Patient:     Discharge Instructions      You were evaluated in the Emergency Department and after careful evaluation, we did not find any emergent condition requiring admission or further testing in the hospital.  Your exam/testing today was overall reassuring.  Recommend follow-up with your regular doctors to discuss your symptoms.  Please return to the Emergency Department if you experience any worsening of your condition.  Thank you for allowing us  to be a part of your care.        Edson Graces, MD 05/16/23 [phone]

## 2023-05-16 NOTE — ED Triage Notes (Addendum)
 C/o "migraine" headache. States this started on Friday. C/o nausea and also feeling anxious. States she feels like she cannot relax.  Took an excedrin migraine at 2200. States this did not relieve her pain. C/o of light sensitivity.  Hx of migraines. States pain is frontal and to the left side of her head to her ear.  Pt states she has a ride home.

## 2023-05-16 NOTE — Discharge Instructions (Signed)
 You were evaluated in the Emergency Department and after careful evaluation, we did not find any emergent condition requiring admission or further testing in the hospital.  Your exam/testing today was overall reassuring.  Recommend follow-up with your regular doctors to discuss your symptoms.  Please return to the Emergency Department if you experience any worsening of your condition.  Thank you for allowing [place] to be a part of your care.

## 2023-05-17 DIAGNOSIS — M79671 Pain in right foot: Secondary | ICD-10-CM | POA: Diagnosis not present

## 2023-05-17 DIAGNOSIS — M25571 Pain in right ankle and joints of right foot: Secondary | ICD-10-CM | POA: Diagnosis not present

## 2023-05-17 DIAGNOSIS — M722 Plantar fascial fibromatosis: Secondary | ICD-10-CM | POA: Diagnosis not present

## 2023-05-17 DIAGNOSIS — M25572 Pain in left ankle and joints of left foot: Secondary | ICD-10-CM | POA: Diagnosis not present

## 2023-05-19 ENCOUNTER — Ambulatory Visit: Payer: Self-pay | Admitting: Student

## 2023-05-19 ENCOUNTER — Encounter: Payer: Self-pay | Admitting: Student

## 2023-05-19 VITALS — BP 148/83 | HR 68 | Temp 98.4°F | Ht 65.0 in | Wt 254.6 lb

## 2023-05-19 DIAGNOSIS — D510 Vitamin B12 deficiency anemia due to intrinsic factor deficiency: Secondary | ICD-10-CM | POA: Diagnosis not present

## 2023-05-19 DIAGNOSIS — G4486 Cervicogenic headache: Secondary | ICD-10-CM

## 2023-05-19 DIAGNOSIS — E063 Autoimmune thyroiditis: Secondary | ICD-10-CM | POA: Diagnosis not present

## 2023-05-19 DIAGNOSIS — I10 Essential (primary) hypertension: Secondary | ICD-10-CM

## 2023-05-19 DIAGNOSIS — Z131 Encounter for screening for diabetes mellitus: Secondary | ICD-10-CM | POA: Diagnosis not present

## 2023-05-19 DIAGNOSIS — Z0000 Encounter for general adult medical examination without abnormal findings: Secondary | ICD-10-CM

## 2023-05-19 DIAGNOSIS — E559 Vitamin D deficiency, unspecified: Secondary | ICD-10-CM | POA: Diagnosis not present

## 2023-05-19 DIAGNOSIS — G959 Disease of spinal cord, unspecified: Secondary | ICD-10-CM | POA: Diagnosis not present

## 2023-05-19 DIAGNOSIS — R000 Tachycardia, unspecified: Secondary | ICD-10-CM | POA: Diagnosis not present

## 2023-05-19 DIAGNOSIS — D508 Other iron deficiency anemias: Secondary | ICD-10-CM | POA: Diagnosis not present

## 2023-05-19 MED ORDER — AMLODIPINE BESYLATE 5 MG PO TABS: 5.0000 mg | ORAL_TABLET | Freq: Every day | ORAL | 11 refills | Status: AC

## 2023-05-19 NOTE — Progress Notes (Signed)
 CC: ED f/u  HPI:  [name] is a 53 y.o. female living with a history stated below and presents today for ED f/u. Please see problem based assessment and plan for additional details.  Past Medical History:  Diagnosis Date   Anxiety    Arthritis    B12 deficiency    Back pain    Elevated blood pressure reading    Fibroids, submucosal    History of blood transfusion 02/19/2011   [facility] - ? 2-3 units transfused   Hypothyroidism    Iron  deficiency anemia    Vitamin D  deficiency     Current Outpatient Medications on File Prior to Visit  Medication Sig Dispense Refill   levothyroxine  (SYNTHROID ) 75 MCG tablet TAKE 1 TABLET(75 MCG) BY MOUTH DAILY 30 tablet 11   metoprolol  tartrate (LOPRESSOR ) 25 MG tablet TAKE 1/2 TABLET(12.5 MG) BY MOUTH TWICE DAILY 90 tablet 3   No current facility-administered medications on file prior to visit.    Family History  Problem Relation Age of Onset   Hypertension Mother    Seizures Father    Sudden death Father    Fibroids Sister    Breast cancer Neg Hx    Colon cancer Neg Hx    Colon polyps Neg Hx    Esophageal cancer Neg Hx    Rectal cancer Neg Hx    Stomach cancer Neg Hx     Social History   Socioeconomic History   Marital status: Single    Spouse name: Not on file   Number of children: Not on file   Years of education: Not on file   Highest education level: Not on file  Occupational History   Not on file  Tobacco Use   Smoking status: Former    Current packs/day: 0.00    Types: Cigarettes    Quit date: 01/21/2011    Years since quitting: 12.3   Smokeless tobacco: Never  Vaping Use   Vaping status: Never Used  Substance and Sexual Activity   Alcohol use: Yes    Comment: social occasions   Drug use: No   Sexual activity: Not Currently    Birth control/protection: Condom, Surgical  Other Topics Concern   Not on file  Social History Narrative   Not on file   Social Drivers of Health   Financial Resource  Strain: Medium Risk (11/26/2021)   Overall Financial Resource Strain (CARDIA)    Difficulty of Paying Living Expenses: Somewhat hard  Food Insecurity: No Food Insecurity (11/26/2021)   Hunger Vital Sign    Worried About Running Out of Food in the Last Year: Never true    Ran Out of Food in the Last Year: Never true  Transportation Needs: No Transportation Needs (11/26/2021)   PRAPARE - Administrator, Civil Service (Medical): No    Lack of Transportation (Non-Medical): No  Physical Activity: Not on file  Stress: No Stress Concern Present (01/01/2022)   [employer] of Occupational Health - Occupational Stress Questionnaire    Feeling of Stress : Not at all  Social Connections: Moderately Isolated (11/26/2021)   Social Connection and Isolation Panel [NHANES]    Frequency of Communication with Friends and Family: More than three times a week    Frequency of Social Gatherings with Friends and Family: Once a week    Attends Religious Services: More than 4 times per year    Active Member of [employer] or Organizations: No    Attends Club or  Organization Meetings: Never    Marital Status: Never married  Intimate Partner Violence: Not At Risk (11/26/2021)   Humiliation, Afraid, Rape, and Kick questionnaire    Fear of Current or Ex-Partner: No    Emotionally Abused: No    Physically Abused: No    Sexually Abused: No    Review of Systems: ROS negative except for what is noted on the assessment and plan.  Vitals:   05/19/23 0848 05/19/23 0919  BP: (!) 157/85 (!) 148/83  Pulse: 70 68  Temp: 98.4 F (36.9 C)   TempSrc: Oral   SpO2: 97%   Weight: 254 lb 9.6 oz (115.5 kg)   Height: 5\' 5"  (1.651 m)    Physical Exam: Constitutional: alert, sitting upright on exam table comfortably, in no acute distress HENT: normocephalic and atraumatic Cardiovascular: regular rate and rhythm Pulmonary/Chest: normal work of breathing on room air Neurological: alert & oriented x  3   Assessment & Plan:   Hashimoto's thyroiditis Taking levothyroxine  75 mcg daily. Last TSH elevated 5.293 in 11/2022, before was normal TSH and free T4.   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 HR stable today.   Pernicious anemia States taking OTC vitamin B12 daily. Hx of pernicious anemia with elevated IF). Was receiving injections but level stable after transitioned to oral in 2023.   Plan -Repeat B12 level   Iron  deficiency anemia Hx of IDA. Hx of total hysterectomy. Iron  studies normal in 2023. No longer on iron  supplementation.   Plan -Iron  studies today   Vitamin D  deficiency Low vitamin D . Started taking supplementation for few days but has not been consistent.   Plan -Repeat vitamin D  level   Primary hypertension BP elevated on repeat 148/83. Prior visits to confirm HTN. Already on metoprolol  12.5 mg BID for tachycardia followed by cardiology. Agreeable to starting amlodipine .   Plan -Continue metoprolol  -Start amlodipine  5 mg daily  -BP recheck in 2 weeks, titrate up or may need additional agent  Healthcare maintenance -Screening A1c today. Last A1c 5.6 in 07/2021.  Cervical myelopathy s/p cervical decompression/discectomy fusion (HCC) Hx of ACDF of C4-7 in 04/2019. 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 Was trial on duloxetine  in past but stopped by patient. No radiculopathy down b/l UE. Denies numbness/tingling of fingers. Mainly tension and tenderness of neck and shoulders. PT has helped in past and patient rather do that instead of medication.   Plan -Referral for PT sent today    Patient discussed with Dr. Juanna Norman, D.O. [facility] Internal Medicine, PGY-2 Phone: [phone] Date 05/19/2023 Time 1:29 PM

## 2023-05-19 NOTE — Assessment & Plan Note (Addendum)
 Hx of IDA. Hx of total hysterectomy. Iron  studies normal in 2023. No longer on iron  supplementation.   Plan -Iron  studies today

## 2023-05-19 NOTE — Assessment & Plan Note (Signed)
 BP elevated on repeat 148/83. Prior visits to confirm HTN. Already on metoprolol  12.5 mg BID for tachycardia followed by cardiology. Agreeable to starting amlodipine .   Plan -Continue metoprolol  -Start amlodipine  5 mg daily  -BP recheck in 2 weeks, titrate up or may need additional agent

## 2023-05-19 NOTE — Assessment & Plan Note (Signed)
 Taking levothyroxine  75 mcg daily. Last TSH elevated 5.293 in 11/2022, before was normal TSH and free T4.   Plan -Repeat TSH and free T4 -Continue levothyroxine  75 mcg daily, adjust pending on repeat labs

## 2023-05-19 NOTE — Assessment & Plan Note (Signed)
 Low vitamin D . Started taking supplementation for few days but has not been consistent.   Plan -Repeat vitamin D  level

## 2023-05-19 NOTE — Assessment & Plan Note (Addendum)
 States taking OTC vitamin B12 daily. Hx of pernicious anemia with elevated IF). Was receiving injections but level stable after transitioned to oral in 2023.   Plan -Repeat B12 level

## 2023-05-19 NOTE — Assessment & Plan Note (Addendum)
 Hx of ACDF of C4-7 in 04/2019. 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 Was trial on duloxetine  in past but stopped by patient. No radiculopathy down b/l UE. Denies numbness/tingling of fingers. Mainly tension and tenderness of neck and shoulders. PT has helped in past and patient rather do that instead of medication.   Plan -Referral for PT sent today

## 2023-05-19 NOTE — Assessment & Plan Note (Signed)
 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 HR stable today.

## 2023-05-19 NOTE — Patient Instructions (Addendum)
 Thank you, Ms.Anmol R Zaldivar for allowing us  to provide your care today. Today we discussed   -Blood work today to check: --vitamin B12 --vitamin D  --screen for diabetes --iron  levels --thyroid  levels   -Blood pressure high today, continue metoprolol  twice a day -Will start Amlodipine  5 mg once a day -Come back in 2-3 weeks for nurse blood pressure check     -New referral for physical therapy for your neck strain/pain  I have ordered the following labs for you: Lab Orders         T4, Free         TSH         Vitamin D  (25 hydroxy)         Vitamin B12         Hemoglobin A1c         Iron  and IBC ([id number])         Ferritin      Referrals ordered today:  Referral Orders         Ambulatory referral to Physical Therapy      I have ordered the following medication/changed the following medications:  Start the following medications: Meds ordered this encounter  Medications   amLODipine  (NORVASC ) 5 MG tablet    Sig: Take 1 tablet (5 mg total) by mouth daily.    Dispense:  30 tablet    Refill:  11     Follow up:  2 week nurse blood pressure visit, 2-3 months office visit     Should you have any questions or concerns please call the internal medicine clinic at [phone].    Kaela Beitz, D.O. [facility]

## 2023-05-19 NOTE — Assessment & Plan Note (Signed)
-  Screening A1c today. Last A1c 5.6 in 07/2021.

## 2023-05-24 DIAGNOSIS — M25572 Pain in left ankle and joints of left foot: Secondary | ICD-10-CM | POA: Diagnosis not present

## 2023-05-24 DIAGNOSIS — M25571 Pain in right ankle and joints of right foot: Secondary | ICD-10-CM | POA: Diagnosis not present

## 2023-05-24 DIAGNOSIS — M722 Plantar fascial fibromatosis: Secondary | ICD-10-CM | POA: Diagnosis not present

## 2023-05-24 DIAGNOSIS — M79671 Pain in right foot: Secondary | ICD-10-CM | POA: Diagnosis not present

## 2023-05-25 LAB — TSH: TSH: 6.19 u[IU]/mL — ABNORMAL HIGH (ref 0.450–4.500)

## 2023-05-25 LAB — FERRITIN: Ferritin: 10 ng/mL — ABNORMAL LOW (ref 15–150)

## 2023-05-25 LAB — IRON AND TIBC
Iron Saturation: 12 % — ABNORMAL LOW (ref 15–55)
Iron: 55 ug/dL (ref 27–159)
Total Iron Binding Capacity: 467 ug/dL — ABNORMAL HIGH (ref 250–450)
UIBC: 412 ug/dL (ref 131–425)

## 2023-05-25 LAB — T4, FREE: Free T4: 1.28 ng/dL (ref 0.82–1.77)

## 2023-05-25 LAB — HEMOGLOBIN A1C
Est. average glucose Bld gHb Est-mCnc: 117 mg/dL
Hgb A1c MFr Bld: 5.7 % — ABNORMAL HIGH (ref 4.8–5.6)

## 2023-05-25 LAB — VITAMIN D 25 HYDROXY (VIT D DEFICIENCY, FRACTURES): Vit D, 25-Hydroxy: 12.4 ng/mL — ABNORMAL LOW (ref 30.0–100.0)

## 2023-05-25 LAB — VITAMIN B12

## 2023-05-26 MED ORDER — FERROUS SULFATE 325 (65 FE) MG PO TABS: 325.0000 mg | ORAL_TABLET | ORAL | 3 refills | Status: DC

## 2023-05-26 MED ORDER — LEVOTHYROXINE SODIUM 88 MCG PO TABS: 88.0000 ug | ORAL_TABLET | Freq: Every day | ORAL | 11 refills | Status: AC

## 2023-05-26 NOTE — Addendum Note (Signed)
 Addended by: Jearldine Mina on: 05/26/2023 01:52 PM   Modules accepted: Orders

## 2023-05-27 ENCOUNTER — Other Ambulatory Visit: Payer: Self-pay | Admitting: Student

## 2023-05-27 DIAGNOSIS — D508 Other iron deficiency anemias: Secondary | ICD-10-CM

## 2023-05-27 NOTE — Progress Notes (Signed)
 Internal Medicine Clinic Attending  Case discussed with the resident at the time of the visit.  We reviewed the resident's history and exam and pertinent patient test results.  I agree with the assessment, diagnosis, and plan of care documented in the resident's note.

## 2023-06-01 ENCOUNTER — Encounter: Payer: Self-pay | Admitting: Physical Therapy

## 2023-06-01 ENCOUNTER — Ambulatory Visit: Admitting: Physical Therapy

## 2023-06-01 ENCOUNTER — Ambulatory Visit: Attending: Internal Medicine | Admitting: Physical Therapy

## 2023-06-01 ENCOUNTER — Encounter: Payer: Self-pay | Admitting: Student

## 2023-06-01 ENCOUNTER — Other Ambulatory Visit: Payer: Self-pay

## 2023-06-01 ENCOUNTER — Ambulatory Visit: Admitting: *Deleted

## 2023-06-01 DIAGNOSIS — G959 Disease of spinal cord, unspecified: Secondary | ICD-10-CM | POA: Insufficient documentation

## 2023-06-01 DIAGNOSIS — M79671 Pain in right foot: Secondary | ICD-10-CM | POA: Diagnosis not present

## 2023-06-01 DIAGNOSIS — M25572 Pain in left ankle and joints of left foot: Secondary | ICD-10-CM | POA: Diagnosis not present

## 2023-06-01 DIAGNOSIS — G4486 Cervicogenic headache: Secondary | ICD-10-CM | POA: Insufficient documentation

## 2023-06-01 DIAGNOSIS — M722 Plantar fascial fibromatosis: Secondary | ICD-10-CM | POA: Diagnosis not present

## 2023-06-01 DIAGNOSIS — M542 Cervicalgia: Secondary | ICD-10-CM | POA: Insufficient documentation

## 2023-06-01 DIAGNOSIS — Z0130 Encounter for examination of blood pressure without abnormal findings: Secondary | ICD-10-CM

## 2023-06-01 DIAGNOSIS — M25571 Pain in right ankle and joints of right foot: Secondary | ICD-10-CM | POA: Diagnosis not present

## 2023-06-01 NOTE — Progress Notes (Signed)
    [name] presented today for blood pressure check. Patient is prescribed blood pressure medications and I confirmed that patient stated she takes her BP medication at 3PM each day. Blood pressure was taken in the usual and appropriate manner using an automated BP cuff.     Vitals:   06/01/23 1025  BP: 131/73      Results of today's visit will be routed to The Red Team for review and further management.

## 2023-06-01 NOTE — Therapy (Signed)
 [facility] [address] [place], [place], [address] Phone: [phone]   Fax:  [phone]  Patient Details  Name: [name] MRN: [id number] Date of Birth: October 22, 1970 Referring Provider:  Bevelyn Bryant, MD  Encounter Date: 06/01/2023  Patient arrives to PT session stating she is coming for dry needling services to help with her headaches. PT explains [facility] new cash pay model for these services briefly and patient states that she will be unable to afford this at this time and would like to look somewhere else for PT services. When PT discusses alternative treatments that can provide at this location, patient states she has tried other exercises etc and nothing helps other than dry needling. Session was arrive no charge. PT will reach out to the front to close referral with patient aware and informed can always call to get back on schedule within referral window if changes mind.   Coreen Devoid, PT, DPT 06/01/2023, 2:34 PM  [facility] [address] [place], [place], [address] Phone: [phone]   Fax:  [phone]

## 2023-06-15 DIAGNOSIS — M25571 Pain in right ankle and joints of right foot: Secondary | ICD-10-CM | POA: Diagnosis not present

## 2023-06-15 DIAGNOSIS — M25572 Pain in left ankle and joints of left foot: Secondary | ICD-10-CM | POA: Diagnosis not present

## 2023-06-15 DIAGNOSIS — M79671 Pain in right foot: Secondary | ICD-10-CM | POA: Diagnosis not present

## 2023-06-15 DIAGNOSIS — M722 Plantar fascial fibromatosis: Secondary | ICD-10-CM | POA: Diagnosis not present

## 2023-06-21 DIAGNOSIS — M25572 Pain in left ankle and joints of left foot: Secondary | ICD-10-CM | POA: Diagnosis not present

## 2023-06-21 DIAGNOSIS — M79671 Pain in right foot: Secondary | ICD-10-CM | POA: Diagnosis not present

## 2023-06-21 DIAGNOSIS — M722 Plantar fascial fibromatosis: Secondary | ICD-10-CM | POA: Diagnosis not present

## 2023-06-21 DIAGNOSIS — M25571 Pain in right ankle and joints of right foot: Secondary | ICD-10-CM | POA: Diagnosis not present

## 2023-06-22 ENCOUNTER — Other Ambulatory Visit: Payer: Self-pay | Admitting: Student

## 2023-06-22 DIAGNOSIS — Z1231 Encounter for screening mammogram for malignant neoplasm of breast: Secondary | ICD-10-CM

## 2023-06-23 ENCOUNTER — Telehealth: Payer: Self-pay | Admitting: *Deleted

## 2023-06-23 NOTE — Telephone Encounter (Signed)
 Called patient regarding scheduling her mmmogram / needing information about hx.. waiting call back.

## 2023-06-23 NOTE — Telephone Encounter (Signed)
 Spoke with patient regarding scheduling her mammogram/ patient states she will call next week to schedule-office had already called her. Will follow up.

## 2023-06-29 DIAGNOSIS — M79671 Pain in right foot: Secondary | ICD-10-CM | POA: Diagnosis not present

## 2023-06-29 DIAGNOSIS — M25571 Pain in right ankle and joints of right foot: Secondary | ICD-10-CM | POA: Diagnosis not present

## 2023-06-29 DIAGNOSIS — M25572 Pain in left ankle and joints of left foot: Secondary | ICD-10-CM | POA: Diagnosis not present

## 2023-06-29 DIAGNOSIS — M722 Plantar fascial fibromatosis: Secondary | ICD-10-CM | POA: Diagnosis not present

## 2023-07-13 DIAGNOSIS — M79671 Pain in right foot: Secondary | ICD-10-CM | POA: Diagnosis not present

## 2023-07-13 DIAGNOSIS — M25572 Pain in left ankle and joints of left foot: Secondary | ICD-10-CM | POA: Diagnosis not present

## 2023-07-13 DIAGNOSIS — M722 Plantar fascial fibromatosis: Secondary | ICD-10-CM | POA: Diagnosis not present

## 2023-07-13 DIAGNOSIS — M25571 Pain in right ankle and joints of right foot: Secondary | ICD-10-CM | POA: Diagnosis not present

## 2023-07-29 ENCOUNTER — Encounter: Payer: Self-pay | Admitting: Internal Medicine

## 2023-07-31 ENCOUNTER — Other Ambulatory Visit: Payer: Self-pay

## 2023-07-31 ENCOUNTER — Emergency Department (HOSPITAL_BASED_OUTPATIENT_CLINIC_OR_DEPARTMENT_OTHER)
Admission: EM | Admit: 2023-07-31 | Discharge: 2023-07-31 | Disposition: A | Discharge status: Home / Self Care | Attending: Emergency Medicine | Admitting: Emergency Medicine

## 2023-07-31 ENCOUNTER — Encounter (HOSPITAL_BASED_OUTPATIENT_CLINIC_OR_DEPARTMENT_OTHER): Payer: Self-pay

## 2023-07-31 DIAGNOSIS — M79652 Pain in left thigh: Secondary | ICD-10-CM | POA: Insufficient documentation

## 2023-07-31 MED ORDER — LIDOCAINE 5 % EX PTCH: 1.0000 | MEDICATED_PATCH | CUTANEOUS | 0 refills | Status: AC

## 2023-07-31 NOTE — Discharge Instructions (Signed)
 Follow-up with your primary doctor or sports medicine if no improvement by the end the week.  Return for leg swelling, shortness of breath, chest pain, syncope, redness spreading or fevers. Use Tylenol  every 4 hours and ibuprofen  every 6 hours.  For severe pain take both together every 6 hours as needed not on empty stomach.  Use lidocaine  patches as needed.  Work note provided.

## 2023-07-31 NOTE — ED Triage Notes (Signed)
 Left thigh pain onset Tuesday. Denies injury. Reports she has been carrying her grandson around more recently. Took ibuprofen  and tylenol  with some relief. Using lidocaine  patches.

## 2023-07-31 NOTE — ED Provider Notes (Signed)
 [place] EMERGENCY DEPARTMENT AT [facility] Provider Note   CSN: [id number] Arrival date & time: 07/31/23  1325     Patient presents with: Leg Pain   [name] is a 53 y.o. female.   Patient presents with lateral left thigh pain worse with position and movement.  Patient has been caring her grandson more recently.  Patient stands at work.  No direct trauma or falls.  No swelling shortness of breath or chest pain.  No fevers or chills.  No history of joint disease.  No inner thigh pain.  No blood clot history.  The history is provided by the patient.  Leg Pain Associated symptoms: no back pain, no fever and no neck pain        Prior to Admission medications   Medication Sig Start Date End Date Taking? Authorizing Provider  amLODipine  (NORVASC ) 5 MG tablet Take 1 tablet (5 mg total) by mouth daily. 05/19/23 05/18/24 Yes Zheng, Michael, DO  Cholecalciferol  (VITAMIN D -3) 25 MCG (1000 UT) CAPS Take 1,000 Units by mouth daily.   Yes [provider]  ferrous sulfate  325 (65 FE) MG tablet Take 1 tablet (325 mg total) by mouth every other day. 05/26/23  Yes Zheng, Michael, DO  levothyroxine  (SYNTHROID ) 88 MCG tablet Take 1 tablet (88 mcg total) by mouth daily. 05/26/23 05/25/24 Yes Zheng, Michael, DO  lidocaine  (LIDODERM ) 5 % Place 1 patch onto the skin daily. Remove & Discard patch within 12 hours or as directed by MD 07/31/23  Yes Tonia Chew, MD  metoprolol  tartrate (LOPRESSOR ) 25 MG tablet TAKE 1/2 TABLET(12.5 MG) BY MOUTH TWICE DAILY 01/06/23  Yes Croitoru, Mihai, MD    Allergies: Bee venom, Bactrim  [sulfamethoxazole -trimethoprim ], Codeine, and Sulfamethoxazole -trimethoprim     Review of Systems  Constitutional:  Negative for chills and fever.  HENT:  Negative for congestion.   Eyes:  Negative for visual disturbance.  Respiratory:  Negative for shortness of breath.   Cardiovascular:  Negative for chest pain.  Gastrointestinal:  Negative for abdominal pain and  vomiting.  Genitourinary:  Negative for dysuria and flank pain.  Musculoskeletal:  Negative for back pain, neck pain and neck stiffness.  Skin:  Negative for rash.  Neurological:  Negative for light-headedness and headaches.    Updated Vital Signs BP 128/80 (BP Location: Right Arm)   Pulse 88   Temp 98.1 F (36.7 C) (Oral)   Resp 16   Ht 5' 5 (1.651 m)   Wt 113.9 kg   LMP 03/31/2013 (Exact Date)   SpO2 99%   BMI 41.77 kg/m   Physical Exam Vitals and nursing note reviewed.  Constitutional:      General: She is not in acute distress.    Appearance: She is well-developed.  HENT:     Head: Normocephalic and atraumatic.     Mouth/Throat:     Mouth: Mucous membranes are moist.  Eyes:     General:        Right eye: No discharge.        Left eye: No discharge.  Neck:     Trachea: No tracheal deviation.  Cardiovascular:     Rate and Rhythm: Normal rate.  Pulmonary:     Effort: Pulmonary effort is normal.  Abdominal:     General: There is no distension.  Musculoskeletal:        General: Tenderness present. No swelling.     Cervical back: Normal range of motion and neck supple. No rigidity.  Comments: Patient has mild tenderness lateral mid thigh no swelling erythema or induration.  Compartments soft.  No tenderness medial thigh.  No signs of infection.  Skin:    General: Skin is warm.     Capillary Refill: Capillary refill takes less than 2 seconds.     Findings: No rash.  Neurological:     General: No focal deficit present.     Mental Status: She is alert.  Psychiatric:        Mood and Affect: Mood normal.     (all labs ordered are listed, but only abnormal results are displayed) Labs Reviewed - No data to display  EKG: None  Radiology: No results found.   Procedures   Medications Ordered in the ED - No data to display                                  Medical Decision Making Risk Prescription drug management.   Patient presents with isolated  lateral thigh tenderness along IT band area.  Discussed likely musculoskeletal related.  No risk or clinical concern at this time for blood clot or fracture.  Will hold on imaging at this time.  Discussed supportive care and reasons to return.  Patient comfortable with plan.  Work note provided.     Final diagnoses:  Acute pain of left thigh    ED Discharge Orders          Ordered    lidocaine  (LIDODERM ) 5 %  Every 24 hours        07/31/23 1428               Tonia Chew, MD 07/31/23 1430

## 2023-08-04 DIAGNOSIS — M5459 Other low back pain: Secondary | ICD-10-CM | POA: Diagnosis not present

## 2023-08-04 DIAGNOSIS — M79652 Pain in left thigh: Secondary | ICD-10-CM | POA: Diagnosis not present

## 2023-08-10 ENCOUNTER — Telehealth: Payer: Self-pay | Admitting: *Deleted

## 2023-08-10 NOTE — Telephone Encounter (Signed)
 Spoke with patient, she was to call and schedule her mammogram, states she forgot but will call and schedule latter today at work.

## 2023-08-15 DIAGNOSIS — M722 Plantar fascial fibromatosis: Secondary | ICD-10-CM | POA: Diagnosis not present

## 2023-08-15 DIAGNOSIS — M25572 Pain in left ankle and joints of left foot: Secondary | ICD-10-CM | POA: Diagnosis not present

## 2023-08-15 DIAGNOSIS — M25571 Pain in right ankle and joints of right foot: Secondary | ICD-10-CM | POA: Diagnosis not present

## 2023-08-15 DIAGNOSIS — M79671 Pain in right foot: Secondary | ICD-10-CM | POA: Diagnosis not present

## 2023-08-30 DIAGNOSIS — M79671 Pain in right foot: Secondary | ICD-10-CM | POA: Diagnosis not present

## 2023-08-30 DIAGNOSIS — M722 Plantar fascial fibromatosis: Secondary | ICD-10-CM | POA: Diagnosis not present

## 2023-08-30 DIAGNOSIS — M25572 Pain in left ankle and joints of left foot: Secondary | ICD-10-CM | POA: Diagnosis not present

## 2023-08-30 DIAGNOSIS — M25571 Pain in right ankle and joints of right foot: Secondary | ICD-10-CM | POA: Diagnosis not present

## 2023-09-05 DIAGNOSIS — M545 Low back pain, unspecified: Secondary | ICD-10-CM | POA: Diagnosis not present

## 2023-09-07 DIAGNOSIS — M25572 Pain in left ankle and joints of left foot: Secondary | ICD-10-CM | POA: Diagnosis not present

## 2023-09-07 DIAGNOSIS — M25571 Pain in right ankle and joints of right foot: Secondary | ICD-10-CM | POA: Diagnosis not present

## 2023-09-07 DIAGNOSIS — M722 Plantar fascial fibromatosis: Secondary | ICD-10-CM | POA: Diagnosis not present

## 2023-09-07 DIAGNOSIS — M79671 Pain in right foot: Secondary | ICD-10-CM | POA: Diagnosis not present

## 2023-09-13 DIAGNOSIS — M722 Plantar fascial fibromatosis: Secondary | ICD-10-CM | POA: Diagnosis not present

## 2023-09-13 DIAGNOSIS — M79671 Pain in right foot: Secondary | ICD-10-CM | POA: Diagnosis not present

## 2023-09-13 DIAGNOSIS — M25571 Pain in right ankle and joints of right foot: Secondary | ICD-10-CM | POA: Diagnosis not present

## 2023-09-13 DIAGNOSIS — M25572 Pain in left ankle and joints of left foot: Secondary | ICD-10-CM | POA: Diagnosis not present

## 2023-09-15 ENCOUNTER — Telehealth: Payer: Self-pay | Admitting: *Deleted

## 2023-09-15 NOTE — Telephone Encounter (Signed)
 Mammogram scheduled for September 22.025 (made by patient)

## 2023-09-26 DIAGNOSIS — M545 Low back pain, unspecified: Secondary | ICD-10-CM | POA: Diagnosis not present

## 2023-10-05 DIAGNOSIS — M545 Low back pain, unspecified: Secondary | ICD-10-CM | POA: Diagnosis not present

## 2023-10-10 ENCOUNTER — Ambulatory Visit
Admission: RE | Admit: 2023-10-10 | Discharge: 2023-10-10 | Disposition: A | Discharge status: Home / Self Care | Source: Ambulatory Visit | Attending: Internal Medicine | Admitting: Internal Medicine

## 2023-10-10 ENCOUNTER — Ambulatory Visit: Admitting: Student

## 2023-10-10 ENCOUNTER — Encounter: Payer: Self-pay | Admitting: Student

## 2023-10-10 VITALS — BP 134/84 | HR 73 | Temp 98.2°F | Ht 65.0 in | Wt 247.8 lb

## 2023-10-10 DIAGNOSIS — G959 Disease of spinal cord, unspecified: Secondary | ICD-10-CM | POA: Diagnosis not present

## 2023-10-10 DIAGNOSIS — Z79899 Other long term (current) drug therapy: Secondary | ICD-10-CM | POA: Diagnosis not present

## 2023-10-10 DIAGNOSIS — Z79890 Hormone replacement therapy (postmenopausal): Secondary | ICD-10-CM

## 2023-10-10 DIAGNOSIS — D508 Other iron deficiency anemias: Secondary | ICD-10-CM

## 2023-10-10 DIAGNOSIS — Z87891 Personal history of nicotine dependence: Secondary | ICD-10-CM | POA: Diagnosis not present

## 2023-10-10 DIAGNOSIS — Z23 Encounter for immunization: Secondary | ICD-10-CM

## 2023-10-10 DIAGNOSIS — D510 Vitamin B12 deficiency anemia due to intrinsic factor deficiency: Secondary | ICD-10-CM

## 2023-10-10 DIAGNOSIS — I10 Essential (primary) hypertension: Secondary | ICD-10-CM

## 2023-10-10 DIAGNOSIS — E063 Autoimmune thyroiditis: Secondary | ICD-10-CM

## 2023-10-10 DIAGNOSIS — Z0000 Encounter for general adult medical examination without abnormal findings: Secondary | ICD-10-CM

## 2023-10-10 DIAGNOSIS — E559 Vitamin D deficiency, unspecified: Secondary | ICD-10-CM

## 2023-10-10 DIAGNOSIS — Z981 Arthrodesis status: Secondary | ICD-10-CM

## 2023-10-10 DIAGNOSIS — Z1231 Encounter for screening mammogram for malignant neoplasm of breast: Secondary | ICD-10-CM | POA: Diagnosis not present

## 2023-10-10 DIAGNOSIS — Z8249 Family history of ischemic heart disease and other diseases of the circulatory system: Secondary | ICD-10-CM | POA: Diagnosis not present

## 2023-10-10 MED ORDER — FERROUS SULFATE 325 (65 FE) MG PO TABS: 325.0000 mg | ORAL_TABLET | ORAL | 3 refills | Status: AC

## 2023-10-10 MED ORDER — PREGABALIN 50 MG PO CAPS: 50.0000 mg | ORAL_CAPSULE | Freq: Three times a day (TID) | ORAL | 2 refills | Status: AC

## 2023-10-10 NOTE — Assessment & Plan Note (Addendum)
 Last visit in May, TSH was elevated and levothyroxine  increased to 88 mcg in AM. Reports adherence and properly taking it.   Plan - Continue levothyroxine  88 mcg in AM, will adjust accordingly  - Repeat TSH today

## 2023-10-10 NOTE — Patient Instructions (Addendum)
 Thank you, Ms.Jaedan R Demirjian for allowing us  to provide your care today. Today we discussed:  -Blood pressure improved on recheck. Continue taking metoprolol  and amlodipine .  -Will do blood work today to check thyroid , iron , vitamin D  and B12 -Start Lyrica  50 mg (take at night then up to 3 times a day) for nerve pain -Referral back to One[facility] LITTIE Priestly, MD sent  -Schedule follow up with GI doctor [facility] [address] [place] [address] Main: [phone]  I have ordered the following medication/changed the following medications:  Start the following medications: Meds ordered this encounter  Medications   pregabalin  (LYRICA ) 50 MG capsule    Sig: Take 1 capsule (50 mg total) by mouth 3 (three) times daily.    Dispense:  90 capsule    Refill:  2   ferrous sulfate  325 (65 FE) MG tablet    Sig: Take 1 tablet (325 mg total) by mouth every other day.    Dispense:  45 tablet    Refill:  3     Follow up: 3-4 months   Should you have any questions or concerns please call the internal medicine clinic at [phone].    Ivyonna Hoelzel, D.O. [facility]

## 2023-10-10 NOTE — Assessment & Plan Note (Addendum)
 Status: improving. BP today 134/84 on recheck. Reports taking metoprolol  12.5 mg twice daily and amlodipine  5 mg. She has NOT taken her medications this morning. Last BMP in 02/2023 was normal. No acute concerns per patient.   Plan -Continue: metoprolol  and amlodipine 

## 2023-10-10 NOTE — Assessment & Plan Note (Signed)
 Taking iron  supplementation. Will repeat iron  studies today. Provided contact info for GI to further follow up. Hx of colonoscopy (2024) with 2 tubular adenoma. Hx of hysterectomy.

## 2023-10-10 NOTE — Assessment & Plan Note (Signed)
 Received flu vaccine today

## 2023-10-10 NOTE — Assessment & Plan Note (Signed)
 Last visit in May, vitamin D  level was low and was started on daily supplementation. Reports adherence. Will recheck today.

## 2023-10-10 NOTE — Assessment & Plan Note (Signed)
Recheck B12 level today

## 2023-10-10 NOTE — Progress Notes (Signed)
 CC: routine visit  HPI: [name] is a 53 y.o. female living with a history stated below and presents today for routine visit. Please see problem based assessment and plan for additional details.  Past Medical History:  Diagnosis Date   Anxiety    Arthritis    B12 deficiency    Back pain    Elevated blood pressure reading    Fibroids, submucosal    History of blood transfusion 02/19/2011   [facility] - ? 2-3 units transfused   Hypothyroidism    Iron  deficiency anemia    Vitamin D  deficiency     Current Outpatient Medications on File Prior to Visit  Medication Sig Dispense Refill   amLODipine  (NORVASC ) 5 MG tablet Take 1 tablet (5 mg total) by mouth daily. 30 tablet 11   Cholecalciferol  (VITAMIN D -3) 25 MCG (1000 UT) CAPS Take 1,000 Units by mouth daily.     levothyroxine  (SYNTHROID ) 88 MCG tablet Take 1 tablet (88 mcg total) by mouth daily. 30 tablet 11   lidocaine  (LIDODERM ) 5 % Place 1 patch onto the skin daily. Remove & Discard patch within 12 hours or as directed by MD 10 patch 0   metoprolol  tartrate (LOPRESSOR ) 25 MG tablet TAKE 1/2 TABLET(12.5 MG) BY MOUTH TWICE DAILY 90 tablet 3   No current facility-administered medications on file prior to visit.    Family History  Problem Relation Age of Onset   Hypertension Mother    Seizures Father    Sudden death Father    Fibroids Sister    Breast cancer Neg Hx    Colon cancer Neg Hx    Colon polyps Neg Hx    Esophageal cancer Neg Hx    Rectal cancer Neg Hx    Stomach cancer Neg Hx     Social History   Socioeconomic History   Marital status: Single    Spouse name: Not on file   Number of children: Not on file   Years of education: Not on file   Highest education level: Not on file  Occupational History   Not on file  Tobacco Use   Smoking status: Former    Current packs/day: 0.00    Types: Cigarettes    Quit date: 01/21/2011    Years since quitting: 12.7   Smokeless tobacco: Never  Vaping Use   Vaping  status: Never Used  Substance and Sexual Activity   Alcohol use: Yes    Comment: social occasions   Drug use: No   Sexual activity: Not Currently    Birth control/protection: Condom, Surgical  Other Topics Concern   Not on file  Social History Narrative   Not on file   Social Drivers of Health   Financial Resource Strain: Low Risk  (10/10/2023)   Overall Financial Resource Strain (CARDIA)    Difficulty of Paying Living Expenses: Not very hard  Food Insecurity: No Food Insecurity (10/10/2023)   Hunger Vital Sign    Worried About Running Out of Food in the Last Year: Never true    Ran Out of Food in the Last Year: Never true  Transportation Needs: No Transportation Needs (10/10/2023)   PRAPARE - Administrator, Civil Service (Medical): No    Lack of Transportation (Non-Medical): No  Physical Activity: Insufficiently Active (10/10/2023)   Exercise Vital Sign    Days of Exercise per Week: 4 days    Minutes of Exercise per Session: 30 min  Stress: No Stress Concern Present (10/10/2023)  [employer] of Occupational Health - Occupational Stress Questionnaire    Feeling of Stress: Only a little  Social Connections: Moderately Isolated (10/10/2023)   Social Connection and Isolation Panel    Frequency of Communication with Friends and Family: More than three times a week    Frequency of Social Gatherings with Friends and Family: Not on file    Attends Religious Services: More than 4 times per year    Active Member of [employer] or Organizations: No    Attends Banker Meetings: Never    Marital Status: Never married  Intimate Partner Violence: Not At Risk (10/10/2023)   Humiliation, Afraid, Rape, and Kick questionnaire    Fear of Current or Ex-Partner: No    Emotionally Abused: No    Physically Abused: No    Sexually Abused: No    Review of Systems: ROS negative except for what is noted on the assessment and plan.  Vitals:   10/10/23 0833 10/10/23 0909   BP: (!) 163/93 134/84  Pulse: 74 73  Temp: 98.2 F (36.8 C)   TempSrc: Oral   SpO2: 98%   Weight: 247 lb 12.8 oz (112.4 kg)   Height: 5' 5 (1.651 m)    Physical Exam: Constitutional: well-appearing female sitting in chair, in no acute distress Cardiovascular: regular rate and rhythm Pulmonary/Chest: normal work of breathing on room air, lungs clear to auscultation bilaterally MSK: tight musculature of shoulders/neck, b/l UE strength intact  Neurological: alert & oriented x 3 Skin: warm and dry  Assessment & Plan:   Primary hypertension Status: improving. BP today 134/84 on recheck. Reports taking metoprolol  12.5 mg twice daily and amlodipine  5 mg. She has NOT taken her medications this morning. Last BMP in 02/2023 was normal. No acute concerns per patient.   Plan -Continue: metoprolol  and amlodipine    Hashimoto's thyroiditis Last visit in May, TSH was elevated and levothyroxine  increased to 88 mcg in AM. Reports adherence and properly taking it.   Plan - Continue levothyroxine  88 mcg in AM, will adjust accordingly  - Repeat TSH today   Vitamin D  deficiency Last visit in May, vitamin D  level was low and was started on daily supplementation. Reports adherence. Will recheck today.   Pernicious anemia Recheck B12 level today.   Iron  deficiency anemia Taking iron  supplementation. Will repeat iron  studies today. Provided contact info for GI to further follow up. Hx of colonoscopy (2024) with 2 tubular adenoma. Hx of hysterectomy.   Healthcare maintenance Received flu vaccine today.   Cervical myelopathy s/p cervical decompression/discectomy fusion (HCC) Hx of ACDF of C4-7 in 04/2019. Endorses tension and tenderness of neck and shoulders but also pain extending to elbows. No LOC, imbalance or numbness of distal UE. Unable to go to PT due to cost. Patient requests referral back to [facility] to further discuss with them. Discussed muscle relaxer such as Robaxin  but  patient declined at this time, opted for Lyrica . Had taken gabapentin  in past with mixed response.   Plan -Start Lyrica  50 mg (can titrate up to TID) -Referral back to [facility] per patient's request   Patient discussed with Dr. Machen  Joden Bonsall, D.O. [facility] Internal [facility], PGY-3 Phone: [phone] Date 10/10/2023 Time 2:39 PM

## 2023-10-10 NOTE — Assessment & Plan Note (Addendum)
 Hx of ACDF of C4-7 in 04/2019. Endorses tension and tenderness of neck and shoulders but also pain extending to elbows. No LOC, imbalance or numbness of distal UE. Unable to go to PT due to cost. Patient requests referral back to [facility] to further discuss with them. Discussed muscle relaxer such as Robaxin  but patient declined at this time, opted for Lyrica . Had taken gabapentin  in past with mixed response.   Plan -Start Lyrica  50 mg (can titrate up to TID) -Referral back to [facility] per patient's request

## 2023-10-11 ENCOUNTER — Ambulatory Visit: Payer: Self-pay | Admitting: Student

## 2023-10-11 LAB — IRON AND TIBC
Iron Saturation: 16 % (ref 15–55)
Iron: 60 ug/dL (ref 27–159)
Total Iron Binding Capacity: 384 ug/dL (ref 250–450)
UIBC: 324 ug/dL (ref 131–425)

## 2023-10-11 LAB — VITAMIN B12: Vitamin B-12: 441 pg/mL (ref 232–1245)

## 2023-10-11 LAB — TSH: TSH: 2.48 u[IU]/mL (ref 0.450–4.500)

## 2023-10-11 LAB — FERRITIN: Ferritin: 20 ng/mL (ref 15–150)

## 2023-10-11 LAB — VITAMIN D 25 HYDROXY (VIT D DEFICIENCY, FRACTURES): Vit D, 25-Hydroxy: 13.7 ng/mL — ABNORMAL LOW (ref 30.0–100.0)

## 2023-10-11 MED ORDER — VITAMIN D (ERGOCALCIFEROL) 1.25 MG (50000 UNIT) PO CAPS: 50000.0000 [IU] | ORAL_CAPSULE | ORAL | 0 refills | Status: AC

## 2023-10-11 NOTE — Addendum Note (Signed)
 Addended by: ELICIA SHARPER on: 10/11/2023 05:51 PM   Modules accepted: Orders

## 2023-10-12 ENCOUNTER — Ambulatory Visit: Payer: Self-pay | Admitting: Student

## 2023-10-12 DIAGNOSIS — M545 Low back pain, unspecified: Secondary | ICD-10-CM | POA: Diagnosis not present

## 2023-10-12 NOTE — Progress Notes (Signed)
 Internal Medicine Clinic Attending  Case discussed with the resident at the time of the visit.  We reviewed the resident's history and exam and pertinent patient test results.  I agree with the assessment, diagnosis, and plan of care documented in the resident's note.    Patient will need lifelong B12 supplementation for pernicious anemia.

## 2023-10-19 DIAGNOSIS — M545 Low back pain, unspecified: Secondary | ICD-10-CM | POA: Diagnosis not present

## 2023-10-24 DIAGNOSIS — G959 Disease of spinal cord, unspecified: Secondary | ICD-10-CM | POA: Diagnosis not present

## 2023-10-24 DIAGNOSIS — M542 Cervicalgia: Secondary | ICD-10-CM | POA: Diagnosis not present

## 2023-11-07 DIAGNOSIS — M545 Low back pain, unspecified: Secondary | ICD-10-CM | POA: Diagnosis not present

## 2023-11-08 DIAGNOSIS — M542 Cervicalgia: Secondary | ICD-10-CM | POA: Diagnosis not present

## 2023-11-24 ENCOUNTER — Other Ambulatory Visit: Payer: Self-pay

## 2023-11-24 ENCOUNTER — Encounter (HOSPITAL_BASED_OUTPATIENT_CLINIC_OR_DEPARTMENT_OTHER): Payer: Self-pay | Admitting: Emergency Medicine

## 2023-11-24 ENCOUNTER — Emergency Department (HOSPITAL_BASED_OUTPATIENT_CLINIC_OR_DEPARTMENT_OTHER): Admission: EM | Admit: 2023-11-24 | Discharge: 2023-11-24 | Disposition: A | Discharge status: Home / Self Care

## 2023-11-24 DIAGNOSIS — R519 Headache, unspecified: Secondary | ICD-10-CM | POA: Diagnosis present

## 2023-11-24 DIAGNOSIS — G43809 Other migraine, not intractable, without status migrainosus: Secondary | ICD-10-CM | POA: Insufficient documentation

## 2023-11-24 LAB — CBC WITH DIFFERENTIAL/PLATELET
Abs Immature Granulocytes: 0.01 K/uL (ref 0.00–0.07)
Basophils Absolute: 0.1 K/uL (ref 0.0–0.1)
Basophils Relative: 1 %
Eosinophils Absolute: 0.1 K/uL (ref 0.0–0.5)
Eosinophils Relative: 2 %
HCT: 38.2 % (ref 36.0–46.0)
Hemoglobin: 12.9 g/dL (ref 12.0–15.0)
Immature Granulocytes: 0 %
Lymphocytes Relative: 34 %
Lymphs Abs: 1.7 K/uL (ref 0.7–4.0)
MCH: 26.8 pg (ref 26.0–34.0)
MCHC: 33.8 g/dL (ref 30.0–36.0)
MCV: 79.3 fL — ABNORMAL LOW (ref 80.0–100.0)
Monocytes Absolute: 0.6 K/uL (ref 0.1–1.0)
Monocytes Relative: 11 %
Neutro Abs: 2.7 K/uL (ref 1.7–7.7)
Neutrophils Relative %: 52 %
Platelets: 342 K/uL (ref 150–400)
RBC: 4.82 MIL/uL (ref 3.87–5.11)
RDW: 13.1 % (ref 11.5–15.5)
WBC: 5.1 K/uL (ref 4.0–10.5)
nRBC: 0 % (ref 0.0–0.2)

## 2023-11-24 LAB — BASIC METABOLIC PANEL WITH GFR
Anion gap: 10 (ref 5–15)
BUN: 12 mg/dL (ref 6–20)
CO2: 24 mmol/L (ref 22–32)
Calcium: 9.1 mg/dL (ref 8.9–10.3)
Chloride: 103 mmol/L (ref 98–111)
Creatinine, Ser: 0.62 mg/dL (ref 0.44–1.00)
GFR, Estimated: >60 mL/min (ref >60)
Glucose, Bld: 83 mg/dL (ref 70–99)
Potassium: 3.9 mmol/L (ref 3.5–5.1)
Sodium: 136 mmol/L (ref 135–145)

## 2023-11-24 MED ORDER — KETOROLAC TROMETHAMINE 15 MG/ML IJ SOLN
15.0000 mg | Freq: Once | INTRAMUSCULAR | Status: AC
  Administered 2023-11-24: 15 mg via INTRAVENOUS
  Filled 2023-11-24: qty 1

## 2023-11-24 MED ORDER — LACTATED RINGERS IV BOLUS
500.0000 mL | Freq: Once | INTRAVENOUS | Status: AC
  Administered 2023-11-24: 500 mL via INTRAVENOUS

## 2023-11-24 MED ORDER — METOCLOPRAMIDE HCL 5 MG/ML IJ SOLN
10.0000 mg | Freq: Once | INTRAMUSCULAR | Status: AC
  Administered 2023-11-24: 10 mg via INTRAVENOUS
  Filled 2023-11-24: qty 2

## 2023-11-24 MED ORDER — DIPHENHYDRAMINE HCL 50 MG/ML IJ SOLN
25.0000 mg | Freq: Once | INTRAMUSCULAR | Status: AC
  Administered 2023-11-24: 25 mg via INTRAVENOUS
  Filled 2023-11-24: qty 1

## 2023-11-24 NOTE — Discharge Instructions (Signed)
 You can continue your Excedrin Migraine and ibuprofen  as needed for your headaches.

## 2023-11-24 NOTE — ED Triage Notes (Signed)
 Reports migraine like headache x 2 weeks. Hx of migraines.

## 2023-11-24 NOTE — ED Provider Notes (Signed)
 [place] EMERGENCY DEPARTMENT AT [facility] Provider Note   CSN: [id number] Arrival date & time: 11/24/23  1215     Patient presents with: Headache   [name] is a 53 y.o. female.   53 year old female presents for evaluation of migraine headache.  States has been going off and on for 2 weeks and occasionally Excedrin Migraine helps.  She states she is just not feeling like herself.  Denies any nausea or vomiting or any other symptoms or concerns.  States she usually comes in the ER for her headache and they gave her something through the IV that helps.   Headache Associated symptoms: no abdominal pain, no back pain, no cough, no ear pain, no eye pain, no fever, no seizures, no sore throat and no vomiting        Prior to Admission medications   Medication Sig Start Date End Date Taking? Authorizing Provider  amLODipine  (NORVASC ) 5 MG tablet Take 1 tablet (5 mg total) by mouth daily. 05/19/23 05/18/24  Zheng, Michael, DO  cyanocobalamin  1000 MCG tablet Take 1,000 mcg by mouth daily. 10/12/23   Zheng, Michael, DO  ferrous sulfate  325 (65 FE) MG tablet Take 1 tablet (325 mg total) by mouth every other day. 10/10/23   Zheng, Michael, DO  levothyroxine  (SYNTHROID ) 88 MCG tablet Take 1 tablet (88 mcg total) by mouth daily. 05/26/23 05/25/24  Zheng, Michael, DO  lidocaine  (LIDODERM ) 5 % Place 1 patch onto the skin daily. Remove & Discard patch within 12 hours or as directed by MD 07/31/23   Tonia Chew, MD  metoprolol  tartrate (LOPRESSOR ) 25 MG tablet TAKE 1/2 TABLET(12.5 MG) BY MOUTH TWICE DAILY 01/06/23   Croitoru, Mihai, MD  pregabalin  (LYRICA ) 50 MG capsule Take 1 capsule (50 mg total) by mouth 3 (three) times daily. 10/10/23 10/09/24  Zheng, Michael, DO  Vitamin D , Ergocalciferol , (DRISDOL ) 1.25 MG (50000 UNIT) CAPS capsule Take 1 capsule (50,000 Units total) by mouth every 7 (seven) days. 10/11/23   Zheng, Michael, DO    Allergies: Bee venom, Bactrim   [sulfamethoxazole -trimethoprim ], Codeine, and Sulfamethoxazole -trimethoprim     Review of Systems  Constitutional:  Negative for chills and fever.  HENT:  Negative for ear pain and sore throat.   Eyes:  Negative for pain and visual disturbance.  Respiratory:  Negative for cough and shortness of breath.   Cardiovascular:  Negative for chest pain and palpitations.  Gastrointestinal:  Negative for abdominal pain and vomiting.  Genitourinary:  Negative for dysuria and hematuria.  Musculoskeletal:  Negative for arthralgias and back pain.  Skin:  Negative for color change and rash.  Neurological:  Positive for headaches. Negative for seizures and syncope.  All other systems reviewed and are negative.   Updated Vital Signs BP (!) 148/89 (BP Location: Right Arm)   Pulse 61   Temp 98.3 F (36.8 C) (Oral)   Resp 18   LMP 03/31/2013 (Exact Date)   SpO2 99%   Physical Exam Vitals and nursing note reviewed.  Constitutional:      General: She is not in acute distress.    Appearance: She is well-developed. She is not ill-appearing.  HENT:     Head: Normocephalic and atraumatic.  Eyes:     Conjunctiva/sclera: Conjunctivae normal.  Cardiovascular:     Rate and Rhythm: Normal rate and regular rhythm.     Heart sounds: No murmur heard. Pulmonary:     Effort: Pulmonary effort is normal. No respiratory distress.     Breath sounds: Normal  breath sounds.  Abdominal:     Palpations: Abdomen is soft.     Tenderness: There is no abdominal tenderness.  Musculoskeletal:        General: No swelling.     Cervical back: Neck supple.  Skin:    General: Skin is warm and dry.     Capillary Refill: Capillary refill takes less than 2 seconds.  Neurological:     Mental Status: She is alert.     Cranial Nerves: No cranial nerve deficit.     Sensory: No sensory deficit.  Psychiatric:        Mood and Affect: Mood normal.     (all labs ordered are listed, but only abnormal results are  displayed) Labs Reviewed  CBC WITH DIFFERENTIAL/PLATELET - Abnormal; Notable for the following components:      Result Value   MCV 79.3 (*)    All other components within normal limits  BASIC METABOLIC PANEL WITH GFR    EKG: None  Radiology: No results found.   Procedures   Medications Ordered in the ED  lactated ringers  bolus 500 mL (0 mLs Intravenous Stopped 11/24/23 1334)  ketorolac  (TORADOL ) 15 MG/ML injection 15 mg (15 mg Intravenous Given 11/24/23 1246)  metoCLOPramide  (REGLAN ) injection 10 mg (10 mg Intravenous Given 11/24/23 1246)  diphenhydrAMINE  (BENADRYL ) injection 25 mg (25 mg Intravenous Given 11/24/23 1246)                                    Medical Decision Making Patient here for migraine headache.  Feeling better after fluids and a migraine cocktail.  Labs fairly unremarkable.  Advised close follow-up with primary care to continue her Excedrin Migraine as needed and otherwise return to the ER for new or worsening symptoms.  She feels comfortable to plan to be discharged home.  Problems Addressed: Other migraine without status migrainosus, not intractable: acute illness or injury  Amount and/or Complexity of Data Reviewed External Data Reviewed: notes.    Details: Prior ED records reviewed and patient been seen here in the past for migraine and responded well to migraine cocktail Labs: ordered. Decision-making details documented in ED Course.    Details: Ordered and reviewed by me and unremarkable  Risk OTC drugs. Prescription drug management. Drug therapy requiring intensive monitoring for toxicity.     Final diagnoses:  Other migraine without status migrainosus, not intractable    ED Discharge Orders     None          Gennaro Duwaine CROME, DO 11/24/23 1429

## 2023-11-28 DIAGNOSIS — M5412 Radiculopathy, cervical region: Secondary | ICD-10-CM | POA: Diagnosis not present

## 2023-12-05 DIAGNOSIS — M542 Cervicalgia: Secondary | ICD-10-CM | POA: Diagnosis not present

## 2023-12-05 DIAGNOSIS — Z981 Arthrodesis status: Secondary | ICD-10-CM | POA: Diagnosis not present

## 2023-12-07 ENCOUNTER — Other Ambulatory Visit: Payer: Self-pay | Admitting: Student

## 2023-12-12 ENCOUNTER — Encounter (HOSPITAL_BASED_OUTPATIENT_CLINIC_OR_DEPARTMENT_OTHER): Payer: Self-pay

## 2023-12-12 ENCOUNTER — Other Ambulatory Visit: Payer: Self-pay

## 2023-12-12 ENCOUNTER — Emergency Department (HOSPITAL_BASED_OUTPATIENT_CLINIC_OR_DEPARTMENT_OTHER)
Admission: EM | Admit: 2023-12-12 | Discharge: 2023-12-12 | Disposition: A | Discharge status: Home / Self Care | Attending: Emergency Medicine | Admitting: Emergency Medicine

## 2023-12-12 ENCOUNTER — Emergency Department (HOSPITAL_BASED_OUTPATIENT_CLINIC_OR_DEPARTMENT_OTHER): Admitting: Radiology

## 2023-12-12 DIAGNOSIS — R079 Chest pain, unspecified: Secondary | ICD-10-CM | POA: Diagnosis not present

## 2023-12-12 DIAGNOSIS — R5383 Other fatigue: Secondary | ICD-10-CM | POA: Diagnosis not present

## 2023-12-12 DIAGNOSIS — Z79899 Other long term (current) drug therapy: Secondary | ICD-10-CM | POA: Insufficient documentation

## 2023-12-12 DIAGNOSIS — R0981 Nasal congestion: Secondary | ICD-10-CM | POA: Diagnosis not present

## 2023-12-12 DIAGNOSIS — R0789 Other chest pain: Secondary | ICD-10-CM | POA: Diagnosis not present

## 2023-12-12 DIAGNOSIS — Z981 Arthrodesis status: Secondary | ICD-10-CM | POA: Diagnosis not present

## 2023-12-12 LAB — COMPREHENSIVE METABOLIC PANEL WITH GFR
ALT: 11 U/L (ref 0–44)
AST: 15 U/L (ref 15–41)
Albumin: 4 g/dL (ref 3.5–5.0)
Alkaline Phosphatase: 106 U/L (ref 38–126)
Anion gap: 11 (ref 5–15)
BUN: 15 mg/dL (ref 6–20)
CO2: 23 mmol/L (ref 22–32)
Calcium: 9 mg/dL (ref 8.9–10.3)
Chloride: 102 mmol/L (ref 98–111)
Creatinine, Ser: 0.57 mg/dL (ref 0.44–1.00)
GFR, Estimated: >60 mL/min (ref >60)
Glucose, Bld: 102 mg/dL — ABNORMAL HIGH (ref 70–99)
Potassium: 4 mmol/L (ref 3.5–5.1)
Sodium: 136 mmol/L (ref 135–145)
Total Bilirubin: 0.6 mg/dL (ref 0.0–1.2)
Total Protein: 7.4 g/dL (ref 6.5–8.1)

## 2023-12-12 LAB — URINALYSIS, ROUTINE W REFLEX MICROSCOPIC
Bilirubin Urine: NEGATIVE
Glucose, UA: NEGATIVE mg/dL
Hgb urine dipstick: NEGATIVE
Ketones, ur: NEGATIVE mg/dL
Leukocytes,Ua: NEGATIVE
Nitrite: NEGATIVE
Protein, ur: NEGATIVE mg/dL
Specific Gravity, Urine: 1.013 (ref 1.005–1.030)
pH: 5.5 (ref 5.0–8.0)

## 2023-12-12 LAB — CBC
HCT: 38.8 % (ref 36.0–46.0)
Hemoglobin: 13.2 g/dL (ref 12.0–15.0)
MCH: 27.2 pg (ref 26.0–34.0)
MCHC: 34 g/dL (ref 30.0–36.0)
MCV: 80 fL (ref 80.0–100.0)
Platelets: 345 K/uL (ref 150–400)
RBC: 4.85 MIL/uL (ref 3.87–5.11)
RDW: 13.2 % (ref 11.5–15.5)
WBC: 8.8 K/uL (ref 4.0–10.5)
nRBC: 0 % (ref 0.0–0.2)

## 2023-12-12 LAB — TROPONIN T, HIGH SENSITIVITY: Troponin T High Sensitivity: <15 ng/L (ref 0–19)

## 2023-12-12 MED ORDER — KETOROLAC TROMETHAMINE 15 MG/ML IJ SOLN
15.0000 mg | Freq: Once | INTRAMUSCULAR | Status: AC
  Administered 2023-12-12: 15 mg via INTRAMUSCULAR
  Filled 2023-12-12: qty 1

## 2023-12-12 NOTE — ED Notes (Signed)
 Pt d/c instructions, medications, and follow-up care reviewed with pt. Pt verbalized understanding and had no further questions at time of d/c. Pt CA&Ox4, ambulatory, and in NAD at time of d/c

## 2023-12-12 NOTE — Discharge Instructions (Addendum)
 Monitor your symptoms.  Return to the ER for any new or concerning symptoms as described.

## 2023-12-12 NOTE — ED Provider Notes (Signed)
 [place] EMERGENCY DEPARTMENT AT [place] Provider Note   CSN: [id number] Arrival date & time: 12/12/23  1453     Patient presents with: No chief complaint on file.   [name] is a 53 y.o. female.   HPI  The patient is a 53 year old female with a past medical history significant for anxiety, B12 deficiency, back pain, arthritis, iron  deficiency, vitamin D  deficiency  She presents emergency room today with complaints of feeling low energy since yesterday she states that she took at one of her iron  supplements and felt little better moments later but then the next day felt fatigued again.  She denies any lightheadedness or near syncope.  She states that she had a moment of chest pressure yesterday that resolved quickly and has not returned.  She denies any dyspnea cough or fever.  She has some sinus congestion and feels like her muscles are somewhat achy.      Prior to Admission medications   Medication Sig Start Date End Date Taking? Authorizing Provider  amLODipine  (NORVASC ) 5 MG tablet Take 1 tablet (5 mg total) by mouth daily. 05/19/23 05/18/24  Zheng, Michael, DO  cyanocobalamin  1000 MCG tablet Take 1,000 mcg by mouth daily. 10/12/23   Zheng, Michael, DO  ferrous sulfate  325 (65 FE) MG tablet Take 1 tablet (325 mg total) by mouth every other day. 10/10/23   Zheng, Michael, DO  levothyroxine  (SYNTHROID ) 88 MCG tablet Take 1 tablet (88 mcg total) by mouth daily. 05/26/23 05/25/24  Zheng, Michael, DO  lidocaine  (LIDODERM ) 5 % Place 1 patch onto the skin daily. Remove & Discard patch within 12 hours or as directed by MD 07/31/23   Tonia Chew, MD  metoprolol  tartrate (LOPRESSOR ) 25 MG tablet TAKE 1/2 TABLET(12.5 MG) BY MOUTH TWICE DAILY 01/06/23   Croitoru, Jerel, MD  pregabalin  (LYRICA ) 50 MG capsule Take 1 capsule (50 mg total) by mouth 3 (three) times daily. 10/10/23 10/09/24  Zheng, Michael, DO  Vitamin D , Ergocalciferol , (DRISDOL ) 1.25 MG (50000 UNIT) CAPS capsule Take  1 capsule (50,000 Units total) by mouth every 7 (seven) days. 10/11/23   Zheng, Michael, DO    Allergies: Bee venom, Bactrim  [sulfamethoxazole -trimethoprim ], Codeine, and Sulfamethoxazole -trimethoprim     Review of Systems  Updated Vital Signs BP 122/76   Pulse 65   Temp 98.6 F (37 C) (Oral)   Resp 18   LMP 03/31/2013 (Exact Date)   SpO2 98%   Physical Exam Vitals and nursing note reviewed.  Constitutional:      General: She is not in acute distress.    Comments: Pleasant well-appearing 53 year old.  In no acute distress.  Sitting comfortably in bed.  Able answer questions appropriately follow commands. No increased work of breathing. Speaking in full sentences.   HENT:     Head: Normocephalic and atraumatic.     Nose: Nose normal.  Eyes:     General: No scleral icterus. Cardiovascular:     Rate and Rhythm: Normal rate and regular rhythm.     Pulses: Normal pulses.     Heart sounds: Normal heart sounds.  Pulmonary:     Effort: Pulmonary effort is normal. No respiratory distress.     Breath sounds: No wheezing.  Abdominal:     Palpations: Abdomen is soft.     Tenderness: There is no abdominal tenderness.  Musculoskeletal:     Cervical back: Normal range of motion.     Right lower leg: No edema.     Left lower leg: No  edema.  Skin:    General: Skin is warm and dry.     Capillary Refill: Capillary refill takes less than 2 seconds.  Neurological:     Mental Status: She is alert. Mental status is at baseline.  Psychiatric:        Mood and Affect: Mood normal.        Behavior: Behavior normal.     (all labs ordered are listed, but only abnormal results are displayed) Labs Reviewed  COMPREHENSIVE METABOLIC PANEL WITH GFR - Abnormal; Notable for the following components:      Result Value   Glucose, Bld 102 (*)    All other components within normal limits  CBC  URINALYSIS, ROUTINE W REFLEX MICROSCOPIC  CBG MONITORING, ED  TROPONIN T, HIGH SENSITIVITY     EKG: None  Radiology: DG Chest 2 View Result Date: 12/12/2023 CLINICAL DATA:  cp EXAM: DG CHEST 2V COMPARISON:  12/03/2022 FINDINGS: No focal airspace consolidation, pleural effusion, or pneumothorax. No cardiomegaly.No acute fracture or destructive lesion. Cervical fusion hardware again noted. Multilevel thoracic osteophytosis. IMPRESSION: No acute cardiopulmonary abnormality. Electronically Signed   By: Rogelia Myers M.D.   On: 12/12/2023 19:54     Procedures   Medications Ordered in the ED  ketorolac  (TORADOL ) 15 MG/ML injection 15 mg (15 mg Intramuscular Given 12/12/23 2041)    Clinical Course as of 12/12/23 2058  Mon Dec 12, 2023  2023 Sunday began feeling weak, fatigued.  CP lasts for moments.   [WF]    Clinical Course User Index [WF] Neldon Hamp RAMAN, PA                                 Medical Decision Making Amount and/or Complexity of Data Reviewed Labs: ordered. Radiology: ordered.  Risk Prescription drug management.   This patient presents to the ED for concern of fatigue, this involves a number of treatment options, and is a complaint that carries with it a moderate risk of complications and morbidity. A differential diagnosis was considered for the patient's symptoms which is discussed below:   The differential diagnosis of weakness includes but is not limited to neurologic causes (GBS, myasthenia gravis, CVA, MS, ALS, transverse myelitis, spinal cord injury, CVA, botulism, ) and other causes: ACS, Arrhythmia, syncope, orthostatic hypotension, sepsis, hypoglycemia, electrolyte disturbance, hypothyroidism, respiratory failure, symptomatic anemia, dehydration, heat injury, polypharmacy, malignancy.    Co morbidities: Discussed in HPI   Brief History:  The patient is a 53 year old female with a past medical history significant for anxiety, B12 deficiency, back pain, arthritis, iron  deficiency, vitamin D  deficiency  She presents emergency room today  with complaints of feeling low energy since yesterday she states that she took at one of her iron  supplements and felt little better moments later but then the next day felt fatigued again.  She denies any lightheadedness or near syncope.  She states that she had a moment of chest pressure yesterday that resolved quickly and has not returned.  She denies any dyspnea cough or fever.  She has some sinus congestion and feels like her muscles are somewhat achy.    EMR reviewed including pt PMHx, past surgical history and past visits to ER.   See HPI for more details   Lab Tests:   I personally reviewed all laboratory work and imaging. Metabolic panel without any acute abnormality specifically kidney function within normal limits and no significant electrolyte abnormalities. CBC without  leukocytosis or significant anemia. Troponin undetectable, urinalysis without evidence of infection  Imaging Studies:  Chest x-ray unremarkable    Cardiac Monitoring:  The patient was maintained on a cardiac monitor.  I personally viewed and interpreted the cardiac monitored which showed an underlying rhythm of: NSR EKG non-ischemic   Medicines ordered:  I ordered medication including Toradol  for myalgias Reevaluation of the patient after these medicines showed that the patient resolved I have reviewed the patients home medicines and have made adjustments as needed   Critical Interventions:     Consults/Attending Physician      Reevaluation:  After the interventions noted above I re-evaluated patient and found that they have :resolved   Social Determinants of Health:      Problem List / ED Course:  Patient with somewhat vague fatigue and some myalgias well-appearing normal labs speaking full sentences normal vital signs reassuring physical exam overall I suspect viral illness low suspicion for pneumonia or other acute emergent disease myocarditis unlikely given normal troponin and  no ongoing chest pain.  Given return precautions emergency room otherwise follow-up with primary care and return precautions discussed.   Dispostion:  After consideration of the diagnostic results and the patients response to treatment, I feel that the patent would benefit from outpatient follow-up.    Final diagnoses:  Other fatigue    ED Discharge Orders     None          Neldon Hamp RAMAN, [place] 12/13/23 2130    Randol Simmonds, MD 12/16/23 [phone]

## 2023-12-12 NOTE — ED Triage Notes (Signed)
 Pt c/o CP, low energy since yesterday; it's just not getting better. Advises taking iron  pills & I felt a little better after that, but I've just been blah.

## 2023-12-19 DIAGNOSIS — Z981 Arthrodesis status: Secondary | ICD-10-CM | POA: Diagnosis not present

## 2023-12-19 DIAGNOSIS — M542 Cervicalgia: Secondary | ICD-10-CM | POA: Diagnosis not present

## 2023-12-20 ENCOUNTER — Other Ambulatory Visit: Payer: Self-pay

## 2023-12-20 ENCOUNTER — Encounter: Payer: Self-pay | Admitting: Student

## 2023-12-20 ENCOUNTER — Ambulatory Visit: Payer: Self-pay | Admitting: Student

## 2023-12-20 VITALS — BP 130/68 | HR 70 | Temp 97.5°F | Ht 65.0 in | Wt 250.0 lb

## 2023-12-20 DIAGNOSIS — R5383 Other fatigue: Secondary | ICD-10-CM

## 2023-12-20 NOTE — Patient Instructions (Signed)
 I'll check your vitamin B12 level today.  Remember to bring all of the medications that you take (including over the counter medications and supplements) with you to every clinic visit.  This after visit summary is an important review of tests, referrals, and medication changes that were discussed during your visit. If you have questions or concerns, call [phone]. Outside of clinic business hours, call the main hospital at [phone] and ask the operator for the on-call internal medicine resident.   Ozell Kung MD 12/20/2023, 4:12 PM

## 2023-12-20 NOTE — Progress Notes (Unsigned)
  Patient name: [name] Date of birth: 11-20-1970 Date of visit: 12/21/23  Subjective  Reason for visit: Follow-up (Patient follow up from ED visit for fatigue. Feeling much better - requesting blood work.)  Fatigue, low energy -Went to ED last Monday -Laboratory testing was normal or near normal -Feeling somewhat better now, almost back to normal -Wonders if she had some viral syndrome -No cough, sore throat, N/V/D, fever -Grandson had been ill week or so before onset    Outpatient Medications Prior to Visit  Medication Sig   amLODipine  (NORVASC ) 5 MG tablet Take 1 tablet (5 mg total) by mouth daily.   cyanocobalamin  1000 MCG tablet Take 1,000 mcg by mouth daily.   ferrous sulfate  325 (65 FE) MG tablet Take 1 tablet (325 mg total) by mouth every other day.   levothyroxine  (SYNTHROID ) 88 MCG tablet Take 1 tablet (88 mcg total) by mouth daily.   lidocaine  (LIDODERM ) 5 % Place 1 patch onto the skin daily. Remove & Discard patch within 12 hours or as directed by MD   metoprolol  tartrate (LOPRESSOR ) 25 MG tablet TAKE 1/2 TABLET(12.5 MG) BY MOUTH TWICE DAILY   pregabalin  (LYRICA ) 50 MG capsule Take 1 capsule (50 mg total) by mouth 3 (three) times daily.   Vitamin D , Ergocalciferol , (DRISDOL ) 1.25 MG (50000 UNIT) CAPS capsule Take 1 capsule (50,000 Units total) by mouth every 7 (seven) days.   No facility-administered medications prior to visit.     Objective  Today's Vitals   12/20/23 1515  BP: 130/68  Pulse: 70  Temp: (!) 97.5 F (36.4 C)  TempSrc: Oral  SpO2: 99%  Weight: 250 lb (113.4 kg)  Height: 5' 5 (1.651 m)  PainSc: 0-No pain  Body mass index is 41.6 kg/m.   Physical Exam Constitutional:      Appearance: Normal appearance.  Cardiovascular:     Rate and Rhythm: Normal rate and regular rhythm.  Pulmonary:     Effort: Pulmonary effort is normal. No respiratory distress.  Lymphadenopathy:     Cervical: No cervical adenopathy.  Skin:    General: Skin is  warm and dry.  Neurological:     Mental Status: She is alert.     Cranial Nerves: No facial asymmetry.  Psychiatric:        Mood and Affect: Affect normal.        Speech: Speech normal.        Behavior: Behavior normal.     Assessment & Plan Fatigue, unspecified type Acute, now resolved. Query viral syndrome as she had exposure to sick child at home recently. B12 normal, checked for history of pernicious anemia. Return to clinic as needed if symptoms return. Orders:   Vitamin B12  Return if symptoms worsen or fail to improve.  Ozell Kung MD 12/21/2023, 4:48 PM

## 2023-12-21 ENCOUNTER — Ambulatory Visit: Payer: Self-pay | Admitting: Student

## 2023-12-21 LAB — VITAMIN B12: Vitamin B-12: 1732 pg/mL — ABNORMAL HIGH (ref 232–1245)

## 2023-12-29 NOTE — Progress Notes (Signed)
 Internal Medicine Clinic Attending  Case discussed with the resident at the time of the visit.  We reviewed the resident's history and exam and pertinent patient test results.  I agree with the assessment, diagnosis, and plan of care documented in the resident's note.

## 2024-01-05 DIAGNOSIS — M542 Cervicalgia: Secondary | ICD-10-CM | POA: Diagnosis not present

## 2024-01-05 DIAGNOSIS — Z981 Arthrodesis status: Secondary | ICD-10-CM | POA: Diagnosis not present
